# Patient Record
Sex: Male | Born: 1955
Health system: Southern US, Community
[De-identification: ages and names within clinical notes are randomized; demographics above are authoritative.]

## PROBLEM LIST (undated history)

## (undated) DIAGNOSIS — G4730 Sleep apnea, unspecified: Secondary | ICD-10-CM

## (undated) DIAGNOSIS — I2510 Atherosclerotic heart disease of native coronary artery without angina pectoris: Secondary | ICD-10-CM

## (undated) DIAGNOSIS — I10 Essential (primary) hypertension: Secondary | ICD-10-CM

## (undated) DIAGNOSIS — E119 Type 2 diabetes mellitus without complications: Secondary | ICD-10-CM

## (undated) DIAGNOSIS — Z860100 Personal history of colon polyps, unspecified: Secondary | ICD-10-CM

## (undated) DIAGNOSIS — I5020 Unspecified systolic (congestive) heart failure: Secondary | ICD-10-CM

## (undated) DIAGNOSIS — N529 Male erectile dysfunction, unspecified: Secondary | ICD-10-CM

## (undated) DIAGNOSIS — C801 Malignant (primary) neoplasm, unspecified: Secondary | ICD-10-CM

## (undated) DIAGNOSIS — I739 Peripheral vascular disease, unspecified: Secondary | ICD-10-CM

## (undated) HISTORY — PX: SHOULDER SURGERY: SHX246

## (undated) HISTORY — DX: Essential (primary) hypertension: I10

## (undated) HISTORY — DX: Type 2 diabetes mellitus without complications: E11.9

## (undated) HISTORY — DX: Peripheral vascular disease, unspecified: I73.9

## (undated) HISTORY — DX: Unspecified systolic (congestive) heart failure: I50.20

## (undated) HISTORY — DX: Personal history of colonic polyps: Z86.010

## (undated) HISTORY — DX: Male erectile dysfunction, unspecified: N52.9

## (undated) HISTORY — DX: Personal history of colon polyps, unspecified: Z86.0100

## (undated) HISTORY — DX: Atherosclerotic heart disease of native coronary artery without angina pectoris: I25.10

---

## 1987-04-01 HISTORY — PX: BACK SURGERY: SHX140

## 2006-03-31 DIAGNOSIS — I10 Essential (primary) hypertension: Secondary | ICD-10-CM

## 2006-03-31 HISTORY — DX: Essential (primary) hypertension: I10

## 2008-03-31 DIAGNOSIS — E118 Type 2 diabetes mellitus with unspecified complications: Secondary | ICD-10-CM

## 2008-03-31 DIAGNOSIS — E119 Type 2 diabetes mellitus without complications: Secondary | ICD-10-CM

## 2008-03-31 HISTORY — DX: Type 2 diabetes mellitus without complications: E11.9

## 2008-03-31 HISTORY — DX: Type 2 diabetes mellitus with unspecified complications: E11.8

## 2009-01-10 ENCOUNTER — Ambulatory Visit: Payer: Self-pay | Admitting: Internal Medicine

## 2011-04-01 HISTORY — PX: COLONOSCOPY: SHX174

## 2011-08-20 ENCOUNTER — Ambulatory Visit: Payer: Self-pay | Admitting: General Surgery

## 2011-08-21 LAB — PATHOLOGY REPORT

## 2012-01-13 ENCOUNTER — Ambulatory Visit: Payer: Self-pay | Admitting: Internal Medicine

## 2012-01-30 ENCOUNTER — Ambulatory Visit: Payer: Self-pay | Admitting: Internal Medicine

## 2012-02-29 ENCOUNTER — Ambulatory Visit: Payer: Self-pay | Admitting: Internal Medicine

## 2012-03-31 ENCOUNTER — Ambulatory Visit: Payer: Self-pay | Admitting: Internal Medicine

## 2012-10-14 ENCOUNTER — Encounter: Payer: Self-pay | Admitting: *Deleted

## 2014-07-19 ENCOUNTER — Ambulatory Visit
Admit: 2014-07-19 | Disposition: A | Discharge status: Home / Self Care | Payer: Self-pay | Attending: Family Medicine | Admitting: Family Medicine

## 2014-07-19 LAB — URINALYSIS, COMPLETE
BILIRUBIN, UR: NEGATIVE
BLOOD: NEGATIVE
Bacteria: NEGATIVE
Glucose,UR: 500
Ketone: NEGATIVE
Leukocyte Esterase: NEGATIVE
Nitrite: NEGATIVE
Ph: 5.5 (ref 5.0–8.0)
Protein: NEGATIVE
RBC,UR: NONE SEEN /HPF (ref 0–5)
SQUAMOUS EPITHELIAL: NONE SEEN
Specific Gravity: 1.015 (ref 1.000–1.030)

## 2014-07-19 IMAGING — CR BILATERAL SACROILIAC JOINTS - 3+ VIEW
3 series · 3 of 3 positions shown · non-contrast
Comparison: None.

CLINICAL DATA: Right posterior pelvic pain for 6 weeks, no injury

EXAM:
BILATERAL SACROILIAC JOINTS - 3+ VIEW

[si joints ap]
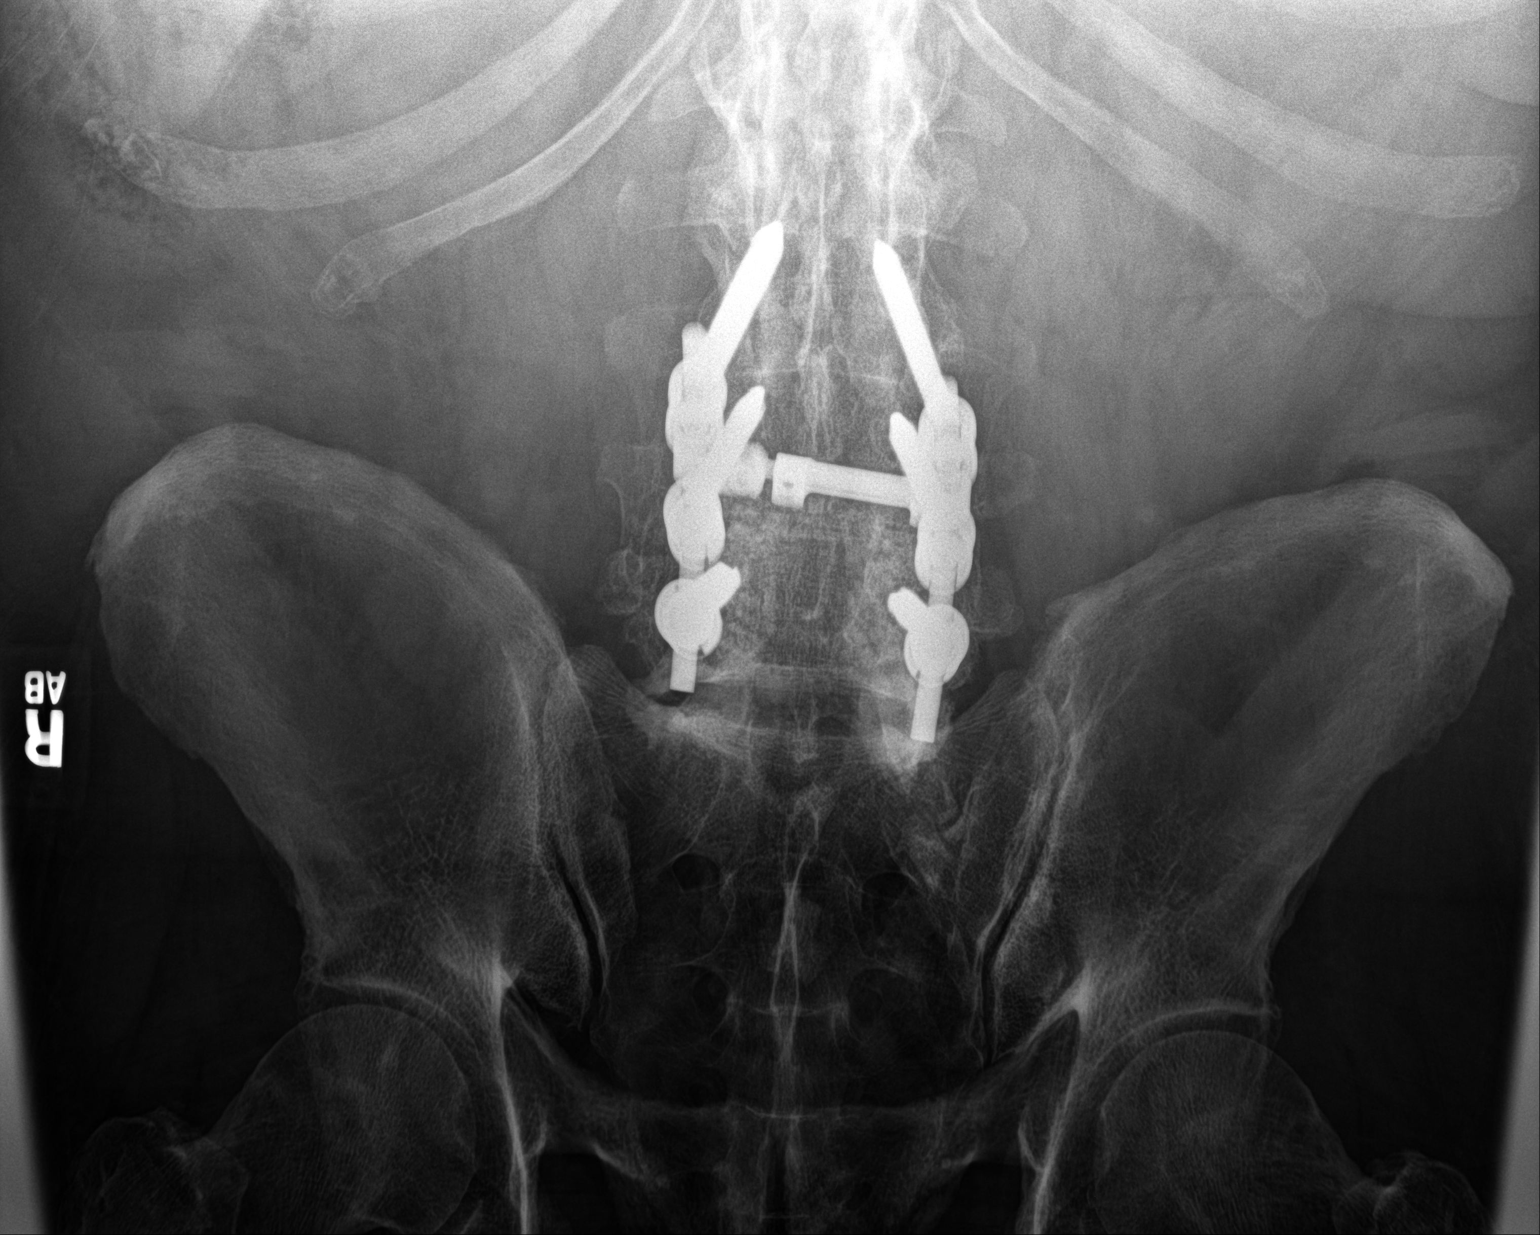

[si joints obli (1 of 2)]
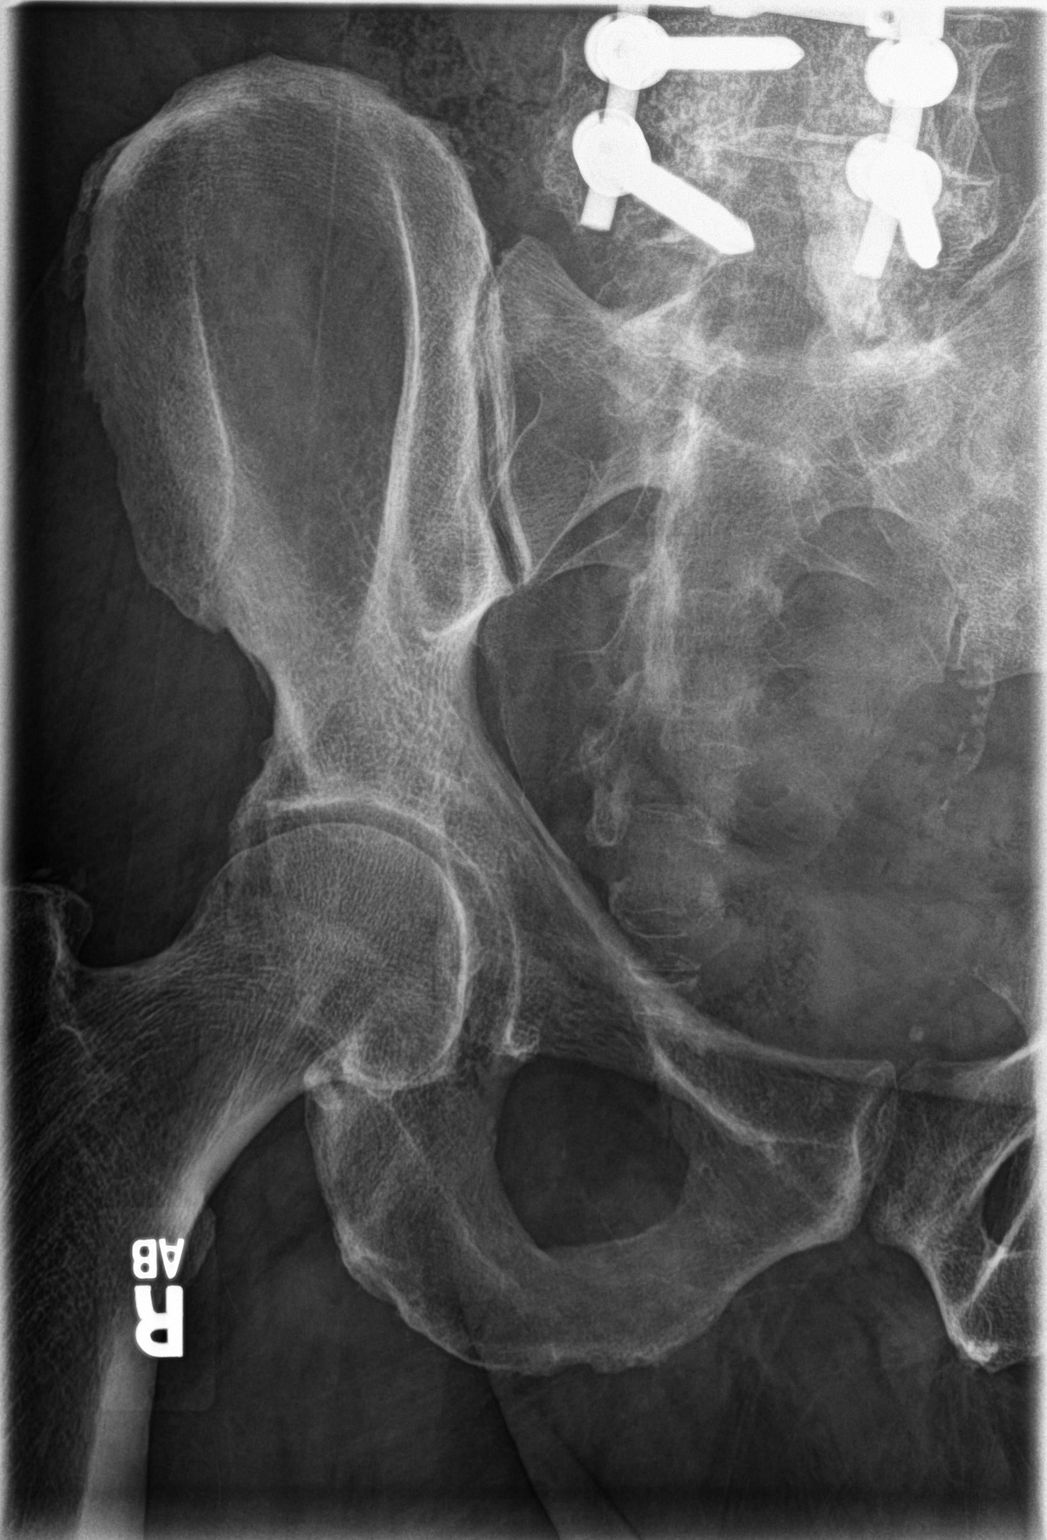

[si joints obli (2 of 2)]
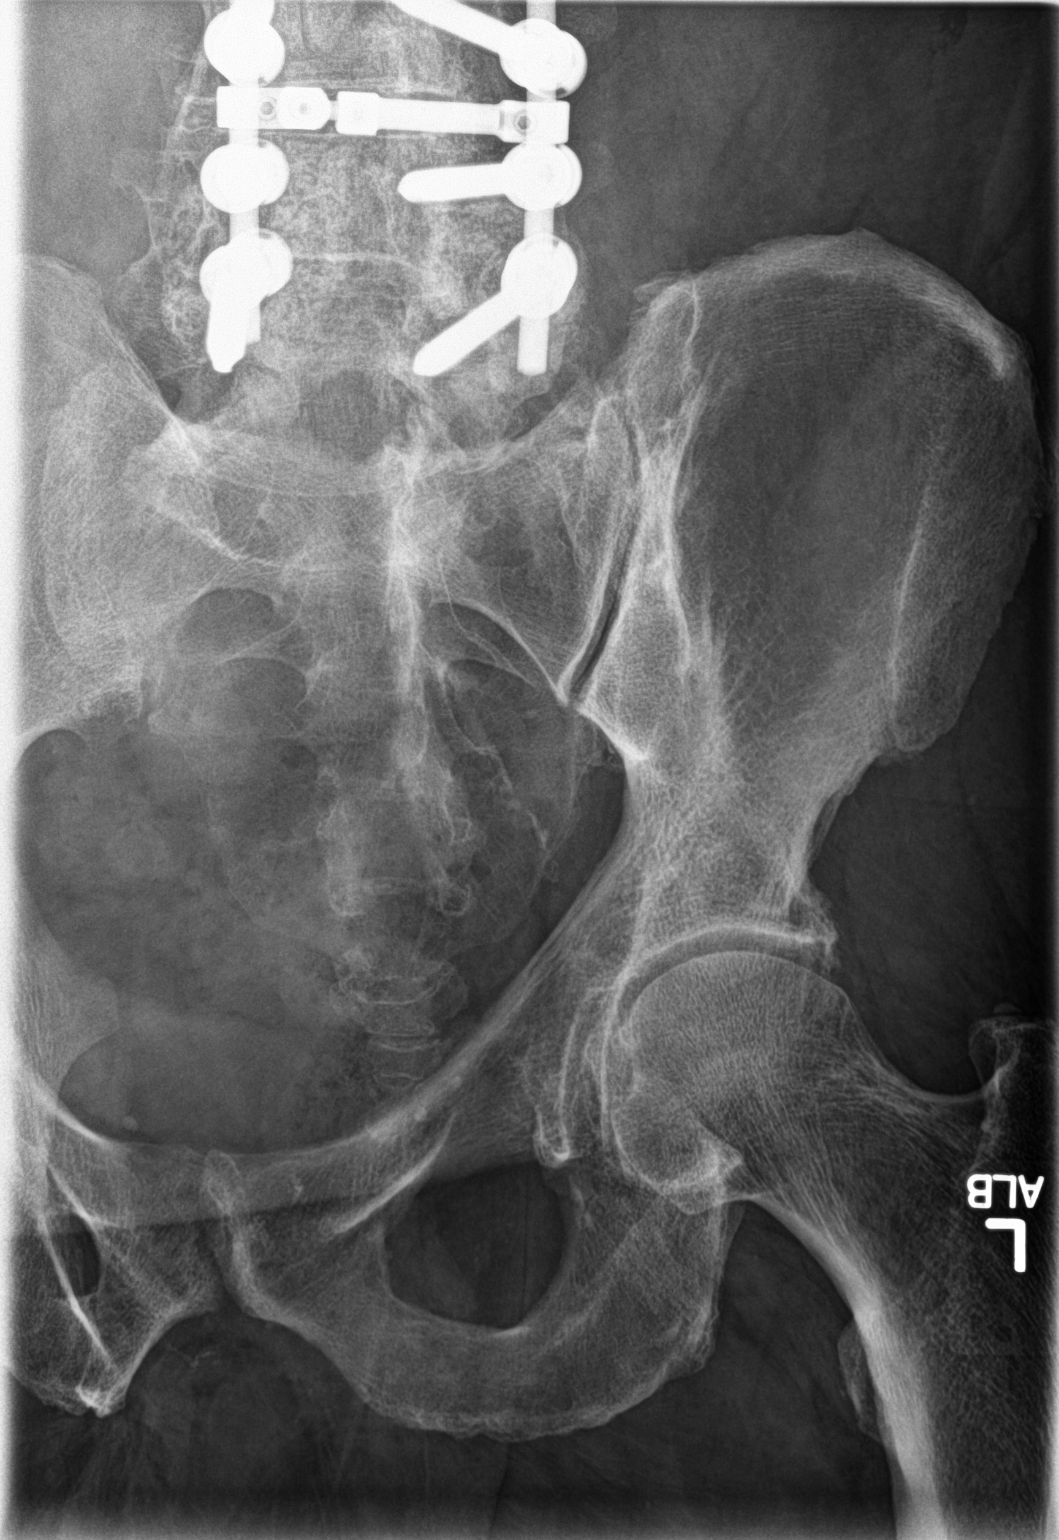

[3 of 3 positions shown; findings below may reference images not displayed]

FINDINGS: Views of the SI joints show no evidence of sacroiliitis. Minimal
degenerative changes present with some vacuum disc phenomenon. The
sacral foramina appear corticated. No acute abnormality is seen.
Hardware for posterior fusion of L3-L5 is noted.
IMPRESSION: No evidence of sacroiliitis. Mild degenerative change of the SI
joints.

## 2014-07-19 IMAGING — CR PELVIS - 1-2 VIEW
1 series · 1 of 1 positions shown · non-contrast
Comparison: None.

CLINICAL DATA: Right posterior pelvis pain for 6 weeks common no
known injury, initial encounter

EXAM:
PELVIS - 1-2 VIEW

[pelvis ap]
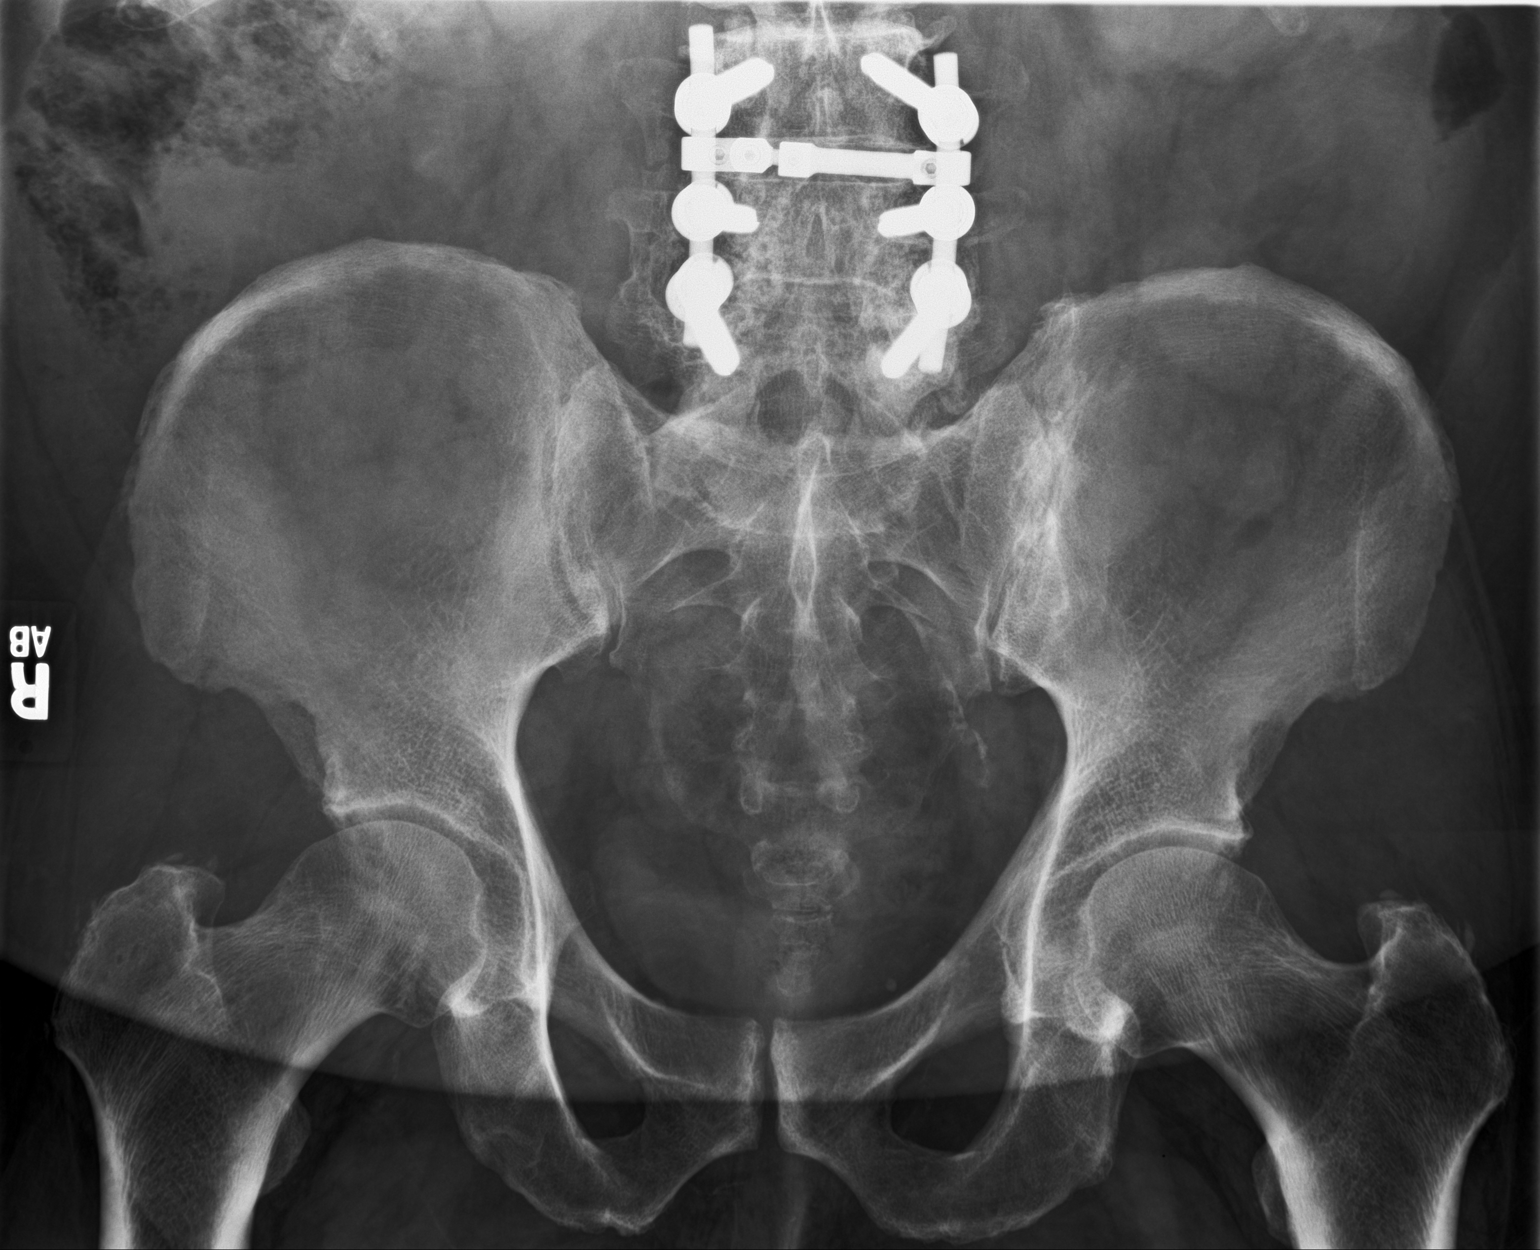

[1 of 1 positions shown; findings below may reference images not displayed]

FINDINGS: Pelvic ring is intact. Postsurgical changes are noted in the lower
lumbar spine. No acute fracture or dislocation is noted.
IMPRESSION: No acute abnormality noted.

## 2014-07-26 ENCOUNTER — Ambulatory Visit
Admit: 2014-07-26 | Disposition: A | Discharge status: Home / Self Care | Payer: Self-pay | Attending: Internal Medicine | Admitting: Internal Medicine

## 2014-07-26 IMAGING — US ABDOMEN ULTRASOUND
1 series · 13 of 25 positions shown · non-contrast
Comparison: None.

CLINICAL DATA: Six week history of right-sided abdominal
discomfort; no history of abdominal surgery.

EXAM:
ULTRASOUND ABDOMEN COMPLETE

[Series 1: abdomen ultrasound · 0.31mm/px · 13 of 174 slices shown]
[im 1/174]
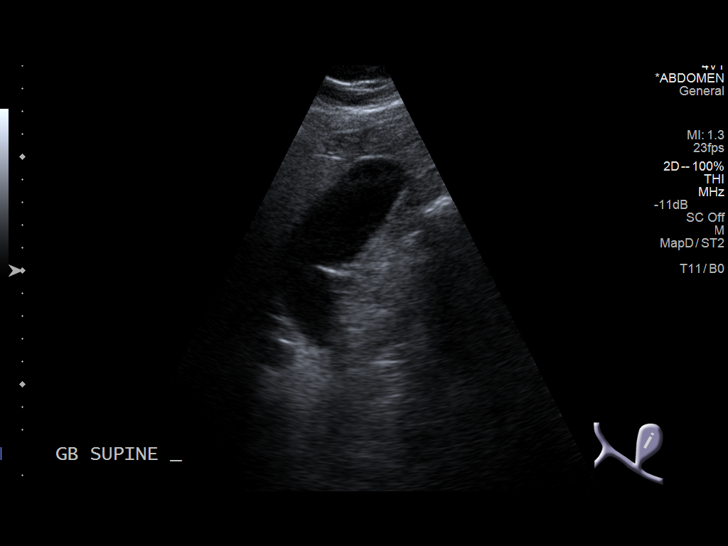
[im 15/174]
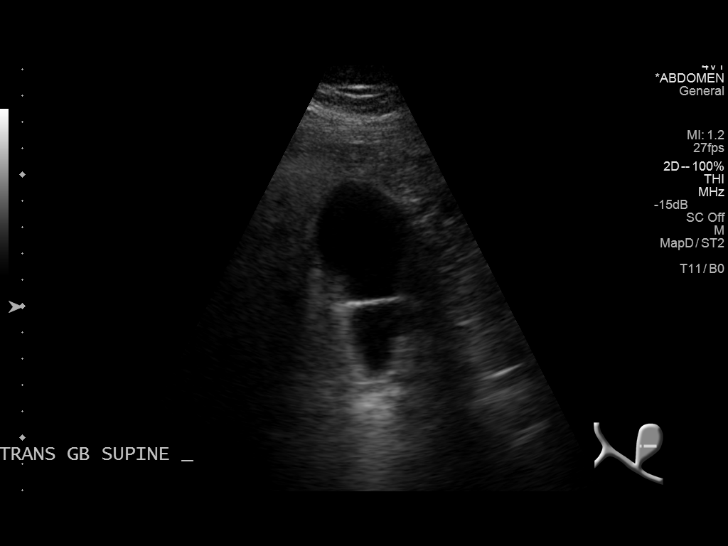
[im 29/174]
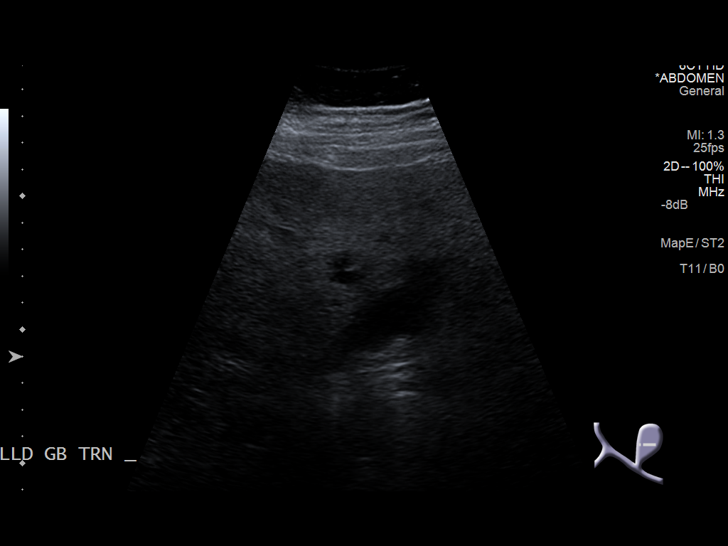
[im 44/174]
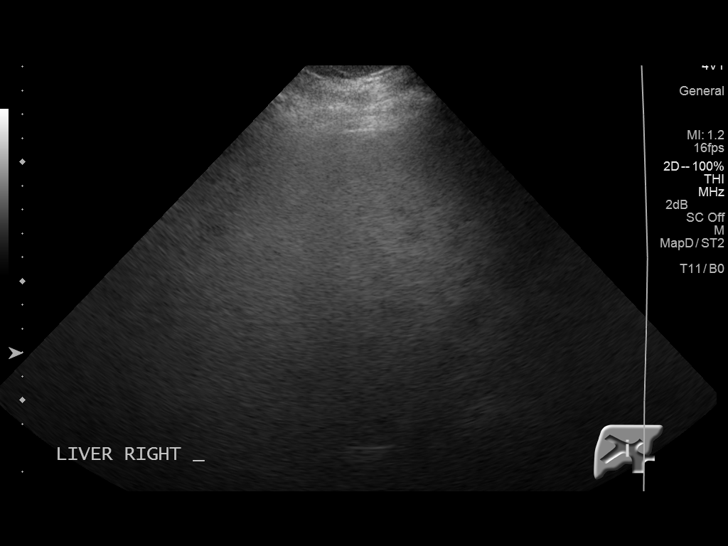
[im 58/174]
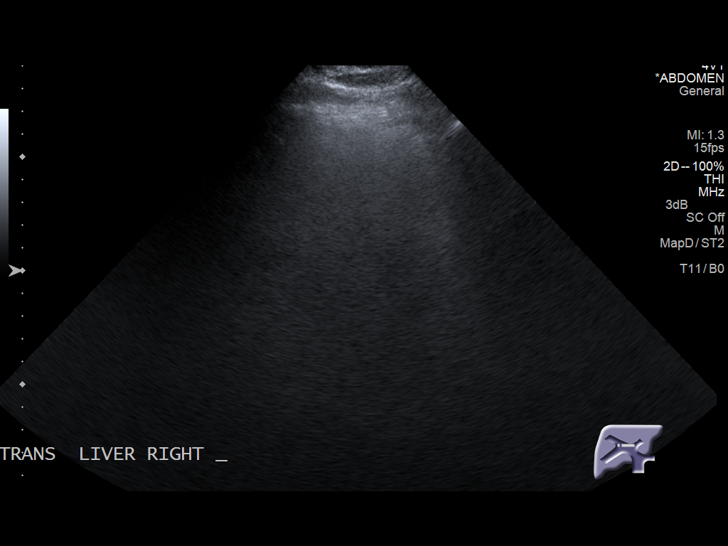
[im 73/174]
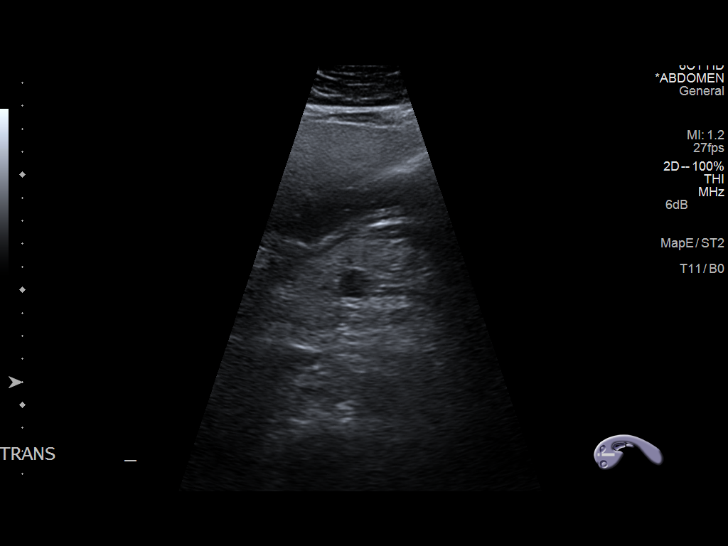
[im 87/174]
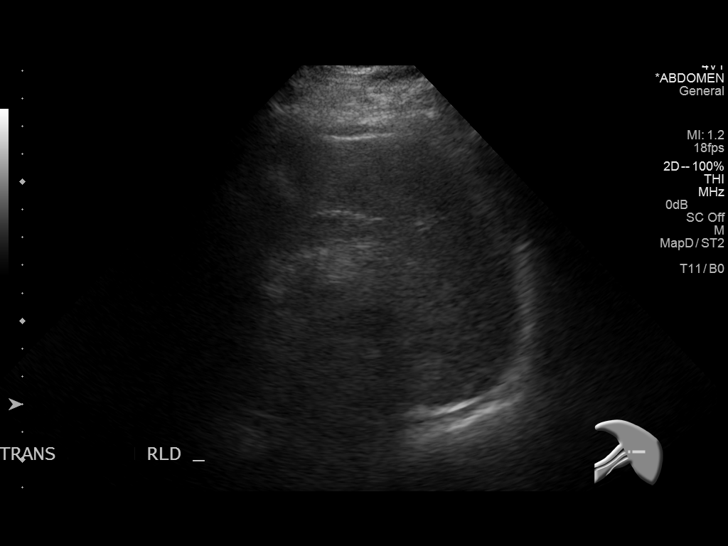
[im 101/174]
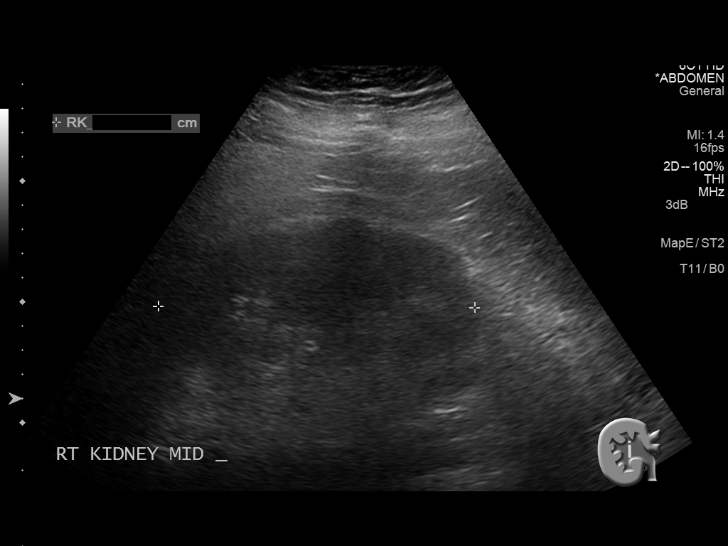
[im 116/174]
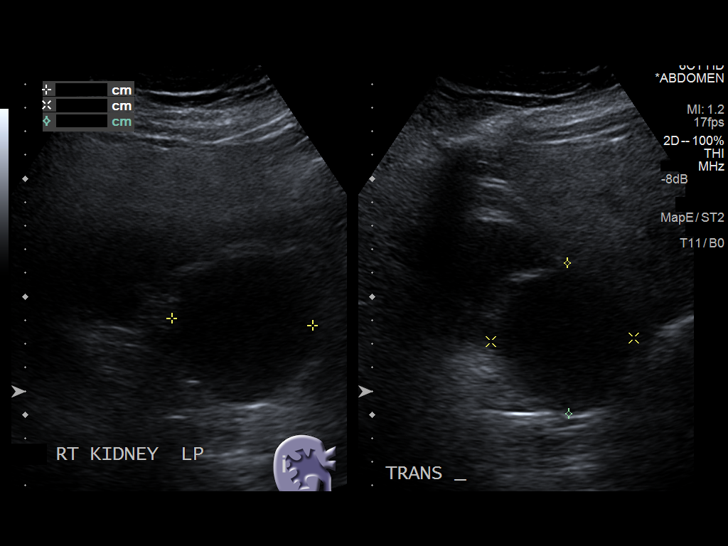
[im 130/174]
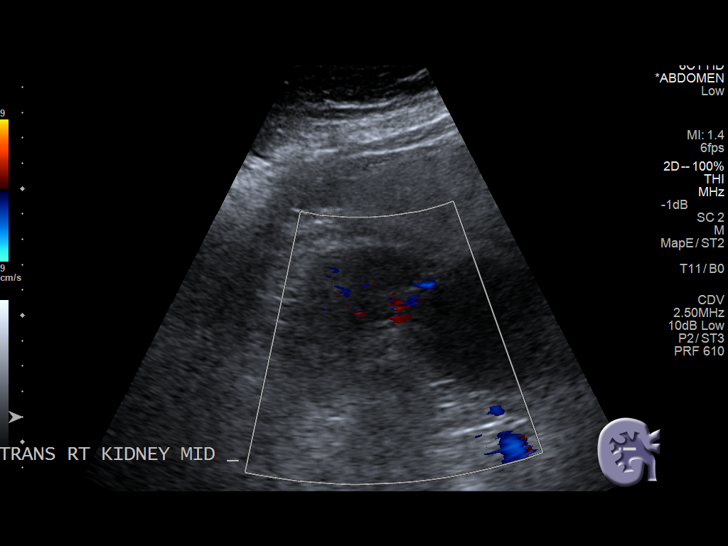
[im 145/174]
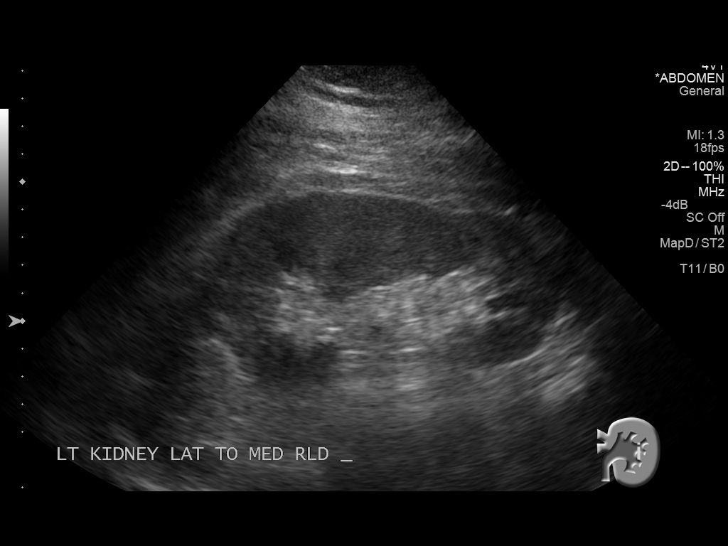
[im 159/174]
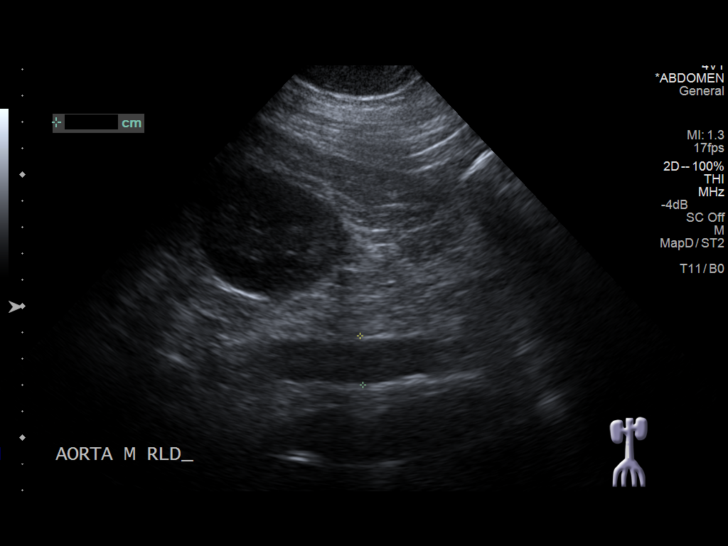
[im 174/174]
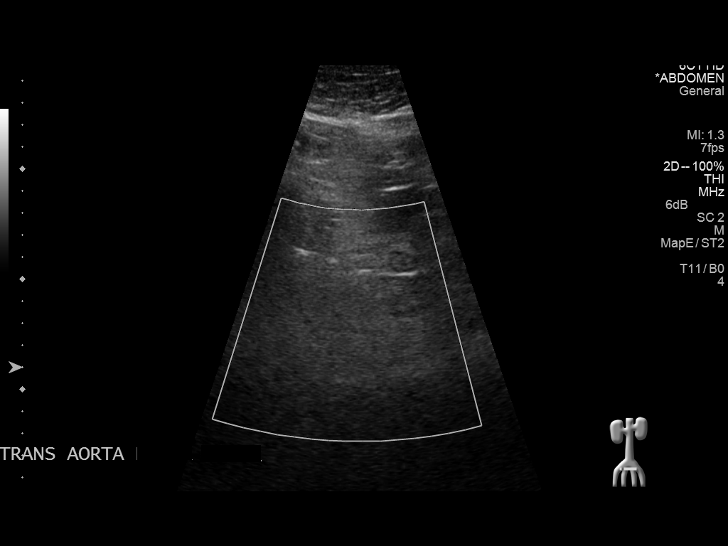

[13 of 25 positions shown; findings below may reference images not displayed]

FINDINGS: Gallbladder: No gallstones or wall thickening visualized. No
sonographic Murphy sign noted.

Common bile duct: Diameter: 5.4 mm

Liver: The hepatic echotexture is mildly increased. Is the organ is
difficult to penetrate with the ultrasound beam. No definite mass or
ductal dilation is demonstrated.

IVC: The inferior vena cava was obscured by bowel gas.

Pancreas: The pancreas exhibits normal echotexture with no focal
mass or ductal dilation.

Spleen: Size and appearance within normal limits.

Right Kidney: Length: 13.1 cm. The renal cortical echotexture is
normal. There is a 6.4 cm simple appearing cyst exophytic from the
lower pole.

Left Kidney: Length: 12.2 cm. Echogenicity within normal limits. No
mass or hydronephrosis visualized.

Abdominal aorta: No aneurysm visualized.

Other findings: There is no ascites.
IMPRESSION: 1. No acute hepatobiliary abnormality is demonstrated. Evaluation of
the liver is limited however due to its dense echotexture and the
patient's body habitus.
2. Simple appearing 6.4 cm exophytic lower pole cyst on the right.
No acute renal abnormality is demonstrated.

## 2014-08-04 ENCOUNTER — Other Ambulatory Visit: Payer: Self-pay | Admitting: Internal Medicine

## 2014-08-04 DIAGNOSIS — R109 Unspecified abdominal pain: Secondary | ICD-10-CM

## 2014-08-11 ENCOUNTER — Ambulatory Visit
Admission: RE | Admit: 2014-08-11 | Discharge: 2014-08-11 | Disposition: A | Discharge status: Home / Self Care | Payer: BLUE CROSS/BLUE SHIELD | Source: Ambulatory Visit | Attending: Internal Medicine | Admitting: Internal Medicine

## 2014-08-11 DIAGNOSIS — R109 Unspecified abdominal pain: Secondary | ICD-10-CM | POA: Diagnosis present

## 2014-08-11 DIAGNOSIS — K5730 Diverticulosis of large intestine without perforation or abscess without bleeding: Secondary | ICD-10-CM | POA: Insufficient documentation

## 2014-08-11 DIAGNOSIS — D1779 Benign lipomatous neoplasm of other sites: Secondary | ICD-10-CM | POA: Insufficient documentation

## 2014-08-11 IMAGING — CT CT ABD-PEL WO/W CM
1 of 4 series · 11 of 32 positions shown, 17 images · IV contrast (omnipaque)
Comparison: None

CLINICAL DATA: RIGHT-side abdominal pain, history diabetes
mellitus, hypertension, former smoker

EXAM:
CT ABDOMEN AND PELVIS WITHOUT AND WITH CONTRAST
TECHNIQUE: Multidetector CT imaging of the abdomen and pelvis was performed
following the standard protocol before and following the bolus
administration of intravenous contrast. Sagittal and coronal MPR
images reconstructed from axial data set.
CONTRAST:  100mL OMNIPAQUE IOHEXOL 300 MG/ML SOLN IV. Dilute oral
contrast.

[Series 2: routine abd pel without · axial · non-contrast · 0.85mm/px · z∈[+331,+731]mm · 11 of 98 slices shown, 17 images]
[im 9/98  soft-tissue]
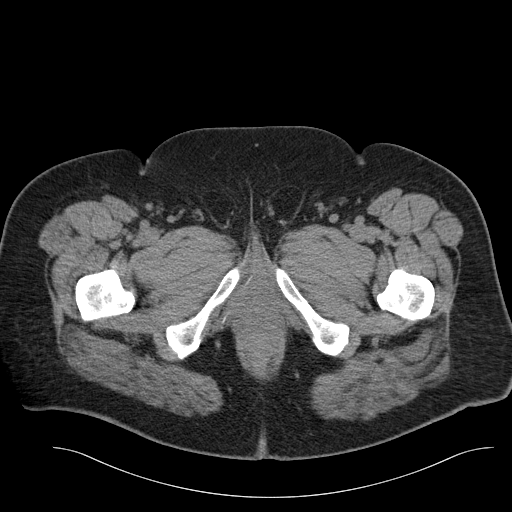
[im 9/98  bone]
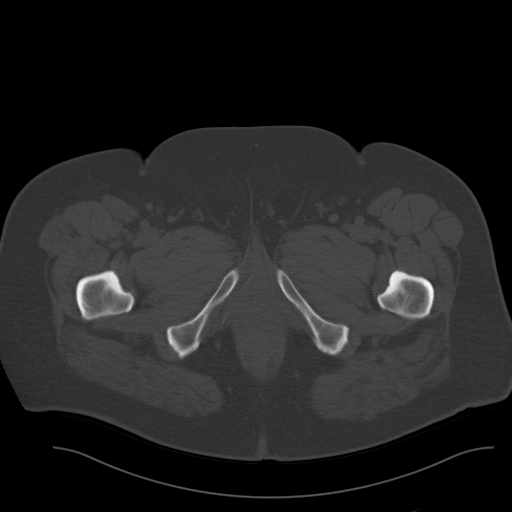
[im 17/98  soft-tissue]
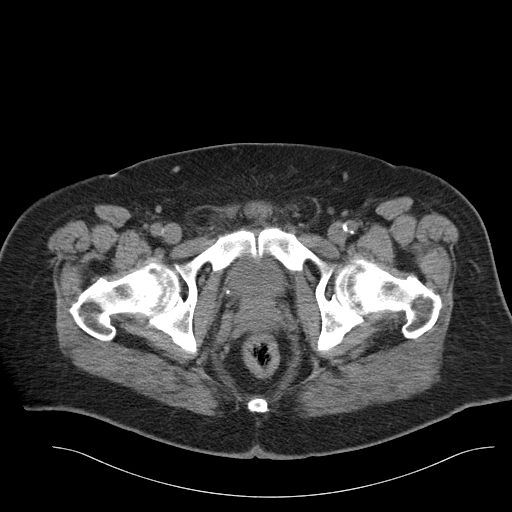
[im 25/98  soft-tissue]
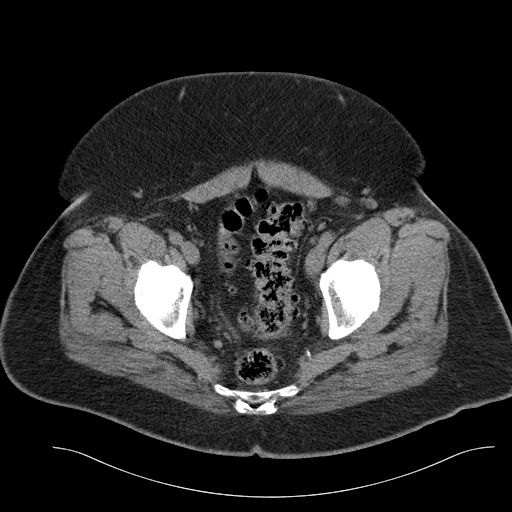
[im 33/98  soft-tissue]
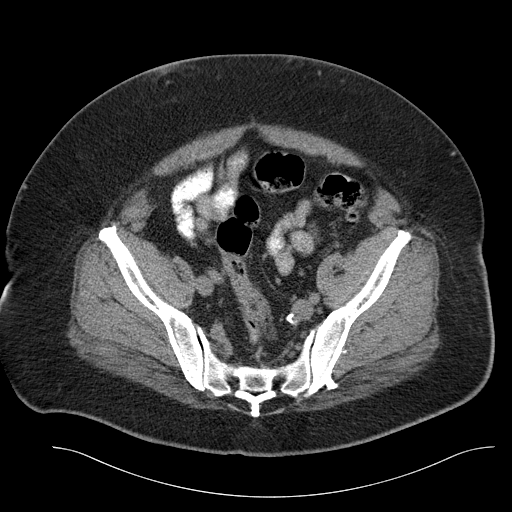
[im 41/98  soft-tissue]
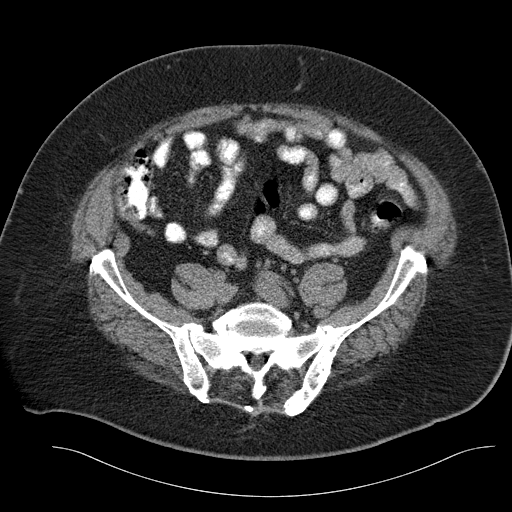
[im 49/98  soft-tissue]
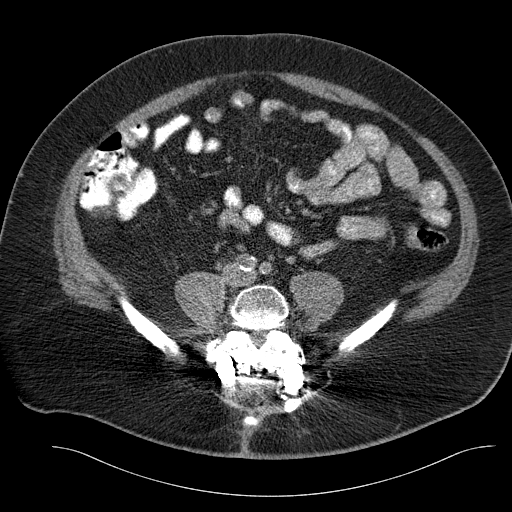
[im 57/98  soft-tissue]
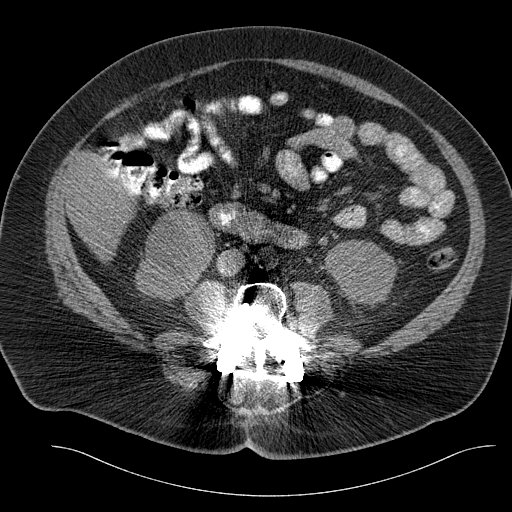
[im 65/98  soft-tissue]
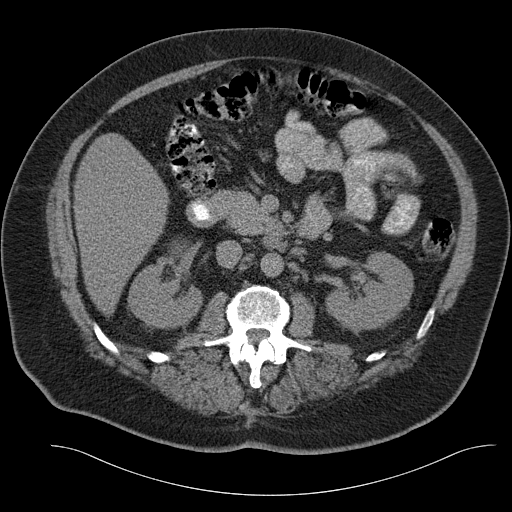
[im 65/98  lung]
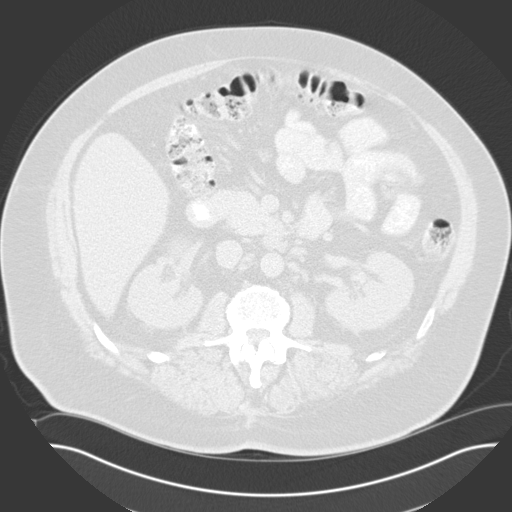
[im 73/98  soft-tissue]
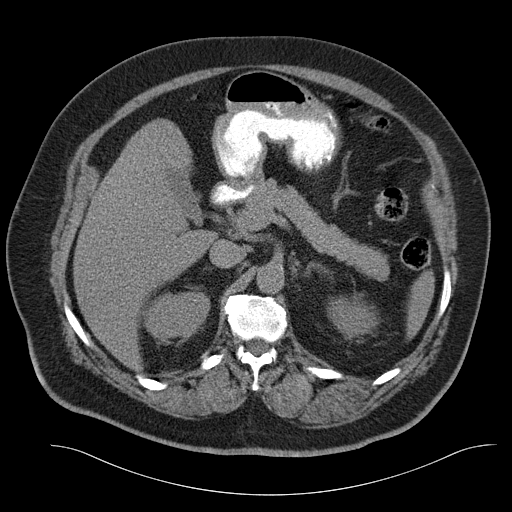
[im 73/98  lung]
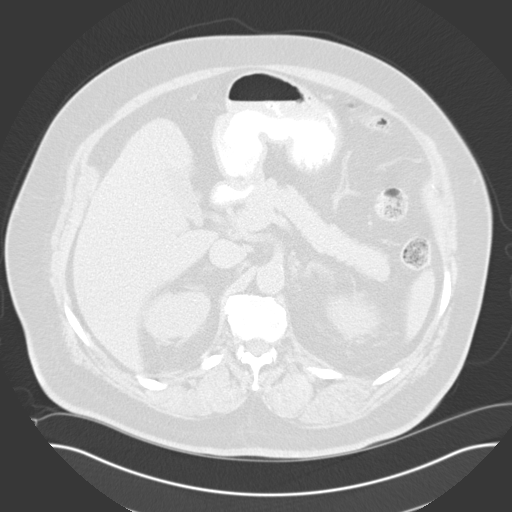
[im 73/98  bone]
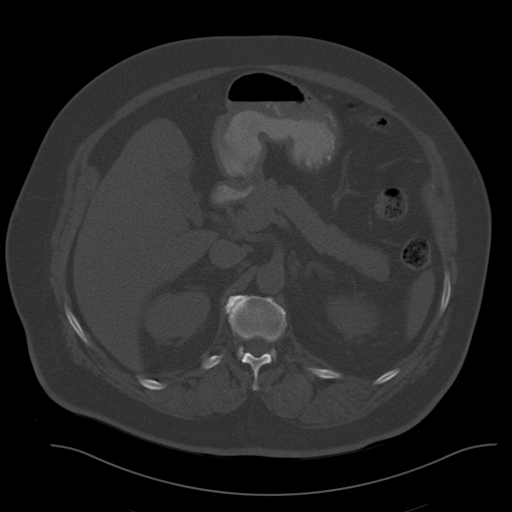
[im 81/98  soft-tissue]
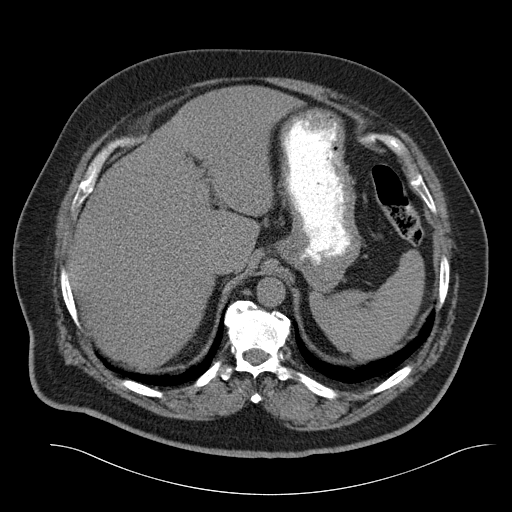
[im 81/98  lung]
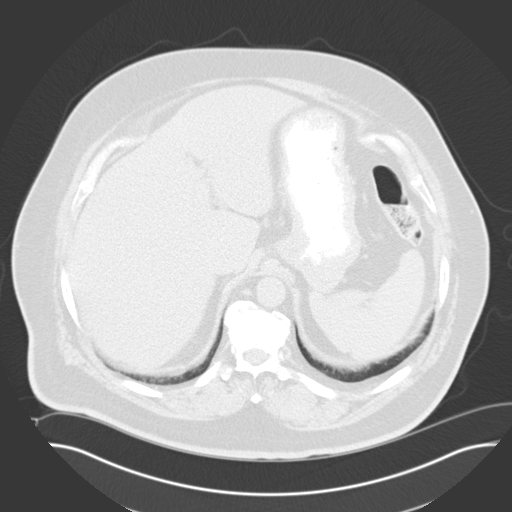
[im 89/98  soft-tissue]
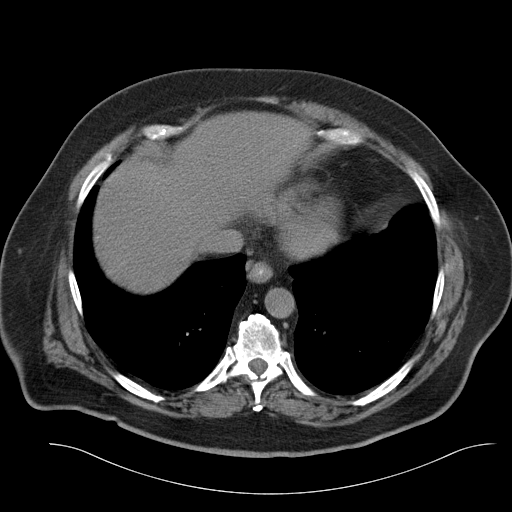
[im 89/98  lung]
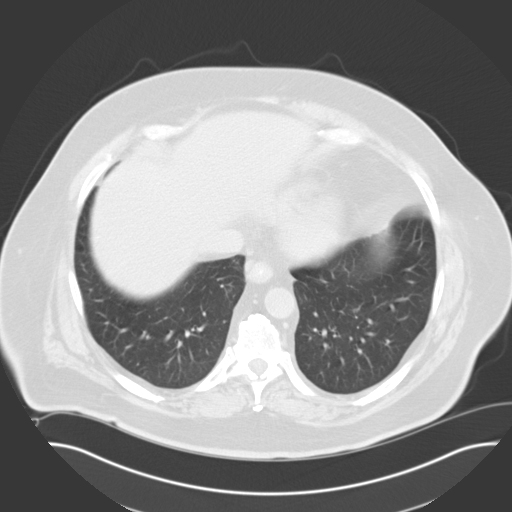

[11 of 32 positions shown; findings below may reference images not displayed]

FINDINGS: Lung bases clear.

Beam hardening artifacts from pedicle screws and bars at L3-L5.

Simple appearing cyst lower pole RIGHT kidney 6.2 x 6.2 x 6.0 cm.

Questionable tiny cyst inferior pole LEFT kidney series 7, image 27.

No urinary tract calcification or dilatation.

Small fat attenuation nodule LEFT adrenal gland consistent with
myelolipoma 12 x 11 x 8 mm.

Liver, gallbladder, spleen, pancreas, kidneys, and adrenal glands
otherwise normal.

Normal appendix.

Sigmoid diverticulosis without evidence of diverticulitis.

Stomach and bowel loops otherwise normal appearance.

Scattered atherosclerotic calcification.

No addition mass, adenopathy, free fluid, or free air.

Bladder and ureters unremarkable.

Probable small BILATERAL inguinal hernias containing fat.

Deformity of the posterior LEFT ilium question bone graft donor
site.

No acute osseous findings.
IMPRESSION: Small LEFT adrenal myelolipoma.

Probable BILATERAL renal cysts.

Sigmoid diverticulosis.

Probable BILATERAL small inguinal hernias containing fat.

No acute intra-abdominal or intrapelvic abnormalities.

## 2014-08-11 MED ORDER — IOHEXOL 300 MG/ML  SOLN
100.0000 mL | Freq: Once | INTRAMUSCULAR | Status: AC | PRN
  Administered 2014-08-11: 100 mL via INTRAVENOUS

## 2015-03-28 ENCOUNTER — Ambulatory Visit
Admission: RE | Admit: 2015-03-28 | Discharge: 2015-03-28 | Disposition: A | Discharge status: Home / Self Care | Payer: BLUE CROSS/BLUE SHIELD | Source: Ambulatory Visit | Attending: Internal Medicine | Admitting: Internal Medicine

## 2015-03-28 ENCOUNTER — Other Ambulatory Visit: Payer: Self-pay | Admitting: Internal Medicine

## 2015-03-28 DIAGNOSIS — J181 Lobar pneumonia, unspecified organism: Principal | ICD-10-CM

## 2015-03-28 DIAGNOSIS — J189 Pneumonia, unspecified organism: Secondary | ICD-10-CM | POA: Insufficient documentation

## 2015-03-28 IMAGING — CR DG CHEST 2V
1 series · 2 of 2 positions shown · non-contrast
Comparison: None.

CLINICAL DATA: Chest pressure.

EXAM:
CHEST  2 VIEW

[Series 1: pa · 0.17mm/px · 2 of 2 slices shown]
[im 1/2]
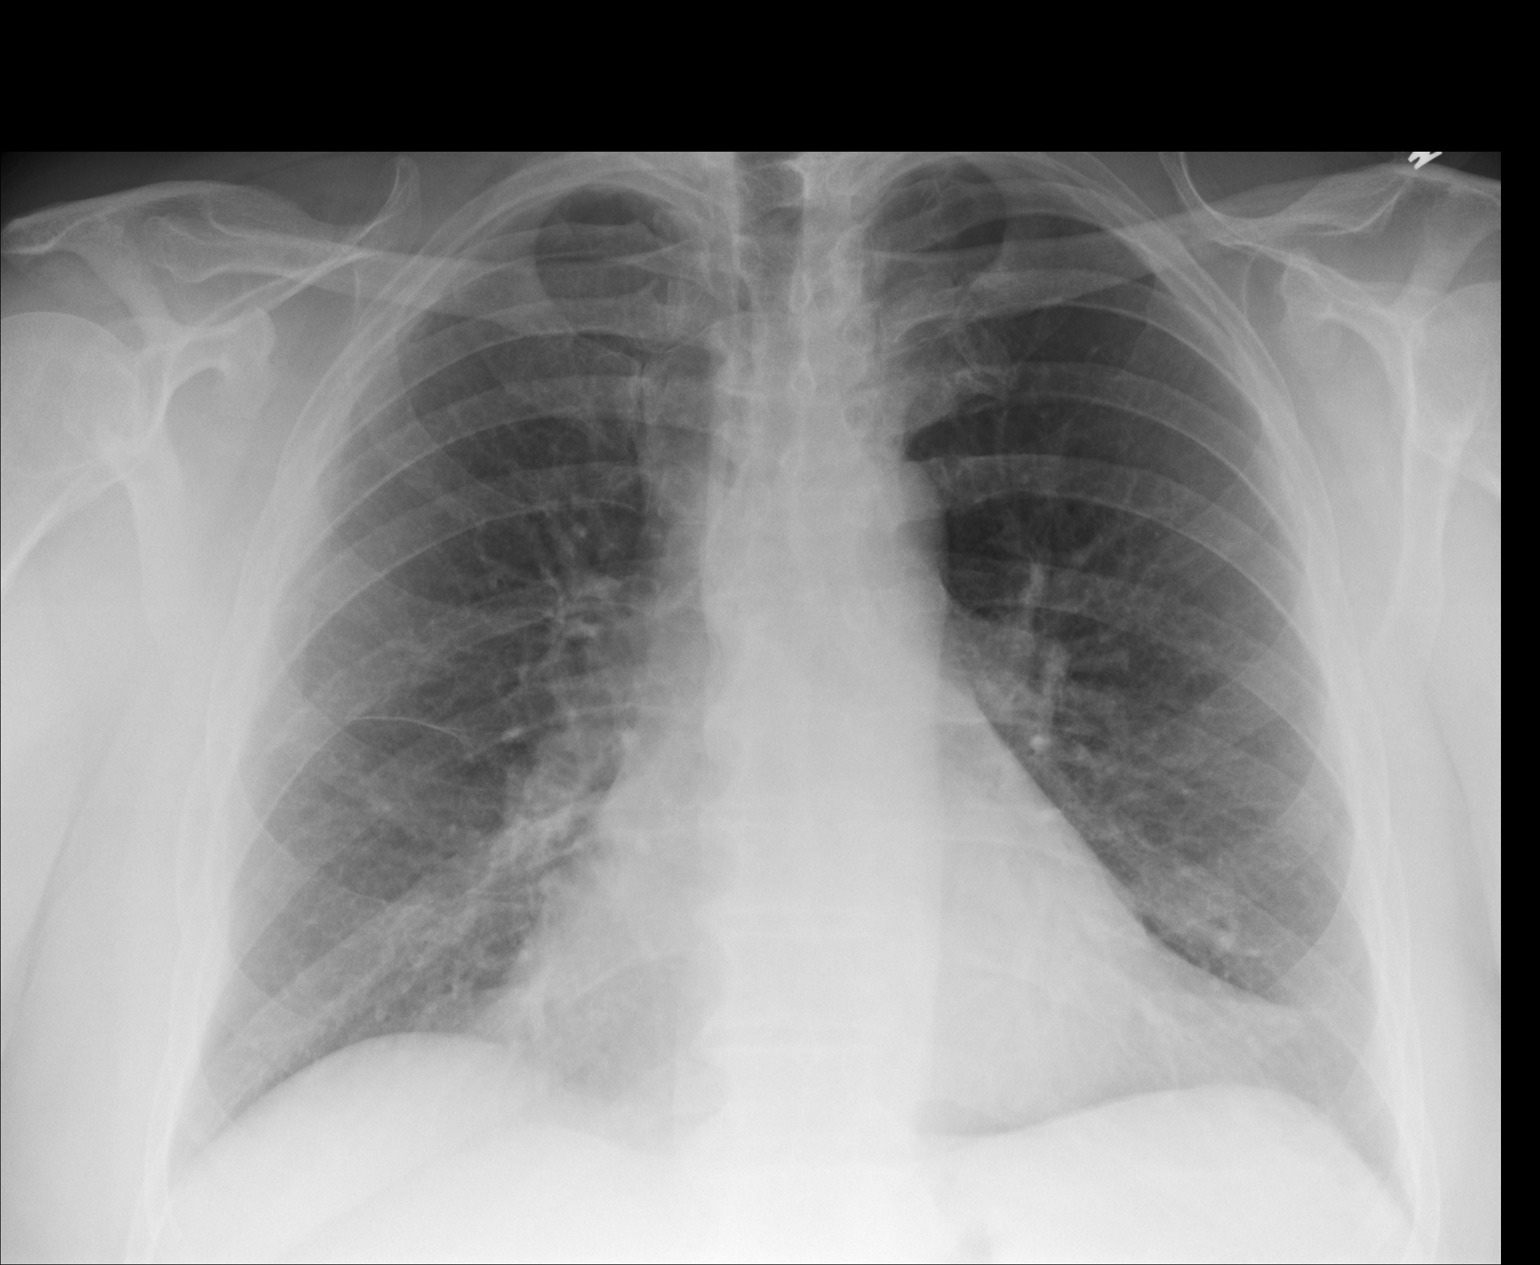
[im 2/2]
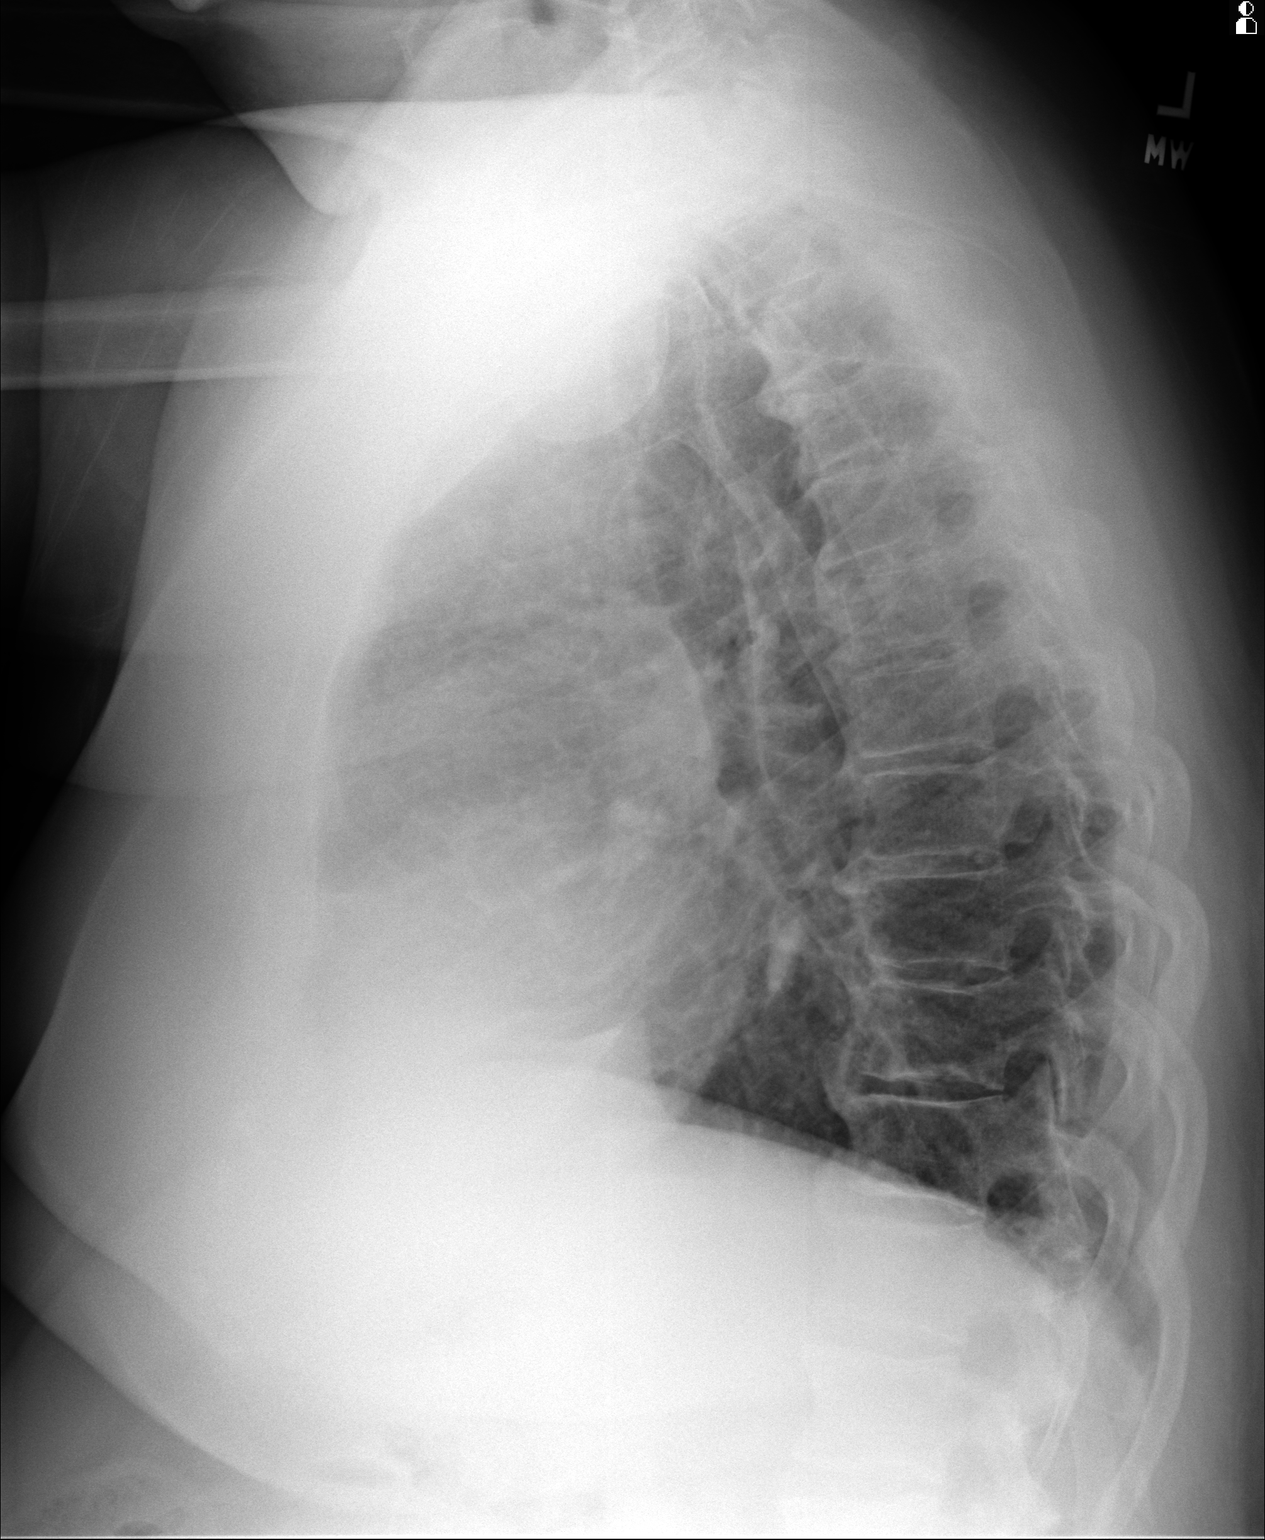

[2 of 2 positions shown; findings below may reference images not displayed]

FINDINGS: Mediastinum and hilar structures normal. Low lung volumes with
bibasilar atelectasis. No pleural effusion or pneumothorax.
Cardiomegaly with normal pulmonary vascularity. No acute bony
abnormality .
IMPRESSION: 1.  Low lung volumes with bibasilar atelectasis .

2.  Cardiomegaly with normal pulmonary vascularity.

## 2016-02-20 ENCOUNTER — Ambulatory Visit
Admission: RE | Admit: 2016-02-20 | Discharge: 2016-02-20 | Disposition: A | Discharge status: Home / Self Care | Payer: 59 | Source: Ambulatory Visit | Attending: Internal Medicine | Admitting: Internal Medicine

## 2016-02-20 ENCOUNTER — Other Ambulatory Visit: Payer: Self-pay | Admitting: Internal Medicine

## 2016-02-20 DIAGNOSIS — M5136 Other intervertebral disc degeneration, lumbar region: Secondary | ICD-10-CM | POA: Insufficient documentation

## 2016-02-20 IMAGING — DX DG LUMBAR SPINE COMPLETE 4+V
6 series · 6 of 6 positions shown · non-contrast
Comparison: [DATE]

CLINICAL DATA: Degenerative disc disease, low back pain for years

EXAM:
LUMBAR SPINE - COMPLETE 4+ VIEW

[l-spine ap]
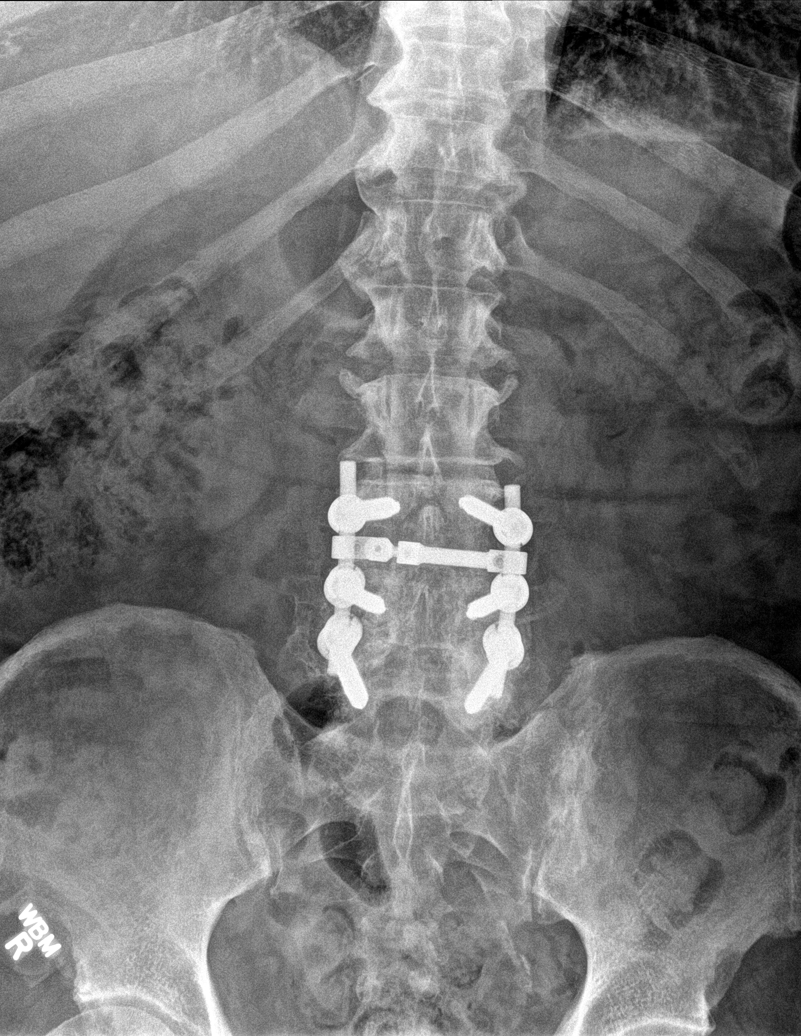

[l-spine obl (1 of 3)]
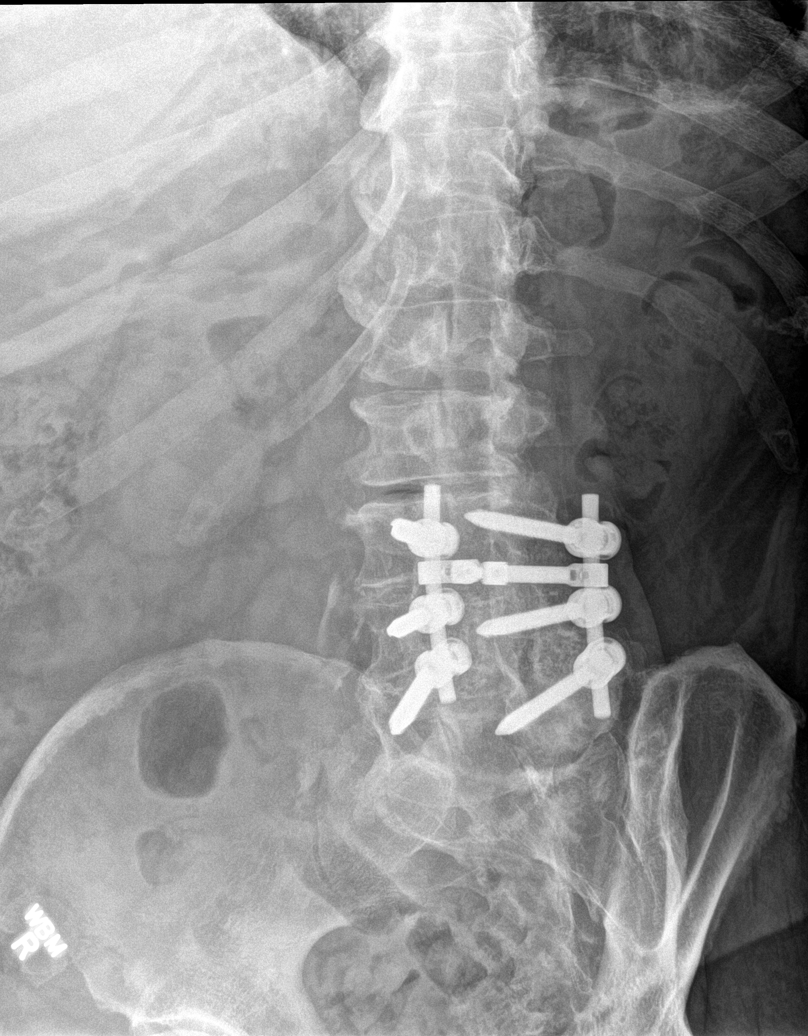

[l-spine lat]
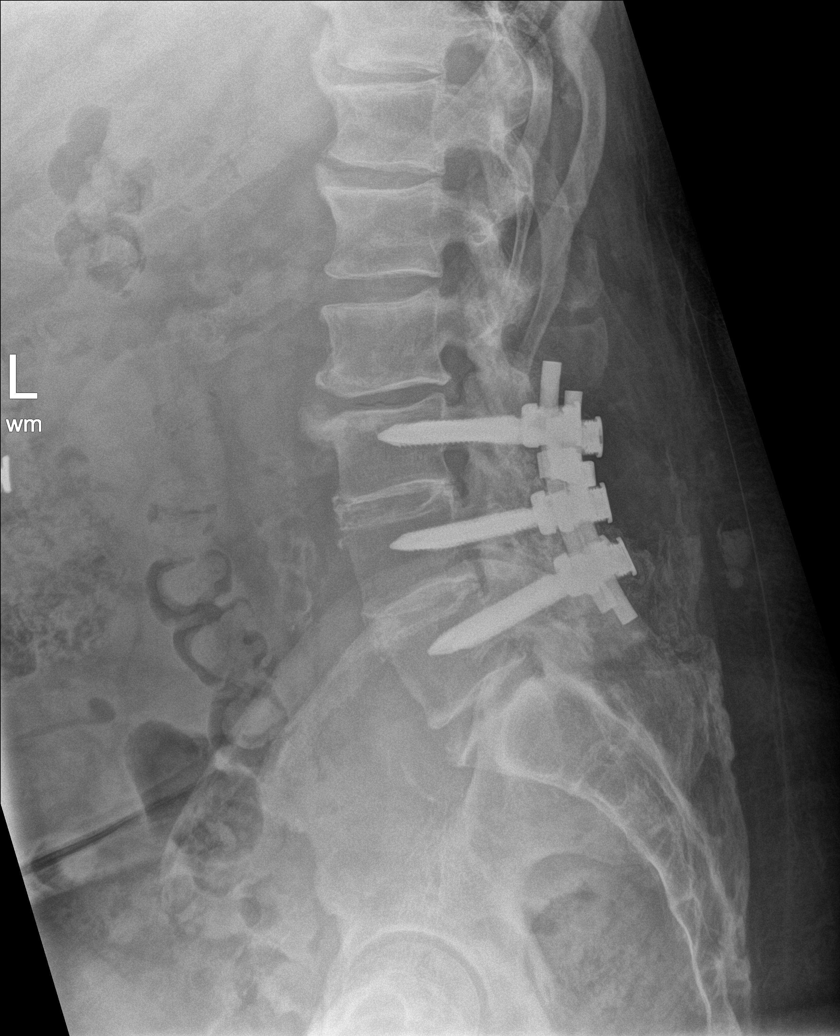

[l-spine spot]
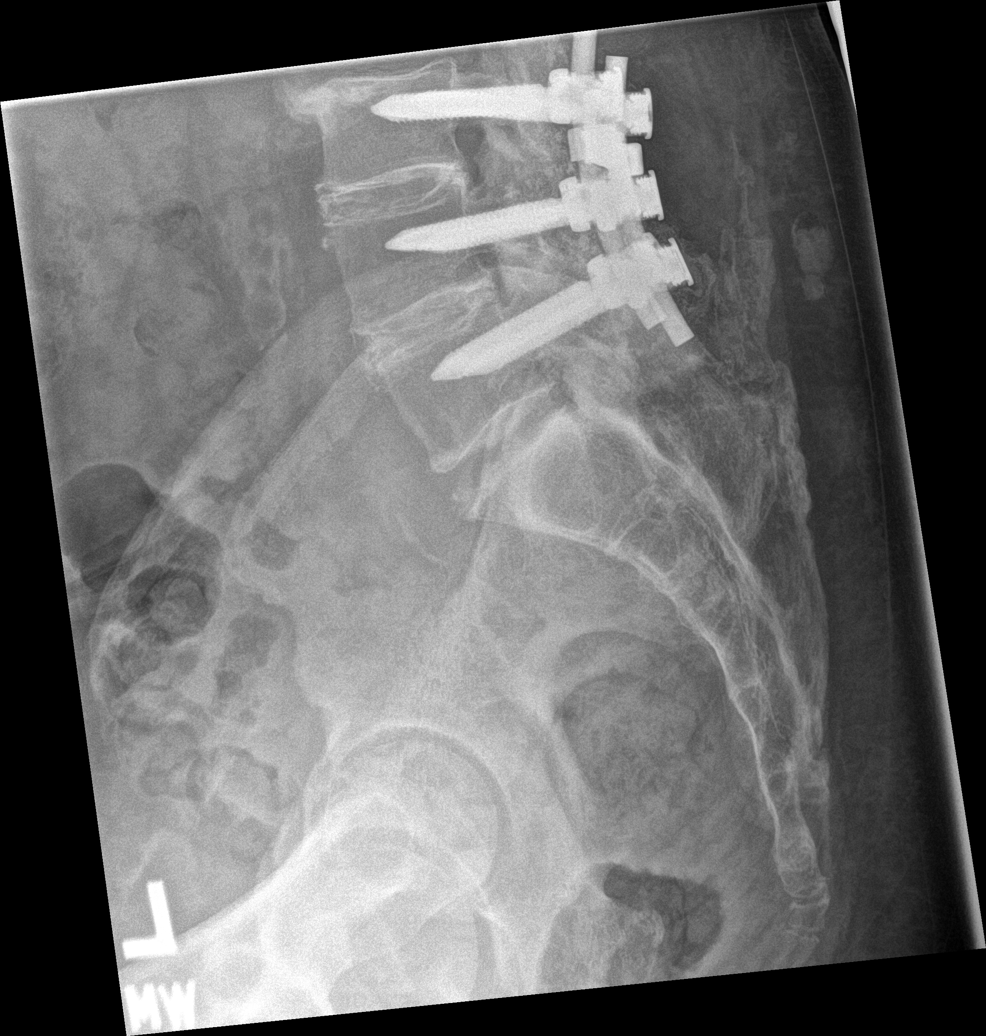

[l-spine obl (2 of 3)]
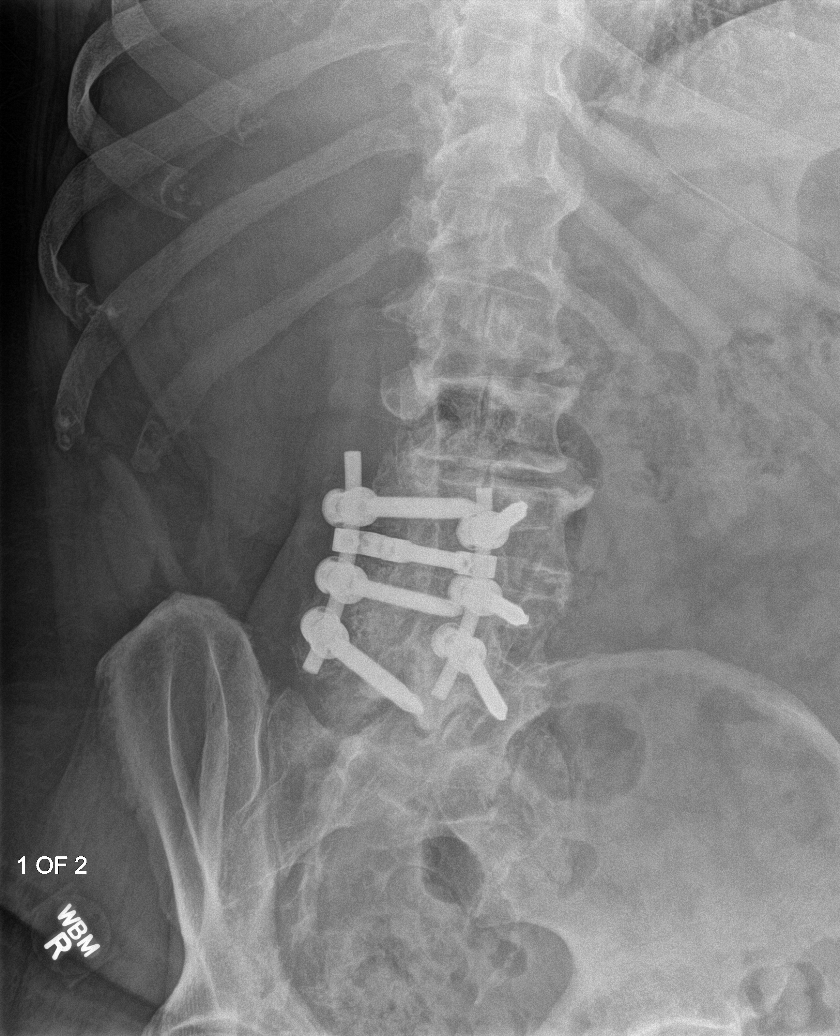

[l-spine obl (3 of 3)]
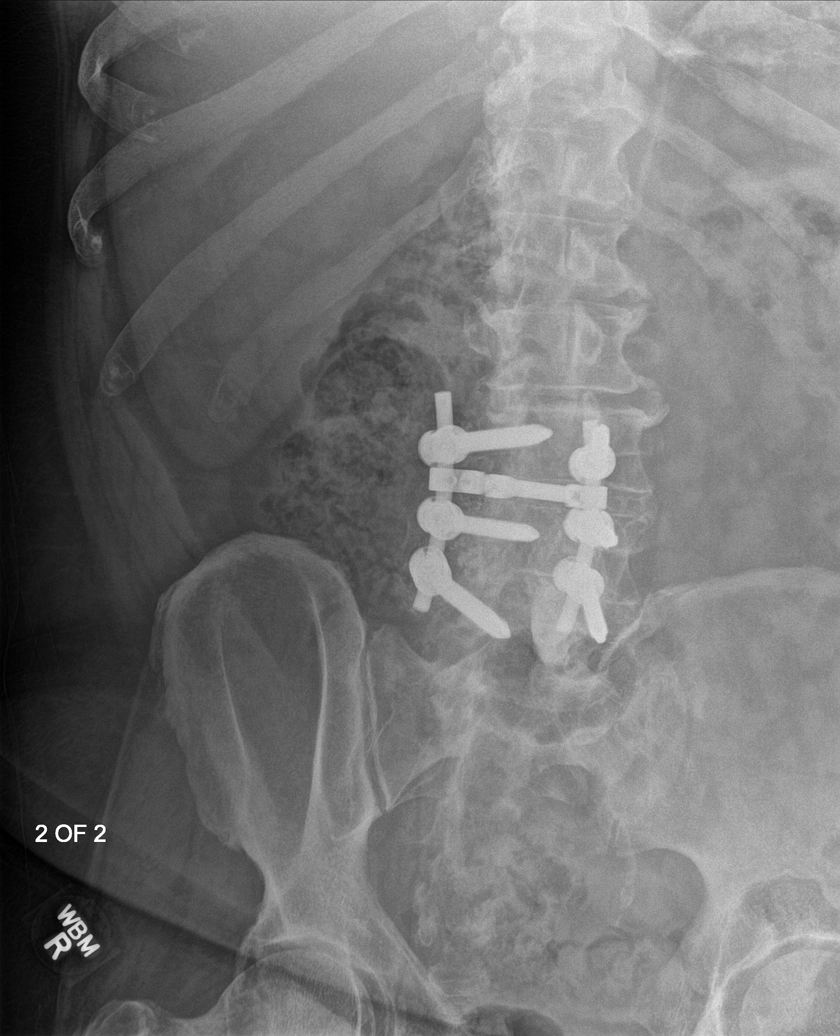

[6 of 6 positions shown; findings below may reference images not displayed]

FINDINGS: Five views of the lumbar spine submitted. No acute fracture or
subluxation. There is posterior fusion with transpedicular screws
and metallic rods at L3, L4 and L5 level. There is anatomic
alignment. Moderate disc space flattening with anterior spurring at
L2-L3 level. Mild disc space flattening at L5-S1 level. Anterior
spurring noted upper endplate of T12 and L1 vertebral bodies.
IMPRESSION: No acute fracture or subluxation. Posterior metallic fusion L3, L4
and L5 level. Degenerative changes as described above.

## 2016-10-03 ENCOUNTER — Encounter: Payer: Self-pay | Admitting: *Deleted

## 2016-10-08 ENCOUNTER — Encounter: Payer: Self-pay | Admitting: General Surgery

## 2016-10-08 ENCOUNTER — Ambulatory Visit (INDEPENDENT_AMBULATORY_CARE_PROVIDER_SITE_OTHER): Payer: BLUE CROSS/BLUE SHIELD | Admitting: General Surgery

## 2016-10-08 VITALS — BP 122/80 | HR 84 | Resp 16 | Ht 71.0 in | Wt 258.0 lb

## 2016-10-08 DIAGNOSIS — Z86010 Personal history of colonic polyps: Secondary | ICD-10-CM

## 2016-10-08 MED ORDER — POLYETHYLENE GLYCOL 3350 17 GM/SCOOP PO POWD: ORAL | 0 refills | Status: DC

## 2016-10-08 NOTE — Progress Notes (Signed)
Patient ID: Christopher Escobar, male   DOB: Jan 18, 1956, 61 y.o.   MRN: [id number]  Chief Complaint  Patient presents with  . Colonoscopy    HPI Christopher Escobar is a 61 y.o. male here today for a evaluation of a colonoscopy. Last colonoscopy was 08/20/2011. Moves his bowels daily. He does notice some constipation along with loose BM's on occasion, but no bleeding.  The patient reports that he is been decreasing his various diabetic medications. Blood sugars tend to run about 140.  HPI  Past Medical History:  Diagnosis Date  . Diabetes mellitus without complication ([place]) 9833  . Hypertension 2008  . Personal history of colonic polyps     Past Surgical History:  Procedure Laterality Date  . BACK SURGERY  1989  . COLONOSCOPY  2013  . SHOULDER SURGERY Bilateral 2011,2012    History reviewed. No pertinent family history.  Social History Social History  Substance Use Topics  . Smoking status: Former Smoker    Packs/day: 1.00    Years: 30.00  . Smokeless tobacco: Never Used  . Alcohol use Yes    No Known Allergies  Current Outpatient Prescriptions  Medication Sig Dispense Refill  . atorvastatin (LIPITOR) 40 MG tablet Take 40 mg by mouth daily at 6 PM.     . glimepiride (AMARYL) 4 MG tablet Take 4 mg by mouth daily with breakfast.     . insulin detemir (LEVEMIR) 100 UNIT/ML injection Inject 30 Units into the skin at bedtime.    Marland Kitchen lisinopril-hydrochlorothiazide (PRINZIDE,ZESTORETIC) 20-12.5 MG tablet Take 1 tablet by mouth daily.     . metFORMIN (GLUCOPHAGE) 500 MG tablet Take 500 mg by mouth daily with breakfast.     . metoprolol succinate (TOPROL-XL) 25 MG 24 hr tablet Take 25 mg by mouth daily.     Marland Kitchen oxyCODONE-acetaminophen (PERCOCET) 7.5-325 MG tablet Take 1 tablet by mouth every 4 (four) hours as needed for severe pain.    . polyethylene glycol powder (GLYCOLAX/MIRALAX) powder 255 grams one bottle for colonoscopy prep 255 g 0   No current facility-administered medications  for this visit.     Review of Systems Review of Systems  Constitutional: Negative.   Respiratory: Negative.   Cardiovascular: Negative.     Blood pressure 122/80, pulse 84, resp. rate 16, height 5\' 11"  (1.803 m), weight 258 lb (117 kg).  Physical Exam Physical Exam  Constitutional: He is oriented to person, place, and time. He appears well-developed and well-nourished.  HENT:  Mouth/Throat: Oropharynx is clear and moist.  Eyes: Conjunctivae are normal. No scleral icterus.  Neck: Neck supple.  Cardiovascular: Normal rate, regular rhythm and normal heart sounds.   Pulmonary/Chest: Effort normal and breath sounds normal.  Lymphadenopathy:    He has no cervical adenopathy.  Neurological: He is alert and oriented to person, place, and time.  Skin: Skin is warm and dry.  Psychiatric: His behavior is normal.    Data Reviewed 08/20/2011 colonoscopy reviewed.  Assessment    Scheduled follow up for previously removed tubular adenoma of the rectosigmoid.    Plan    Previous resection of colonic polyp from the rectosigmoid.    Colonoscopy with possible biopsy/polypectomy prn: Information regarding the procedure, including its potential risks and complications (including but not limited to perforation of the bowel, which may require emergency surgery to repair, and bleeding) was verbally given to the patient. Educational information regarding lower intestinal endoscopy was given to the patient. Written instructions for how to complete the bowel  prep using Miralax were provided. The importance of drinking ample fluids to avoid dehydration as a result of the prep emphasized.  HPI, Physical Exam, Assessment and Plan have been scribed under the direction and in the presence of Hervey Ard, MD.  Gaspar Cola, CMA  I have completed the exam and reviewed the above documentation for accuracy and completeness.  I agree with the above.  Haematologist has been used and any errors in  dictation or transcription are unintentional.  Hervey Ard, M.D., F.A.C.S.  Christopher Escobar 10/08/2016, 10:11 PM  Patient has been scheduled for a colonoscopy on 10-28-16 at [facility]. Miralax prescription has been sent in to the patient's pharmacy today. Colonoscopy instructions have been reviewed with the patient. Patient has been asked to adjust his diabetic medication as follows: Hold metformin day of prep and procedure, decrease Levemir to 20 units evening prior to prep and procedure, and decrease Basaglar by half present dose. This patient is aware to call the office if they have further questions.   Dominga Ferry, CMA

## 2016-10-08 NOTE — Patient Instructions (Signed)
Colonoscopy, Adult A colonoscopy is an exam to look at the entire large intestine. During the exam, a lubricated, bendable tube is inserted into the anus and then passed into the rectum, colon, and other parts of the large intestine. A colonoscopy is often done as a part of normal colorectal screening or in response to certain symptoms, such as anemia, persistent diarrhea, abdominal pain, and blood in the stool. The exam can help screen for and diagnose medical problems, including:  Tumors.  Polyps.  Inflammation.  Areas of bleeding.  Tell a health care provider about:  Any allergies you have.  All medicines you are taking, including vitamins, herbs, eye drops, creams, and over-the-counter medicines.  Any problems you or family members have had with anesthetic medicines.  Any blood disorders you have.  Any surgeries you have had.  Any medical conditions you have.  Any problems you have had passing stool. What are the risks? Generally, this is a safe procedure. However, problems may occur, including:  Bleeding.  A tear in the intestine.  A reaction to medicines given during the exam.  Infection (rare).  What happens before the procedure? Eating and drinking restrictions Follow instructions from your health care provider about eating and drinking, which may include:  A few days before the procedure - follow a low-fiber diet. Avoid nuts, seeds, dried fruit, raw fruits, and vegetables.  1-3 days before the procedure - follow a clear liquid diet. Drink only clear liquids, such as clear broth or bouillon, black coffee or tea, clear juice, clear soft drinks or sports drinks, gelatin dessert, and popsicles. Avoid any liquids that contain red or purple dye.  On the day of the procedure - do not eat or drink anything during the 2 hours before the procedure, or within the time period that your health care provider recommends.  Bowel prep If you were prescribed an oral bowel prep  to clean out your colon:  Take it as told by your health care provider. Starting the day before your procedure, you will need to drink a large amount of medicated liquid. The liquid will cause you to have multiple loose stools until your stool is almost clear or light green.  If your skin or anus gets irritated from diarrhea, you may use these to relieve the irritation: ? Medicated wipes, such as adult wet wipes with aloe and vitamin E. ? A skin soothing-product like petroleum jelly.  If you vomit while drinking the bowel prep, take a break for up to 60 minutes and then begin the bowel prep again. If vomiting continues and you cannot take the bowel prep without vomiting, call your health care provider.  General instructions  Ask your health care provider about changing or stopping your regular medicines. This is especially important if you are taking diabetes medicines or blood thinners.  Plan to have someone take you home from the hospital or clinic. What happens during the procedure?  An IV tube may be inserted into one of your veins.  You will be given medicine to help you relax (sedative).  To reduce your risk of infection: ? Your health care team will wash or sanitize their hands. ? Your anal area will be washed with soap.  You will be asked to lie on your side with your knees bent.  Your health care provider will lubricate a long, thin, flexible tube. The tube will have a camera and a light on the end.  The tube will be inserted into your   anus.  The tube will be gently eased through your rectum and colon.  Air will be delivered into your colon to keep it open. You may feel some pressure or cramping.  The camera will be used to take images during the procedure.  A small tissue sample may be removed from your body to be examined under a microscope (biopsy). If any potential problems are found, the tissue will be sent to a lab for testing.  If small polyps are found, your  health care provider may remove them and have them checked for cancer cells.  The tube that was inserted into your anus will be slowly removed. The procedure may vary among health care providers and hospitals. What happens after the procedure?  Your blood pressure, heart rate, breathing rate, and blood oxygen level will be monitored until the medicines you were given have worn off.  Do not drive for 24 hours after the exam.  You may have a small amount of blood in your stool.  You may pass gas and have mild abdominal cramping or bloating due to the air that was used to inflate your colon during the exam.  It is up to you to get the results of your procedure. Ask your health care provider, or the department performing the procedure, when your results will be ready. This information is not intended to replace advice given to you by your health care provider. Make sure you discuss any questions you have with your health care provider. Document Released: 03/14/2000 Document Revised: 01/16/2016 Document Reviewed: 05/29/2015 Elsevier Interactive Patient Education  2018 [employer]  

## 2016-10-28 ENCOUNTER — Ambulatory Visit
Admission: RE | Admit: 2016-10-28 | Discharge: 2016-10-28 | Disposition: A | Discharge status: Home / Self Care | Payer: BLUE CROSS/BLUE SHIELD | Source: Ambulatory Visit | Attending: General Surgery | Admitting: General Surgery

## 2016-10-28 ENCOUNTER — Ambulatory Visit: Payer: BLUE CROSS/BLUE SHIELD | Admitting: Anesthesiology

## 2016-10-28 ENCOUNTER — Encounter
Admission: RE | Disposition: A | Discharge status: Home / Self Care | Payer: Self-pay | Source: Ambulatory Visit | Attending: General Surgery

## 2016-10-28 ENCOUNTER — Encounter: Payer: Self-pay | Admitting: *Deleted

## 2016-10-28 DIAGNOSIS — D124 Benign neoplasm of descending colon: Secondary | ICD-10-CM | POA: Diagnosis not present

## 2016-10-28 DIAGNOSIS — Z87891 Personal history of nicotine dependence: Secondary | ICD-10-CM | POA: Insufficient documentation

## 2016-10-28 DIAGNOSIS — Z79899 Other long term (current) drug therapy: Secondary | ICD-10-CM | POA: Diagnosis not present

## 2016-10-28 DIAGNOSIS — Z86010 Personal history of colonic polyps: Secondary | ICD-10-CM | POA: Insufficient documentation

## 2016-10-28 DIAGNOSIS — Z794 Long term (current) use of insulin: Secondary | ICD-10-CM | POA: Insufficient documentation

## 2016-10-28 DIAGNOSIS — Z1211 Encounter for screening for malignant neoplasm of colon: Secondary | ICD-10-CM | POA: Insufficient documentation

## 2016-10-28 DIAGNOSIS — J4520 Mild intermittent asthma, uncomplicated: Secondary | ICD-10-CM | POA: Diagnosis not present

## 2016-10-28 DIAGNOSIS — E669 Obesity, unspecified: Secondary | ICD-10-CM | POA: Insufficient documentation

## 2016-10-28 DIAGNOSIS — D122 Benign neoplasm of ascending colon: Secondary | ICD-10-CM | POA: Diagnosis not present

## 2016-10-28 DIAGNOSIS — Z6836 Body mass index (BMI) 36.0-36.9, adult: Secondary | ICD-10-CM | POA: Diagnosis not present

## 2016-10-28 DIAGNOSIS — K6289 Other specified diseases of anus and rectum: Secondary | ICD-10-CM | POA: Diagnosis not present

## 2016-10-28 DIAGNOSIS — I10 Essential (primary) hypertension: Secondary | ICD-10-CM | POA: Insufficient documentation

## 2016-10-28 DIAGNOSIS — Q438 Other specified congenital malformations of intestine: Secondary | ICD-10-CM | POA: Diagnosis not present

## 2016-10-28 DIAGNOSIS — K5730 Diverticulosis of large intestine without perforation or abscess without bleeding: Secondary | ICD-10-CM | POA: Diagnosis not present

## 2016-10-28 DIAGNOSIS — K635 Polyp of colon: Secondary | ICD-10-CM | POA: Insufficient documentation

## 2016-10-28 DIAGNOSIS — E119 Type 2 diabetes mellitus without complications: Secondary | ICD-10-CM | POA: Diagnosis not present

## 2016-10-28 DIAGNOSIS — G4730 Sleep apnea, unspecified: Secondary | ICD-10-CM | POA: Insufficient documentation

## 2016-10-28 HISTORY — PX: COLONOSCOPY WITH PROPOFOL: SHX5780

## 2016-10-28 LAB — GLUCOSE, CAPILLARY: Glucose-Capillary: 142 mg/dL — ABNORMAL HIGH (ref 65–99)

## 2016-10-28 SURGERY — COLONOSCOPY WITH PROPOFOL
Anesthesia: General

## 2016-10-28 MED ORDER — MIDAZOLAM HCL 2 MG/2ML IJ SOLN
INTRAMUSCULAR | Status: DC | PRN
  Administered 2016-10-28: 2 mg via INTRAVENOUS

## 2016-10-28 MED ORDER — GLIMEPIRIDE 4 MG PO TABS: 4.0000 mg | ORAL_TABLET | Freq: Every day | ORAL | 1 refills | Status: DC

## 2016-10-28 MED ORDER — PROPOFOL 10 MG/ML IV BOLUS
INTRAVENOUS | Status: DC | PRN
  Administered 2016-10-28: 60 mg via INTRAVENOUS

## 2016-10-28 MED ORDER — FENTANYL CITRATE (PF) 100 MCG/2ML IJ SOLN: INTRAMUSCULAR | Status: DC | PRN

## 2016-10-28 MED ORDER — PROPOFOL 500 MG/50ML IV EMUL
INTRAVENOUS | Status: AC
Start: 2016-10-28 — End: 2016-10-28
  Filled 2016-10-28: qty 50

## 2016-10-28 MED ORDER — PROPOFOL 500 MG/50ML IV EMUL
INTRAVENOUS | Status: DC | PRN
  Administered 2016-10-28: 150 ug/kg/min via INTRAVENOUS

## 2016-10-28 MED ORDER — SODIUM CHLORIDE 0.9 % IV SOLN
INTRAVENOUS | Status: DC
  Administered 2016-10-28: 1000 mL via INTRAVENOUS

## 2016-10-28 MED ORDER — MIDAZOLAM HCL 2 MG/2ML IJ SOLN
INTRAMUSCULAR | Status: AC
  Filled 2016-10-28: qty 2

## 2016-10-28 MED ORDER — LIDOCAINE HCL (PF) 2 % IJ SOLN
INTRAMUSCULAR | Status: AC
  Filled 2016-10-28: qty 2

## 2016-10-28 MED ORDER — FENTANYL CITRATE (PF) 100 MCG/2ML IJ SOLN
INTRAMUSCULAR | Status: AC
  Filled 2016-10-28: qty 2

## 2016-10-28 MED ORDER — PHENYLEPHRINE HCL 10 MG/ML IJ SOLN
INTRAMUSCULAR | Status: DC | PRN
  Administered 2016-10-28 (×4): 100 ug via INTRAVENOUS

## 2016-10-28 MED ORDER — FENTANYL CITRATE (PF) 100 MCG/2ML IJ SOLN
INTRAMUSCULAR | Status: DC | PRN
  Administered 2016-10-28: 25 ug via INTRAVENOUS

## 2016-10-28 MED ORDER — LIDOCAINE HCL (CARDIAC) 20 MG/ML IV SOLN
INTRAVENOUS | Status: DC | PRN
  Administered 2016-10-28: 40 mg via INTRAVENOUS

## 2016-10-28 MED ORDER — METFORMIN HCL 500 MG PO TABS: 500.0000 mg | ORAL_TABLET | Freq: Every day | ORAL | 1 refills | Status: DC

## 2016-10-28 NOTE — Anesthesia Preprocedure Evaluation (Signed)
Anesthesia Evaluation  Patient identified by MRN, date of birth, ID band Patient awake    Reviewed: Allergy & Precautions, NPO status , Patient's Chart, lab work & pertinent test results  History of Anesthesia Complications Negative for: history of anesthetic complications  Airway Mallampati: III  TM Distance: >3 FB Neck ROM: Full    Dental  (+) Lower Dentures, Upper Dentures   Pulmonary asthma (mild intermittent) , sleep apnea (cannot tolerate CPAP) , neg COPD, former smoker,    breath sounds clear to auscultation- rhonchi (-) wheezing      Cardiovascular hypertension, Pt. on medications (-) CAD, (-) Past MI and (-) Cardiac Stents  Rhythm:Regular Rate:Normal - Systolic murmurs and - Diastolic murmurs    Neuro/Psych negative neurological ROS  negative psych ROS   GI/Hepatic negative GI ROS, Neg liver ROS,   Endo/Other  diabetes, Insulin Dependent  Renal/GU negative Renal ROS     Musculoskeletal negative musculoskeletal ROS (+)   Abdominal (+) + obese,   Peds  Hematology negative hematology ROS (+)   Anesthesia Other Findings Past Medical History: 2010: Diabetes mellitus without complication ([place]) 9509: Hypertension No date: Personal history of colonic polyps   Reproductive/Obstetrics                             Anesthesia Physical Anesthesia Plan  ASA: III  Anesthesia Plan: General   Post-op Pain Management:    Induction: Intravenous  PONV Risk Score and Plan: 1 and Propofol infusion  Airway Management Planned: Natural Airway  Additional Equipment:   Intra-op Plan:   Post-operative Plan:   Informed Consent: I have reviewed the patients History and Physical, chart, labs and discussed the procedure including the risks, benefits and alternatives for the proposed anesthesia with the patient or authorized representative who has indicated his/her understanding and acceptance.    Dental advisory given  Plan Discussed with: CRNA and Anesthesiologist  Anesthesia Plan Comments:         Anesthesia Quick Evaluation

## 2016-10-28 NOTE — Transfer of Care (Signed)
Immediate Anesthesia Transfer of Care Note  Patient: Christopher Escobar  Procedure(s) Performed: Procedure(s): COLONOSCOPY WITH PROPOFOL (N/A)  Patient Location: PACU  Anesthesia Type:General  Level of Consciousness: awake  Airway & Oxygen Therapy: Patient Spontanous Breathing and Patient connected to nasal cannula oxygen  Post-op Assessment: Report given to RN and Post -op Vital signs reviewed and stable  Post vital signs: Reviewed and stable  Last Vitals:  Vitals:   10/28/16 1349 10/28/16 1500  BP: 128/89 108/62  Pulse: 91 80  Resp: 20   Temp: (!) 35.9 C (!) 36.2 C    Last Pain:  Vitals:   10/28/16 1500  TempSrc: Tympanic         Complications: No apparent anesthesia complications

## 2016-10-28 NOTE — Anesthesia Post-op Follow-up Note (Cosign Needed)
Anesthesia QCDR form completed.        

## 2016-10-28 NOTE — Op Note (Signed)
[facility] Gastroenterology Patient Name: Christopher Escobar Procedure Date: 10/28/2016 2:17 PM MRN: [id number] Account #: [id number] Date of Birth: Apr 05, 1955 Admit Type: Outpatient Age: 61 Room: [facility] ENDO ROOM 1 Gender: Male Note Status: Finalized Procedure:            Colonoscopy Indications:          Screening for colorectal malignant neoplasm Providers:            Robert Bellow, MD Referring MD:         Leona Carry. Hall Busing, MD (Referring MD) Medicines:            Monitored Anesthesia Care Complications:        No immediate complications. Procedure:            Pre-Anesthesia Assessment:                       - Prior to the procedure, a History and Physical was                        performed, and patient medications, allergies and                        sensitivities were reviewed. The patient's tolerance of                        previous anesthesia was reviewed.                       - The risks and benefits of the procedure and the                        sedation options and risks were discussed with the                        patient. All questions were answered and informed                        consent was obtained.                       After obtaining informed consent, the colonoscope was                        passed under direct vision. Throughout the procedure,                        the patient's blood pressure, pulse, and oxygen                        saturations were monitored continuously. The                        Colonoscope was introduced through the anus and                        advanced to the the cecum, identified by appendiceal                        orifice and ileocecal valve. The colonoscopy was  somewhat difficult due to a tortuous colon. Successful                        completion of the procedure was aided by using manual                        pressure. The patient tolerated the procedure well. The                         quality of the bowel preparation was excellent. Findings:      Two sessile polyps were found in the descending colon, proximal       descending colon and mid ascending colon. The polyps were 6 mm in size.       These were biopsied with a cold forceps for histology.      A few medium-mouthed diverticula were found in the sigmoid colon.      The perianal exam findings include a 5 mm subcutaneous nodule on the       anterior surface of the anus. Possible cyst. . Impression:           - Two 6 mm polyps in the descending colon, in the                        proximal descending colon and in the mid ascending                        colon. Biopsied.                       - Diverticulosis in the sigmoid colon.                       - Abnormal perianal exam. Recommendation:       - Telephone endoscopist for pathology results in 1 week. Procedure Code(s):    --- Professional ---                       [phone], Colonoscopy, flexible; with biopsy, single or                        multiple Diagnosis Code(s):    --- Professional ---                       Z12.11, Encounter for screening for malignant neoplasm                        of colon                       D12.4, Benign neoplasm of descending colon                       D12.2, Benign neoplasm of ascending colon                       K57.30, Diverticulosis of large intestine without                        perforation or abscess without bleeding CPT copyright 2016 [employer]. All rights reserved. The codes documented in this report are preliminary and upon  coder review may  be revised to meet current compliance requirements. Robert Bellow, MD 10/28/2016 2:58:28 PM This report has been signed electronically. Number of Addenda: 0 Note Initiated On: 10/28/2016 2:17 PM Scope Withdrawal Time: 0 hours 18 minutes 33 seconds  Total Procedure Duration: 0 hours 28 minutes 18 seconds       [facility]

## 2016-10-28 NOTE — Anesthesia Procedure Notes (Signed)
Procedure Name: MAC Date/Time: 10/28/2016 2:25 PM Performed by: Allean Found Pre-anesthesia Checklist: Patient identified, Emergency Drugs available, Suction available, Patient being monitored and Timeout performed Patient Re-evaluated:Patient Re-evaluated prior to induction Oxygen Delivery Method: Nasal cannula Induction Type: IV induction Placement Confirmation: positive ETCO2 Dental Injury: Teeth and Oropharynx as per pre-operative assessment

## 2016-10-28 NOTE — H&P (Signed)
Christopher Escobar [id number] 20-Dec-1955     HPI: 61 y/o male s/p resection of a tubular adenoma 5 years ago during a colonoscopy. For follow up exam. Tolerated prep well.    Prescriptions Prior to Admission  Medication Sig Dispense Refill Last Dose  . atorvastatin (LIPITOR) 40 MG tablet Take 40 mg by mouth daily at 6 PM.    10/27/2016 at Unknown time  . glimepiride (AMARYL) 4 MG tablet Take 4 mg by mouth daily with breakfast.    10/27/2016 at Unknown time  . insulin detemir (LEVEMIR) 100 UNIT/ML injection Inject 30 Units into the skin at bedtime.   10/27/2016 at Unknown time  . lisinopril-hydrochlorothiazide (PRINZIDE,ZESTORETIC) 20-12.5 MG tablet Take 1 tablet by mouth daily.    10/27/2016 at Unknown time  . metFORMIN (GLUCOPHAGE) 500 MG tablet Take 500 mg by mouth daily with breakfast.    10/27/2016 at Unknown time  . metoprolol succinate (TOPROL-XL) 25 MG 24 hr tablet Take 25 mg by mouth daily.    10/27/2016 at Unknown time  . oxyCODONE-acetaminophen (PERCOCET) 7.5-325 MG tablet Take 1 tablet by mouth every 4 (four) hours as needed for severe pain.   Past Week at Unknown time  . polyethylene glycol powder (GLYCOLAX/MIRALAX) powder 255 grams one bottle for colonoscopy prep 255 g 0 10/27/2016 at Unknown time   No Known Allergies Past Medical History:  Diagnosis Date  . Diabetes mellitus without complication ([place]) 2992  . Hypertension 2008  . Personal history of colonic polyps    Past Surgical History:  Procedure Laterality Date  . BACK SURGERY  1989  . COLONOSCOPY  2013  . SHOULDER SURGERY Bilateral 2011,2012   Social History   Social History  . Marital status: Single    Spouse name: N/A  . Number of children: N/A  . Years of education: N/A   Occupational History  . Not on file.   Social History Main Topics  . Smoking status: Former Smoker    Packs/day: 1.00    Years: 30.00  . Smokeless tobacco: Never Used  . Alcohol use Yes     Comment: seldom  . Drug use: No  . Sexual  activity: Not on file   Other Topics Concern  . Not on file   Social History Narrative  . No narrative on file   Social History   Social History Narrative  . No narrative on file     ROS: Negative.     PE: HEENT: Negative. Lungs: Clear. Cardio: RR.  Assessment/Plan:  Proceed with planned endoscopy.  Robert Bellow 10/28/2016

## 2016-10-29 ENCOUNTER — Encounter: Payer: Self-pay | Admitting: General Surgery

## 2016-10-29 NOTE — Anesthesia Postprocedure Evaluation (Signed)
Anesthesia Post Note  Patient: Christopher Escobar  Procedure(s) Performed: Procedure(s) (LRB): COLONOSCOPY WITH PROPOFOL (N/A)  Patient location during evaluation: Endoscopy Anesthesia Type: General Level of consciousness: awake and alert Pain management: pain level controlled Vital Signs Assessment: post-procedure vital signs reviewed and stable Respiratory status: spontaneous breathing, nonlabored ventilation, respiratory function stable and patient connected to nasal cannula oxygen Cardiovascular status: blood pressure returned to baseline and stable Postop Assessment: no signs of nausea or vomiting Anesthetic complications: no     Last Vitals:  Vitals:   10/28/16 1520 10/28/16 1530  BP: 133/86 123/88  Pulse: 79 76  Resp: 17 18  Temp:      Last Pain:  Vitals:   10/28/16 1500  TempSrc: Tympanic                 Precious Haws Piscitello

## 2016-10-30 LAB — SURGICAL PATHOLOGY

## 2016-10-31 ENCOUNTER — Telehealth: Payer: Self-pay

## 2016-10-31 NOTE — Telephone Encounter (Signed)
-----   Message from Robert Bellow, MD sent at 10/30/2016  7:41 PM EDT ----- Please notify the patient that the polyp was benign. I would recommend a repeat exam in 5 years. Thank you ----- Message ----- From: Interface, Lab In [facility] One Sent: 10/30/2016   6:14 PM To: Robert Bellow, MD

## 2016-10-31 NOTE — Telephone Encounter (Signed)
Notified patient as instructed, patient pleased. Discussed follow-up appointments, patient agrees. Patient placed in recalls.   

## 2016-11-03 DIAGNOSIS — Z9889 Other specified postprocedural states: Secondary | ICD-10-CM | POA: Insufficient documentation

## 2016-11-03 DIAGNOSIS — E1169 Type 2 diabetes mellitus with other specified complication: Secondary | ICD-10-CM | POA: Insufficient documentation

## 2016-12-03 DIAGNOSIS — M75112 Incomplete rotator cuff tear or rupture of left shoulder, not specified as traumatic: Secondary | ICD-10-CM | POA: Insufficient documentation

## 2016-12-03 DIAGNOSIS — M7582 Other shoulder lesions, left shoulder: Secondary | ICD-10-CM | POA: Insufficient documentation

## 2016-12-03 DIAGNOSIS — M19012 Primary osteoarthritis, left shoulder: Secondary | ICD-10-CM | POA: Insufficient documentation

## 2017-01-29 ENCOUNTER — Encounter: Payer: Self-pay | Admitting: General Surgery

## 2017-02-02 ENCOUNTER — Encounter
Admission: RE | Admit: 2017-02-02 | Discharge: 2017-02-02 | Disposition: A | Discharge status: Home / Self Care | Payer: 59 | Source: Ambulatory Visit | Attending: Surgery | Admitting: Surgery

## 2017-02-02 HISTORY — DX: Sleep apnea, unspecified: G47.30

## 2017-02-02 NOTE — Patient Instructions (Addendum)
  Your procedure is scheduled on: 02-10-17 TUESDAY Report to Same Day Surgery 2nd floor medical mall [facility] Entrance-take elevator on left to 2nd floor.  Check in with surgery information desk.) To find out your arrival time please call [phone] between 1PM - 3PM on 02-09-17 MONDAY  Remember: Instructions that are not followed completely may result in serious medical risk, up to and including death, or upon the discretion of your surgeon and anesthesiologist your surgery may need to be rescheduled.    _x___ 1. Do not eat food after midnight the night before your procedure. NO GUM CHEWING OR CANDY AFTER MIDNIGHT.  You may drink WATER up to 2 hours before you are scheduled to arrive at the hospital for your procedure.  Do not drink clear liquids within 2 hours of your scheduled arrival to the hospital.  Type 1 and type 2 diabetics should only drink water.       __x__ 2. No Alcohol for 24 hours before or after surgery.   __x__3. No Smoking for 24 prior to surgery.   ____  4. Bring all medications with you on the day of surgery if instructed.    __x__ 5. Notify your doctor if there is any change in your medical condition     (cold, fever, infections).     Do not wear jewelry, make-up, hairpins, clips or nail polish.  Do not wear lotions, powders, or perfumes. You may wear deodorant.  Do not shave 48 hours prior to surgery. Men may shave face and neck.  Do not bring valuables to the hospital.    [facility] is not responsible for any belongings or valuables.               Contacts, dentures or bridgework may not be worn into surgery.  Leave your suitcase in the car. After surgery it may be brought to your room.  For patients admitted to the hospital, discharge time is determined by your treatment team.   Patients discharged the day of surgery will not be allowed to drive home.  You will need someone to drive you home and stay with you the night of your procedure.    Please  read over the following fact sheets that you were given:   [facility] Preparing for Surgery and or MRSA Information   _x___ TAKE THE FOLLOWING MEDICATIONS THE MORNING OF SURGERY WITH A SMALL SIP OF WATER.  These include:  1. GABAPENTIN   2. METOPROLOL  3.  4.  5.  6.  ____Fleets enema or Magnesium Citrate as directed.   _x___ Use CHG Soap or sage wipes as directed on instruction sheet   ____ Use inhalers on the day of surgery and bring to hospital day of surgery  _X___ Stop Metformin  2 days prior to surgery-LAST DOSE OF METFORMIN ON Saturday, November 10TH.    _X___ Take 1/2 of usual insulin dose the night before surgery and none on the morning surgery-TAKE HALF OF YOUR LEVEMIR (20 UNITS) THE NIGHT BEFORE SURGERY AND NO INSULIN THE MORNING OF SURGERY  ____ Follow recommendations from Cardiologist, Pulmonologist or PCP regarding stopping Aspirin, Coumadin, Plavix ,Eliquis, Effient, or Pradaxa, and Pletal.  X____Stop Anti-inflammatories such as Advil, Aleve, Ibuprofen, Motrin, Naproxen, Naprosyn, Goodies powders or aspirin products NOW-OK to take Tylenol OR PERCOCET IF NEEDED   ____ Stop supplements until after surgery.     ____ Bring C-Pap to the hospital.

## 2017-02-03 ENCOUNTER — Encounter
Admission: RE | Admit: 2017-02-03 | Discharge: 2017-02-03 | Disposition: A | Discharge status: Home / Self Care | Payer: BLUE CROSS/BLUE SHIELD | Source: Ambulatory Visit | Attending: Surgery | Admitting: Surgery

## 2017-02-03 DIAGNOSIS — Z01812 Encounter for preprocedural laboratory examination: Secondary | ICD-10-CM | POA: Diagnosis present

## 2017-02-03 DIAGNOSIS — I444 Left anterior fascicular block: Secondary | ICD-10-CM | POA: Insufficient documentation

## 2017-02-03 DIAGNOSIS — I10 Essential (primary) hypertension: Secondary | ICD-10-CM | POA: Diagnosis not present

## 2017-02-03 DIAGNOSIS — Z01810 Encounter for preprocedural cardiovascular examination: Secondary | ICD-10-CM | POA: Insufficient documentation

## 2017-02-03 LAB — BASIC METABOLIC PANEL
Anion gap: 11 (ref 5–15)
BUN: 20 mg/dL (ref 6–20)
CALCIUM: 9.7 mg/dL (ref 8.9–10.3)
CHLORIDE: 97 mmol/L — AB (ref 101–111)
CO2: 25 mmol/L (ref 22–32)
CREATININE: 1.01 mg/dL (ref 0.61–1.24)
Glucose, Bld: 372 mg/dL — ABNORMAL HIGH (ref 65–99)
Potassium: 3.6 mmol/L (ref 3.5–5.1)
SODIUM: 133 mmol/L — AB (ref 135–145)

## 2017-02-03 NOTE — Pre-Procedure Instructions (Signed)
Pt with glucose of 372.  Pt needing medical clearance.  Called Tiffany at Dr Poggi's office and left her a message regarding clearance.  Faxed clearance to Tiffany and to Dr Hall Busing with fax confirmation confirmed from both offices

## 2017-02-03 NOTE — Pre-Procedure Instructions (Signed)
ECG 12 Lead12/01/2015 [place] Component Name Value Ref Range  EKG Ventricular Rate 95 BPM   EKG Atrial Rate 95 BPM   EKG P-R Interval 190 ms  EKG QRS Duration 106 ms  EKG Q-T Interval 376 ms  EKG QTC Calculation 472 ms  EKG Calculated P Axis 58 degrees   EKG Calculated R Axis -58 degrees   EKG Calculated T Axis 82 degrees   Result Narrative  SINUS RHYTHM WITH OCCASIONAL PREMATURE VENTRICULAR BEATS AND POSSIBLE PREMATURE ATRIAL BEATS WITH ABERRANT CONDUCTION LOW VOLTAGE QRS LEFT ANTERIOR FASCICULAR BLOCK POSSIBLE INFERIOR INFARCT (CITED ON OR BEFORE 11-May-2004) POSSIBLE ANTEROLATERAL INFARCT, AGE UNDETERMINED ABNORMAL ECG WHEN COMPARED WITH ECG OF 12-Nov-2007 12:07, ABERRANT CONDUCTION IS NOW PRESENT LEFT ANTERIOR FASCICULAR BLOCK IS NOW PRESENT Confirmed by SMITHMD, SIDNEY ([id number]) on 03/12/2015 7:58:05 AM  Status Results Details   Encounter

## 2017-02-04 NOTE — Pre-Procedure Instructions (Signed)
Spoke with Ivin Booty at Dr Juanell Fairly office to make sure they received clearance that I faxed over late yesterday afternoon.  She states she did receive it and that she will call pt to get him in to be seen since he has surgery on 02-10-17

## 2017-02-09 NOTE — Pre-Procedure Instructions (Signed)
CALLED DR TATES OFFICE AND SPOKE WITH KAREN HIS NURSE AND SHE STATES THAT PT CAME IN TO THEIR OFFICE THIS AM TO RECHECK HIS GLUCOSE AND IT WAS > 400.  SHE STATES THAT DR TATE DID NOT SIGN THE CLEARANCE NOTE SO IT LOOKS LIKE HE IS NOT CLEARED.  I TOLD HER THAT I NEED TO KNOW FOR SURE IF HE IS CANCELLED SO I CAN LET HIS SURGEONS OFFICE KNOW IF HE IS CANCELLED OR NOT.  KAREN STATES SHE WILL PUT NOTE ON DR TATES DESK REGARDING CLEARANCE

## 2017-02-09 NOTE — Pre-Procedure Instructions (Signed)
SURGERY CANCELLED PER DR TATES OFFICE-KAREN STATED THAT DR TATE HAD SPOKEN WITH DR POGGI THIS AM REGARDING PT BEING CANCELLED UNTIL GLUCOSE IS CONTROLLED

## 2017-02-10 ENCOUNTER — Encounter: Admission: RE | Payer: Self-pay | Source: Ambulatory Visit

## 2017-02-10 ENCOUNTER — Ambulatory Visit: Admission: RE | Admit: 2017-02-10 | Payer: BLUE CROSS/BLUE SHIELD | Source: Ambulatory Visit | Admitting: Surgery

## 2017-02-10 SURGERY — ARTHROSCOPY, SHOULDER WITH REPAIR, ROTATOR CUFF, OPEN
Anesthesia: Choice | Laterality: Left

## 2017-03-25 ENCOUNTER — Other Ambulatory Visit: Payer: Self-pay

## 2017-03-25 ENCOUNTER — Ambulatory Visit
Admission: RE | Admit: 2017-03-25 | Discharge: 2017-03-25 | Disposition: A | Discharge status: Home / Self Care | Payer: BLUE CROSS/BLUE SHIELD | Source: Ambulatory Visit | Attending: Surgery | Admitting: Surgery

## 2017-03-25 DIAGNOSIS — Z01812 Encounter for preprocedural laboratory examination: Secondary | ICD-10-CM | POA: Diagnosis present

## 2017-03-25 LAB — BASIC METABOLIC PANEL
ANION GAP: 7 (ref 5–15)
BUN: 18 mg/dL (ref 6–20)
CHLORIDE: 103 mmol/L (ref 101–111)
CO2: 26 mmol/L (ref 22–32)
Calcium: 9.2 mg/dL (ref 8.9–10.3)
Creatinine, Ser: 0.83 mg/dL (ref 0.61–1.24)
GFR calc non Af Amer: >60 mL/min (ref >60)
Glucose, Bld: 92 mg/dL (ref 65–99)
POTASSIUM: 3.7 mmol/L (ref 3.5–5.1)
Sodium: 136 mmol/L (ref 135–145)

## 2017-03-25 NOTE — Patient Instructions (Signed)
Your procedure is scheduled WI:OXBDZHG 3, 2019 THURSDAY Report to Same Day Surgery on the 2nd floor in the [facility]. To find out your arrival time, please call [phone] between 1PM - 3PM on: April 01, 2017 [facility]  REMEMBER: Instructions that are not followed completely may result in serious medical risk, up to and including death; or upon the discretion of your surgeon and anesthesiologist your surgery may need to be rescheduled.  Do not eat food after midnight the night before your procedure.  No gum chewing or hard candies.  You may however, drink CLEAR liquids up to 2 hours before you are scheduled to arrive at the hospital for your procedure.  Do not drink clear liquids within 2 hours of the start of your surgery.  Clear liquids include: - water   Do NOT drink anything that is not on this list.  Type 1 and Type 2 diabetics should only drink water.  No Alcohol for 24 hours before or after surgery.  No Smoking including e-cigarettes for 24 hours prior to surgery. No chewable tobacco products for at least 6 hours prior to surgery. No nicotine patches on the day of surgery.  Notify your doctor if there is any change in your medical condition (cold, fever, infection).  Do not wear jewelry, make-up, hairpins, clips or nail polish.  Do not wear lotions, powders, or perfumes. You may NOT wear deodorant.  Do not shave 48 hours prior to surgery. Men may shave face and neck.  Contacts and dentures may not be worn into surgery.  Do not bring valuables to the hospital. [facility] is not responsible for any belongings or valuables.   TAKE THESE MEDICATIONS THE MORNING OF SURGERY WITH A SIP OF WATER: GABAPENTIN METOPROLOL MAY TAKE PAIN PILL    Use CHG Soap or wipes as directed on instruction sheet.   Stop Metformin  2 days PRIOR TO SURGERY LAST DOSE ON Mar 30, 2017.  Take 1/2 of usual insulin dose the night before surgery and none on the morning of  surgery.   Stop Anti-inflammatories such as Advil, Aleve, Ibuprofen, Motrin, Naproxen, Naprosyn, Goodie powder, or aspirin products. (May take Tylenol or Acetaminophen if needed.)  Stop ANY OVER THE COUNTER supplements until after surgery. (May continue Vitamin D, Vitamin B, and multivitamin.)  If you are being discharged the day of surgery, you will not be allowed to drive home. You will need someone to drive you home and stay with you that night.   If you are taking public transportation, you will need to have a responsible adult to with you.  Please call the number above if you have any questions about these instructions.

## 2017-04-02 ENCOUNTER — Encounter
Admission: RE | Disposition: A | Discharge status: Home / Self Care | Payer: Self-pay | Source: Ambulatory Visit | Attending: Surgery

## 2017-04-02 ENCOUNTER — Encounter: Payer: Self-pay | Admitting: *Deleted

## 2017-04-02 ENCOUNTER — Ambulatory Visit
Admission: RE | Admit: 2017-04-02 | Discharge: 2017-04-02 | Disposition: A | Discharge status: Home / Self Care | Payer: Worker's Compensation | Source: Ambulatory Visit | Attending: Surgery | Admitting: Surgery

## 2017-04-02 ENCOUNTER — Ambulatory Visit: Payer: Worker's Compensation | Admitting: Anesthesiology

## 2017-04-02 DIAGNOSIS — I10 Essential (primary) hypertension: Secondary | ICD-10-CM | POA: Insufficient documentation

## 2017-04-02 DIAGNOSIS — Z87891 Personal history of nicotine dependence: Secondary | ICD-10-CM | POA: Diagnosis not present

## 2017-04-02 DIAGNOSIS — S43432A Superior glenoid labrum lesion of left shoulder, initial encounter: Secondary | ICD-10-CM | POA: Diagnosis not present

## 2017-04-02 DIAGNOSIS — M67912 Unspecified disorder of synovium and tendon, left shoulder: Secondary | ICD-10-CM | POA: Insufficient documentation

## 2017-04-02 DIAGNOSIS — Y9389 Activity, other specified: Secondary | ICD-10-CM | POA: Diagnosis not present

## 2017-04-02 DIAGNOSIS — Y990 Civilian activity done for income or pay: Secondary | ICD-10-CM | POA: Diagnosis not present

## 2017-04-02 DIAGNOSIS — M75112 Incomplete rotator cuff tear or rupture of left shoulder, not specified as traumatic: Secondary | ICD-10-CM | POA: Insufficient documentation

## 2017-04-02 DIAGNOSIS — G4730 Sleep apnea, unspecified: Secondary | ICD-10-CM | POA: Insufficient documentation

## 2017-04-02 DIAGNOSIS — M7542 Impingement syndrome of left shoulder: Secondary | ICD-10-CM | POA: Diagnosis present

## 2017-04-02 DIAGNOSIS — Z794 Long term (current) use of insulin: Secondary | ICD-10-CM | POA: Diagnosis not present

## 2017-04-02 DIAGNOSIS — Z79899 Other long term (current) drug therapy: Secondary | ICD-10-CM | POA: Diagnosis not present

## 2017-04-02 DIAGNOSIS — M19012 Primary osteoarthritis, left shoulder: Secondary | ICD-10-CM | POA: Diagnosis not present

## 2017-04-02 DIAGNOSIS — E119 Type 2 diabetes mellitus without complications: Secondary | ICD-10-CM | POA: Diagnosis not present

## 2017-04-02 DIAGNOSIS — Z86010 Personal history of colonic polyps: Secondary | ICD-10-CM | POA: Insufficient documentation

## 2017-04-02 DIAGNOSIS — X500XXA Overexertion from strenuous movement or load, initial encounter: Secondary | ICD-10-CM | POA: Diagnosis not present

## 2017-04-02 DIAGNOSIS — Y929 Unspecified place or not applicable: Secondary | ICD-10-CM | POA: Diagnosis not present

## 2017-04-02 HISTORY — PX: SHOULDER ARTHROSCOPY WITH SUBACROMIAL DECOMPRESSION: SHX5684

## 2017-04-02 LAB — GLUCOSE, CAPILLARY
Glucose-Capillary: 126 mg/dL — ABNORMAL HIGH (ref 65–99)
Glucose-Capillary: 121 mg/dL — ABNORMAL HIGH (ref 65–99)

## 2017-04-02 SURGERY — SHOULDER ARTHROSCOPY WITH SUBACROMIAL DECOMPRESSION
Anesthesia: General | Laterality: Left | Wound class: Clean

## 2017-04-02 MED ORDER — MIDAZOLAM HCL 2 MG/2ML IJ SOLN
INTRAMUSCULAR | Status: AC
  Filled 2017-04-02: qty 2

## 2017-04-02 MED ORDER — BUPIVACAINE-EPINEPHRINE 0.5% -1:200000 IJ SOLN
INTRAMUSCULAR | Status: DC | PRN
  Administered 2017-04-02: 30 mL

## 2017-04-02 MED ORDER — LIDOCAINE HCL (PF) 2 % IJ SOLN
INTRAMUSCULAR | Status: AC
  Filled 2017-04-02: qty 10

## 2017-04-02 MED ORDER — OXYCODONE HCL 5 MG PO TABS: 5.0000 mg | ORAL_TABLET | ORAL | 0 refills | Status: DC | PRN

## 2017-04-02 MED ORDER — ROCURONIUM BROMIDE 50 MG/5ML IV SOLN
INTRAVENOUS | Status: AC
  Filled 2017-04-02: qty 1

## 2017-04-02 MED ORDER — METOCLOPRAMIDE HCL 10 MG PO TABS: 5.0000 mg | ORAL_TABLET | Freq: Three times a day (TID) | ORAL | Status: DC | PRN

## 2017-04-02 MED ORDER — ONDANSETRON HCL 4 MG/2ML IJ SOLN: 4.0000 mg | Freq: Once | INTRAMUSCULAR | Status: DC | PRN

## 2017-04-02 MED ORDER — DEXTROSE 5 % IV SOLN
2.0000 g | Freq: Once | INTRAVENOUS | Status: AC
  Administered 2017-04-02: 2 g via INTRAVENOUS
  Filled 2017-04-02: qty 2000

## 2017-04-02 MED ORDER — OXYCODONE HCL 5 MG PO TABS: 5.0000 mg | ORAL_TABLET | ORAL | Status: DC | PRN

## 2017-04-02 MED ORDER — EPINEPHRINE PF 1 MG/ML IJ SOLN
INTRAMUSCULAR | Status: DC | PRN
  Administered 2017-04-02: 2 mL

## 2017-04-02 MED ORDER — MIDAZOLAM HCL 2 MG/2ML IJ SOLN
INTRAMUSCULAR | Status: AC
  Administered 2017-04-02: 2 mg via INTRAVENOUS
  Filled 2017-04-02: qty 2

## 2017-04-02 MED ORDER — FENTANYL CITRATE (PF) 100 MCG/2ML IJ SOLN
50.0000 ug | Freq: Once | INTRAMUSCULAR | Status: AC
  Administered 2017-04-02: 50 ug via INTRAVENOUS

## 2017-04-02 MED ORDER — MIDAZOLAM HCL 2 MG/2ML IJ SOLN
2.0000 mg | Freq: Once | INTRAMUSCULAR | Status: AC
  Administered 2017-04-02: 2 mg via INTRAVENOUS

## 2017-04-02 MED ORDER — FENTANYL CITRATE (PF) 100 MCG/2ML IJ SOLN
INTRAMUSCULAR | Status: AC
  Administered 2017-04-02: 50 ug via INTRAVENOUS
  Filled 2017-04-02: qty 2

## 2017-04-02 MED ORDER — SODIUM CHLORIDE 0.9 % IV SOLN
INTRAVENOUS | Status: DC
  Administered 2017-04-02 (×2): via INTRAVENOUS

## 2017-04-02 MED ORDER — DEXAMETHASONE SODIUM PHOSPHATE 4 MG/ML IJ SOLN
INTRAMUSCULAR | Status: DC | PRN
  Administered 2017-04-02: 5 mg via INTRAVENOUS

## 2017-04-02 MED ORDER — BUPIVACAINE HCL (PF) 0.5 % IJ SOLN
INTRAMUSCULAR | Status: DC | PRN
  Administered 2017-04-02: 10 mL via PERINEURAL

## 2017-04-02 MED ORDER — BUPIVACAINE-EPINEPHRINE (PF) 0.5% -1:200000 IJ SOLN
INTRAMUSCULAR | Status: AC
  Filled 2017-04-02: qty 30

## 2017-04-02 MED ORDER — CEFAZOLIN SODIUM-DEXTROSE 2-4 GM/100ML-% IV SOLN
INTRAVENOUS | Status: AC
  Filled 2017-04-02: qty 100

## 2017-04-02 MED ORDER — GLIMEPIRIDE 4 MG PO TABS: 8.0000 mg | ORAL_TABLET | Freq: Two times a day (BID) | ORAL | 3 refills | Status: DC

## 2017-04-02 MED ORDER — FAMOTIDINE 20 MG PO TABS
20.0000 mg | ORAL_TABLET | Freq: Once | ORAL | Status: AC
  Administered 2017-04-02: 20 mg via ORAL

## 2017-04-02 MED ORDER — METOPROLOL SUCCINATE ER 25 MG PO TB24
25.0000 mg | ORAL_TABLET | Freq: Once | ORAL | Status: AC
  Administered 2017-04-02: 25 mg via ORAL
  Filled 2017-04-02: qty 1

## 2017-04-02 MED ORDER — METFORMIN HCL 500 MG PO TABS: 1000.0000 mg | ORAL_TABLET | Freq: Two times a day (BID) | ORAL | 3 refills | Status: DC

## 2017-04-02 MED ORDER — METOCLOPRAMIDE HCL 5 MG/ML IJ SOLN: 5.0000 mg | Freq: Three times a day (TID) | INTRAMUSCULAR | Status: DC | PRN

## 2017-04-02 MED ORDER — CEFAZOLIN SODIUM-DEXTROSE 2-4 GM/100ML-% IV SOLN: 2.0000 g | Freq: Once | INTRAVENOUS | Status: DC

## 2017-04-02 MED ORDER — ROCURONIUM BROMIDE 100 MG/10ML IV SOLN
INTRAVENOUS | Status: DC | PRN
  Administered 2017-04-02: 30 mg via INTRAVENOUS
  Administered 2017-04-02: 50 mg via INTRAVENOUS
  Administered 2017-04-02: 10 mg via INTRAVENOUS

## 2017-04-02 MED ORDER — DEXMEDETOMIDINE HCL IN NACL 80 MCG/20ML IV SOLN
INTRAVENOUS | Status: AC
  Filled 2017-04-02: qty 20

## 2017-04-02 MED ORDER — BUPIVACAINE HCL (PF) 0.5 % IJ SOLN
INTRAMUSCULAR | Status: AC
  Filled 2017-04-02: qty 10

## 2017-04-02 MED ORDER — BUPIVACAINE LIPOSOME 1.3 % IJ SUSP
INTRAMUSCULAR | Status: DC | PRN
  Administered 2017-04-02: 20 mL via PERINEURAL

## 2017-04-02 MED ORDER — ONDANSETRON HCL 4 MG/2ML IJ SOLN: 4.0000 mg | Freq: Four times a day (QID) | INTRAMUSCULAR | Status: DC | PRN

## 2017-04-02 MED ORDER — DEXAMETHASONE SODIUM PHOSPHATE 10 MG/ML IJ SOLN
INTRAMUSCULAR | Status: AC
  Filled 2017-04-02: qty 1

## 2017-04-02 MED ORDER — PROPOFOL 10 MG/ML IV BOLUS
INTRAVENOUS | Status: AC
  Filled 2017-04-02: qty 20

## 2017-04-02 MED ORDER — EPINEPHRINE PF 1 MG/ML IJ SOLN
INTRAMUSCULAR | Status: AC
  Filled 2017-04-02: qty 2

## 2017-04-02 MED ORDER — FENTANYL CITRATE (PF) 250 MCG/5ML IJ SOLN
INTRAMUSCULAR | Status: AC
Start: 2017-04-02 — End: ?
  Filled 2017-04-02: qty 5

## 2017-04-02 MED ORDER — FAMOTIDINE 20 MG PO TABS
ORAL_TABLET | ORAL | Status: AC
  Administered 2017-04-02: 20 mg via ORAL
  Filled 2017-04-02: qty 1

## 2017-04-02 MED ORDER — ONDANSETRON HCL 4 MG/2ML IJ SOLN
INTRAMUSCULAR | Status: AC
  Filled 2017-04-02: qty 2

## 2017-04-02 MED ORDER — LIDOCAINE HCL (PF) 1 % IJ SOLN
INTRAMUSCULAR | Status: AC
  Filled 2017-04-02: qty 5

## 2017-04-02 MED ORDER — LIDOCAINE HCL (CARDIAC) 20 MG/ML IV SOLN
INTRAVENOUS | Status: DC | PRN
  Administered 2017-04-02: 100 mg via INTRAVENOUS

## 2017-04-02 MED ORDER — SUGAMMADEX SODIUM 500 MG/5ML IV SOLN
INTRAVENOUS | Status: AC
  Filled 2017-04-02: qty 5

## 2017-04-02 MED ORDER — POTASSIUM CHLORIDE IN NACL 20-0.9 MEQ/L-% IV SOLN: INTRAVENOUS | Status: DC

## 2017-04-02 MED ORDER — PROPOFOL 10 MG/ML IV BOLUS
INTRAVENOUS | Status: DC | PRN
  Administered 2017-04-02: 170 mg via INTRAVENOUS

## 2017-04-02 MED ORDER — PHENYLEPHRINE HCL 10 MG/ML IJ SOLN
INTRAMUSCULAR | Status: DC | PRN
  Administered 2017-04-02: 50 ug via INTRAVENOUS
  Administered 2017-04-02: 100 ug via INTRAVENOUS

## 2017-04-02 MED ORDER — GLYCOPYRROLATE 0.2 MG/ML IJ SOLN
INTRAMUSCULAR | Status: AC
  Filled 2017-04-02: qty 1

## 2017-04-02 MED ORDER — ONDANSETRON HCL 4 MG/2ML IJ SOLN
INTRAMUSCULAR | Status: DC | PRN
  Administered 2017-04-02: 4 mg via INTRAVENOUS

## 2017-04-02 MED ORDER — DEXMEDETOMIDINE HCL 200 MCG/2ML IV SOLN
INTRAVENOUS | Status: DC | PRN
  Administered 2017-04-02: 12 ug via INTRAVENOUS

## 2017-04-02 MED ORDER — FENTANYL CITRATE (PF) 100 MCG/2ML IJ SOLN
INTRAMUSCULAR | Status: DC | PRN
  Administered 2017-04-02: 50 ug via INTRAVENOUS
  Administered 2017-04-02: 75 ug via INTRAVENOUS
  Administered 2017-04-02: 50 ug via INTRAVENOUS
  Administered 2017-04-02: 75 ug via INTRAVENOUS

## 2017-04-02 MED ORDER — FENTANYL CITRATE (PF) 100 MCG/2ML IJ SOLN: 25.0000 ug | INTRAMUSCULAR | Status: DC | PRN

## 2017-04-02 MED ORDER — SUGAMMADEX SODIUM 200 MG/2ML IV SOLN
INTRAVENOUS | Status: DC | PRN
  Administered 2017-04-02: 300 mg via INTRAVENOUS

## 2017-04-02 MED ORDER — ONDANSETRON HCL 4 MG PO TABS: 4.0000 mg | ORAL_TABLET | Freq: Four times a day (QID) | ORAL | Status: DC | PRN

## 2017-04-02 MED ORDER — LIDOCAINE HCL (PF) 1 % IJ SOLN
INTRAMUSCULAR | Status: DC | PRN
  Administered 2017-04-02: 5 mL via SUBCUTANEOUS

## 2017-04-02 MED ORDER — BUPIVACAINE LIPOSOME 1.3 % IJ SUSP
INTRAMUSCULAR | Status: AC
  Filled 2017-04-02: qty 20

## 2017-04-02 MED ORDER — PHENYLEPHRINE HCL 10 MG/ML IJ SOLN
INTRAVENOUS | Status: DC | PRN
  Administered 2017-04-02: 30 ug/min via INTRAVENOUS

## 2017-04-02 SURGICAL SUPPLY — 45 items
ANCHOR SUT W/ ORTHOCORD (Anchor) ×12 IMPLANT
BIT DRILL JUGRKNT W/NDL BIT2.9 (DRILL) IMPLANT
BLADE FULL RADIUS 3.5 (BLADE) ×3 IMPLANT
BUR ACROMIONIZER 4.0 (BURR) ×3 IMPLANT
CANNULA SHAVER 8MMX76MM (CANNULA) ×6 IMPLANT
CHLORAPREP W/TINT 26ML (MISCELLANEOUS) ×3 IMPLANT
COVER MAYO STAND STRL (DRAPES) ×3 IMPLANT
DRAPE IMP U-DRAPE 54X76 (DRAPES) ×6 IMPLANT
DRILL JUGGERKNOT W/NDL BIT 2.9 (DRILL)
DRSG OPSITE POSTOP 4X8 (GAUZE/BANDAGES/DRESSINGS) ×3 IMPLANT
ELECT REM PT RETURN 9FT ADLT (ELECTROSURGICAL) ×3
ELECTRODE REM PT RTRN 9FT ADLT (ELECTROSURGICAL) ×1 IMPLANT
GAUZE PETRO XEROFOAM 1X8 (MISCELLANEOUS) ×3 IMPLANT
GAUZE SPONGE 4X4 12PLY STRL (GAUZE/BANDAGES/DRESSINGS) ×3 IMPLANT
GLOVE BIO SURGEON STRL SZ7.5 (GLOVE) ×6 IMPLANT
GLOVE BIO SURGEON STRL SZ8 (GLOVE) ×6 IMPLANT
GLOVE BIOGEL PI IND STRL 8 (GLOVE) ×1 IMPLANT
GLOVE BIOGEL PI INDICATOR 8 (GLOVE) ×2
GLOVE INDICATOR 8.0 STRL GRN (GLOVE) ×3 IMPLANT
GOWN STRL REUS W/ TWL LRG LVL3 (GOWN DISPOSABLE) ×1 IMPLANT
GOWN STRL REUS W/ TWL XL LVL3 (GOWN DISPOSABLE) ×1 IMPLANT
GOWN STRL REUS W/TWL LRG LVL3 (GOWN DISPOSABLE) ×2
GOWN STRL REUS W/TWL XL LVL3 (GOWN DISPOSABLE) ×2
GRASPER SUT 15 45D LOW PRO (SUTURE) ×3 IMPLANT
IV LACTATED RINGER IRRG 3000ML (IV SOLUTION) ×6
IV LR IRRIG 3000ML ARTHROMATIC (IV SOLUTION) ×3 IMPLANT
MANIFOLD NEPTUNE II (INSTRUMENTS) ×3 IMPLANT
MASK FACE SPIDER DISP (MASK) ×3 IMPLANT
MAT BLUE FLOOR 46X72 FLO (MISCELLANEOUS) ×3 IMPLANT
NDL MAYO CATGUT SZ5 (NEEDLE)
NDL SUT 5 .5 CRC TPR PNT MAYO (NEEDLE) IMPLANT
NEEDLE REVERSE CUT 1/2 CRC (NEEDLE) IMPLANT
PACK ARTHROSCOPY SHOULDER (MISCELLANEOUS) ×3 IMPLANT
SLING ARM LRG DEEP (SOFTGOODS) IMPLANT
SLING ULTRA II LG (MISCELLANEOUS) ×3 IMPLANT
STAPLER SKIN PROX 35W (STAPLE) ×3 IMPLANT
STRAP SAFETY BODY (MISCELLANEOUS) ×3 IMPLANT
SUT ETHIBOND 0 MO6 C/R (SUTURE) ×3 IMPLANT
SUT VIC AB 2-0 CT1 27 (SUTURE) ×4
SUT VIC AB 2-0 CT1 TAPERPNT 27 (SUTURE) ×2 IMPLANT
TAPE MICROFOAM 4IN (TAPE) ×3 IMPLANT
TUBING ARTHRO INFLOW-ONLY STRL (TUBING) ×3 IMPLANT
TUBING CONNECTING 10 (TUBING) ×2 IMPLANT
TUBING CONNECTING 10' (TUBING) ×1
WAND HAND CNTRL MULTIVAC 90 (MISCELLANEOUS) ×3 IMPLANT

## 2017-04-02 NOTE — Addendum Note (Signed)
Addendum  created 04/02/17 1056 by Bernardo Heater, CRNA   Child order released for a procedure order, Intraprocedure Blocks edited, [place] created via procedure documentation, Sign clinical note

## 2017-04-02 NOTE — Discharge Instructions (Addendum)
AMBULATORY SURGERY  DISCHARGE INSTRUCTIONS   1) The drugs that you were given will stay in your system until tomorrow so for the next 24 hours you should not:  A) Drive an automobile B) Make any legal decisions C) Drink any alcoholic beverage   2) You may resume regular meals tomorrow.  Today it is better to start with liquids and gradually work up to solid foods.  You may eat anything you prefer, but it is better to start with liquids, then soup and crackers, and gradually work up to solid foods.   3) Please notify your doctor immediately if you have any unusual bleeding, trouble breathing, redness and pain at the surgery site, drainage, fever, or pain not relieved by medication   Keep dressing dry and intact.  May shower after dressing changed on post-op day #4 (Monday).  Cover staples with Band-Aids after drying off. Apply ice frequently to shoulder. Take ibuprofen 800 mg TID with meals for 7-10 days, then as necessary. Take oxycodone as prescribed when needed.  May supplement with ES Tylenol if necessary. Keep shoulder immobilizer on at all times except may remove for bathing purposes. Follow-up in 10-14 days or as scheduled.

## 2017-04-02 NOTE — Anesthesia Post-op Follow-up Note (Signed)
Anesthesia QCDR form completed.        

## 2017-04-02 NOTE — Anesthesia Postprocedure Evaluation (Signed)
Anesthesia Post Note  Patient: Christopher Escobar  Procedure(s) Performed: SHOULDER ARTHROSCOPY WITH DECOMPRESSION, DEBRIDEMENT, SLAP REPAIR, (Left )  Patient location during evaluation: PACU Anesthesia Type: General Level of consciousness: awake and alert Pain management: pain level controlled Vital Signs Assessment: post-procedure vital signs reviewed and stable Respiratory status: spontaneous breathing and respiratory function stable Cardiovascular status: stable Anesthetic complications: no     Last Vitals:  Vitals:   04/02/17 0802 04/02/17 1003  BP:  122/71  Pulse: 85 84  Resp: 19 18  Temp:  (!) 36 C  SpO2: 100% 98%    Last Pain:  Vitals:   04/02/17 1003  TempSrc:   PainSc: 0-No pain                 KEPHART,WILLIAM K

## 2017-04-02 NOTE — Anesthesia Preprocedure Evaluation (Signed)
Anesthesia Evaluation  Patient identified by MRN, date of birth, ID band Patient awake    Reviewed: Allergy & Precautions, NPO status , Patient's Chart, lab work & pertinent test results  History of Anesthesia Complications Negative for: history of anesthetic complications  Airway Mallampati: III       Dental  (+) Upper Dentures, Lower Dentures   Pulmonary sleep apnea (not using CPAP) , neg COPD, former smoker,           Cardiovascular hypertension, Pt. on medications and Pt. on home beta blockers (-) Past MI and (-) CHF (-) dysrhythmias (-) Valvular Problems/Murmurs     Neuro/Psych neg Seizures    GI/Hepatic Neg liver ROS, neg GERD  ,  Endo/Other  diabetes, Type 2, Oral Hypoglycemic Agents, Insulin Dependent  Renal/GU negative Renal ROS     Musculoskeletal   Abdominal   Peds  Hematology   Anesthesia Other Findings   Reproductive/Obstetrics                             Anesthesia Physical Anesthesia Plan  ASA: III  Anesthesia Plan: General   Post-op Pain Management: GA combined w/ Regional for post-op pain   Induction:   PONV Risk Score and Plan: 2 and Dexamethasone and Ondansetron  Airway Management Planned: Oral ETT  Additional Equipment:   Intra-op Plan:   Post-operative Plan:   Informed Consent: I have reviewed the patients History and Physical, chart, labs and discussed the procedure including the risks, benefits and alternatives for the proposed anesthesia with the patient or authorized representative who has indicated his/her understanding and acceptance.     Plan Discussed with:   Anesthesia Plan Comments:         Anesthesia Quick Evaluation

## 2017-04-02 NOTE — Anesthesia Procedure Notes (Signed)
Procedure Name: Intubation Date/Time: 04/02/2017 8:12 AM Performed by: Bernardo Heater, CRNA Pre-anesthesia Checklist: Patient identified, Emergency Drugs available, Suction available and Patient being monitored Patient Re-evaluated:Patient Re-evaluated prior to induction Oxygen Delivery Method: Circle system utilized Preoxygenation: Pre-oxygenation with 100% oxygen Induction Type: IV induction Laryngoscope Size: Mac and 4 Grade View: Grade I Tube size: 7.0 mm Number of attempts: 1 Placement Confirmation: ETT inserted through vocal cords under direct vision,  positive ETCO2 and breath sounds checked- equal and bilateral Secured at: 23 cm Tube secured with: Tape Dental Injury: Teeth and Oropharynx as per pre-operative assessment

## 2017-04-02 NOTE — Addendum Note (Signed)
Addendum  created 04/02/17 1353 by Bernardo Heater, CRNA   Intraprocedure Flowsheets edited

## 2017-04-02 NOTE — Op Note (Signed)
04/02/2017  9:57 AM  Patient:   Christopher Escobar  Pre-Op Diagnosis:   Impingement/tendinopathy with partial-thickness rotator cuff tear and SLAP tear, left shoulder.  Post-Op Diagnosis: Impingement/tendinopathy with nonsurgical partial-thickness rotator cuff tear, large SLAP tear, and degenerative joint disease, left shoulder.  Procedure: Limited arthroscopic debridement, arthroscopic SLAP repair, and subacromial decompression, left shoulder.  Anesthesia: General endotracheal with interscalene block placed preoperatively by the anesthesiologist.  Surgeon:   Pascal Lux, MD  Assistant:   Cameron Proud, PA-C  Findings: As above.  The SLAP tear extended from the 9:00 to the 1:30 position.  It was adhered to the underlying rotator cuff superiorly.  The biceps tendon had chronically ruptured from its labral anchor and retracted distally.  There was mild tearing of the superficial-most fibers of the articular surface of the supraspinatus tendon involving less than 10% of the footprint.  There also was an incomplete longitudinal split tear of the subscapularis tendon which did not compromise the tendon's attachment to the lesser tuberosity.  Grade 2 chondromalacial changes were noted involving both the humerus and glenoid.  Complications: None  Fluids:   1000 cc  Estimated blood loss: 5 cc  Tourniquet time: None  Drains: None  Closure: Staples   Brief clinical note: The patient is a 62 year old male with a history of left shoulder pain. The patient's symptoms have progressed despite medications, activity modification, etc. The patient's history and examination are consistent with impingement/tendinopathy with a possible rotator cuff tear. A preoperative MRI scan demonstrated the presence of some partial thickness tearing of the supraspinatus and subscapularis tendons, as well as a SLAP tear. The patient presents at this time for definitive management of these  shoulder symptoms.  Procedure: The patient underwent placement of an interscalene block by the anesthesiologist in the preoperative holding area before being brought into the operating room and lain in the supine position. The patient then underwent general endotracheal intubation and anesthesia before being repositioned in the beach chair position using the beach chair positioner. The left shoulder and upper extremity were prepped with ChloraPrep solution before being draped sterilely. Preoperative antibiotics were administered. A timeout was performed to confirm the proper surgical site before the expected portal sites and incision site were injected with 0.5% Sensorcaine with epinephrine. A posterior portal was created and the glenohumeral joint thoroughly inspected with the findings as described above. An anterior portal was created using an outside-in technique. The labrum and rotator cuff were further probed, again confirming the above-noted findings.  Areas of labral fraying were debrided back to stable margins using the full-radius resector, as were areas of synovitis. The superior portion of the labrum was adherent to the underlying rotator cuff, so these adhesions were released using the ArthroCare wand. The labral tear was repaired using three Mitek BioKnotless anchors placed anteriorly and superiorly.  Subsequent probing of the repair demonstrated excellent stability. The repair also was stable to external rotation of the humerus.  The small longitudinal partial-thickness subscapularis tendon tear was felt to be stable, so it was not addressed surgically. The instruments were removed from the joint after suctioning the excess fluid.  The camera was repositioned through the posterior portal into the subacromial space. A separate lateral portal was created using an outside-in technique. The 3.5 mm full-radius resector was introduced and used to perform a subtotal bursectomy. The ArthroCare wand was  then inserted and used to remove the periosteal tissue off the undersurface of the anterior third of the acromion as well as  to recess the coracoacromial ligament from its attachment along the anterior and lateral margins of the acromion. The 4.0 mm acromionizing bur was introduced and used to complete the decompression by removing the undersurface of the anterior third of the acromion. The full radius resector was reintroduced to remove any residual bony debris before the ArthroCare wand was reintroduced to obtain hemostasis. The rotator cuff was carefully inspected from both the posterior and lateral portals.  There was a small area of superficial scuffing of the anterior fibers of the supraspinatus but no significant partial thickness or bursal surface tear or full-thickness tear was identified. The instruments were then removed from the subacromial space after suctioning the excess fluid.  The wound was copiously irrigated with sterile saline solution before the deltoid raphae was reapproximated using 2-0 Vicryl interrupted sutures. The subcutaneous tissues were closed in two layers using 2-0 Vicryl interrupted sutures before the skin was closed using staples. The portal sites also were closed using staples. A sterile bulky dressing was applied to the shoulder before the arm was placed into a shoulder immobilizer. The patient was then awakened, extubated, and returned to the recovery room in satisfactory condition after tolerating the procedure well.

## 2017-04-02 NOTE — Transfer of Care (Signed)
Immediate Anesthesia Transfer of Care Note  Patient: Christopher Escobar  Procedure(s) Performed: SHOULDER ARTHROSCOPY WITH DECOMPRESSION, DEBRIDEMENT, SLAP REPAIR, (Left )  Patient Location: PACU  Anesthesia Type:General  Level of Consciousness: awake, alert , oriented and patient cooperative  Airway & Oxygen Therapy: Patient Spontanous Breathing and Patient connected to nasal cannula oxygen  Post-op Assessment: Report given to RN and Post -op Vital signs reviewed and stable  Post vital signs: Reviewed and stable  Last Vitals:  Vitals:   04/02/17 0757 04/02/17 0802  BP: (!) 135/91   Pulse: 85 85  Resp: 14 19  Temp:    SpO2: 100% 100%    Last Pain:  Vitals:   04/02/17 0802  TempSrc:   PainSc: 0-No pain         Complications: No apparent anesthesia complications

## 2017-04-02 NOTE — H&P (Addendum)
Subjective:  Chief complaint:  Left shoulder pain.  The patient is a 62 y.o. male who presents today for a left shoulder arthroscopy with Dr. Roland Rack.  Pt has been ongoing for more than 4 months at this time and developed with an injury at work.  He pulled a heavy object and felt a sharp pain in his shoulder.  He has tried OTC anti-inflammatories and narcotic with limited benefit and presents today for definitive treatment of his left shoulder pain.  He presents today for a left shoulder scope with debridement, decompression, possible SLAP repair, possible rotator cuff repair and possible biceps tenodesis.  Patient Active Problem List   Diagnosis Date Noted  . History of colonic polyps 10/08/2016   Past Medical History:  Diagnosis Date  . Diabetes mellitus without complication ([place]) 3810  . Hypertension 2008  . Personal history of colonic polyps   . Sleep apnea    not using cpap    Past Surgical History:  Procedure Laterality Date  . BACK SURGERY  1989  . COLONOSCOPY  2013  . COLONOSCOPY WITH PROPOFOL N/A 10/28/2016   Procedure: COLONOSCOPY WITH PROPOFOL;  Surgeon: Robert Bellow, MD;  Location: [facility] ENDOSCOPY;  Service: Endoscopy;  Laterality: N/A;  . SHOULDER SURGERY Bilateral 2011,2012    Medications Prior to Admission  Medication Sig Dispense Refill Last Dose  . atorvastatin (LIPITOR) 40 MG tablet Take 40 mg by mouth daily at 6 PM.    04/01/2017  . gabapentin (NEURONTIN) 600 MG tablet Take 600-1,200 mg by mouth See admin instructions. Take 600 mg by mouth in the morning and take 1200 mg by mouth at bedtime   04/01/2017 at Unknown time  . glimepiride (AMARYL) 4 MG tablet Take 1 tablet (4 mg total) by mouth daily with breakfast. (Patient taking differently: Take 8 mg by mouth 2 (two) times daily. ) 1 tablet 1 04/01/2017 at Unknown time  . insulin detemir (LEVEMIR) 100 UNIT/ML injection Inject 40 Units into the skin at bedtime.    04/01/2017 at Unknown time  . Insulin Glargine (BASAGLAR  KWIKPEN) 100 UNIT/ML SOPN Inject 18-20 Units into the skin See admin instructions. 18 units in the morning and 20 units in the evening   04/01/2017 at Unknown time  . lisinopril-hydrochlorothiazide (PRINZIDE,ZESTORETIC) 20-12.5 MG tablet Take 1 tablet every morning by mouth.    04/01/2017 at 1800  . metFORMIN (GLUCOPHAGE) 500 MG tablet Take 1 tablet (500 mg total) by mouth daily with breakfast. (Patient taking differently: Take 1,000 mg by mouth 2 (two) times daily with a meal. ) 1 tablet 1 03/30/2017  . metoprolol succinate (TOPROL-XL) 25 MG 24 hr tablet Take 25 mg every morning by mouth.    04/01/2017 at 0830  . Multiple Vitamin (ONE-A-DAY MENS PO) Take 1 tablet by mouth daily.   04/01/2017 at Unknown time  . oxyCODONE-acetaminophen (PERCOCET) 7.5-325 MG tablet Take 2 tablets by mouth 3 (three) times daily as needed for severe pain.    04/01/2017 at Unknown time   No Known Allergies  Social History   Tobacco Use  . Smoking status: Former Smoker    Packs/day: 1.00    Years: 30.00    Pack years: 30.00    Types: Cigarettes    Last attempt to quit: 02/03/2007    Years since quitting: 10.1  . Smokeless tobacco: Never Used  Substance Use Topics  . Alcohol use: Yes    Comment: seldom    History reviewed. No pertinent family history.   Review of  Systems: As noted above. The patient denies any chest pain, shortness of breath, nausea, vomiting, diarrhea, constipation, belly pain, blood in his/her stool, or burning with urination.  Objective: Temp:  [97.3 F (36.3 C)] 97.3 F (36.3 C) (01/03 0615) Pulse Rate:  [81] 81 (01/03 0615) Resp:  [18] 18 (01/03 0615) BP: (144)/(90) 144/90 (01/03 0615) SpO2:  [97 %] 97 % (01/03 0615)  Physical Exam: General:  Alert, no acute distress Psychiatric:  Patient is competent for consent with normal mood and affect Cardiovascular:  RRR  Respiratory:  Clear to auscultation. No wheezing. Non-labored breathing GI:  Abdomen is soft and non-tender Skin:  No lesions  in the area of chief complaint Neurologic:  Sensation intact distally Lymphatic:  No axillary or cervical lymphadenopathy  Left shoulder exam: SKIN: normal other than well-healed arthroscopic portal sites SWELLING: none WARMTH: none LYMPH NODES: no adenopathy palpable CREPITUS: none TENDERNESS: Mild-moderate tenderness over the anterolateral shoulder ROM (active):      Forward flexion: 90    Abduction: 80    Internal rotation: Left iliac crest ROM (passive):      Forward flexion: 135    Abduction: 130    ER/IR at 90 abd: 85/55  He has mild-moderate pain at the extremes of all motions.  STRENGTH:   Forward flexion: 4/5                         Abduction: 4/5                         External rotation: 4-4+/5                         Internal rotation: 4-4+/5                         Pain with RC testing: Mild pain with resisted forward flexion and abduction  STABILITY: Normal  SPECIAL TESTS:       Luan Pulling' test: positive, mild                                     Speed's test: positive                                     Capsulitis - pain w/ passive ER: no                                     Crossed arm test: Mildly positive                                     Crank: Not evaluated                                     Anterior apprehension: Negative                                     Posterior apprehension: Not evaluated  Cervical  Spine:  Supple, non-tender.  ROM:    Flexion: 40    Extension: 30    Left/Right turn: 65/65    Left/Right tilt: 25/25  He has mild pain with left tilt.  Neck motion does not reproduce the patient's shoulder symptoms.  He has a negative Spurling's test bilaterally.  He is neurovascularly intact to the left upper extremity.  Imaging:  Shoulder Imaging, MRI: Left Shoulder: MRI Shoulder Cartilage: Partial thickness humeral head cartilage loss. Partial thickness glenoid cartilage loss. Subchondral cysts. MRI Shoulder Rotator  Cuff: Partial thickness tear of the subscapularis tendon more so than the supraspinatus and infraspinatus tendons. No retraction. MRI Shoulder Labrum / Biceps: Biceps tendinopathy. MRI Shoulder Bone: Subchondral cysts of the humerus and glenoid.  Assessment: Incomplete tear of left rotator cuff Rotator cuff tendonitis Superior glenoid labrum lesion of left shoulder.  Plan: The treatment options were discussed at length with the patient.  In addition, patient educational materials were provided regarding the diagnosis and treatment options.  The patient is quite frustrated by his persistent symptoms and functional limitations and would like to consider more aggressive treatment options.  Therefore, I have recommended a surgical procedure, specifically a left shoulder arthroscopy with debridement, decompression, possible labral and/or rotator cuff repairs, and possible biceps tenodesis.  The procedure was discussed with the patient, as were the potential risks (including bleeding, infection, nerve and/or blood vessel injury, persistent or recurrent pain, failure of the repair, progression of arthritis, need for further surgery, blood clots, strokes, heart attacks and/or arhythmias, pneumonia, etc.) and benefits.  The patient states his understanding and wishes to proceed.  All of the patient's questions and concerns were answered.  He can call any time with further concerns.  He will follow up post-surgery, routine.  He may continue to perform light duties at work, so long as he does not lift, push, pull, or carry more than 5 pounds with the left arm, and performs no activities at or above shoulder level with the left arm.   This note will serve as the history and physical for surgery scheduled on April 02, 2017 for a left shoulder arthroscopy with debridement, decompression, possible labral and/or rotator cuff repair, and possible biceps tenodesis by Dr. Roland Rack.    Raquel James, PA-C [place]

## 2017-04-02 NOTE — Anesthesia Procedure Notes (Signed)
Anesthesia Regional Block: Interscalene brachial plexus block   Pre-Anesthetic Checklist: ,, timeout performed, Correct Patient, Correct Site, Correct Laterality, Correct Procedure, Correct Position, site marked, Risks and benefits discussed,  Surgical consent,  Pre-op evaluation,  At surgeon's request and post-op pain management  Laterality: Left and Upper  Prep: chloraprep       Needles:  Injection technique: Single-shot  Needle Type: Stimiplex     Needle Length: 5cm  Needle Gauge: 22     Additional Needles:   Procedures:,,,, ultrasound used (permanent image in chart),,,,  Narrative:  Start time: 04/02/2017 7:53 AM End time: 04/02/2017 8:00 AM Injection made incrementally with aspirations every 5 mL.  Performed by: Personally  Anesthesiologist: [name], MD  Additional Notes: Functioning IV was confirmed and monitors were applied.  A 21mm 22ga Stimuplex needle was used. Sterile prep and drape,hand hygiene and sterile gloves were used.  Negative aspiration and negative test dose prior to incremental administration of local anesthetic. The patient tolerated the procedure well.

## 2017-06-03 DIAGNOSIS — I10 Essential (primary) hypertension: Secondary | ICD-10-CM | POA: Insufficient documentation

## 2017-10-21 ENCOUNTER — Other Ambulatory Visit: Payer: Self-pay | Admitting: Urology

## 2017-10-21 DIAGNOSIS — C61 Malignant neoplasm of prostate: Secondary | ICD-10-CM

## 2017-10-30 ENCOUNTER — Encounter
Admission: RE | Admit: 2017-10-30 | Discharge: 2017-10-30 | Disposition: A | Discharge status: Home / Self Care | Payer: BLUE CROSS/BLUE SHIELD | Source: Ambulatory Visit | Attending: Urology | Admitting: Urology

## 2017-10-30 ENCOUNTER — Ambulatory Visit
Admission: RE | Admit: 2017-10-30 | Discharge: 2017-10-30 | Disposition: A | Discharge status: Home / Self Care | Payer: BLUE CROSS/BLUE SHIELD | Source: Ambulatory Visit | Attending: Urology | Admitting: Urology

## 2017-10-30 DIAGNOSIS — K5730 Diverticulosis of large intestine without perforation or abscess without bleeding: Secondary | ICD-10-CM | POA: Insufficient documentation

## 2017-10-30 DIAGNOSIS — C61 Malignant neoplasm of prostate: Secondary | ICD-10-CM | POA: Insufficient documentation

## 2017-10-30 DIAGNOSIS — D3502 Benign neoplasm of left adrenal gland: Secondary | ICD-10-CM | POA: Diagnosis not present

## 2017-10-30 HISTORY — DX: Malignant (primary) neoplasm, unspecified: C80.1

## 2017-10-30 LAB — POCT I-STAT CREATININE: CREATININE: 0.8 mg/dL (ref 0.61–1.24)

## 2017-10-30 IMAGING — NM NM BONE WHOLE BODY
2 series · 4 of 4 positions shown · non-contrast
Comparison: None.

CLINICAL DATA: Current history of prostate cancer.

EXAM:
NUCLEAR MEDICINE WHOLE BODY BONE SCAN
TECHNIQUE: Whole body anterior and posterior images were obtained approximately
3 hours after intravenous injection of radiopharmaceutical.
RADIOPHARMACEUTICALS:  23.81 mCi [9S] MDP IV

[Series 1000: statics · 2.40mm/px · 2 of 2 frames shown]
[frame 1/2]
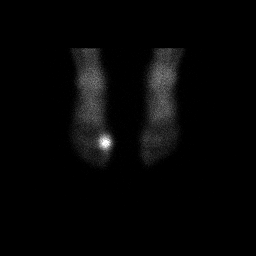
[frame 2/2]
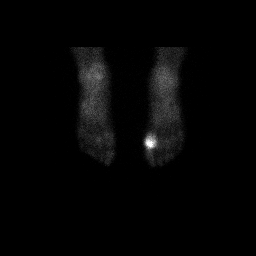

[Series 1000: 3 hr wholebody · 2.40mm/px · 2 of 2 frames shown]
[frame 1/2]
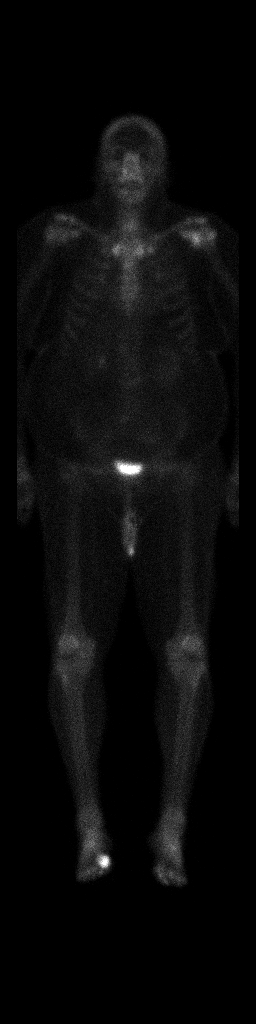
[frame 2/2]
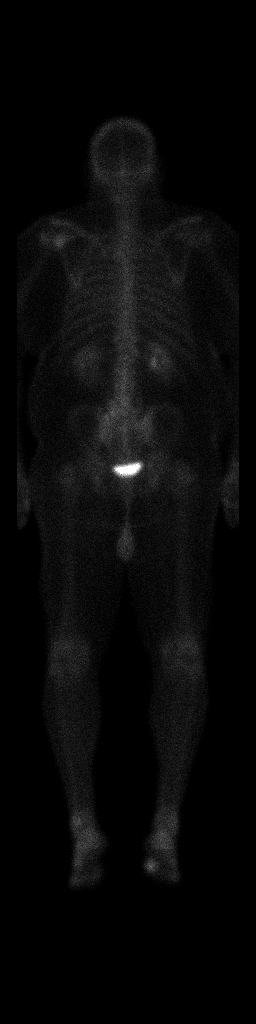

[4 of 4 positions shown; findings below may reference images not displayed]

FINDINGS: Abnormal uptake is seen involving the right first
metatarsophalangeal joint which may represent degenerative change or
recent trauma. Abnormal uptake is seen involving both
sternoclavicular joints consistent with degenerative change.
Asymmetric uptake is seen involving the left shoulder region which
may represent degenerative change, but metastatic disease cannot be
excluded. No other areas of abnormal uptake are noted.
IMPRESSION: Focus of abnormal uptake is seen involving the right first
metatarsophalangeal joint which may represent degenerative change or
traumatic injury. Plain film radiographs of this area is
recommended.

Asymmetric uptake is seen involving the left shoulder region which
may represent degenerative change, and plain film radiographs are
recommended for confirmation and to rule out metastatic disease.

No definite evidence of osseous metastases is noted.

## 2017-10-30 IMAGING — CT CT ABD-PEL WO/W CM
2 of 9 series · 12 of 46 positions shown, 18 images · IV contrast (omnipaque)
Comparison: [DATE]

CLINICAL DATA: Newly diagnosed prostate carcinoma.  Staging.

EXAM:
CT ABDOMEN AND PELVIS WITHOUT AND WITH CONTRAST
TECHNIQUE: Multidetector CT imaging of the abdomen and pelvis was performed
following the standard protocol before and following the bolus
administration of intravenous contrast.
CONTRAST:  100mL OMNIPAQUE IOHEXOL 350 MG/ML SOLN

[Series 4: abd/pel post · axial · 0.83mm/px · z∈[-1104,-654]mm · 9 of 111 slices shown, 15 images]
[im 11/111  soft-tissue]
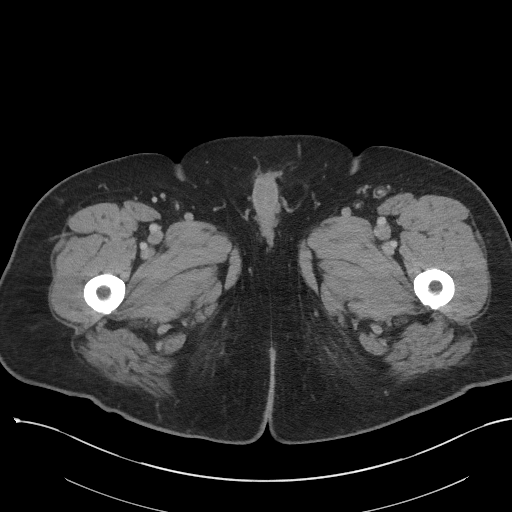
[im 11/111  bone]
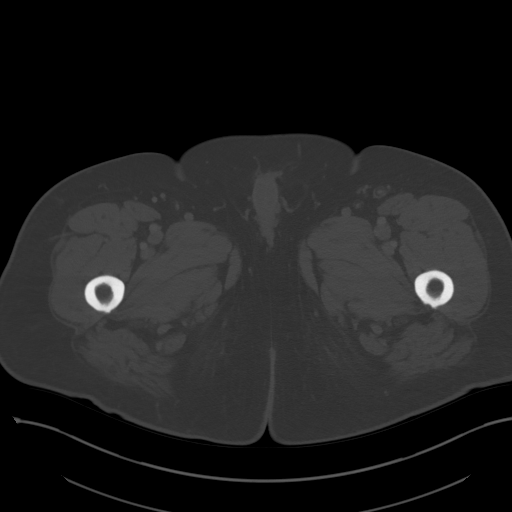
[im 21/111  soft-tissue]
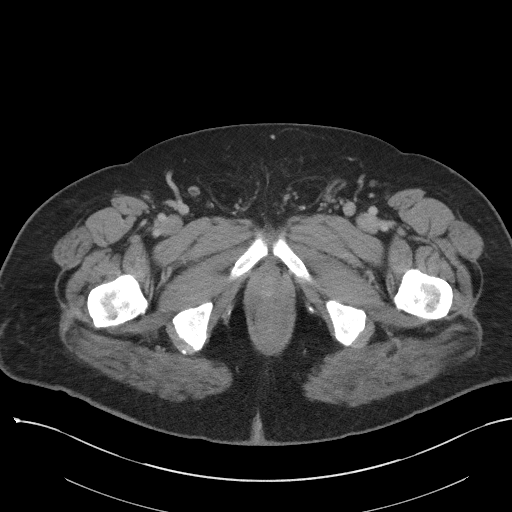
[im 31/111  soft-tissue]
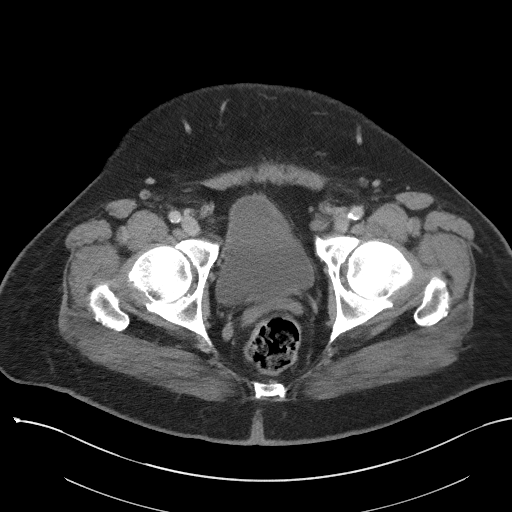
[im 41/111  soft-tissue]
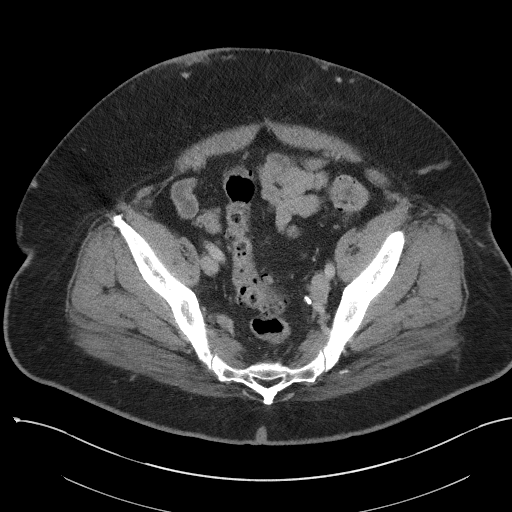
[im 61/111  soft-tissue]
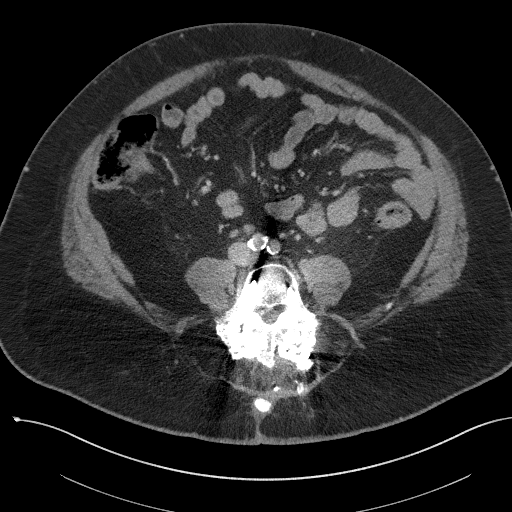
[im 71/111  soft-tissue]
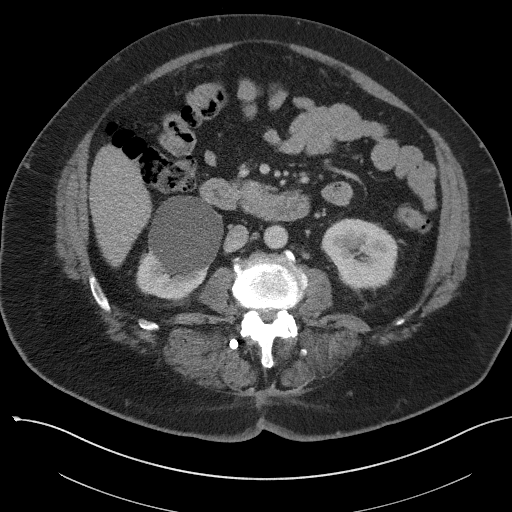
[im 71/111  lung]
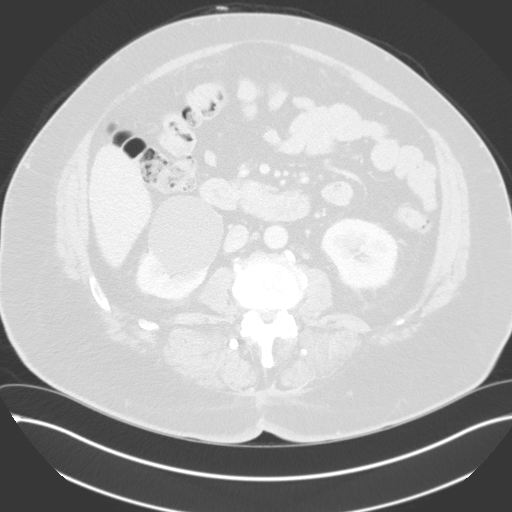
[im 81/111  soft-tissue]
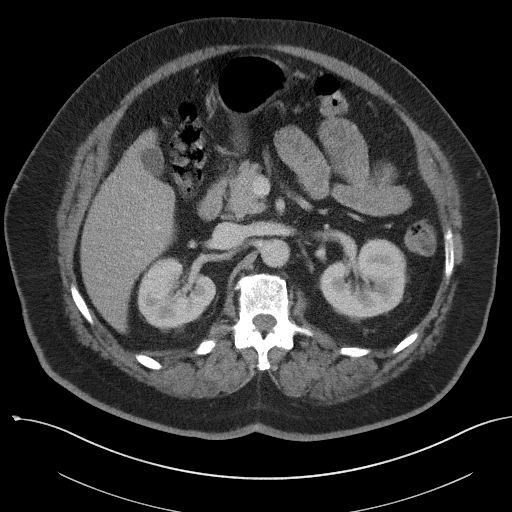
[im 81/111  lung]
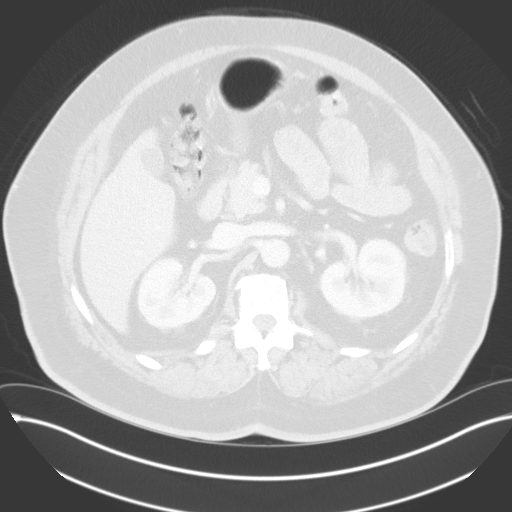
[im 91/111  soft-tissue]
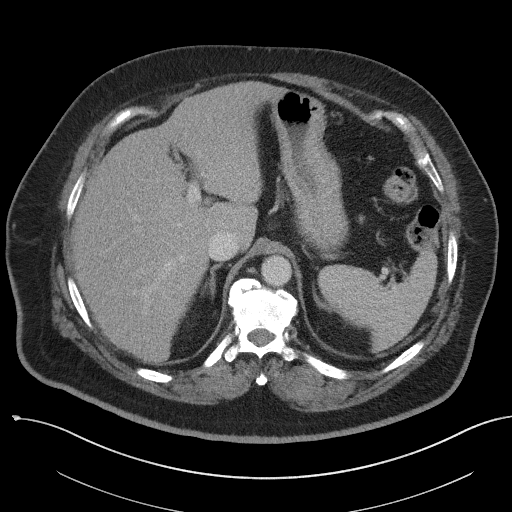
[im 91/111  lung]
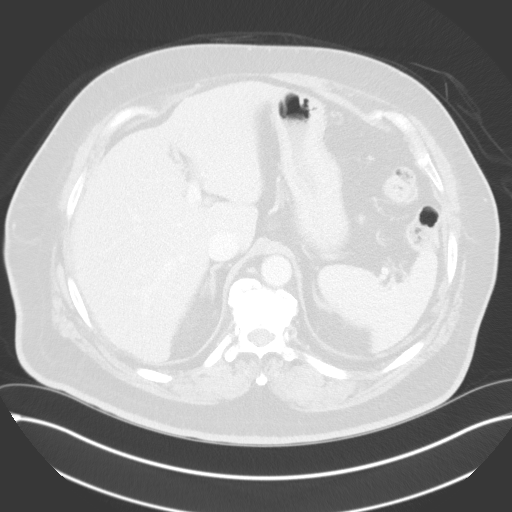
[im 101/111  soft-tissue]
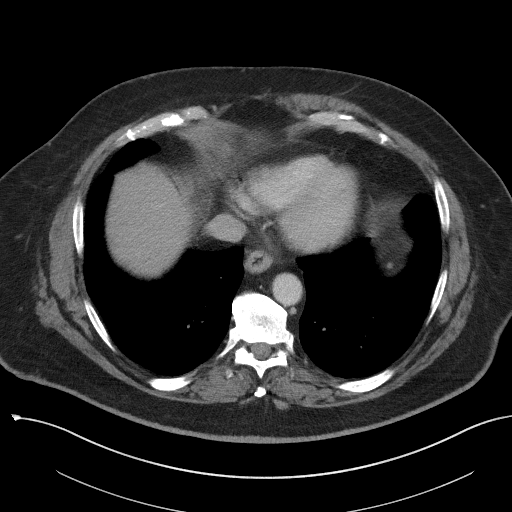
[im 101/111  lung]
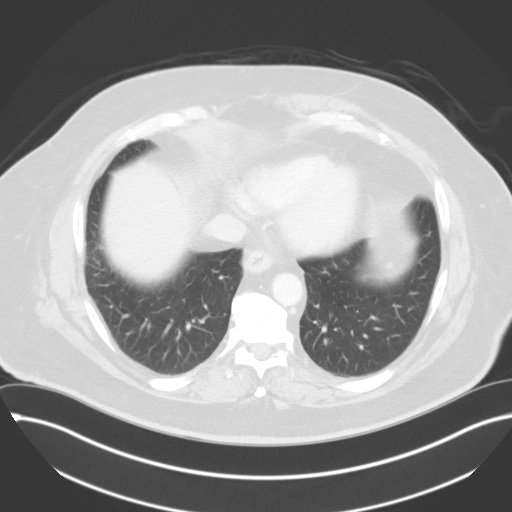
[im 101/111  bone]
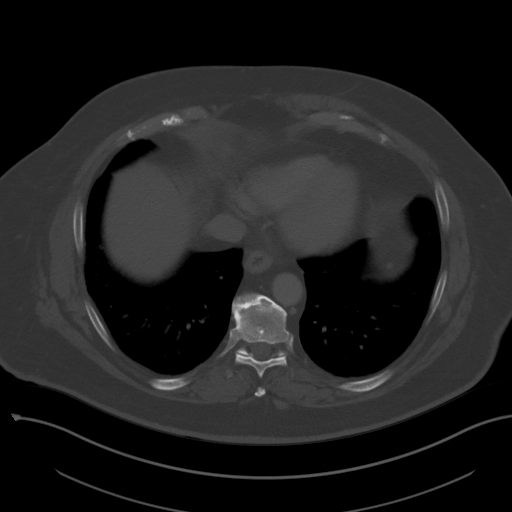

[Series 9: coronal st · coronal · 0.84mm/px · 3 of 111 slices shown]
[im 28/111  soft-tissue]
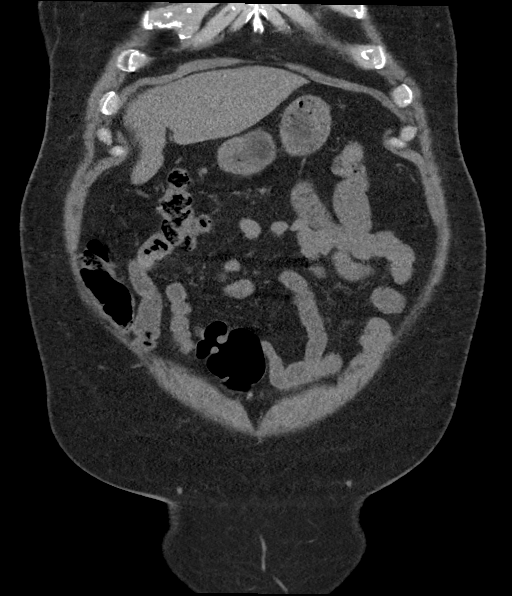
[im 56/111  soft-tissue]
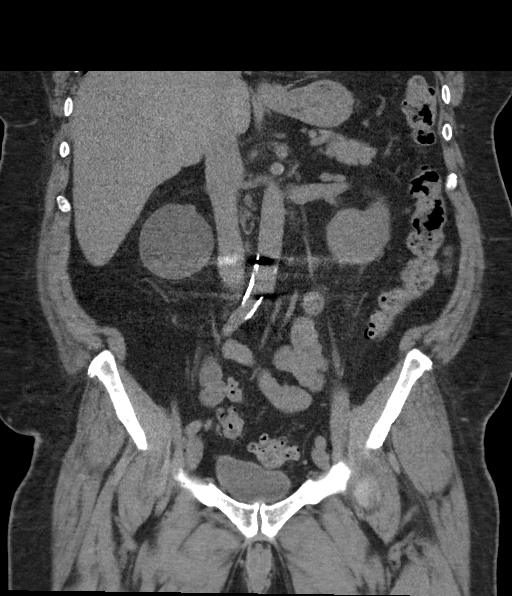
[im 83/111  soft-tissue]
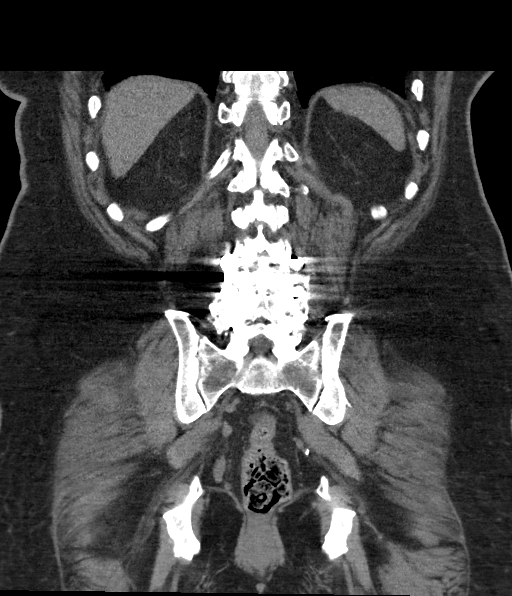

[12 of 46 positions shown; findings below may reference images not displayed]

FINDINGS: Lower Chest: No acute findings.

Hepatobiliary: No hepatic masses identified. Gallbladder is
unremarkable.

Pancreas:  No mass or inflammatory changes.

Spleen: Within normal limits in size and appearance.

Adrenals/Urinary Tract: 1 cm fat attenuation left adrenal mass is
stable, consistent with benign myelolipoma. No evidence of
urolithiasis or hydronephrosis. 6.5 cm simple cyst in the lower pole
of the right kidney. No complex cystic or solid renal masses
identified. Unremarkable unopacified urinary bladder.

Stomach/Bowel: No evidence of obstruction, inflammatory process or
abnormal fluid collections. Diverticulosis is seen mainly involving
the sigmoid colon, however there is no evidence of diverticulitis.
Normal appendix visualized.

Vascular/Lymphatic: No pathologically enlarged lymph nodes. No
abdominal aortic aneurysm. Aortic atherosclerosis.

Reproductive:  Normal size prostate.  Symmetric seminal vesicles.

Other:  None.

Musculoskeletal: No suspicious bone lesions identified. Lumbar spine
fusion hardware noted.
IMPRESSION: No evidence of metastatic disease or other significant abnormality.

Colonic diverticulosis, without radiographic evidence of
diverticulitis.

Stable tiny benign left adrenal myelolipoma.

## 2017-10-30 MED ORDER — IOHEXOL 350 MG/ML SOLN
100.0000 mL | Freq: Once | INTRAVENOUS | Status: AC | PRN
  Administered 2017-10-30: 100 mL via INTRAVENOUS

## 2017-10-30 MED ORDER — TECHNETIUM TC 99M MEDRONATE IV KIT
20.0000 | PACK | Freq: Once | INTRAVENOUS | Status: AC | PRN
  Administered 2017-10-30: 23.81 via INTRAVENOUS

## 2018-10-29 ENCOUNTER — Other Ambulatory Visit: Payer: Self-pay | Admitting: Internal Medicine

## 2018-10-29 DIAGNOSIS — R7989 Other specified abnormal findings of blood chemistry: Secondary | ICD-10-CM

## 2018-11-01 ENCOUNTER — Ambulatory Visit
Admission: RE | Admit: 2018-11-01 | Discharge: 2018-11-01 | Disposition: A | Discharge status: Home / Self Care | Payer: BC Managed Care – PPO | Source: Ambulatory Visit | Attending: Internal Medicine | Admitting: Internal Medicine

## 2018-11-01 ENCOUNTER — Other Ambulatory Visit: Payer: Self-pay

## 2018-11-01 DIAGNOSIS — R7989 Other specified abnormal findings of blood chemistry: Secondary | ICD-10-CM | POA: Insufficient documentation

## 2018-11-01 IMAGING — US ULTRASOUND ABDOMEN COMPLETE
1 series · 13 of 25 positions shown · non-contrast
Comparison: CT abdomen/pelvis [DATE], abdominal ultrasound
[DATE]

CLINICAL DATA: Elevated serum creatinine

EXAM:
ABDOMEN ULTRASOUND COMPLETE

[Series 1: ultrasound abdomen complete · 0.24mm/px · 13 of 103 slices shown]
[im 1/103]
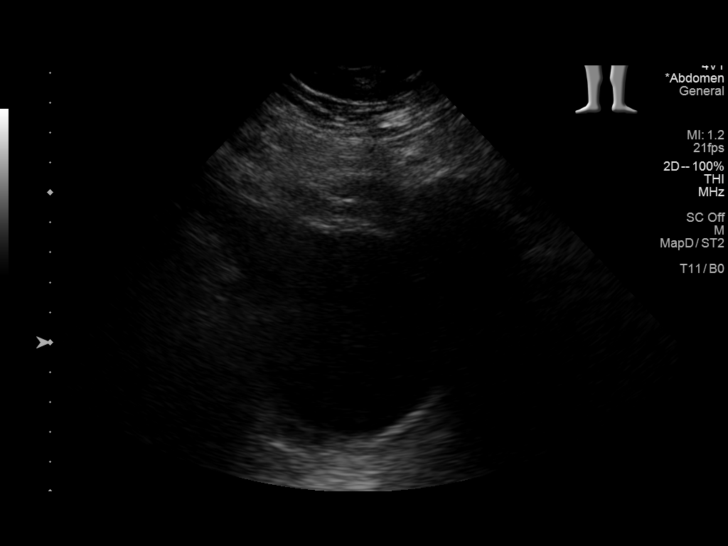
[im 9/103]
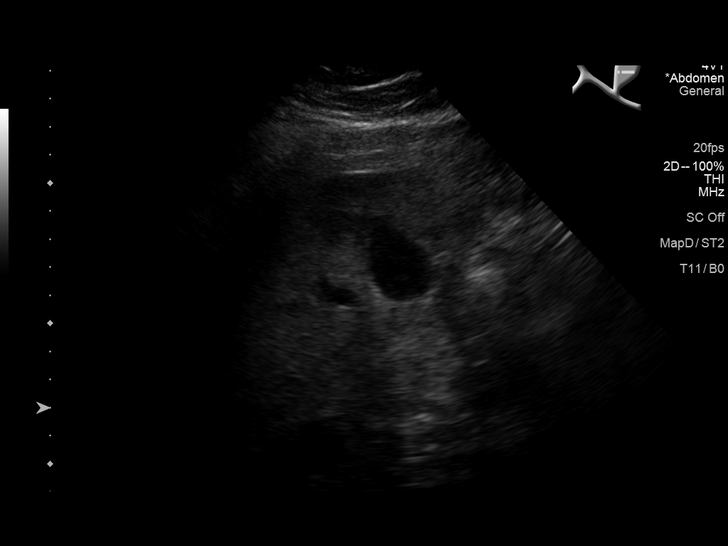
[im 18/103]
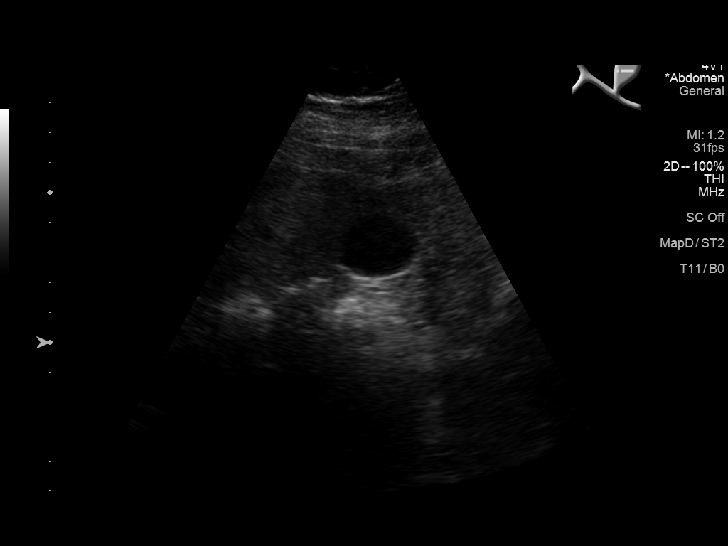
[im 26/103]
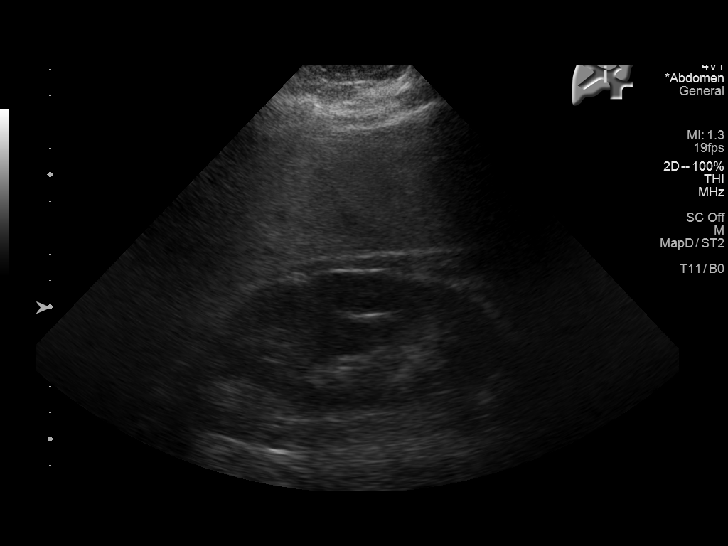
[im 35/103]
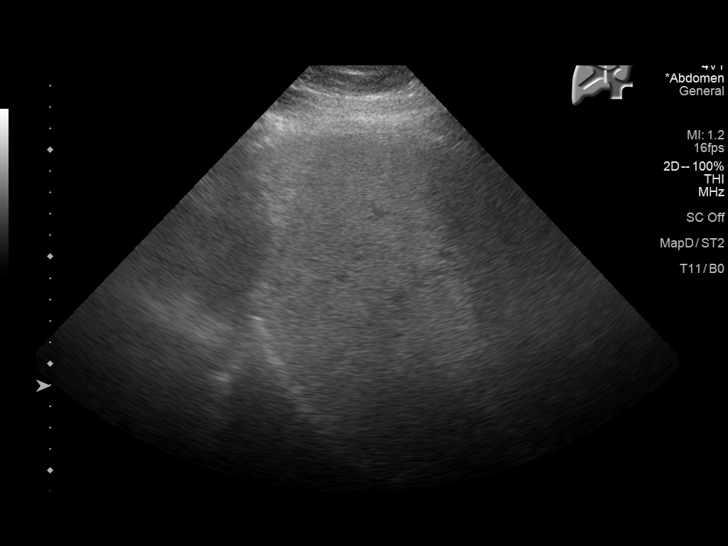
[im 43/103]
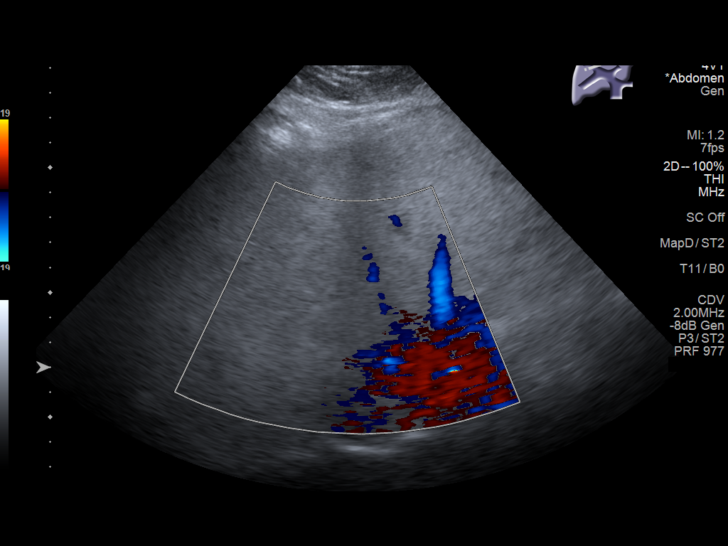
[im 52/103]
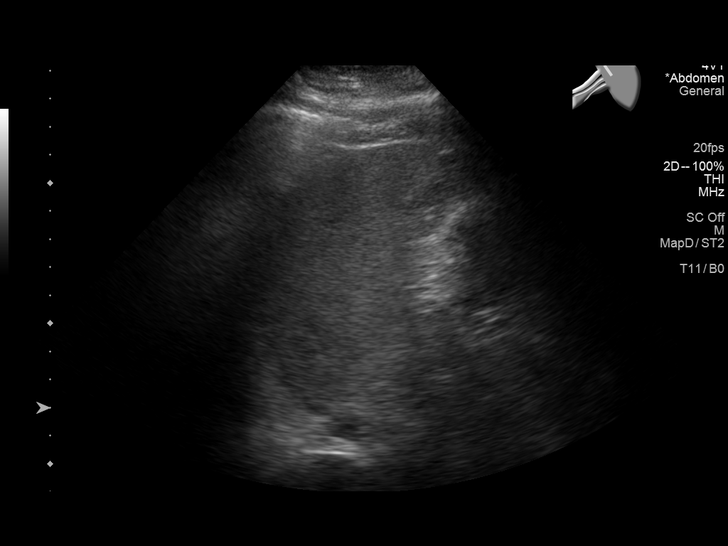
[im 60/103]
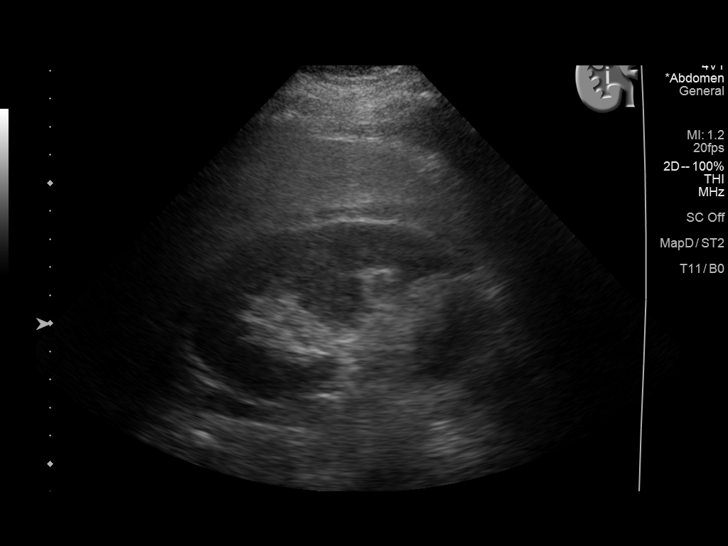
[im 69/103]
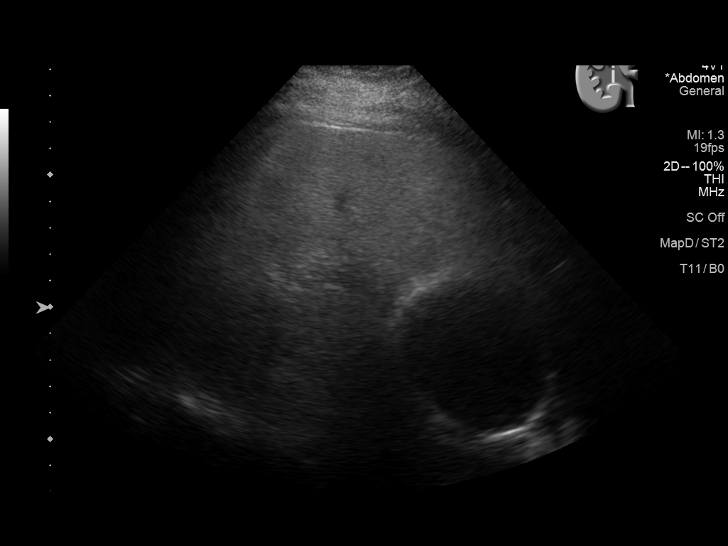
[im 77/103]
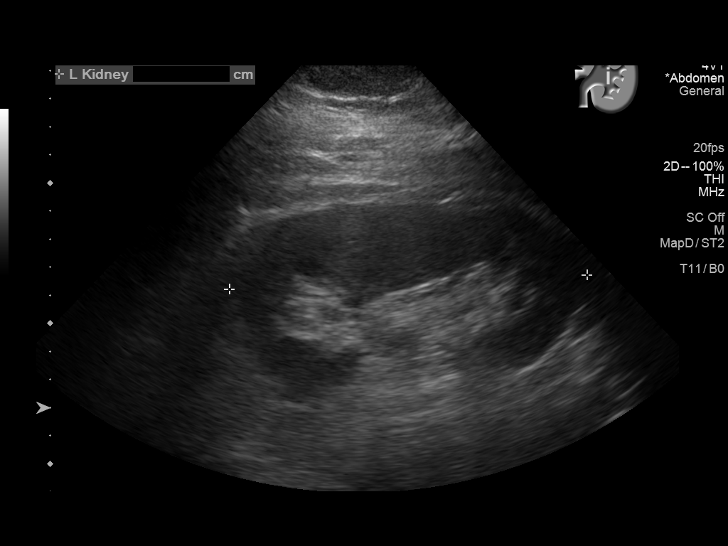
[im 86/103]
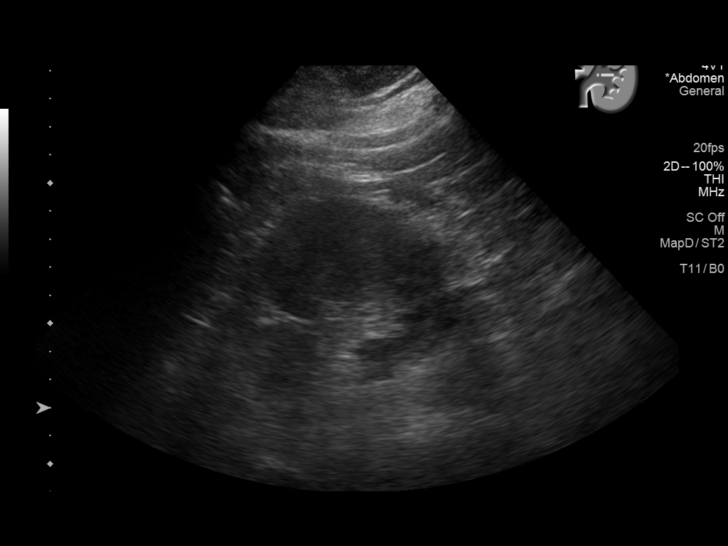
[im 94/103]
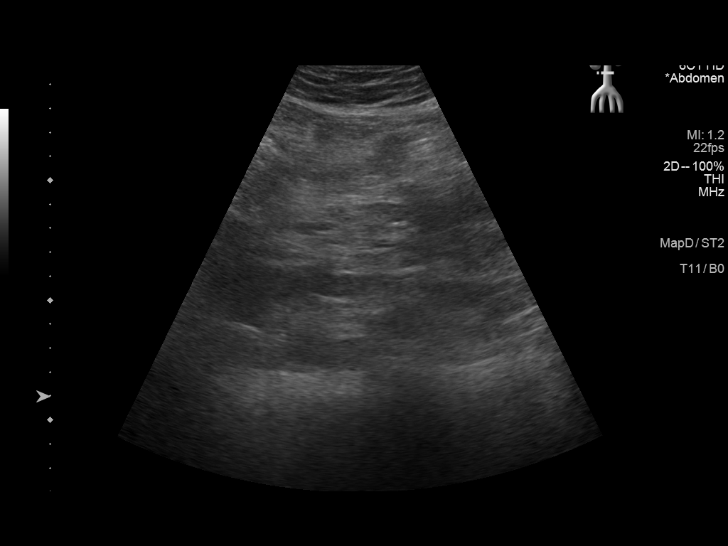
[im 103/103]
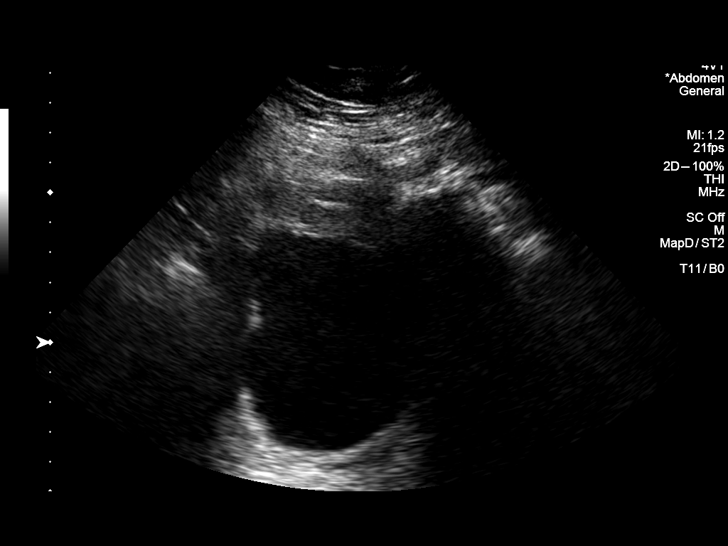

[13 of 25 positions shown; findings below may reference images not displayed]

FINDINGS: Gallbladder: No gallstones or wall thickening visualized. No
sonographic Murphy sign noted by sonographer.

Common bile duct: Diameter: 4-5 mm

Liver: Diffuse hyperechogenicity of the liver parenchyma. No focal
lesion identified. Interrogated main portal vein is patent on color
Doppler imaging with normal direction of blood flow towards the
liver.

IVC: No abnormality visualized.

Pancreas: Visualized portion unremarkable.

Spleen: Size and appearance within normal limits.

Right Kidney: Length: 11.5 cm. Echogenicity within normal limits. No
mass or hydronephrosis visualized. 5.9 x 6.4 x 5.5 cm exophytic
lower pole renal cyst.

Left Kidney: Length: 12.7 cm. Echogenicity within normal limits. No
mass or hydronephrosis visualized.

Abdominal aorta: The suprarenal abdominal aorta measures up to
cm in diameter.

Other findings: The imaged bladder is unremarkable.
IMPRESSION: Renal parenchymal echogenicity within normal limits. No
hydronephrosis or visualized stone.

Diffuse hyperechogenicity of the liver parenchyma. Findings may be
seen in the setting of hepatic steatosis or fibrosis. Consider
ultrasound elastography for further evaluation.

The suprarenal abdominal aorta measures up to 2.9 cm in diameter.
Ectatic abdominal aorta at risk for aneurysm development. Recommend
followup by ultrasound in 5 years. This recommendation follows ACR
consensus guidelines: White Paper of the ACR Incidental Findings
Committee II on Vascular Findings. [HOSPITAL] [ZU];

Aortic aneurysm NOS ([ZU]-[ZU])

## 2019-06-16 ENCOUNTER — Ambulatory Visit
Admission: RE | Admit: 2019-06-16 | Discharge: 2019-06-16 | Disposition: A | Discharge status: Home / Self Care | Payer: BC Managed Care – PPO | Attending: Internal Medicine | Admitting: Internal Medicine

## 2019-06-16 ENCOUNTER — Ambulatory Visit
Admission: RE | Admit: 2019-06-16 | Discharge: 2019-06-16 | Disposition: A | Discharge status: Home / Self Care | Payer: BC Managed Care – PPO | Source: Ambulatory Visit | Attending: Internal Medicine | Admitting: Internal Medicine

## 2019-06-16 ENCOUNTER — Other Ambulatory Visit: Payer: Self-pay | Admitting: Internal Medicine

## 2019-06-16 ENCOUNTER — Other Ambulatory Visit: Payer: Self-pay

## 2019-06-16 DIAGNOSIS — R0600 Dyspnea, unspecified: Secondary | ICD-10-CM | POA: Insufficient documentation

## 2019-06-16 IMAGING — DX DG CHEST 2V
2 series · 2 of 2 positions shown · non-contrast
Comparison: [DATE].

CLINICAL DATA: Dyspnea.

EXAM:
CHEST - 2 VIEW

[chest pa]
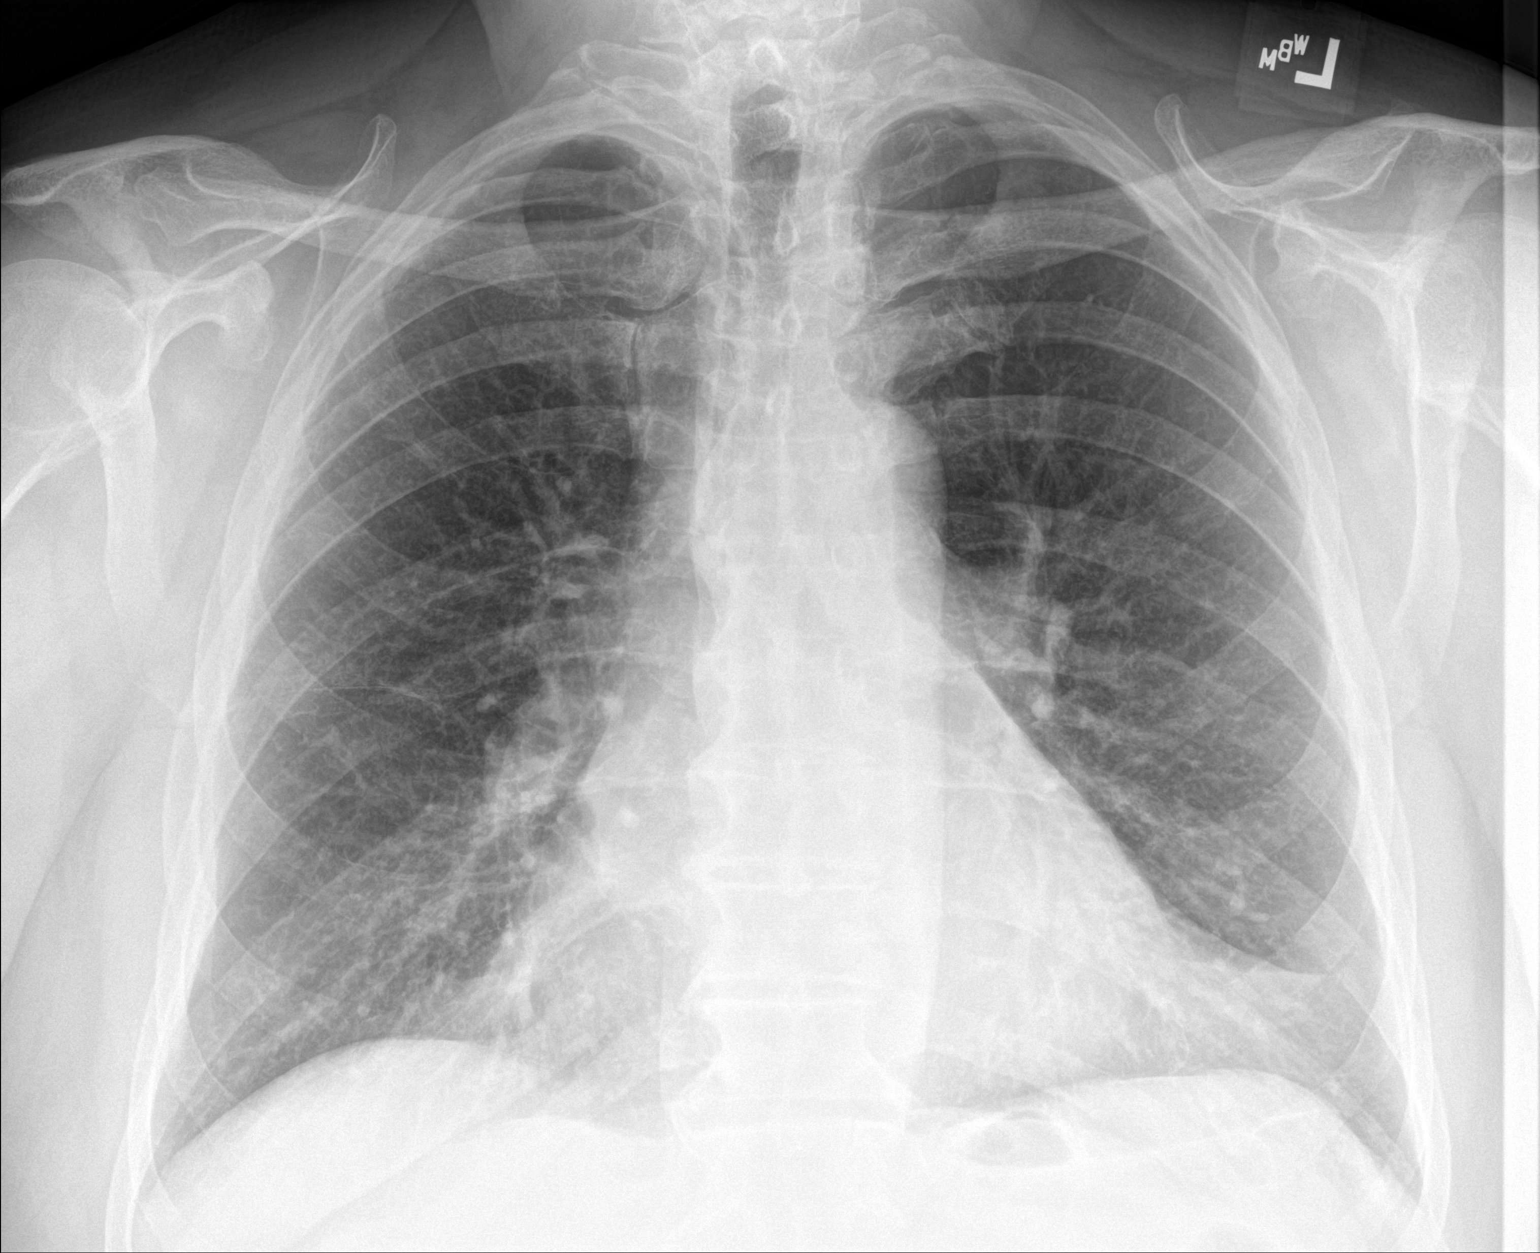

[chest lat]
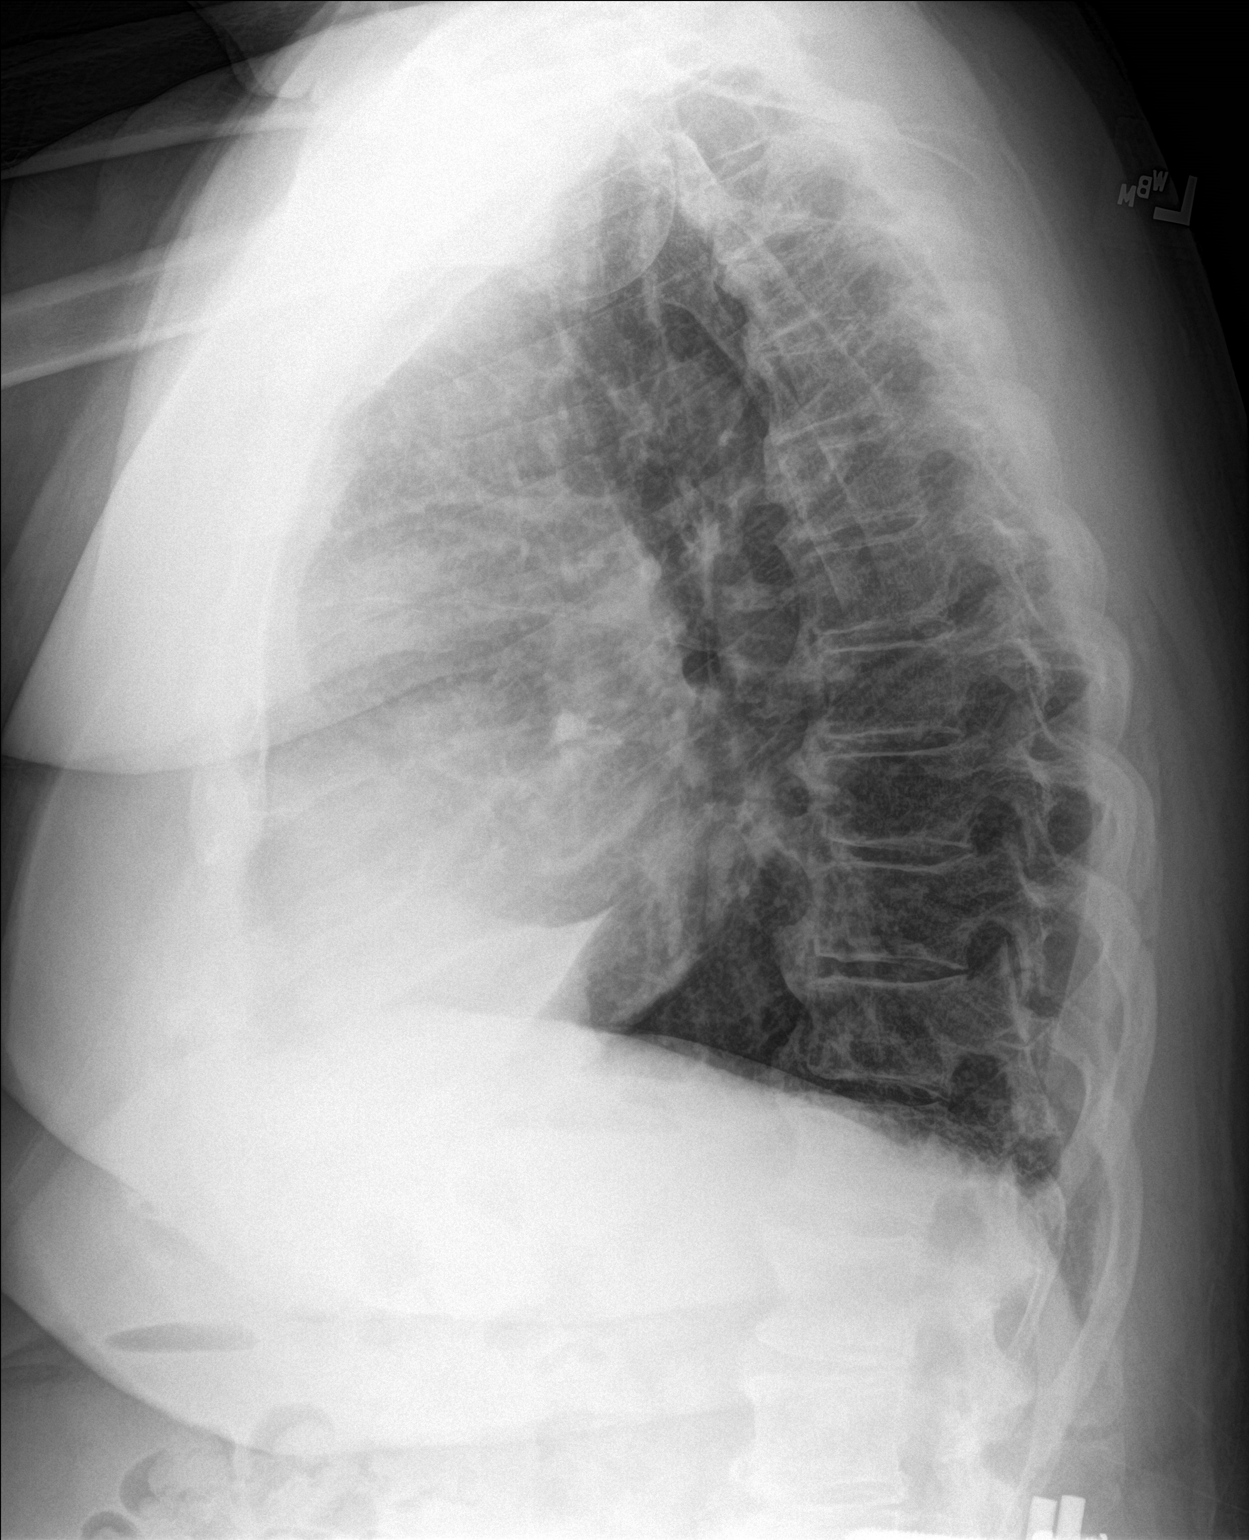

[2 of 2 positions shown; findings below may reference images not displayed]

FINDINGS: Stable cardiomediastinal silhouette. Both lungs are clear. No
pneumothorax pleural effusion is noted. The visualized skeletal
structures are unremarkable.
IMPRESSION: No active cardiopulmonary disease.

## 2019-09-06 ENCOUNTER — Ambulatory Visit: Payer: BC Managed Care – PPO | Admitting: Podiatry

## 2019-09-06 ENCOUNTER — Ambulatory Visit (INDEPENDENT_AMBULATORY_CARE_PROVIDER_SITE_OTHER): Payer: BC Managed Care – PPO

## 2019-09-06 ENCOUNTER — Encounter: Payer: Self-pay | Admitting: Podiatry

## 2019-09-06 ENCOUNTER — Other Ambulatory Visit: Payer: Self-pay

## 2019-09-06 DIAGNOSIS — L97512 Non-pressure chronic ulcer of other part of right foot with fat layer exposed: Secondary | ICD-10-CM | POA: Diagnosis not present

## 2019-09-06 DIAGNOSIS — E119 Type 2 diabetes mellitus without complications: Secondary | ICD-10-CM

## 2019-09-06 DIAGNOSIS — E08621 Diabetes mellitus due to underlying condition with foot ulcer: Secondary | ICD-10-CM | POA: Diagnosis not present

## 2019-09-06 DIAGNOSIS — Z6837 Body mass index (BMI) 37.0-37.9, adult: Secondary | ICD-10-CM | POA: Insufficient documentation

## 2019-09-06 DIAGNOSIS — L97402 Non-pressure chronic ulcer of unspecified heel and midfoot with fat layer exposed: Secondary | ICD-10-CM | POA: Diagnosis not present

## 2019-09-06 DIAGNOSIS — Z794 Long term (current) use of insulin: Secondary | ICD-10-CM

## 2019-09-06 DIAGNOSIS — L97422 Non-pressure chronic ulcer of left heel and midfoot with fat layer exposed: Secondary | ICD-10-CM

## 2019-09-06 MED ORDER — DOXYCYCLINE HYCLATE 100 MG PO TABS: 100.0000 mg | ORAL_TABLET | Freq: Two times a day (BID) | ORAL | 0 refills | Status: DC

## 2019-09-07 ENCOUNTER — Encounter: Payer: Self-pay | Admitting: Podiatry

## 2019-09-07 NOTE — Progress Notes (Addendum)
Subjective:  Patient ID: Christopher Escobar, male    DOB: 06/30/1955,  MRN: [id number]  Chief Complaint  Patient presents with  . Foot Ulcer    Patient presents today for callouses bilat feet and infected 2nd toe right  x 1 week    64 y.o. male presents for wound care.  Patient presents with complaints of right second digit distal tip ulceration that has progressively gotten worse.  Patient states that there is some redness associated with it.  Patient also has a left lateral plantar midfoot wound that he has been treating it by himself.  He has been doing local wound care with saline wet-to-dry dressing changes.  He states he is diabetic with sugars that are uncontrolled.  He does not recall the last A1c.  He states that he has not seen anyone else prior to see me.  He does not follow-up with the wound care center.  He denies any other acute complaints.   Review of Systems: Negative except as noted in the HPI. Denies N/V/F/Ch.  Past Medical History:  Diagnosis Date  . Cancer ([place])   . Diabetes mellitus without complication ([place]) 8338  . Hypertension 2008  . Personal history of colonic polyps   . Sleep apnea    not using cpap    Current Outpatient Medications:  .  pregabalin (LYRICA) 75 MG capsule, Take by mouth., Disp: , Rfl:  .  atorvastatin (LIPITOR) 40 MG tablet, Take 40 mg by mouth daily at 6 PM. , Disp: , Rfl:  .  doxycycline (VIBRA-TABS) 100 MG tablet, Take 1 tablet (100 mg total) by mouth 2 (two) times daily., Disp: 20 tablet, Rfl: 0 .  glimepiride (AMARYL) 4 MG tablet, Take 2 tablets (8 mg total) by mouth 2 (two) times daily., Disp: 60 tablet, Rfl: 3 .  insulin aspart (NOVOLOG) cartridge, Inject into the skin., Disp: , Rfl:  .  insulin detemir (LEVEMIR) 100 UNIT/ML injection, Inject 40 Units into the skin at bedtime. , Disp: , Rfl:  .  Insulin Glargine (BASAGLAR KWIKPEN) 100 UNIT/ML SOPN, Inject 18-20 Units into the skin See admin instructions. 18 units in the morning and 20  units in the evening, Disp: , Rfl:  .  lisinopril-hydrochlorothiazide (PRINZIDE,ZESTORETIC) 20-12.5 MG tablet, Take 1 tablet every morning by mouth. , Disp: , Rfl:  .  metoprolol succinate (TOPROL-XL) 25 MG 24 hr tablet, Take 25 mg every morning by mouth. , Disp: , Rfl:  .  Multiple Vitamin (ONE-A-DAY MENS PO), Take 1 tablet by mouth daily., Disp: , Rfl:  .  NOVOLOG MIX 70/30 FLEXPEN (70-30) 100 UNIT/ML FlexPen, , Disp: , Rfl:  .  oxyCODONE-acetaminophen (PERCOCET) 7.5-325 MG tablet, Take 1 tablet by mouth 5 (five) times daily., Disp: , Rfl:   Social History   Tobacco Use  Smoking Status Former Smoker  . Packs/day: 1.00  . Years: 30.00  . Pack years: 30.00  . Types: Cigarettes  . Quit date: 02/03/2007  . Years since quitting: 12.6  Smokeless Tobacco Never Used    No Known Allergies Objective:  There were no vitals filed for this visit. There is no height or weight on file to calculate BMI. Constitutional Well developed. Well nourished.  Vascular Dorsalis pedis pulses faintly palpable bilaterally. Posterior tibial pulses faintly palpable bilaterally. Capillary refill normal to all digits.  No cyanosis or clubbing noted. Pedal hair growth normal.  Neurologic Normal speech. Oriented to person, place, and time. Protective sensation absent  Dermatologic Wound Location: Right second distal  tip ulceration with fat layer exposed does not probe down to bone no purulent drainage was expressed.  Erythema up to the MPJ. Left plantar midfoot wound probes down to muscle layer but does not probe down to bone.  No signs of infection.  No purulent drainage noted.  No malodor present. Wound Base: Mixed Granular/Fibrotic Peri-wound: Calloused Exudate: Scant/small amount Serosanguinous exudate Wound Measurements: -See below  Orthopedic: No pain to palpation either foot.   Radiographs: 3 views of skeletally mature adult foot bilateral: No signs of osteomyelitis noted.  No cortical irregularity  or destruction of the second toe on the right or the plantar midfoot wound.  No soft tissue emphysema noted.  No foreign body noted. Assessment:   1. Right second toe ulcer, with fat layer exposed ([place])   2. Diabetic ulcer of left midfoot associated with diabetes mellitus due to underlying condition, with fat layer exposed ([place])   3. Type 2 diabetes mellitus without complication, with long-term current use of insulin ([place])    Plan:  Patient was evaluated and treated and all questions answered.  Ulcer right second distal tip with fat layer exposed and left lateral plantar midfoot with fat layer exposed -Debridement as below. -Dressed with  Betadine wet-to-dry dressing DSD. -Continue off-loading with bilateral surgical shoe. -Doxycycline was dispensed for skin and soft tissue prophylaxis -He will need to take time off from work to take the pressure away from the ulceration.  If he is unable to do so there is a chance that he may get worsening infection of the foot and may end up losing part of his foot.   Procedure: Excisional Debridement of Wound right second distal tip ulcer fat layer exposed Tool: Sharp chisel blade/tissue nipper Rationale: Removal of non-viable soft tissue from the wound to promote healing.  Anesthesia: none Pre-Debridement Wound Measurements: 0.5 cm x 0.5 cm x 0.3 cm  Post-Debridement Wound Measurements: 0.6 cm x 0.6 cm x 0.3 cm  Type of Debridement: Sharp Excisional Tissue Removed: Non-viable soft tissue Blood loss: Minimal (<50cc) Depth of Debridement: subcutaneous tissue. Technique: Sharp excisional debridement to bleeding, viable wound base.  Wound Progress: This is initial encounter.  I will continue to monitor the progression of the wound. Site healing conversation 7 Dressing: Dry, sterile, compression dressing. Disposition: Patient tolerated procedure well. Patient to return in 1 week for follow-up.  Procedure: Excisional Debridement of Wound left lateral  plantar midfoot wound Tool: Sharp chisel blade/tissue nipper Rationale: Removal of non-viable soft tissue from the wound to promote healing.  Anesthesia: none Pre-Debridement Wound Measurements: 2 cm x 2 cm x 0.5 cm Post-Debridement Wound Measurements: 2.1 cm x 2.1 cm 0.5 cm Type of Debridement: Sharp Excisional Tissue Removed: Non-viable soft tissue Blood loss: Minimal (<50cc) Depth of Debridement: subcutaneous tissue. Technique: Sharp excisional debridement to bleeding, viable wound base.  Wound Progress: This is initial encounter.  I will continue to monitor the progression of the wound. Site healing conversation 7 Dressing: Dry, sterile, compression dressing. Disposition: Patient tolerated procedure well. Patient to return in 1 week for follow-up.  No follow-ups on file.

## 2019-09-09 ENCOUNTER — Telehealth: Payer: Self-pay | Admitting: Podiatry

## 2019-09-09 NOTE — Telephone Encounter (Signed)
He will need to stop working.  Anything the to take the pressure away from the ulceration.  I have addended my note to say that he will need to stop working.  Thank you

## 2019-09-09 NOTE — Telephone Encounter (Signed)
I was wondering for pts disability if he was advised to stop working and when he will be able to resume work. If he was advised to stop working, would you please amend his notes.

## 2019-09-12 ENCOUNTER — Ambulatory Visit: Payer: BLUE CROSS/BLUE SHIELD | Admitting: Podiatry

## 2019-09-13 ENCOUNTER — Inpatient Hospital Stay (HOSPITAL_COMMUNITY)
Admission: EM | Admit: 2019-09-13 | Discharge: 2019-09-16 | DRG: 617 | Disposition: A | Discharge status: Home / Self Care | Payer: BC Managed Care – PPO | Source: Ambulatory Visit | Attending: Internal Medicine | Admitting: Internal Medicine

## 2019-09-13 ENCOUNTER — Ambulatory Visit: Payer: BC Managed Care – PPO | Admitting: Podiatry

## 2019-09-13 ENCOUNTER — Inpatient Hospital Stay (HOSPITAL_COMMUNITY): Payer: BC Managed Care – PPO

## 2019-09-13 ENCOUNTER — Emergency Department (HOSPITAL_COMMUNITY): Payer: BC Managed Care – PPO

## 2019-09-13 ENCOUNTER — Encounter: Payer: Self-pay | Admitting: Podiatry

## 2019-09-13 ENCOUNTER — Other Ambulatory Visit: Payer: Self-pay

## 2019-09-13 ENCOUNTER — Encounter (HOSPITAL_COMMUNITY): Payer: Self-pay | Admitting: Emergency Medicine

## 2019-09-13 DIAGNOSIS — L97512 Non-pressure chronic ulcer of other part of right foot with fat layer exposed: Secondary | ICD-10-CM | POA: Diagnosis not present

## 2019-09-13 DIAGNOSIS — Z8719 Personal history of other diseases of the digestive system: Secondary | ICD-10-CM

## 2019-09-13 DIAGNOSIS — M86671 Other chronic osteomyelitis, right ankle and foot: Secondary | ICD-10-CM | POA: Diagnosis not present

## 2019-09-13 DIAGNOSIS — Z794 Long term (current) use of insulin: Secondary | ICD-10-CM

## 2019-09-13 DIAGNOSIS — G4733 Obstructive sleep apnea (adult) (pediatric): Secondary | ICD-10-CM | POA: Diagnosis present

## 2019-09-13 DIAGNOSIS — L089 Local infection of the skin and subcutaneous tissue, unspecified: Secondary | ICD-10-CM | POA: Diagnosis not present

## 2019-09-13 DIAGNOSIS — Z6837 Body mass index (BMI) 37.0-37.9, adult: Secondary | ICD-10-CM

## 2019-09-13 DIAGNOSIS — E119 Type 2 diabetes mellitus without complications: Secondary | ICD-10-CM

## 2019-09-13 DIAGNOSIS — E1165 Type 2 diabetes mellitus with hyperglycemia: Secondary | ICD-10-CM | POA: Diagnosis present

## 2019-09-13 DIAGNOSIS — E871 Hypo-osmolality and hyponatremia: Secondary | ICD-10-CM | POA: Diagnosis present

## 2019-09-13 DIAGNOSIS — N179 Acute kidney failure, unspecified: Secondary | ICD-10-CM | POA: Diagnosis present

## 2019-09-13 DIAGNOSIS — M19071 Primary osteoarthritis, right ankle and foot: Secondary | ICD-10-CM | POA: Diagnosis present

## 2019-09-13 DIAGNOSIS — Z87891 Personal history of nicotine dependence: Secondary | ICD-10-CM

## 2019-09-13 DIAGNOSIS — M868X7 Other osteomyelitis, ankle and foot: Secondary | ICD-10-CM | POA: Diagnosis present

## 2019-09-13 DIAGNOSIS — Z20822 Contact with and (suspected) exposure to covid-19: Secondary | ICD-10-CM | POA: Diagnosis present

## 2019-09-13 DIAGNOSIS — E11628 Type 2 diabetes mellitus with other skin complications: Secondary | ICD-10-CM | POA: Diagnosis not present

## 2019-09-13 DIAGNOSIS — Z9119 Patient's noncompliance with other medical treatment and regimen: Secondary | ICD-10-CM

## 2019-09-13 DIAGNOSIS — L97519 Non-pressure chronic ulcer of other part of right foot with unspecified severity: Secondary | ICD-10-CM | POA: Diagnosis present

## 2019-09-13 DIAGNOSIS — I10 Essential (primary) hypertension: Secondary | ICD-10-CM | POA: Diagnosis present

## 2019-09-13 DIAGNOSIS — L97422 Non-pressure chronic ulcer of left heel and midfoot with fat layer exposed: Secondary | ICD-10-CM

## 2019-09-13 DIAGNOSIS — L03031 Cellulitis of right toe: Secondary | ICD-10-CM | POA: Diagnosis present

## 2019-09-13 DIAGNOSIS — E11621 Type 2 diabetes mellitus with foot ulcer: Secondary | ICD-10-CM | POA: Diagnosis present

## 2019-09-13 DIAGNOSIS — M7730 Calcaneal spur, unspecified foot: Secondary | ICD-10-CM | POA: Diagnosis present

## 2019-09-13 DIAGNOSIS — R739 Hyperglycemia, unspecified: Secondary | ICD-10-CM

## 2019-09-13 DIAGNOSIS — D126 Benign neoplasm of colon, unspecified: Secondary | ICD-10-CM | POA: Diagnosis present

## 2019-09-13 DIAGNOSIS — J9811 Atelectasis: Secondary | ICD-10-CM | POA: Diagnosis present

## 2019-09-13 DIAGNOSIS — E1169 Type 2 diabetes mellitus with other specified complication: Secondary | ICD-10-CM | POA: Diagnosis present

## 2019-09-13 DIAGNOSIS — E785 Hyperlipidemia, unspecified: Secondary | ICD-10-CM | POA: Diagnosis present

## 2019-09-13 DIAGNOSIS — E08621 Diabetes mellitus due to underlying condition with foot ulcer: Secondary | ICD-10-CM

## 2019-09-13 DIAGNOSIS — E1140 Type 2 diabetes mellitus with diabetic neuropathy, unspecified: Secondary | ICD-10-CM | POA: Diagnosis present

## 2019-09-13 DIAGNOSIS — M86071 Acute hematogenous osteomyelitis, right ankle and foot: Secondary | ICD-10-CM | POA: Diagnosis not present

## 2019-09-13 DIAGNOSIS — Z89431 Acquired absence of right foot: Secondary | ICD-10-CM

## 2019-09-13 DIAGNOSIS — Z79899 Other long term (current) drug therapy: Secondary | ICD-10-CM

## 2019-09-13 LAB — CBC WITH DIFFERENTIAL/PLATELET
Abs Immature Granulocytes: 0.09 10*3/uL — ABNORMAL HIGH (ref 0.00–0.07)
Basophils Absolute: 0.1 10*3/uL (ref 0.0–0.1)
Basophils Relative: 1 %
Eosinophils Absolute: 0.3 10*3/uL (ref 0.0–0.5)
Eosinophils Relative: 2 %
HCT: 42.2 % (ref 39.0–52.0)
Hemoglobin: 13.6 g/dL (ref 13.0–17.0)
Immature Granulocytes: 1 %
Lymphocytes Relative: 22 %
Lymphs Abs: 3.2 10*3/uL (ref 0.7–4.0)
MCH: 29.2 pg (ref 26.0–34.0)
MCHC: 32.2 g/dL (ref 30.0–36.0)
MCV: 90.8 fL (ref 80.0–100.0)
Monocytes Absolute: 1.4 10*3/uL — ABNORMAL HIGH (ref 0.1–1.0)
Monocytes Relative: 10 %
Neutro Abs: 9.5 10*3/uL — ABNORMAL HIGH (ref 1.7–7.7)
Neutrophils Relative %: 64 %
Platelets: 279 10*3/uL (ref 150–400)
RBC: 4.65 MIL/uL (ref 4.22–5.81)
RDW: 13.2 % (ref 11.5–15.5)
WBC: 14.6 10*3/uL — ABNORMAL HIGH (ref 4.0–10.5)
nRBC: 0 % (ref 0.0–0.2)

## 2019-09-13 LAB — CBC
HCT: 40 % (ref 39.0–52.0)
Hemoglobin: 13 g/dL (ref 13.0–17.0)
MCH: 29.6 pg (ref 26.0–34.0)
MCHC: 32.5 g/dL (ref 30.0–36.0)
MCV: 91.1 fL (ref 80.0–100.0)
Platelets: 269 10*3/uL (ref 150–400)
RBC: 4.39 MIL/uL (ref 4.22–5.81)
RDW: 13.2 % (ref 11.5–15.5)
WBC: 14.7 10*3/uL — ABNORMAL HIGH (ref 4.0–10.5)
nRBC: 0 % (ref 0.0–0.2)

## 2019-09-13 LAB — COMPREHENSIVE METABOLIC PANEL
ALT: 11 U/L (ref 0–44)
AST: 11 U/L — ABNORMAL LOW (ref 15–41)
Albumin: 2.9 g/dL — ABNORMAL LOW (ref 3.5–5.0)
Alkaline Phosphatase: 69 U/L (ref 38–126)
Anion gap: 14 (ref 5–15)
BUN: 24 mg/dL — ABNORMAL HIGH (ref 8–23)
CO2: 20 mmol/L — ABNORMAL LOW (ref 22–32)
Calcium: 9.3 mg/dL (ref 8.9–10.3)
Chloride: 99 mmol/L (ref 98–111)
Creatinine, Ser: 1.27 mg/dL — ABNORMAL HIGH (ref 0.61–1.24)
GFR calc Af Amer: >60 mL/min (ref >60)
GFR calc non Af Amer: 58 mL/min — ABNORMAL LOW (ref >60)
Glucose, Bld: 431 mg/dL — ABNORMAL HIGH (ref 70–99)
Potassium: 4.1 mmol/L (ref 3.5–5.1)
Sodium: 133 mmol/L — ABNORMAL LOW (ref 135–145)
Total Bilirubin: 0.8 mg/dL (ref 0.3–1.2)
Total Protein: 7.3 g/dL (ref 6.5–8.1)

## 2019-09-13 LAB — LACTIC ACID, PLASMA
Lactic Acid, Venous: 2.3 mmol/L (ref 0.5–1.9)
Lactic Acid, Venous: 2.2 mmol/L (ref 0.5–1.9)

## 2019-09-13 LAB — HEMOGLOBIN A1C
Hgb A1c MFr Bld: 10.7 % — ABNORMAL HIGH (ref 4.8–5.6)
Mean Plasma Glucose: 260.39 mg/dL

## 2019-09-13 LAB — MAGNESIUM: Magnesium: 1.4 mg/dL — ABNORMAL LOW (ref 1.7–2.4)

## 2019-09-13 LAB — GLUCOSE, CAPILLARY
Glucose-Capillary: 313 mg/dL — ABNORMAL HIGH (ref 70–99)
Glucose-Capillary: 280 mg/dL — ABNORMAL HIGH (ref 70–99)

## 2019-09-13 LAB — CREATININE, SERUM
Creatinine, Ser: 1.21 mg/dL (ref 0.61–1.24)
GFR calc Af Amer: >60 mL/min (ref >60)
GFR calc non Af Amer: >60 mL/min (ref >60)

## 2019-09-13 LAB — PHOSPHORUS: Phosphorus: 3.7 mg/dL (ref 2.5–4.6)

## 2019-09-13 LAB — SARS CORONAVIRUS 2 BY RT PCR (HOSPITAL ORDER, PERFORMED IN ~~LOC~~ HOSPITAL LAB): SARS Coronavirus 2: NEGATIVE

## 2019-09-13 LAB — HIV ANTIBODY (ROUTINE TESTING W REFLEX): HIV Screen 4th Generation wRfx: NONREACTIVE

## 2019-09-13 IMAGING — CR DG FOOT COMPLETE 3+V*R*
3 series · 3 of 3 positions shown · non-contrast
Comparison: None.

CLINICAL DATA: Infection second digit

EXAM:
RIGHT FOOT COMPLETE - 3+ VIEW

[foot ap]
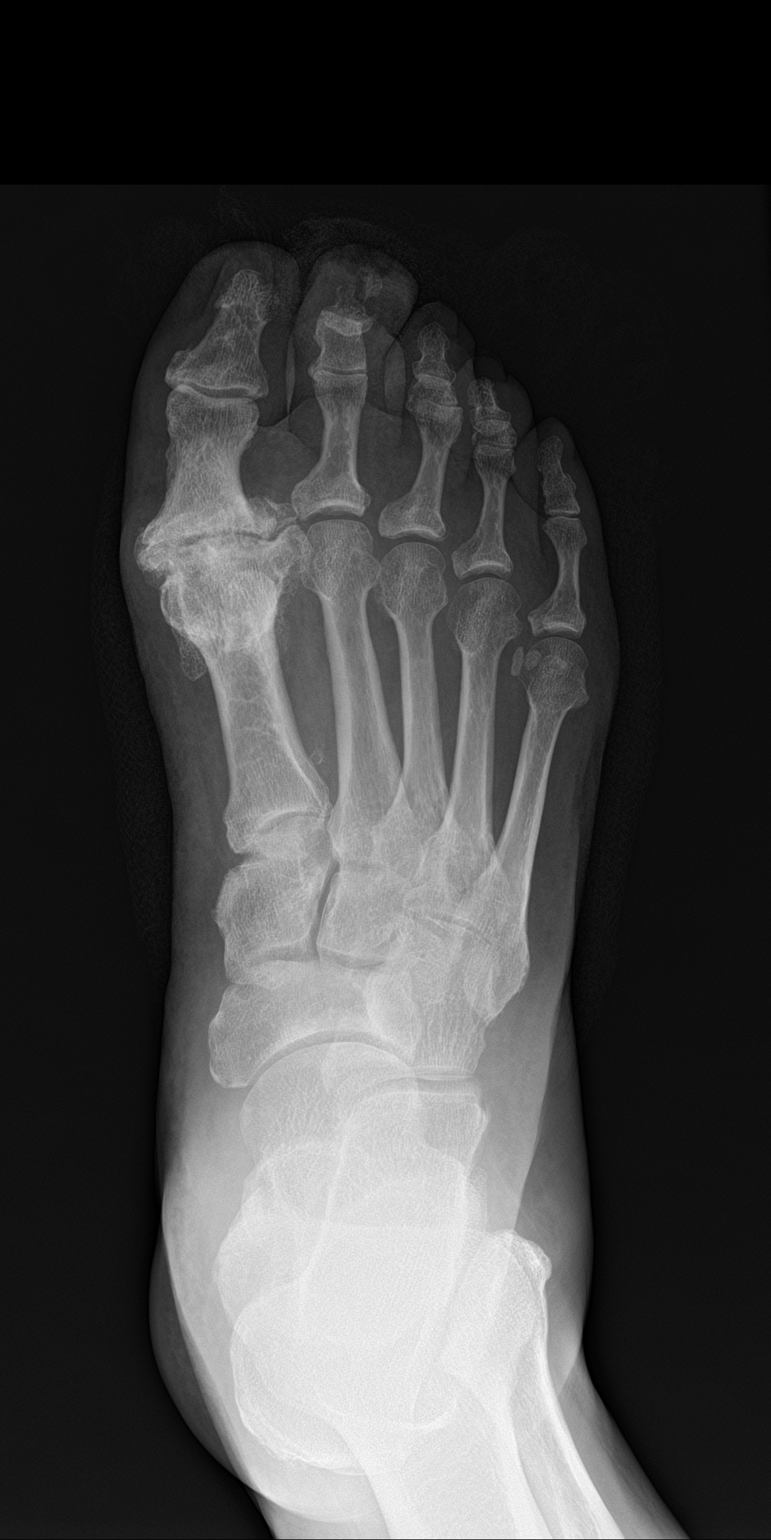

[foot obl]
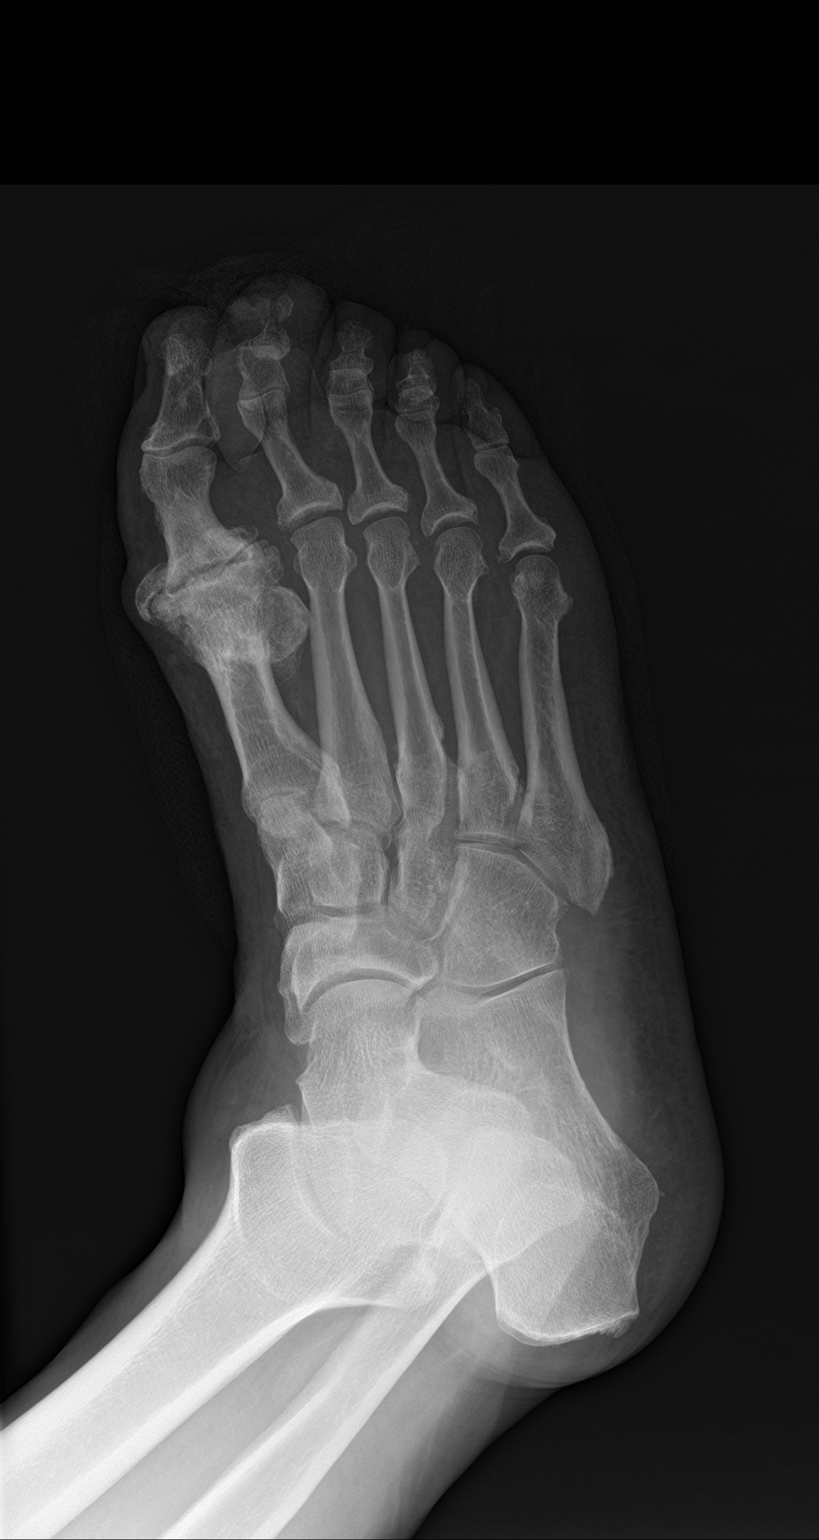

[foot lat]
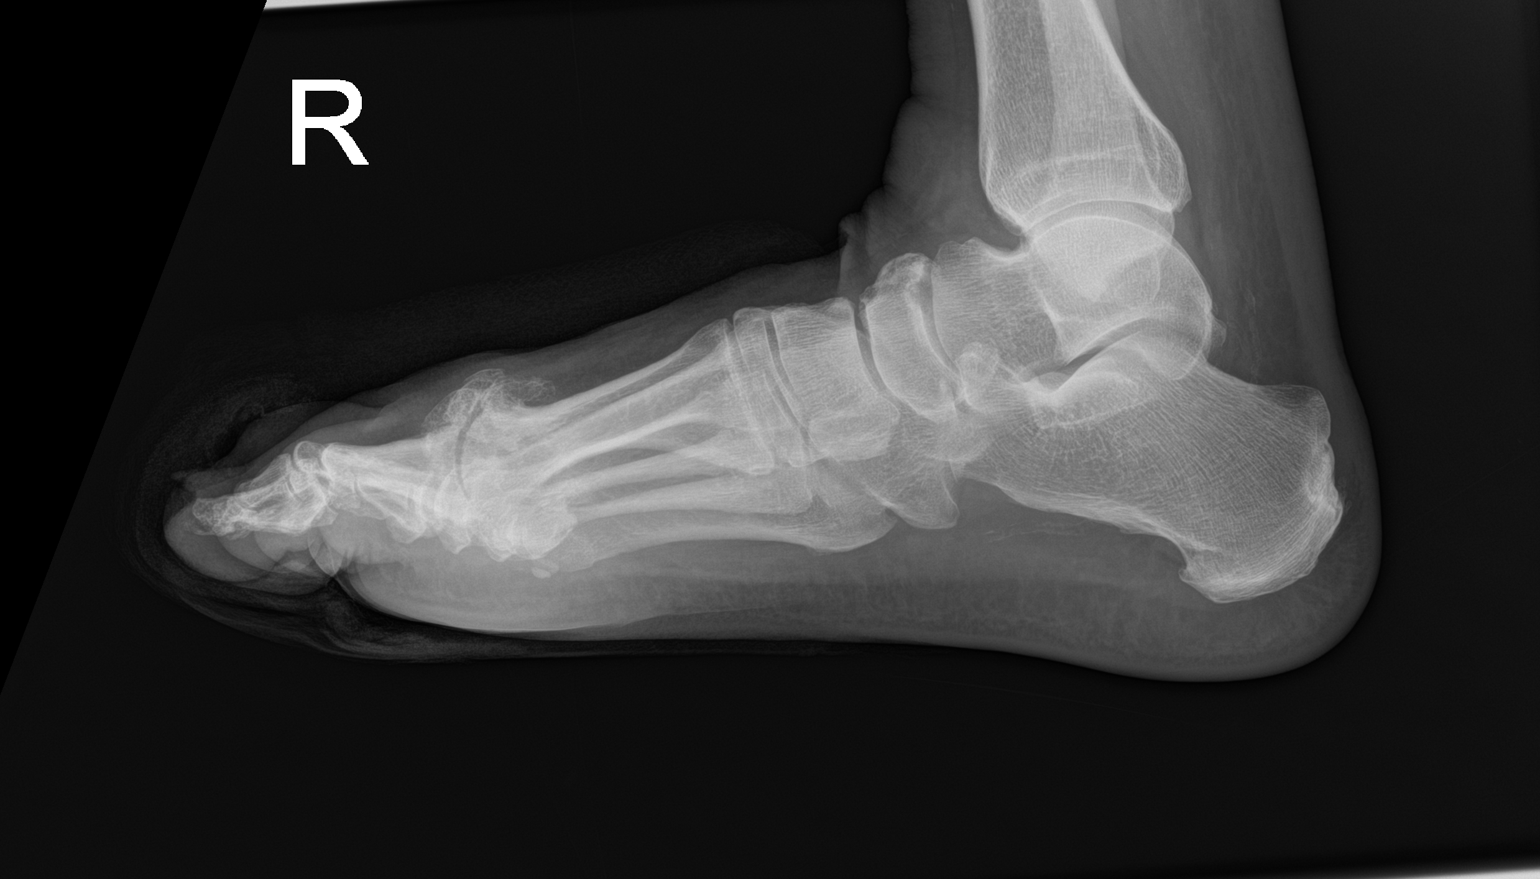

[3 of 3 positions shown; findings below may reference images not displayed]

FINDINGS: Frontal, oblique, and lateral views were obtained. There is
overlying bandage at the level of the second distal phalanx. There
is apparent cortical irregularity along the lateral aspect of the
distal aspect of the second distal phalanx, best seen on frontal
view. A small focus of bony destruction in this area is questioned.
This finding is not confirmed on the lateral view. No other areas
suggesting potential bony destruction seen. No fracture or
dislocation. There is marked narrowing and bony overgrowth at the
first MTP joint with subchondral cystic change. No erosion in this
area. Other joint spaces appear unremarkable. Note that there is
spurring in the dorsal midfoot. There are small posterior and
inferior calcaneal spurs.
IMPRESSION: Suggestion of a focus of potential bony destruction along the
lateral most aspect of the distal portion of the second distal
phalanx seen only on frontal view. Overlying bandage makes
evaluation of this area difficult. It may be reasonable to consider
reimaging after removal of the bandage given artifact from the
bandage. No other findings suggesting potential bony destruction.

No fracture or dislocation. Severe osteoarthritis in the first MTP
joint. There is spurring in the dorsal midfoot. There are small
calcaneal spurs.

## 2019-09-13 IMAGING — DX DG CHEST 2V
2 series · 2 of 2 positions shown · non-contrast
Comparison: [DATE].

CLINICAL DATA: Toe infection.

EXAM:
CHEST - 2 VIEW

[w chest pa]
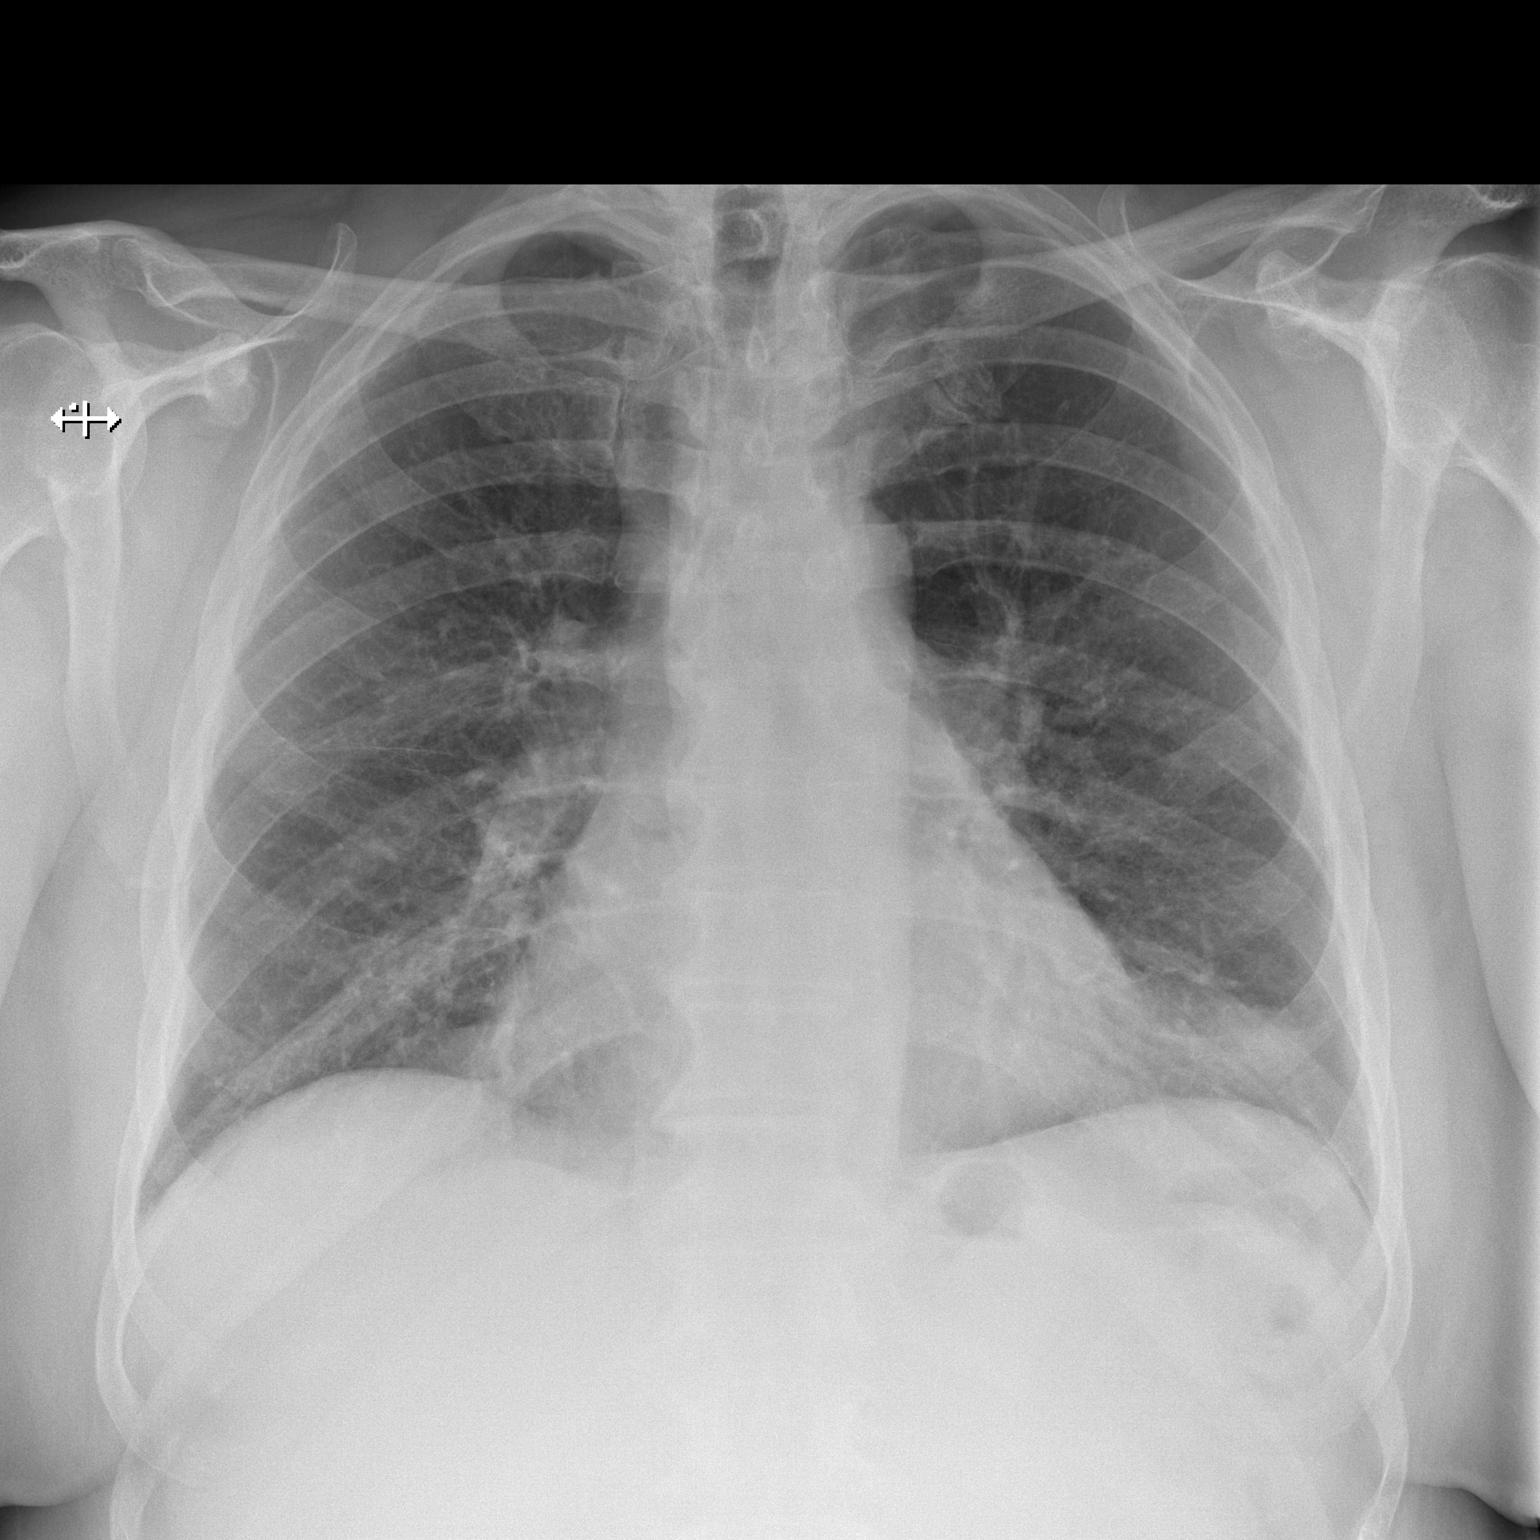

[w chest lat]
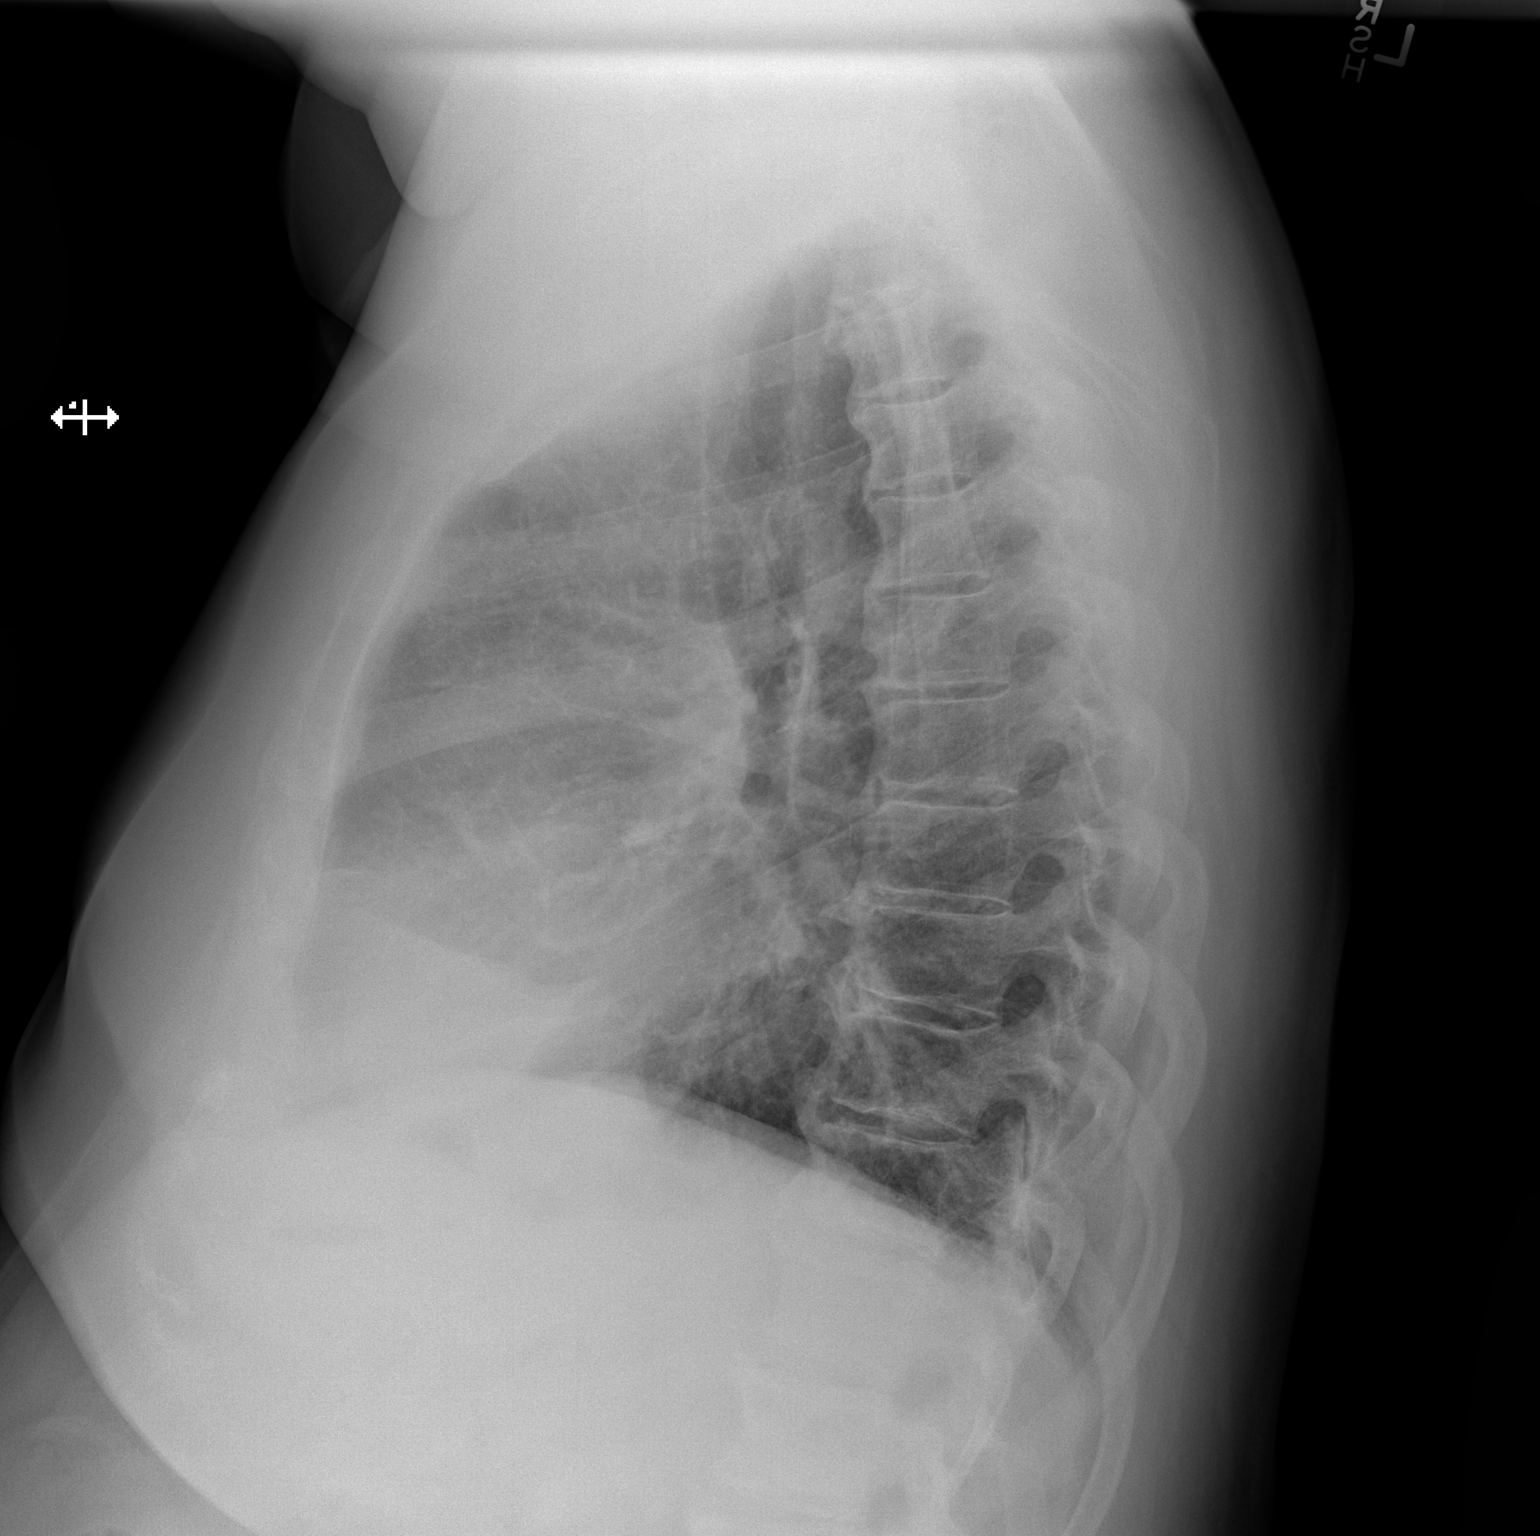

[2 of 2 positions shown; findings below may reference images not displayed]

FINDINGS: Mediastinum and hilar structures normal. Prominent epicardial fat
pads again. Heart size normal. Low lung volumes mild basilar
atelectasis. Mild bibasilar atelectasis. No pleural effusion or
pneumothorax. Degenerative change thoracic spine.
IMPRESSION: Low lung volumes.  With mild basilar atelectasis.

## 2019-09-13 MED ORDER — PIPERACILLIN-TAZOBACTAM 3.375 G IVPB: 3.3750 g | Freq: Three times a day (TID) | INTRAVENOUS | Status: DC

## 2019-09-13 MED ORDER — METOPROLOL SUCCINATE ER 25 MG PO TB24
25.0000 mg | ORAL_TABLET | ORAL | Status: DC
  Administered 2019-09-14 – 2019-09-16 (×2): 25 mg via ORAL
  Filled 2019-09-13 (×3): qty 1

## 2019-09-13 MED ORDER — INSULIN ASPART 100 UNIT/ML ~~LOC~~ SOLN
0.0000 [IU] | Freq: Three times a day (TID) | SUBCUTANEOUS | Status: DC
  Administered 2019-09-13: 11 [IU] via SUBCUTANEOUS
  Administered 2019-09-14: 5 [IU] via SUBCUTANEOUS
  Administered 2019-09-14: 8 [IU] via SUBCUTANEOUS
  Administered 2019-09-14: 3 [IU] via SUBCUTANEOUS
  Administered 2019-09-15 – 2019-09-16 (×3): 5 [IU] via SUBCUTANEOUS
  Administered 2019-09-16: 3 [IU] via SUBCUTANEOUS

## 2019-09-13 MED ORDER — METOPROLOL SUCCINATE ER 25 MG PO TB24: 25.0000 mg | ORAL_TABLET | Freq: Every day | ORAL | Status: DC

## 2019-09-13 MED ORDER — PREGABALIN 75 MG PO CAPS
75.0000 mg | ORAL_CAPSULE | Freq: Two times a day (BID) | ORAL | Status: DC
  Administered 2019-09-13 – 2019-09-16 (×6): 75 mg via ORAL
  Filled 2019-09-13 (×6): qty 1

## 2019-09-13 MED ORDER — PIPERACILLIN-TAZOBACTAM 3.375 G IVPB
3.3750 g | Freq: Three times a day (TID) | INTRAVENOUS | Status: DC
  Administered 2019-09-13 – 2019-09-14 (×3): 3.375 g via INTRAVENOUS
  Filled 2019-09-13 (×3): qty 50

## 2019-09-13 MED ORDER — INSULIN ASPART 100 UNIT/ML ~~LOC~~ SOLN
0.0000 [IU] | Freq: Every day | SUBCUTANEOUS | Status: DC
  Administered 2019-09-13: 3 [IU] via SUBCUTANEOUS

## 2019-09-13 MED ORDER — ATORVASTATIN CALCIUM 40 MG PO TABS: 40.0000 mg | ORAL_TABLET | Freq: Every evening | ORAL | Status: DC

## 2019-09-13 MED ORDER — SODIUM CHLORIDE 0.9 % IV SOLN: INTRAVENOUS | Status: DC

## 2019-09-13 MED ORDER — ATORVASTATIN CALCIUM 40 MG PO TABS
40.0000 mg | ORAL_TABLET | Freq: Every day | ORAL | Status: DC
  Administered 2019-09-13 – 2019-09-15 (×3): 40 mg via ORAL
  Filled 2019-09-13 (×3): qty 1

## 2019-09-13 MED ORDER — ACETAMINOPHEN 650 MG RE SUPP: 650.0000 mg | Freq: Four times a day (QID) | RECTAL | Status: DC | PRN

## 2019-09-13 MED ORDER — HYDROMORPHONE HCL 1 MG/ML IJ SOLN
0.5000 mg | INTRAMUSCULAR | Status: DC | PRN
  Administered 2019-09-13 – 2019-09-14 (×2): 1 mg via INTRAVENOUS
  Filled 2019-09-13 (×2): qty 1

## 2019-09-13 MED ORDER — PIPERACILLIN-TAZOBACTAM 3.375 G IVPB 30 MIN
3.3750 g | Freq: Once | INTRAVENOUS | Status: AC
  Administered 2019-09-13: 3.375 g via INTRAVENOUS
  Filled 2019-09-13: qty 50

## 2019-09-13 MED ORDER — ONDANSETRON HCL 4 MG/2ML IJ SOLN: 4.0000 mg | Freq: Four times a day (QID) | INTRAMUSCULAR | Status: DC | PRN

## 2019-09-13 MED ORDER — MAGNESIUM SULFATE 2 GM/50ML IV SOLN
2.0000 g | Freq: Once | INTRAVENOUS | Status: AC
  Administered 2019-09-13: 2 g via INTRAVENOUS
  Filled 2019-09-13: qty 50

## 2019-09-13 MED ORDER — ACETAMINOPHEN 325 MG PO TABS: 650.0000 mg | ORAL_TABLET | Freq: Four times a day (QID) | ORAL | Status: DC | PRN

## 2019-09-13 MED ORDER — FENTANYL CITRATE (PF) 100 MCG/2ML IJ SOLN
50.0000 ug | INTRAMUSCULAR | Status: DC | PRN
  Filled 2019-09-13: qty 2

## 2019-09-13 MED ORDER — VANCOMYCIN HCL 2000 MG/400ML IV SOLN
2000.0000 mg | Freq: Once | INTRAVENOUS | Status: AC
  Administered 2019-09-13: 2000 mg via INTRAVENOUS
  Filled 2019-09-13: qty 400

## 2019-09-13 MED ORDER — INSULIN DETEMIR 100 UNIT/ML ~~LOC~~ SOLN
50.0000 [IU] | Freq: Every day | SUBCUTANEOUS | Status: DC
  Administered 2019-09-13 – 2019-09-15 (×3): 50 [IU] via SUBCUTANEOUS
  Filled 2019-09-13 (×4): qty 0.5

## 2019-09-13 MED ORDER — SODIUM CHLORIDE 0.9% FLUSH: 3.0000 mL | Freq: Once | INTRAVENOUS | Status: DC

## 2019-09-13 MED ORDER — ONDANSETRON HCL 4 MG PO TABS: 4.0000 mg | ORAL_TABLET | Freq: Four times a day (QID) | ORAL | Status: DC | PRN

## 2019-09-13 MED ORDER — ENOXAPARIN SODIUM 40 MG/0.4ML ~~LOC~~ SOLN
40.0000 mg | SUBCUTANEOUS | Status: DC
  Administered 2019-09-13 – 2019-09-16 (×4): 40 mg via SUBCUTANEOUS
  Filled 2019-09-13 (×4): qty 0.4

## 2019-09-13 MED ORDER — VANCOMYCIN HCL IN DEXTROSE 1-5 GM/200ML-% IV SOLN: 1000.0000 mg | Freq: Once | INTRAVENOUS | Status: DC

## 2019-09-13 MED ORDER — VANCOMYCIN VARIABLE DOSE PER UNSTABLE RENAL FUNCTION (PHARMACIST DOSING): Status: DC

## 2019-09-13 MED ORDER — SODIUM CHLORIDE 0.9 % IV BOLUS
500.0000 mL | Freq: Once | INTRAVENOUS | Status: AC
  Administered 2019-09-13: 500 mL via INTRAVENOUS

## 2019-09-13 MED ORDER — OXYCODONE HCL 5 MG PO TABS
5.0000 mg | ORAL_TABLET | ORAL | Status: DC | PRN
  Administered 2019-09-13 – 2019-09-15 (×5): 5 mg via ORAL
  Filled 2019-09-13 (×6): qty 1

## 2019-09-13 NOTE — Progress Notes (Signed)
Subjective:  Patient ID: Christopher Escobar, male    DOB: 02-06-1956,  MRN: [id number]  Chief Complaint  Patient presents with  . Diabetes    pt is here for a diabetic foot ulcer f/u of the right second toe, and the left foot midfoot, pt states that he is concerned that the right second toe is not getting any better since the last time he was here, pt also states that the left foot midfoot is doing a lot better since the last time he was here, pt shows no signs of infection since the last time he was here. Pt also states that he has been keeping both of the ulcers dry as well.    64 y.o. male presents for wound care.  Patient presents with c follow-up of right second toe as well as left midfoot ulceration.  Patient right second toe has progressively gotten worse.  Infection appears to be deeper with epidermal lysis.  The infection is within exposed bone.  Patient said there is some purulent drainage that was expressed.  He has been taking his doxycycline as prescribed.   Review of Systems: Negative except as noted in the HPI. Denies N/V/F/Ch.  Past Medical History:  Diagnosis Date  . Cancer ([place])   . Diabetes mellitus without complication ([place]) 1540  . Hypertension 2008  . Personal history of colonic polyps   . Sleep apnea    not using cpap    Current Outpatient Medications:  .  atorvastatin (LIPITOR) 40 MG tablet, Take 40 mg by mouth daily at 6 PM. , Disp: , Rfl:  .  doxycycline (VIBRA-TABS) 100 MG tablet, Take 1 tablet (100 mg total) by mouth 2 (two) times daily., Disp: 20 tablet, Rfl: 0 .  glimepiride (AMARYL) 4 MG tablet, Take 2 tablets (8 mg total) by mouth 2 (two) times daily., Disp: 60 tablet, Rfl: 3 .  insulin aspart (NOVOLOG) cartridge, Inject into the skin., Disp: , Rfl:  .  insulin detemir (LEVEMIR) 100 UNIT/ML injection, Inject 40 Units into the skin at bedtime. , Disp: , Rfl:  .  Insulin Glargine (BASAGLAR KWIKPEN) 100 UNIT/ML SOPN, Inject 18-20 Units into the skin See admin  instructions. 18 units in the morning and 20 units in the evening, Disp: , Rfl:  .  lisinopril-hydrochlorothiazide (PRINZIDE,ZESTORETIC) 20-12.5 MG tablet, Take 1 tablet every morning by mouth. , Disp: , Rfl:  .  metoprolol succinate (TOPROL-XL) 25 MG 24 hr tablet, Take 25 mg every morning by mouth. , Disp: , Rfl:  .  Multiple Vitamin (ONE-A-DAY MENS PO), Take 1 tablet by mouth daily., Disp: , Rfl:  .  NOVOLOG MIX 70/30 FLEXPEN (70-30) 100 UNIT/ML FlexPen, , Disp: , Rfl:  .  oxyCODONE-acetaminophen (PERCOCET) 7.5-325 MG tablet, Take 1 tablet by mouth 5 (five) times daily., Disp: , Rfl:  .  pregabalin (LYRICA) 75 MG capsule, Take by mouth., Disp: , Rfl:   Social History   Tobacco Use  Smoking Status Former Smoker  . Packs/day: 1.00  . Years: 30.00  . Pack years: 30.00  . Types: Cigarettes  . Quit date: 02/03/2007  . Years since quitting: 12.6  Smokeless Tobacco Never Used    No Known Allergies Objective:  There were no vitals filed for this visit. There is no height or weight on file to calculate BMI. Constitutional Well developed. Well nourished.  Vascular Dorsalis pedis pulses faintly palpable bilaterally. Posterior tibial pulses faintly palpable bilaterally. Capillary refill normal to all digits.  No cyanosis or clubbing  noted. Pedal hair growth normal.  Neurologic Normal speech. Oriented to person, place, and time. Protective sensation absent  Dermatologic Wound Location: Right second distal tip ulceration with worsening with probing down to bone with exposed bone.  Purulent drainage was expressed.  Erythema up to the MPJ.  . Left plantar midfoot wound probes down to muscle layer but does not probe down to bone.  No signs of infection.  No purulent drainage noted.  No malodor present. Wound Base: Mixed Granular/Fibrotic Peri-wound: Calloused Exudate: Scant/small amount Serosanguinous exudate Wound Measurements: -See below  Orthopedic: No pain to palpation either foot.    Radiographs: 3 views of skeletally mature adult foot bilateral: No signs of osteomyelitis noted.  No cortical irregularity or destruction of the second toe on the right or the plantar midfoot wound.  No soft tissue emphysema noted.  No foreign body noted. Assessment:   1. Diabetic ulcer of left midfoot associated with diabetes mellitus due to underlying condition, with fat layer exposed ([place])   2. Right second toe ulcer, with fat layer exposed ([place])   3. Type 2 diabetes mellitus without complication, with long-term current use of insulin ([place])   4. Acute hematogenous osteomyelitis of right foot ([place])    Plan:  Patient was evaluated and treated and all questions answered.  Ulcer right second distal tip with fat layer exposed and left lateral plantar midfoot with fat layer exposed -Debridement as below. -Dressed with  Betadine wet-to-dry dressing DSD. -Continue off-loading with bilateral surgical shoe. -Doxycycline was dispensed for skin and soft tissue prophylaxis -He will need to take time off from work to take the pressure away from the ulceration.  If he is unable to do so there is a chance that he may get worsening infection of the foot and may end up losing part of his foot. I discussed with the patient that the second toe infection on the right side has worsened progressively.  There is some redness associated with it as well.  However the wound itself has not exposed bone as well as the wound is probing down to bone with purulent drainage that was expressed in clinic.  Patient will need an amputation of the right second digit at the level of the metatarsophalangeal joint. -He was sent to the emergency room to be admitted for IV antibiotics and surgery/amputation by me on Thursday. -Continue local wound care with Betadine wet-to-dry dressing changes for now.   Procedure: Excisional Debridement of Wound right second exposed bone Tool: Sharp chisel blade/tissue nipper Rationale:  Removal of non-viable soft tissue from the wound to promote healing.  Anesthesia: none Pre-Debridement Wound Measurements: 0.5 cm x 0.5 cm x 0.3 cm  Post-Debridement Wound Measurements: 0.6 cm x 0.6 cm x 0.3 cm  Type of Debridement: Sharp Excisional Tissue Removed: Non-viable soft tissue Blood loss: Minimal (<50cc) Depth of Debridement: subcutaneous tissue. Technique: Sharp excisional debridement to bleeding, viable wound base.  Wound Progress: This is initial encounter.  I will continue to monitor the progression of the wound. Site healing conversation 7 Dressing: Dry, sterile, compression dressing. Disposition: Patient tolerated procedure well. Patient to return in 1 week for follow-up.  Procedure: Excisional Debridement of Wound left lateral plantar midfoot wound~improving Tool: Sharp chisel blade/tissue nipper Rationale: Removal of non-viable soft tissue from the wound to promote healing.  Anesthesia: none Pre-Debridement Wound Measurements: 1.5 cm x 1.5 cm x 0.5 cm Post-Debridement Wound Measurements: 1.6 cm x 1.6 cm x 0.5 cm Type of Debridement: Sharp Excisional Tissue Removed:  Non-viable soft tissue Blood loss: Minimal (<50cc) Depth of Debridement: subcutaneous tissue. Technique: Sharp excisional debridement to bleeding, viable wound base.  Wound Progress: This is initial encounter.  The wound sizes decreasing in the depth slowly improving. Site healing conversation 7 Dressing: Dry, sterile, compression dressing. Disposition: Patient tolerated procedure well. Patient to return in 1 week for follow-up.  No follow-ups on file.

## 2019-09-13 NOTE — ED Provider Notes (Signed)
[facility] EMERGENCY DEPARTMENT Provider Note   CSN: [id number] Arrival date & time: 09/13/19  1025     History Chief Complaint  Patient presents with  . Wound Infection    2nd to big toe    Christopher Escobar is a 64 y.o. male with a history of diabetes mellitus, hypertension, & sleep apnea who presents to the ED from his podiatrist's office for admission for IV abx & surgical intervention for R 2nd toe infection. Patient states he has had problems with a wound to his right second toe over the past 3 to 4 weeks.  He has been on trials of outpatient antibiotics without much improvement.  He was seen by his podiatrist Dr. Posey Pronto in clinic today, had some debridement which he states caused some discomfort, and was sent to the emergency department for admission and IV antibiotics, plan for surgery 09/15/2019.  No alleviating or rating factors to his symptoms.  He denies fever, chills, nausea, vomiting, chest pain, dyspnea, abdominal pain, numbness, or weakness.  HPI     Past Medical History:  Diagnosis Date  . Cancer ([place])   . Diabetes mellitus without complication ([place]) 7494  . Hypertension 2008  . Personal history of colonic polyps   . Sleep apnea    not using cpap    Patient Active Problem List   Diagnosis Date Noted  . Class 2 obesity with body mass index (BMI) of 37.0 to 37.9 in adult 09/06/2019  . Essential hypertension 06/03/2017  . Degenerative joint disease of shoulder, left 12/03/2016  . Incomplete tear of left rotator cuff 12/03/2016  . Rotator cuff tendinitis, left 12/03/2016  . Diabetes mellitus type 2, uncomplicated ([place]) 49/67/5916  . History of arthroscopy of left shoulder 11/03/2016  . History of colonic polyps 10/08/2016    Past Surgical History:  Procedure Laterality Date  . BACK SURGERY  1989  . COLONOSCOPY  2013  . COLONOSCOPY WITH PROPOFOL N/A 10/28/2016   Procedure: COLONOSCOPY WITH PROPOFOL;  Surgeon: Robert Bellow, MD;   Location: [facility] ENDOSCOPY;  Service: Endoscopy;  Laterality: N/A;  . SHOULDER ARTHROSCOPY WITH SUBACROMIAL DECOMPRESSION Left 04/02/2017   Procedure: SHOULDER ARTHROSCOPY WITH DECOMPRESSION, DEBRIDEMENT, SLAP REPAIR,;  Surgeon: Corky Mull, MD;  Location: [facility] ORS;  Service: Orthopedics;  Laterality: Left;  . SHOULDER SURGERY Bilateral 2011,2012       No family history on file.  Social History   Tobacco Use  . Smoking status: Former Smoker    Packs/day: 1.00    Years: 30.00    Pack years: 30.00    Types: Cigarettes    Quit date: 02/03/2007    Years since quitting: 12.6  . Smokeless tobacco: Never Used  Vaping Use  . Vaping Use: Never used  Substance Use Topics  . Alcohol use: Yes    Comment: seldom  . Drug use: No    Home Medications Prior to Admission medications   Medication Sig Start Date End Date Taking? Authorizing Provider  atorvastatin (LIPITOR) 40 MG tablet Take 40 mg by mouth daily at 6 PM.     [provider]  doxycycline (VIBRA-TABS) 100 MG tablet Take 1 tablet (100 mg total) by mouth 2 (two) times daily. 09/06/19   Felipa Furnace, DPM  glimepiride (AMARYL) 4 MG tablet Take 2 tablets (8 mg total) by mouth 2 (two) times daily. 04/02/17   Poggi, Marshall Cork, MD  insulin aspart (NOVOLOG) cartridge Inject into the skin.    [provider]  insulin detemir (LEVEMIR) 100 UNIT/ML injection Inject 40 Units into the skin at bedtime.     [provider]  Insulin Glargine (BASAGLAR KWIKPEN) 100 UNIT/ML SOPN Inject 18-20 Units into the skin See admin instructions. 18 units in the morning and 20 units in the evening    [provider]  lisinopril-hydrochlorothiazide (PRINZIDE,ZESTORETIC) 20-12.5 MG tablet Take 1 tablet every morning by mouth.     [provider]  metoprolol succinate (TOPROL-XL) 25 MG 24 hr tablet Take 25 mg every morning by mouth.     [provider]  Multiple Vitamin (ONE-A-DAY MENS PO) Take 1 tablet by mouth daily.     [provider]  NOVOLOG MIX 70/30 FLEXPEN (70-30) 100 UNIT/ML FlexPen  08/23/19   [provider]  oxyCODONE-acetaminophen (PERCOCET) 7.5-325 MG tablet Take 1 tablet by mouth 5 (five) times daily. 08/28/19   [provider]  pregabalin (LYRICA) 75 MG capsule Take by mouth. 05/18/17   [provider]    Allergies    Patient has no known allergies.  Review of Systems   Review of Systems  Constitutional: Negative for chills and fever.  Respiratory: Negative for shortness of breath.   Cardiovascular: Negative for chest pain.  Gastrointestinal: Negative for abdominal pain, nausea and vomiting.  Musculoskeletal: Positive for arthralgias.  Skin: Positive for wound.  Neurological: Negative for syncope.  All other systems reviewed and are negative.   Physical Exam Updated Vital Signs BP 101/65 (BP Location: Right Arm)   Pulse (!) 106   Temp 97.9 F (36.6 C) (Oral)   Resp 18   SpO2 99%   Physical Exam Vitals and nursing note reviewed.  Constitutional:      General: He is not in acute distress.    Appearance: He is well-developed. He is not ill-appearing or toxic-appearing.  HENT:     Head: Normocephalic and atraumatic.  Eyes:     General:        Right eye: No discharge.        Left eye: No discharge.     Conjunctiva/sclera: Conjunctivae normal.  Cardiovascular:     Rate and Rhythm: Normal rate and regular rhythm.     Pulses:          Dorsalis pedis pulses are 2+ on the right side and 2+ on the left side.       Posterior tibial pulses are 2+ on the right side and 2+ on the left side.  Pulmonary:     Effort: Pulmonary effort is normal. No respiratory distress.     Breath sounds: Normal breath sounds. No wheezing, rhonchi or rales.  Abdominal:     General: There is no distension.     Palpations: Abdomen is soft.     Tenderness: There is no abdominal tenderness.  Musculoskeletal:     Cervical back: Neck supple.     Comments: Lower  extremities: Right lower extremity with dressing in place.  Moved to side mildly to be able to visualize distal aspect of right second toe which is ulcerated with erythema.  Mild tenderness to palpation.  Skin:    General: Skin is warm and dry.     Capillary Refill: Capillary refill takes less than 2 seconds.     Findings: No rash.  Neurological:     Mental Status: He is alert.     Comments: Alert. Clear speech. Sensation grossly intact to bilateral lower extremities. 5/5 strength with plantar/dorsiflexion bilaterally.   Psychiatric:  Mood and Affect: Mood normal.        Behavior: Behavior normal.     ED Results / Procedures / Treatments   Labs (all labs ordered are listed, but only abnormal results are displayed) Labs Reviewed  LACTIC ACID, PLASMA - Abnormal; Notable for the following components:      Result Value   Lactic Acid, Venous 2.3 (*)    All other components within normal limits  COMPREHENSIVE METABOLIC PANEL - Abnormal; Notable for the following components:   Sodium 133 (*)    CO2 20 (*)    Glucose, Bld 431 (*)    BUN 24 (*)    Creatinine, Ser 1.27 (*)    Albumin 2.9 (*)    AST 11 (*)    GFR calc non Af Amer 58 (*)    All other components within normal limits  CBC WITH DIFFERENTIAL/PLATELET - Abnormal; Notable for the following components:   WBC 14.6 (*)    Neutro Abs 9.5 (*)    Monocytes Absolute 1.4 (*)    Abs Immature Granulocytes 0.09 (*)    All other components within normal limits  LACTIC ACID, PLASMA  URINALYSIS, ROUTINE W REFLEX MICROSCOPIC    EKG None  Radiology DG Chest 2 View  Result Date: 09/13/2019 CLINICAL DATA:  Toe infection. EXAM: CHEST - 2 VIEW COMPARISON:  06/16/2019. FINDINGS: Mediastinum and hilar structures normal. Prominent epicardial fat pads again. Heart size normal. Low lung volumes mild basilar atelectasis. Mild bibasilar atelectasis. No pleural effusion or pneumothorax. Degenerative change thoracic spine. IMPRESSION: Low  lung volumes.  With mild basilar atelectasis. Electronically Signed   By: Marcello Moores  Register   On: 09/13/2019 11:19    Procedures Procedures (including critical care time)  Medications Ordered in ED Medications  sodium chloride flush (NS) 0.9 % injection 3 mL (has no administration in time range)    ED Course  I have reviewed the triage vital signs and the nursing notes.  Pertinent labs & imaging results that were available during my care of the patient were reviewed by me and considered in my medical decision making (see chart for details).    MERLON ALCORTA was evaluated in Emergency Department on 09/13/2019 for the symptoms described in the history of present illness. He/she was evaluated in the context of the global COVID-19 pandemic, which necessitated consideration that the patient might be at risk for infection with the SARS-CoV-2 virus that causes COVID-19. Institutional protocols and algorithms that pertain to the evaluation of patients at risk for COVID-19 are in a state of rapid change based on information released by regulatory bodies including the CDC and federal and state organizations. These policies and algorithms were followed during the patient's care in the ED.  MDM Rules/Calculators/A&P                         Patient presents to the ED from his podiatrist office for IV abx and admission.   Additional history obtained:  Additional history obtained from chart & nursing note review.  Lab Tests:  I Ordered, reviewed, and interpreted labs, which included:  CBC: Leukocytosis @ 14.6 with left shift. No anemia.  CMP: Hyperglycemia with mildly low bicarb, no anion gap elevation.  Creatinine increased from prior. Lactic acid: mildly elevated.  Imaging Studies ordered:  I ordered imaging studies which included right foot x-ray- this is pending currently, chest x-ray was obtained per triage with low lung volumes and mild atelectasis noted- I  have personally reviewed &  interpreted imaging.   ED Course:  We will start IV vancomycin and Zosyn.  Consult placed to hospitalist service. Dr. Roslynn Amble has discussed with hospitalist Dr. Doristine Bosworth- accepts admission.   Portions of this note were generated with [employer]. Dictation errors may occur despite best attempts at proofreading.  Final Clinical Impression(s) / ED Diagnoses Final diagnoses:  Diabetic foot infection ([place])  Hyperglycemia    Rx / DC Orders ED Discharge Orders    None       Amaryllis Dyke, PA-C 09/13/19 1348    Lucrezia Starch, MD 09/13/19 1440

## 2019-09-13 NOTE — ED Notes (Signed)
  Lactic Acid 2.3

## 2019-09-13 NOTE — Progress Notes (Signed)
Pharmacy Antibiotic Note  Christopher Escobar is a 64 y.o. male admitted on 09/13/2019 with cellulitis.  Pharmacy has been consulted for vancomycin and Zosyn dosing.  Patient presenting from podiatrist with R toe infection with purulent drainage and exposed bone, no response to 7 days of outpatient doxycycline. Amputation planned for 6/17. Patient is afebrile. WBC count elevated at 14.6. Creatinine appears elevated at 1.27, no clear baseline. Imaging from 6/8 showed no OM.  Plan: Vancomycin 2000 mg IV every x1 for loading dose given exposed bone. Goal trough 15-20 mcg/mL. Zosyn 3.375g IV q8h (4 hour infusion). Will monitor renal function for subsequent doses Monitor clinical status, LOT, de-escalation  Temp (24hrs), Avg:97.8 F (36.6 C), Min:97.7 F (36.5 C), Max:97.9 F (36.6 C)  Recent Labs  Lab 09/13/19 1057  WBC 14.6*  CREATININE 1.27*  LATICACIDVEN 2.3*    Estimated Creatinine Clearance: 76 mL/min (A) (by C-G formula based on SCr of 1.27 mg/dL (H)).    No Known Allergies  Antimicrobials this admission: zosyn 6/15 >>  vancomycin 6/15 >>   Dose adjustments this admission: N/A  Microbiology results: 6/15 BCx: sent   Thank you for allowing pharmacy to be a part of this patient's care.  Eddie Candle, PharmD PGY-1 Pharmacy Resident   Please check amion for clinical pharmacist contact number 09/13/2019 1:54 PM

## 2019-09-13 NOTE — ED Triage Notes (Signed)
Pt. Stated, Im suppose to be admitted to remove a toe. Right, beside of big toe.

## 2019-09-13 NOTE — H&P (Addendum)
History and Physical    ROLLY MAGRI JJO:[id number] DOB: 1955/06/08 DOA: 09/13/2019  PCP: Albina Billet, MD  Patient coming from: Podiatry office  I have personally briefly reviewed patient's old medical records in [place]  Chief Complaint: Right foot infection  HPI: Christopher Escobar is a 64 y.o. male with medical history significant of hypertension, hyperlipidemia, diabetes mellitus, morbid obesity, sleep apnea (noncompliant with CPAP), tubular adenoma presents to emergency department due to right foot infection.  Patient tells me that he was seen by his podiatrist this morning and due to worsening right second toe infection he was advised to go to ER for IV antibiotics.  Reports that he has wound in right second toe since 3 to 4 weeks and has been getting worse associated with purulent drainage.  He also has left midfoot infection which is getting better.  He was prescribed doxycycline with no improvement.  He reports mild pain of 4 out of 10 however denies association with fever, chills, nausea, vomiting, decreased appetite, weakness, lethargy.  No history of headache, blurry vision, chest pain, shortness of breath, palpitation, leg swelling, urinary or bowel changes.  He lives with his wife at home.  No history of smoking, alcohol, illicit drug use.  ED Course: Upon arrival to ED patient's vital signs stable, afebrile with leukocytosis of 14.6, lactic acid: 2.3, CMP shows sodium of 133, BUN: 24, creatinine: 1.27, GFR: 58, COVID-19 pending, chest x-ray shows low lung volume and mild bibasilar atelectasis.  X-ray of right foot is pending.  Patient was given IV Zosyn and vancomycin in ED.  [facility] hospitalist consulted for admission for right foot infection.  Review of Systems: As per HPI otherwise negative.    Past Medical History:  Diagnosis Date  . Cancer ([place])   . Diabetes mellitus without complication ([place]) 1601  . Hypertension 2008  . Personal history of colonic polyps    . Sleep apnea    not using cpap    Past Surgical History:  Procedure Laterality Date  . BACK SURGERY  1989  . COLONOSCOPY  2013  . COLONOSCOPY WITH PROPOFOL N/A 10/28/2016   Procedure: COLONOSCOPY WITH PROPOFOL;  Surgeon: Robert Bellow, MD;  Location: [facility] ENDOSCOPY;  Service: Endoscopy;  Laterality: N/A;  . SHOULDER ARTHROSCOPY WITH SUBACROMIAL DECOMPRESSION Left 04/02/2017   Procedure: SHOULDER ARTHROSCOPY WITH DECOMPRESSION, DEBRIDEMENT, SLAP REPAIR,;  Surgeon: Corky Mull, MD;  Location: [facility] ORS;  Service: Orthopedics;  Laterality: Left;  . SHOULDER SURGERY Bilateral 2011,2012     reports that he quit smoking about 12 years ago. His smoking use included cigarettes. He has a 30.00 pack-year smoking history. He has never used smokeless tobacco. He reports current alcohol use. He reports that he does not use drugs.  No Known Allergies  No family history on file.  Prior to Admission medications   Medication Sig Start Date End Date Taking? Authorizing Provider  atorvastatin (LIPITOR) 40 MG tablet Take 40 mg by mouth daily at 6 PM.     [provider]  doxycycline (VIBRA-TABS) 100 MG tablet Take 1 tablet (100 mg total) by mouth 2 (two) times daily. 09/06/19   Felipa Furnace, DPM  glimepiride (AMARYL) 4 MG tablet Take 2 tablets (8 mg total) by mouth 2 (two) times daily. 04/02/17   Poggi, Marshall Cork, MD  insulin aspart (NOVOLOG) cartridge Inject into the skin.    [provider]  insulin detemir (LEVEMIR) 100 UNIT/ML injection Inject 40 Units into the skin at  bedtime.     [provider]  Insulin Glargine (BASAGLAR KWIKPEN) 100 UNIT/ML SOPN Inject 18-20 Units into the skin See admin instructions. 18 units in the morning and 20 units in the evening    [provider]  lisinopril-hydrochlorothiazide (PRINZIDE,ZESTORETIC) 20-12.5 MG tablet Take 1 tablet every morning by mouth.     [provider]  metoprolol succinate (TOPROL-XL) 25 MG 24 hr tablet  Take 25 mg every morning by mouth.     [provider]  Multiple Vitamin (ONE-A-DAY MENS PO) Take 1 tablet by mouth daily.    [provider]  NOVOLOG MIX 70/30 FLEXPEN (70-30) 100 UNIT/ML FlexPen  08/23/19   [provider]  oxyCODONE-acetaminophen (PERCOCET) 7.5-325 MG tablet Take 1 tablet by mouth 5 (five) times daily. 08/28/19   [provider]  pregabalin (LYRICA) 75 MG capsule Take by mouth. 05/18/17   [provider]    Physical Exam: Vitals:   09/13/19 1039 09/13/19 1233  BP: 101/65 (!) 106/59  Pulse: (!) 106 75  Resp: 18 16  Temp: 97.9 F (36.6 C) 97.7 F (36.5 C)  TempSrc: Oral Oral  SpO2: 99% 97%    Constitutional: NAD, calm, comfortable, communicating well, obese Eyes: PERRL, lids and conjunctivae normal ENMT: Mucous membranes are moist. Posterior pharynx clear of any exudate or lesions.Normal dentition.  Neck: normal, supple, no masses, no thyromegaly Respiratory: clear to auscultation bilaterally, no wheezing, no crackles. Normal respiratory effort. No accessory muscle use.  Cardiovascular: Regular rate and rhythm, no murmurs / rubs / gallops. No extremity edema. 2+ pedal pulses. No carotid bruits.  Abdomen: no tenderness, no masses palpated. No hepatosplenomegaly. Bowel sounds positive.  Musculoskeletal: Right and left foot: Dressing in place.  No active bleeding or discharge seen.  No restrictive range of motion noted in flexion or extension of ankle. Neurologic: CN 2-12 grossly intact. Sensation intact, DTR normal. Strength 5/5 in all 4.  Psychiatric: Normal judgment and insight. Alert and oriented x 3. Normal mood.    Labs on Admission: I have personally reviewed following labs and imaging studies  CBC: Recent Labs  Lab 09/13/19 1057  WBC 14.6*  NEUTROABS 9.5*  HGB 13.6  HCT 42.2  MCV 90.8  PLT 268   Basic Metabolic Panel: Recent Labs  Lab 09/13/19 1057  NA 133*  K 4.1  CL 99  CO2 20*  GLUCOSE 431*  BUN  24*  CREATININE 1.27*  CALCIUM 9.3   GFR: CrCl cannot be calculated (Unknown ideal weight.). Liver Function Tests: Recent Labs  Lab 09/13/19 1057  AST 11*  ALT 11  ALKPHOS 69  BILITOT 0.8  PROT 7.3  ALBUMIN 2.9*   No results for input(s): LIPASE, AMYLASE in the last 168 hours. No results for input(s): AMMONIA in the last 168 hours. Coagulation Profile: No results for input(s): INR, PROTIME in the last 168 hours. Cardiac Enzymes: No results for input(s): CKTOTAL, CKMB, CKMBINDEX, TROPONINI in the last 168 hours. BNP (last 3 results) No results for input(s): PROBNP in the last 8760 hours. HbA1C: No results for input(s): HGBA1C in the last 72 hours. CBG: No results for input(s): GLUCAP in the last 168 hours. Lipid Profile: No results for input(s): CHOL, HDL, LDLCALC, TRIG, CHOLHDL, LDLDIRECT in the last 72 hours. Thyroid Function Tests: No results for input(s): TSH, T4TOTAL, FREET4, T3FREE, THYROIDAB in the last 72 hours. Anemia Panel: No results for input(s): VITAMINB12, FOLATE, FERRITIN, TIBC, IRON, RETICCTPCT in the last 72 hours. Urine analysis:  Component Value Date/Time   COLORURINE YELLOW 07/19/2014 1659   APPEARANCEUR CLEAR 07/19/2014 1659   LABSPEC 1.015 07/19/2014 1659   PHURINE 5.5 07/19/2014 1659   GLUCOSEU 500 mg/dL 07/19/2014 1659   HGBUR NEGATIVE 07/19/2014 1659   BILIRUBINUR NEGATIVE 07/19/2014 1659   KETONESUR NEGATIVE 07/19/2014 1659   PROTEINUR NEGATIVE 07/19/2014 1659   NITRITE NEGATIVE 07/19/2014 1659   LEUKOCYTESUR NEGATIVE 07/19/2014 1659    Radiological Exams on Admission: DG Chest 2 View  Result Date: 09/13/2019 CLINICAL DATA:  Toe infection. EXAM: CHEST - 2 VIEW COMPARISON:  06/16/2019. FINDINGS: Mediastinum and hilar structures normal. Prominent epicardial fat pads again. Heart size normal. Low lung volumes mild basilar atelectasis. Mild bibasilar atelectasis. No pleural effusion or pneumothorax. Degenerative change thoracic spine.  IMPRESSION: Low lung volumes.  With mild basilar atelectasis. Electronically Signed   By: McClure   On: 09/13/2019 11:19    Assessment/Plan Principal Problem:   Right foot infection Active Problems:   Diabetes mellitus type 2, uncomplicated ([place])   Essential hypertension   HLD (hyperlipidemia)   AKI (acute kidney injury) ([place])   Hyponatremia    Diabetic foot ulcer: In right second toe -Patient presented with pain, worsening of ulcer and right second toe associated with purulent discharge.  He is afebrile with leukocytosis of 14.6, lactic acid elevated at 1.3.  COVID-19 pending.  Reviewed chest x-ray. -Patient was given IV Zosyn and vancomycin in ED.  X-ray of right foot is pending -Admit patient on the floor. -We will get blood culture, trend lactic acid. -Start on IV fluids.  Continue IV Vanco and Zosyn. -Plan is for amputation of right second toe at the level of metatarsophalangeal joint on Thursday by Dr. Posey Pronto -Tylenol/oxycodone/Dilaudid as needed for mild/moderate/severe pain respectively. -Zofran as needed for pain control.  Monitor vitals closely.  AKI: -BUN: 24, creatinine: 1.27, GFR: 58 (baseline creatinine: 0.83, GFR: More than 60) -Start on maintenance IV fluid.  Avoid nephrotoxic medication.  Repeat BMP tomorrow a.m.  Hyponatremia: Sodium of 133 -Continue IV fluids.  Repeat BMP tomorrow a.m.  Hypertension: Stable -Continue metoprolol, hold lisinopril-HCTZ due to AKI. -Hydralazine as needed -Monitor blood pressure closely  Diabetes mellitus: Uncontrolled. A1c today is 10.9% -Continue Levemir 50 units at bedtime. Started on sliding scale insulin and monitor blood sugar closely.    Hypomagnesemia: Replenished -Check magnesium level tomorrow AM.  Diabetic neuropathy: Continue Lyrica  Hyperlipidemia: Continue statin  Tubular adenoma: Had colonoscopy in 2018 -Reviewed biopsy results from 2018 which shows-tubular adenoma in ascending colon-negative for  high-grade dysplasia and malignancy. -No history of melena. -Follow-up with GI outpatient.  History of obstructive sleep apnea-noncompliant with CPAP  DVT prophylaxis: Lovenox/SCD Code Status: Full code Family Communication: None present at bedside.  Plan of care discussed with patient in length and he verbalized understanding and agreed with it. Disposition Plan: Likely home in 4 days Consults called: None Admission status: Inpatient   Mckinley Jewel MD [facility]  If 7PM-7AM, please contact night-coverage [email or web address] Password [facility]  09/13/2019, 1:13 PM

## 2019-09-13 NOTE — ED Notes (Signed)
As noted earlier pt refused fentanyl. Unable to return to pyxis under pt name. Returned to pyxis with pharmd, Seth Bake.

## 2019-09-13 NOTE — ED Notes (Signed)
Pt has been asked to change into gown, but has not do so.

## 2019-09-14 ENCOUNTER — Encounter (HOSPITAL_COMMUNITY): Payer: Self-pay | Admitting: Podiatry

## 2019-09-14 DIAGNOSIS — Z794 Long term (current) use of insulin: Secondary | ICD-10-CM

## 2019-09-14 DIAGNOSIS — I10 Essential (primary) hypertension: Secondary | ICD-10-CM

## 2019-09-14 DIAGNOSIS — E11628 Type 2 diabetes mellitus with other skin complications: Secondary | ICD-10-CM

## 2019-09-14 DIAGNOSIS — M86671 Other chronic osteomyelitis, right ankle and foot: Secondary | ICD-10-CM

## 2019-09-14 DIAGNOSIS — E119 Type 2 diabetes mellitus without complications: Secondary | ICD-10-CM

## 2019-09-14 LAB — COMPREHENSIVE METABOLIC PANEL
ALT: 10 U/L (ref 0–44)
AST: 12 U/L — ABNORMAL LOW (ref 15–41)
Albumin: 2.6 g/dL — ABNORMAL LOW (ref 3.5–5.0)
Alkaline Phosphatase: 60 U/L (ref 38–126)
Anion gap: 10 (ref 5–15)
BUN: 22 mg/dL (ref 8–23)
CO2: 22 mmol/L (ref 22–32)
Calcium: 8.5 mg/dL — ABNORMAL LOW (ref 8.9–10.3)
Chloride: 100 mmol/L (ref 98–111)
Creatinine, Ser: 1.13 mg/dL (ref 0.61–1.24)
GFR calc Af Amer: >60 mL/min (ref >60)
GFR calc non Af Amer: >60 mL/min (ref >60)
Glucose, Bld: 216 mg/dL — ABNORMAL HIGH (ref 70–99)
Potassium: 3.8 mmol/L (ref 3.5–5.1)
Sodium: 132 mmol/L — ABNORMAL LOW (ref 135–145)
Total Bilirubin: 1.2 mg/dL (ref 0.3–1.2)
Total Protein: 6.8 g/dL (ref 6.5–8.1)

## 2019-09-14 LAB — MAGNESIUM: Magnesium: 1.5 mg/dL — ABNORMAL LOW (ref 1.7–2.4)

## 2019-09-14 LAB — CBC
HCT: 38.3 % — ABNORMAL LOW (ref 39.0–52.0)
Hemoglobin: 12.7 g/dL — ABNORMAL LOW (ref 13.0–17.0)
MCH: 29.6 pg (ref 26.0–34.0)
MCHC: 33.2 g/dL (ref 30.0–36.0)
MCV: 89.3 fL (ref 80.0–100.0)
Platelets: 251 10*3/uL (ref 150–400)
RBC: 4.29 MIL/uL (ref 4.22–5.81)
RDW: 13.2 % (ref 11.5–15.5)
WBC: 13.8 10*3/uL — ABNORMAL HIGH (ref 4.0–10.5)
nRBC: 0 % (ref 0.0–0.2)

## 2019-09-14 LAB — GLUCOSE, CAPILLARY
Glucose-Capillary: 180 mg/dL — ABNORMAL HIGH (ref 70–99)
Glucose-Capillary: 224 mg/dL — ABNORMAL HIGH (ref 70–99)
Glucose-Capillary: 273 mg/dL — ABNORMAL HIGH (ref 70–99)
Glucose-Capillary: 182 mg/dL — ABNORMAL HIGH (ref 70–99)

## 2019-09-14 LAB — SURGICAL PCR SCREEN
MRSA, PCR: NEGATIVE
Staphylococcus aureus: NEGATIVE

## 2019-09-14 MED ORDER — MAGNESIUM SULFATE 4 GM/100ML IV SOLN
4.0000 g | Freq: Once | INTRAVENOUS | Status: AC
  Administered 2019-09-14: 4 g via INTRAVENOUS
  Filled 2019-09-14: qty 100

## 2019-09-14 MED ORDER — SODIUM CHLORIDE 0.9 % IV SOLN
2.0000 g | Freq: Three times a day (TID) | INTRAVENOUS | Status: DC
  Administered 2019-09-14 – 2019-09-15 (×2): 2 g via INTRAVENOUS
  Filled 2019-09-14 (×2): qty 2

## 2019-09-14 MED ORDER — INSULIN ASPART 100 UNIT/ML ~~LOC~~ SOLN
4.0000 [IU] | Freq: Three times a day (TID) | SUBCUTANEOUS | Status: DC
  Administered 2019-09-14 – 2019-09-16 (×5): 4 [IU] via SUBCUTANEOUS

## 2019-09-14 MED ORDER — VANCOMYCIN HCL IN DEXTROSE 1-5 GM/200ML-% IV SOLN
1000.0000 mg | Freq: Two times a day (BID) | INTRAVENOUS | Status: DC
  Administered 2019-09-14 (×2): 1000 mg via INTRAVENOUS
  Filled 2019-09-14 (×3): qty 200

## 2019-09-14 NOTE — Plan of Care (Signed)
  Problem: Pain Managment: Goal: General experience of comfort will improve Outcome: Progressing   Problem: Safety: Goal: Ability to remain free from injury will improve Outcome: Progressing   Problem: Skin Integrity: Goal: Risk for impaired skin integrity will decrease Outcome: Progressing   

## 2019-09-14 NOTE — Progress Notes (Signed)
PROGRESS NOTE    Christopher Escobar  NLZ:[id number] DOB: 20-Feb-1956 DOA: 09/13/2019 PCP: Albina Billet, MD    Brief Narrative:  64 y.o. male with medical history significant of hypertension, hyperlipidemia, diabetes mellitus, morbid obesity, sleep apnea (noncompliant with CPAP), tubular adenoma presents to emergency department due to right foot infection.  Patient tells me that he was seen by his podiatrist this morning and due to worsening right second toe infection he was advised to go to ER for IV antibiotics.  Reports that he has wound in right second toe since 3 to 4 weeks and has been getting worse associated with purulent drainage.  He also has left midfoot infection which is getting better.  He was prescribed doxycycline with no improvement.  He reports mild pain of 4 out of 10 however denies association with fever, chills, nausea, vomiting, decreased appetite, weakness, lethargy.  No history of headache, blurry vision, chest pain, shortness of breath, palpitation, leg swelling, urinary or bowel changes.  He lives with his wife at home.  No history of smoking, alcohol, illicit drug use.  ED Course: Upon arrival to ED patient's vital signs stable, afebrile with leukocytosis of 14.6, lactic acid: 2.3, CMP shows sodium of 133, BUN: 24, creatinine: 1.27, GFR: 58, COVID-19 pending, chest x-ray shows low lung volume and mild bibasilar atelectasis.  X-ray of right foot is pending.  Patient was given IV Zosyn and vancomycin in ED.  [facility] hospital[facility] consulted for admission for right foot infection.  Assessment & Plan:   Principal Problem:   Right foot infection Active Problems:   Diabetes mellitus type 2, uncomplicated (HCC)   Essential hypertension   HLD (hyperlipidemia)   AKI (acute kidney injury) ([place])   Hyponatremia  Diabetic foot ulcer: In right second toe -Patient presented with pain, worsening of ulcer and right second toe associated with purulent discharge.  He is afebrile  with leukocytosis of 14.6, lactic acid elevated at 1.3.  COVID-19 pending.  Reviewed chest x-ray. -Patient was given IV Zosyn and vancomycin in ED. Continue broad spec abx per Podiatry recs -Blood cx neg thus far -Will d/c zosyn and change to cefepime to decrease risk of ATN -Plan is for amputation of right second toe at the level of metatarsophalangeal joint on Thursday by Dr. Posey Pronto -Tylenol/oxycodone/Dilaudid as needed for mild/moderate/severe pain respectively. -Zofran as needed for pain control.  Monitor vitals closely.  AKI: -BUN: 24, creatinine: 1.27, GFR: 58 (baseline creatinine: 0.83, GFR: More than 60) -Start on maintenance IV fluid.  Avoid nephrotoxic medication.  -Repeat bmet in AM  Hyponatremia: Sodium of 133 -Continue IV fluids.   -recheck bmet in AM  Hypertension: Stable -Continue metoprolol, hold lisinopril-HCTZ due to AKI. -Hydralazine as needed -cont to titrate bp meds accordingly  Diabetes mellitus: Uncontrolled. A1c today is 10.9% -Continue Levemir 50 units at bedtime. Started on sliding scale insulin and monitor blood sugar closely.   -Glycemic trends remain in the 200's. Have added 4 units meal coverage per Diabetic Coordinator recs  Hypomagnesemia: Replenished -remains low this AM -Given 4gm Mg -Repeat levels in AM  Diabetic neuropathy: Continue Lyrica as tolerated  Hyperlipidemia: Continue statin  Tubular adenoma: Had colonoscopy in 2018 -Reviewed biopsy results from 2018 which shows-tubular adenoma in ascending colon-negative for high-grade dysplasia and malignancy. -No history of melena. -Recommend follow-up with GI outpatient.  History of obstructive sleep apnea- noted to be noncompliant with CPAP   DVT prophylaxis: Lovenox subq Code Status: Full Family Communication: Pt in room, family not at  bedside  Status is: Inpatient  Remains inpatient appropriate because:Ongoing diagnostic testing needed not appropriate for outpatient work up  and IV treatments appropriate due to intensity of illness or inability to take PO   Dispo: The patient is from: Home              Anticipated d/c is to: Home              Anticipated d/c date is: 2 days              Patient currently is not medically stable to d/c.   Consultants:   Podiatry  Procedures:     Antimicrobials: Anti-infectives (From admission, onward)   Start     Dose/Rate Route Frequency Ordered Stop   09/14/19 1030  vancomycin (VANCOCIN) IVPB 1000 mg/200 mL premix     Discontinue     1,000 mg 200 mL/hr over 60 Minutes Intravenous Every 12 hours 09/14/19 1008     09/13/19 2200  piperacillin-tazobactam (ZOSYN) IVPB 3.375 g  Status:  Discontinued        3.375 g 12.5 mL/hr over 240 Minutes Intravenous Every 8 hours 09/13/19 1356 09/13/19 1404   09/13/19 1930  piperacillin-tazobactam (ZOSYN) IVPB 3.375 g     Discontinue     3.375 g 12.5 mL/hr over 240 Minutes Intravenous Every 8 hours 09/13/19 1404     09/13/19 1500  vancomycin (VANCOREADY) IVPB 2000 mg/400 mL        2,000 mg 200 mL/hr over 120 Minutes Intravenous  Once 09/13/19 1352 09/13/19 1816   09/13/19 1356  vancomycin variable dose per unstable renal function (pharmacist dosing)  Status:  Discontinued         Does not apply See admin instructions 09/13/19 1356 09/14/19 1008   09/13/19 1245  piperacillin-tazobactam (ZOSYN) IVPB 3.375 g        3.375 g 100 mL/hr over 30 Minutes Intravenous  Once 09/13/19 1255 09/13/19 1403   09/13/19 1245  vancomycin (VANCOCIN) IVPB 1000 mg/200 mL premix  Status:  Discontinued        1,000 mg 200 mL/hr over 60 Minutes Intravenous  Once 09/13/19 1255 09/13/19 1351       Subjective: Without complaints this AM  Objective: Vitals:   09/13/19 1932 09/14/19 0323 09/14/19 0758 09/14/19 1407  BP: 107/61 108/65 107/66 111/72  Pulse: 90 85 92 91  Resp: 17 18 16 17   Temp: 98.7 F (37.1 C) 99 F (37.2 C) 97.7 F (36.5 C) 98.1 F (36.7 C)  TempSrc: Oral Oral Oral Oral  SpO2:  95% 97% 97% 97%  Weight:      Height:        Intake/Output Summary (Last 24 hours) at 09/14/2019 1736 Last data filed at 09/14/2019 0800 Gross per 24 hour  Intake 240 ml  Output --  Net 240 ml   Filed Weights   09/13/19 1305  Weight: 121.7 kg    Examination:  General exam: Appears calm and comfortable  Respiratory system: Clear to auscultation. Respiratory effort normal. Cardiovascular system: S1 & S2 heard, Regular Gastrointestinal system: Abdomen is nondistended, soft and nontender. No organomegaly or masses felt. Normal bowel sounds heard. Central nervous system: Alert and oriented. No focal neurological deficits. Extremities: Symmetric 5 x 5 power. Skin: No rashes, lesions  Psychiatry: Judgement and insight appear normal. Mood & affect appropriate.   Data Reviewed: I have personally reviewed following labs and imaging studies  CBC: Recent Labs  Lab 09/13/19 1057 09/13/19 1330 09/14/19 0840  WBC 14.6* 14.7* 13.8*  NEUTROABS 9.5*  --   --   HGB 13.6 13.0 12.7*  HCT 42.2 40.0 38.3*  MCV 90.8 91.1 89.3  PLT 279 269 509   Basic Metabolic Panel: Recent Labs  Lab 09/13/19 1057 09/13/19 1330 09/14/19 0840  NA 133*  --  132*  K 4.1  --  3.8  CL 99  --  100  CO2 20*  --  22  GLUCOSE 431*  --  216*  BUN 24*  --  22  CREATININE 1.27* 1.21 1.13  CALCIUM 9.3  --  8.5*  MG  --  1.4* 1.5*  PHOS  --  3.7  --    GFR: Estimated Creatinine Clearance: 85.4 mL/min (by C-G formula based on SCr of 1.13 mg/dL). Liver Function Tests: Recent Labs  Lab 09/13/19 1057 09/14/19 0840  AST 11* 12*  ALT 11 10  ALKPHOS 69 60  BILITOT 0.8 1.2  PROT 7.3 6.8  ALBUMIN 2.9* 2.6*   No results for input(s): LIPASE, AMYLASE in the last 168 hours. No results for input(s): AMMONIA in the last 168 hours. Coagulation Profile: No results for input(s): INR, PROTIME in the last 168 hours. Cardiac Enzymes: No results for input(s): CKTOTAL, CKMB, CKMBINDEX, TROPONINI in the last 168  hours. BNP (last 3 results) No results for input(s): PROBNP in the last 8760 hours. HbA1C: Recent Labs    09/13/19 1330  HGBA1C 10.7*   CBG: Recent Labs  Lab 09/13/19 1653 09/13/19 2103 09/14/19 0756 09/14/19 1118 09/14/19 1626  GLUCAP 313* 280* 180* 224* 273*   Lipid Profile: No results for input(s): CHOL, HDL, LDLCALC, TRIG, CHOLHDL, LDLDIRECT in the last 72 hours. Thyroid Function Tests: No results for input(s): TSH, T4TOTAL, FREET4, T3FREE, THYROIDAB in the last 72 hours. Anemia Panel: No results for input(s): VITAMINB12, FOLATE, FERRITIN, TIBC, IRON, RETICCTPCT in the last 72 hours. Sepsis Labs: Recent Labs  Lab 09/13/19 1057 09/13/19 1330  LATICACIDVEN 2.3* 2.2*    Recent Results (from the past 240 hour(s))  SARS Coronavirus 2 by RT PCR (hospital order, performed in [facility], [facility], [facility] hospital lab) Nasopharyngeal Nasopharyngeal Swab     Status: None   Collection Time: 09/13/19  1:07 PM   Specimen: Nasopharyngeal Swab  Result Value Ref Range Status   SARS Coronavirus 2 NEGATIVE NEGATIVE Final    Comment: (NOTE) SARS-CoV-2 target nucleic acids are NOT DETECTED.  The SARS-CoV-2 RNA is generally detectable in upper and lower respiratory specimens during the acute phase of infection. The lowest concentration of SARS-CoV-2 viral copies this assay can detect is 250 copies / mL. A negative result does not preclude SARS-CoV-2 infection and should not be used as the sole basis for treatment or other patient management decisions.  A negative result may occur with improper specimen collection / handling, submission of specimen other than nasopharyngeal swab, presence of viral mutation(s) within the areas targeted by this assay, and inadequate number of viral copies (<250 copies / mL). A negative result must be combined with clinical observations, patient history, and epidemiological information.  Fact Sheet for Patients:   StrictlyIdeas.no  Fact  Sheet for Healthcare Providers: BankingDealers.co.za  This test is not yet approved or  cleared by the [place] FDA and has been authorized for detection and/or diagnosis of SARS-CoV-2 by FDA under an Emergency Use Authorization (EUA).  This EUA will remain in effect (meaning this test can be used) for the duration of the COVID-19 declaration under Section 564(b)(1) of the Act, 21  U.S.C. section 360bbb-3(b)(1), unless the authorization is terminated or revoked sooner.  Performed at [facility], [place] [address], [place], [place] [address]   Culture, blood (routine x 2)     Status: None (Preliminary result)   Collection Time: 09/13/19  1:15 PM   Specimen: BLOOD LEFT HAND  Result Value Ref Range Status   Specimen Description BLOOD LEFT HAND  Final   Special Requests   Final    BOTTLES DRAWN AEROBIC AND ANAEROBIC Blood Culture adequate volume   Culture   Final    NO GROWTH < 24 HOURS Performed at [facility], [place] [address] [place], [place] [address]    Report Status PENDING  Incomplete  Culture, blood (routine x 2)     Status: None (Preliminary result)   Collection Time: 09/13/19  1:20 PM   Specimen: BLOOD  Result Value Ref Range Status   Specimen Description BLOOD RIGHT ANTECUBITAL  Final   Special Requests   Final    BOTTLES DRAWN AEROBIC AND ANAEROBIC Blood Culture adequate volume   Culture   Final    NO GROWTH < 24 HOURS Performed at [facility], [place] [address] [place], [place] [address]    Report Status PENDING  Incomplete  Surgical pcr screen     Status: None   Collection Time: 09/14/19  8:06 AM   Specimen: Nasal Mucosa; Nasal Swab  Result Value Ref Range Status   MRSA, PCR NEGATIVE NEGATIVE Final   Staphylococcus aureus NEGATIVE NEGATIVE Final    Comment: (NOTE) The Xpert SA Assay (FDA approved for NASAL specimens in patients 24 years of age and older), is one component of a comprehensive surveillance program. It is  not intended to diagnose infection nor to guide or monitor treatment. Performed at [facility], [place] [address], [place], [place] [address]      Radiology Studies: DG Chest 2 View  Result Date: 09/13/2019 CLINICAL DATA:  Toe infection. EXAM: CHEST - 2 VIEW COMPARISON:  06/16/2019. FINDINGS: Mediastinum and hilar structures normal. Prominent epicardial fat pads again. Heart size normal. Low lung volumes mild basilar atelectasis. Mild bibasilar atelectasis. No pleural effusion or pneumothorax. Degenerative change thoracic spine. IMPRESSION: Low lung volumes.  With mild basilar atelectasis. Electronically Signed   By: Marcello Moores  Register   On: 09/13/2019 11:19   DG Foot Complete Right  Result Date: 09/13/2019 CLINICAL DATA:  Infection second digit EXAM: RIGHT FOOT COMPLETE - 3+ VIEW COMPARISON:  None. FINDINGS: Frontal, oblique, and lateral views were obtained. There is overlying bandage at the level of the second distal phalanx. There is apparent cortical irregularity along the lateral aspect of the distal aspect of the second distal phalanx, best seen on frontal view. A small focus of bony destruction in this area is questioned. This finding is not confirmed on the lateral view. No other areas suggesting potential bony destruction seen. No fracture or dislocation. There is marked narrowing and bony overgrowth at the first MTP joint with subchondral cystic change. No erosion in this area. Other joint spaces appear unremarkable. Note that there is spurring in the dorsal midfoot. There are small posterior and inferior calcaneal spurs. IMPRESSION: Suggestion of a focus of potential bony destruction along the lateral most aspect of the distal portion of the second distal phalanx seen only on frontal view. Overlying bandage makes evaluation of this area difficult. It may be reasonable to consider reimaging after removal of the bandage given artifact from the bandage. No  other findings suggesting potential  bony destruction. No fracture or dislocation. 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 There is spurring in the dorsal midfoot. There are small calcaneal spurs. Electronically Signed   By: Lowella Grip III M.D.   On: 09/13/2019 14:13    Scheduled Meds: . atorvastatin  40 mg Oral q1800  . enoxaparin (LOVENOX) injection  40 mg Subcutaneous Q24H  . insulin aspart  0-15 Units Subcutaneous TID WC  . insulin aspart  0-5 Units Subcutaneous QHS  . insulin detemir  50 Units Subcutaneous QHS  . metoprolol succinate  25 mg Oral BH-q7a  . pregabalin  75 mg Oral BID  . sodium chloride flush  3 mL Intravenous Once   Continuous Infusions: . sodium chloride 75 mL/hr at 09/14/19 1651  . piperacillin-tazobactam (ZOSYN)  IV 3.375 g (09/14/19 1132)  . vancomycin 1,000 mg (09/14/19 1118)     LOS: 1 day   Marylu Lund, MD [facility] Pager On Amion  If 7PM-7AM, please contact night-coverage 09/14/2019, 5:36 PM

## 2019-09-14 NOTE — Progress Notes (Signed)
Pharmacy Antibiotic Note  Christopher Escobar is a 64 y.o. male admitted on 09/13/2019 with diabetic foot infection concerning for osteomyelitis. Amputation planned for 6/1. Pharmacy has been consulted for vancomycin and Zosyn dosing.  Patient is afebrile. WBC 13.8, Scr 1.13 down trending. UOP not documented. Cultures pending. Imaging did not confirm osteomyelitis.   Plan: Vancomycin 1000mg  Q12 hr. Goal trough 15-20 mcg/mL. Zosyn 3.375g IV q8h (4 hour infusion). Will monitor renal function for subsequent doses Monitor clinical status, LOT, de-escalation F/u abx plan post-amputation   Temp (24hrs), Avg:98.2 F (36.8 C), Min:97.7 F (36.5 C), Max:99 F (37.2 C)  Recent Labs  Lab 09/13/19 1057 09/13/19 1330 09/14/19 0840  WBC 14.6* 14.7* 13.8*  CREATININE 1.27* 1.21 1.13  LATICACIDVEN 2.3* 2.2*  --     Estimated Creatinine Clearance: 85.4 mL/min (by C-G formula based on SCr of 1.13 mg/dL).    No Known Allergies  Antimicrobials this admission: zosyn 6/15 >>  vancomycin 6/15 >>    Microbiology results: 6/15 BCx: sent   Thank you for allowing pharmacy to be a part of this patient's care.  Benetta Spar, PharmD, BCPS, BCCP Clinical Pharmacist  Please check AMION for all [place] phone numbers After 10:00 PM, call [place] [phone]

## 2019-09-14 NOTE — Progress Notes (Signed)
Pharmacy Antibiotic Note  Christopher Escobar is a 64 y.o. male admitted on 09/13/2019 with wound infection.  Pharmacy has been consulted for Cefepime dosing.  ID: abx for toe infxn. Concern for osteo-not confirmed on imaging. Afeb. WBC 13.8  Zosyn 6/15>>6/16 Cefepime 6/16 Vanco 6/15 >> Doxy 6/8>> 6/15  6/15 Bcx: IP  Plan: - vanco 1g q12hr - Cefepime 2g IV q8hr -6/17 toe amputation > f/u LOT, de-escalation     Height: 5\' 11"  (180.3 cm) Weight: 121.7 kg (268 lb 4.8 oz) IBW/kg (Calculated) : 75.3  Temp (24hrs), Avg:98.4 F (36.9 C), Min:97.7 F (36.5 C), Max:99 F (37.2 C)  Recent Labs  Lab 09/13/19 1057 09/13/19 1330 09/14/19 0840  WBC 14.6* 14.7* 13.8*  CREATININE 1.27* 1.21 1.13  LATICACIDVEN 2.3* 2.2*  --     Estimated Creatinine Clearance: 85.4 mL/min (by C-G formula based on SCr of 1.13 mg/dL).    No Known Allergies   Karrie Fluellen S. Alford Highland, PharmD, BCPS Clinical Staff Pharmacist [email or web address] [place] 09/14/2019 5:47 PM

## 2019-09-14 NOTE — Progress Notes (Signed)
Anesthesia Chart Review:  Case: [id number] Date/Time: 09/15/19 0715   Procedure: AMPUTATION RIGHT 2ND DIGIT (Right )   Anesthesia type: Choice   Pre-op diagnosis: RIGHT 2ND DIGIT OSTEOMYELITIS   Location: [facility] OR ROOM 12 / [facility]   Surgeons: Felipa Furnace, DPM      DISCUSSION: Patient is a 64 year old male scheduled for the above procedure.  He is currently admitted to [facility] (admitted 09/13/19) for right foot infection with need for IV antibiotics and plans for right second toe amputation on 09/15/2019. WBC 14.6K.  A1c elevated at 10.7%, and SSI added during hospitalization. Hydrated for AKI (Cr 1.27).   Other history includes former smoker (quit 02/03/07), HTN, DM2, OSA (non-compliant with CPAP), tubular adenoma.   Negative COVID-19 test on 09/13/2019.  Blood cultures in process.  Anesthesia team to evaluate prior to surgery.   VS: On 09/14/19, BP 107/66, HR 92 bpm.    PROVIDERS: Albina Billet, MD is listed as PCP - Last cardiology visit seen was on 06/03/17 by Kandis Cocking, MD ([place]) for preoperative evaluation for consideration of weight reduction surgery. Work up in 2011 showed no significant CAD with EF 45% (by echo). EKG showed SR, PVC, LAFB, inferior infarct. No further cardiac testing recommended at that time   LABS: Currently last lab results include: Lab Results  Component Value Date   WBC 13.8 (H) 09/14/2019   HGB 12.7 (L) 09/14/2019   HCT 38.3 (L) 09/14/2019   PLT 251 09/14/2019   GLUCOSE 216 (H) 09/14/2019   ALT 10 09/14/2019   AST 12 (L) 09/14/2019   NA 132 (L) 09/14/2019   K 3.8 09/14/2019   CL 100 09/14/2019   CREATININE 1.13 09/14/2019   BUN 22 09/14/2019   CO2 22 09/14/2019   HGBA1C 10.7 (H) 09/13/2019     IMAGES: CXR 09/13/19: FINDINGS: Mediastinum and hilar structures normal. Prominent epicardial fat pads again. Heart size normal. Low lung volumes mild basilar atelectasis. Mild bibasilar atelectasis. No pleural effusion or pneumothorax. Degenerative  change thoracic spine. IMPRESSION: Low lung volumes.  With mild basilar atelectasis.   EKG: Currently last EKG tracing noted is from 02/03/17: Normal sinus rhythm Left anterior fascicular block Inferior infarct , age undetermined Cannot rule out Anterior infarct , age undetermined Abnormal ECG No previous ECGs available Confirmed by Neoma Laming [phone]) on 02/03/2017 2:48:34 PM  By Result Narrative 06/15/17 EKG ([place]) showed: SINUS RHYTHM WITH OCCASIONAL PREMATURE VENTRICULAR BEATS  LOW VOLTAGE QRS  LEFT ANTERIOR FASCICULAR BLOCK  INFERIOR INFARCT (CITED ON OR BEFORE 11-May-2004)  POOR R WAVE PROGRESSION IN ANTERIOR PRECORDIAL LEADS  ABNORMAL ECG  WHEN COMPARED WITH ECG OF 11-Mar-2015 23:13,  ABERRANT CONDUCTION IS NO LONGER PRESENT  Confirmed by Kandis Cocking ([id number]) on 06/15/2017 7:18:18 PM   CV: Cardiac cath 03/13/10 ([facility]): DIAGNOSTIC SUMMARY  Coronary Artery Disease  Left Main:normal  LAD system:insignificant (20% proximal LAD, 30% mid LAD) LCX system:normal  RCA system:normal  No significant CAD indicated   Echo 03/13/10 ([facility]): INTERPRETATION ---------------------------------------------------------------  MILD LV DYSFUNCTION (Estimated EF 45%) WITH MILD LVH  NORMAL RIGHT VENTRICULAR SYSTOLIC FUNCTION  VALVULAR REGURGITATION: TRIVIAL MR  NO VALVULAR STENOSIS  NO PRIOR FOR COMPARISON  DEFINITY CONTRAST USED  REGIONAL WALL MOTION ABNORMALITIES (lateral: hypocontractile; posterior: hypocontractile; normal anterior, apical, inferior) POOR SOUND TRANSMISSION     Past Medical History:  Diagnosis Date  . Diabetes mellitus without complication ([place]) 6546  . Hypertension 2008  . Personal history of colonic polyps  TA polyp  . Sleep apnea    not using cpap    Past Surgical History:  Procedure Laterality Date  . BACK SURGERY  1989  . COLONOSCOPY  2013  . COLONOSCOPY WITH PROPOFOL N/A 10/28/2016    Procedure: COLONOSCOPY WITH PROPOFOL;  Surgeon: Robert Bellow, MD;  Location: [facility] ENDOSCOPY;  Service: Endoscopy;  Laterality: N/A;  . SHOULDER ARTHROSCOPY WITH SUBACROMIAL DECOMPRESSION Left 04/02/2017   Procedure: SHOULDER ARTHROSCOPY WITH DECOMPRESSION, DEBRIDEMENT, SLAP REPAIR,;  Surgeon: Corky Mull, MD;  Location: [facility] ORS;  Service: Orthopedics;  Laterality: Left;  . SHOULDER SURGERY Bilateral 2011,2012    MEDICATIONS: No current facility-administered medications for this encounter.   No current outpatient medications on file.   Marland Kitchen 0.9 %  sodium chloride infusion  . acetaminophen (TYLENOL) tablet 650 mg   Or  . acetaminophen (TYLENOL) suppository 650 mg  . atorvastatin (LIPITOR) tablet 40 mg  . enoxaparin (LOVENOX) injection 40 mg  . fentaNYL (SUBLIMAZE) injection 50 mcg  . HYDROmorphone (DILAUDID) injection 0.5-1 mg  . insulin aspart (novoLOG) injection 0-15 Units  . insulin aspart (novoLOG) injection 0-5 Units  . insulin detemir (LEVEMIR) injection 50 Units  . metoprolol succinate (TOPROL-XL) 24 hr tablet 25 mg  . ondansetron (ZOFRAN) tablet 4 mg   Or  . ondansetron (ZOFRAN) injection 4 mg  . oxyCODONE (Oxy IR/ROXICODONE) immediate release tablet 5 mg  . piperacillin-tazobactam (ZOSYN) IVPB 3.375 g  . pregabalin (LYRICA) capsule 75 mg  . sodium chloride flush (NS) 0.9 % injection 3 mL  . vancomycin (VANCOCIN) IVPB 1000 mg/200 mL premix    Myra Gianotti, PA-C Surgical Short Stay/Anesthesiology [facility] Phone [phone] [facility] Phone [phone] 09/14/2019 10:53 AM

## 2019-09-14 NOTE — Anesthesia Preprocedure Evaluation (Addendum)
Anesthesia Evaluation  Patient identified by MRN, date of birth, ID band Patient awake    Reviewed: Allergy & Precautions, NPO status , Patient's Chart, lab work & pertinent test results  History of Anesthesia Complications Negative for: history of anesthetic complications  Airway Mallampati: II  TM Distance: >3 FB Neck ROM: Full    Dental  (+) Dental Advisory Given, Lower Dentures, Upper Dentures   Pulmonary sleep apnea , former smoker,    Pulmonary exam normal breath sounds clear to auscultation       Cardiovascular hypertension, Pt. on home beta blockers and Pt. on medications Normal cardiovascular exam Rhythm:Regular Rate:Normal     Neuro/Psych negative neurological ROS  negative psych ROS   GI/Hepatic negative GI ROS, Neg liver ROS,   Endo/Other  diabetes, Type 2, Oral Hypoglycemic Agents, Insulin DependentObesity   Renal/GU negative Renal ROS     Musculoskeletal negative musculoskeletal ROS (+)   Abdominal   Peds  Hematology  (+) Blood dyscrasia, anemia ,   Anesthesia Other Findings   Reproductive/Obstetrics                           Anesthesia Physical Anesthesia Plan  ASA: III  Anesthesia Plan: General   Post-op Pain Management:    Induction: Intravenous  PONV Risk Score and Plan: 2 and Dexamethasone, Ondansetron and Midazolam  Airway Management Planned: LMA  Additional Equipment:   Intra-op Plan:   Post-operative Plan: Extubation in OR  Informed Consent: I have reviewed the patients History and Physical, chart, labs and discussed the procedure including the risks, benefits and alternatives for the proposed anesthesia with the patient or authorized representative who has indicated his/her understanding and acceptance.     Dental advisory given  Plan Discussed with: CRNA  Anesthesia Plan Comments: (Note written by Myra Gianotti, PA-C. Admitted 09/13/19 for foot  infection. )      Anesthesia Quick Evaluation

## 2019-09-14 NOTE — Consult Note (Signed)
  Subjective:  Patient ID: Christopher Escobar, male    DOB: 1955-05-28,  MRN: [id number]  A 64 y.o. male presents with medical history significant of hypertension, hyperlipidemia diabetes, morbid obesity, sleep apnea, tubular adenoma presents with right second digit diabetic foot infection.  Patient was seen by me in clinic yesterday for worsening second digit infection.  There was purulent drainage that was expressed in clinic with probing down to bone concern for osteomyelitis.  Upon presenting to the ED patient white blood cell count was 14.  His A1c was 10.9.  Today this morning he is doing well off.  He states he is feeling little bit better.  He is on broad-spectrum antibiotics.  He denies any nausea fever chills vomiting.  He was on antibiotics as an outpatient.  Objective:   Vitals:   09/13/19 1932 09/14/19 0323  BP: 107/61 108/65  Pulse: 90 85  Resp: 17 18  Temp: 98.7 F (37.1 C) 99 F (37.2 C)  SpO2: 95% 97%   General AA&O x3. Normal mood and affect.  Vascular Dorsalis pedis and posterior tibial pulses 2/4 bilat. Brisk capillary refill to all digits. Pedal hair present.  Neurologic Epicritic sensation grossly intact.  Dermatologic  right second digit distal tip ulceration probes down to bone.  Cellulitis up to the MPJ.  Epidermal lysis noted.  Edema of the second digit noted. Nails well groomed and normal in appearance.  Orthopedic: MMT 5/5 in dorsiflexion, plantarflexion, inversion, and eversion. Normal joint ROM without pain or crepitus.   Suggestion of a focus of potential bony destruction along the lateral most aspect of the distal portion of the second distal phalanx seen only on frontal view. Overlying bandage makes evaluation of this area difficult. It may be reasonable to consider reimaging after removal of the bandage given artifact from the bandage. No other findings suggesting potential bony destruction.  No fracture or dislocation. Severe osteoarthritis in the  first MTP joint. There is spurring in the dorsal midfoot. There are small calcaneal spurs.  Assessment & Plan:  Patient was evaluated and treated and all questions answered.  Right second digit diabetic foot infection with underlying osteomyelitis -I discussed my findings and reviewed my x-ray findings with the patient and there is a concern for osteomyelitic changes with cortical destruction of the second digit of the right foot.  Given clinically that I am able to probe down to bone with exposed distal phalanx there is a high correlation that there is osteomyelitis present of the distal phalanx of the second digit right foot. -Continue broad-spectrum IV antibiotics -I plan on taking the patient to the operating room tomorrow for right second digit amputation at the level of the metatarsophalangeal joint. -I discussed with the patient the importance of maintaining blood glucose his last A1c was 10.9.  I discussed with him that there is a high risk of wound complication even after undergoing an amputation.  I am worried that he might not be able to heal the amputation site if his sugars are uncontrolled. -Local wound care with Betadine wet-to-dry dressing changes -After the amputation, he will be weightbearing as tolerated with surgical shoe -  Felipa Furnace, DPM  Accessible via secure chat for questions or concerns.

## 2019-09-14 NOTE — Discharge Instructions (Signed)
Local Endocrinologists [facility] ([phone]) 1. Dr. Cristina Gherghe 2. Dr. Ajay Kumar [facility] ([phone]) 1. Dr. Jeffrey Kerr [facility] ([phone]) 1. Dr. Bindubal Balan 2. Dr. Walter Kohut [facility] ([phone]) 1. Dr. Stephen [facility] ([phone]) [[place] office]  ([phone]) [[place] office] 1. Dr. Melissa Solum 2. Dr. Thomas O'Connell [facility] ([facility]) ([phone]) 1. Autumn Hudnall ([place]), PA 2. Dr. Dhaval Patel 3. Dr. William Smith Jr. [facility] ([phone]) 1. Dr. Gebre Nida [facility] [phone]) 1. Dr. Micheal Brennan 2. Dr. Jennifer Badik 3. Dr. Ashley Jessup 4. Spencer Beasley, FNP Dr. Monica E. Doerr in [place] ([phone]) 

## 2019-09-14 NOTE — Progress Notes (Signed)
Inpatient Diabetes Program Recommendations  AACE/ADA: New Consensus Statement on Inpatient Glycemic Control (2015)  Target Ranges:  Prepandial:   less than 140 mg/dL      Peak postprandial:   less than 180 mg/dL (1-2 hours)      Critically ill patients:  140 - 180 mg/dL   Lab Results  Component Value Date   GLUCAP 224 (H) 09/14/2019   HGBA1C 10.7 (H) 09/13/2019    Review of Glycemic Control Results for Christopher Escobar, Christopher "DON" (MRN [id number]) as of 09/14/2019 15:00  Ref. Range 09/13/2019 16:53 09/13/2019 21:03 09/14/2019 07:56 09/14/2019 11:18  Glucose-Capillary Latest Ref Range: 70 - 99 mg/dL 313 (H) 280 (H) 180 (H) 224 (H)   Diabetes history: Type 2 DMN Outpatient Diabetes medications: Levemir 50 units QHS, Novolog 70/30 24 units BID, Amaryl 4 mg BID Current orders for Inpatient glycemic control: Levemir 50 units QHS, Novolog 09-15 units TID, Novolog 0-5 units QHS  Inpatient Diabetes Program Recommendations:    Consider adding Novolog 4 units TID (assuming patient consuming >50% of meals).   Spoke with patient regarding outpatient diabetes management. Verified home medications, however admits to missing Levemir frequently due to working night shift and exhaustion. Reports that he is not planning to return to work, so doses will no longer be missed.  Reviewed patient's current A1c of 10.7%. Explained what a A1c is and what it measures. Also reviewed goal A1c with patient, importance of good glucose control @ home, and blood sugar goals. Reviewed patho of DM, need for insulin, role of pancreas, benefits of 70/30 vs Levemir, signs and symptoms of hypoglycemia, vascular changes, impact of infection and commorbidities.  Patient has a meter and testing supplies. Encouraged to check 2-3 times per day. Reviewed when to call MD and discouraged from taking 70/30 then going to sleep due to risk of hypoglycemia.  Will place endocrinology list to DC summary.  Denies drinking sugary beverages but  reports that he tends to "overindulge" and could do better. Reviewed plate method, how to read nutritional labels, and encouraged mindfulness.  Patient does not have any further questions at this time.   Thanks, Bronson Curb, MSN, RNC-OB Diabetes Coordinator [phone] (8a-5p)

## 2019-09-15 ENCOUNTER — Encounter (HOSPITAL_COMMUNITY): Payer: Self-pay | Admitting: Podiatry

## 2019-09-15 ENCOUNTER — Inpatient Hospital Stay (HOSPITAL_COMMUNITY): Payer: BC Managed Care – PPO

## 2019-09-15 ENCOUNTER — Inpatient Hospital Stay (HOSPITAL_COMMUNITY): Payer: BC Managed Care – PPO | Admitting: Vascular Surgery

## 2019-09-15 ENCOUNTER — Inpatient Hospital Stay (HOSPITAL_COMMUNITY): Admission: RE | Admit: 2019-09-15 | Payer: Medicare Other | Source: Home / Self Care | Admitting: Podiatry

## 2019-09-15 ENCOUNTER — Encounter (HOSPITAL_COMMUNITY)
Admission: EM | Disposition: A | Discharge status: Home / Self Care | Payer: Self-pay | Source: Ambulatory Visit | Attending: Internal Medicine

## 2019-09-15 DIAGNOSIS — M86671 Other chronic osteomyelitis, right ankle and foot: Secondary | ICD-10-CM

## 2019-09-15 HISTORY — PX: AMPUTATION: SHX166

## 2019-09-15 LAB — COMPREHENSIVE METABOLIC PANEL
ALT: 9 U/L (ref 0–44)
AST: 11 U/L — ABNORMAL LOW (ref 15–41)
Albumin: 2.3 g/dL — ABNORMAL LOW (ref 3.5–5.0)
Alkaline Phosphatase: 55 U/L (ref 38–126)
Anion gap: 8 (ref 5–15)
BUN: 18 mg/dL (ref 8–23)
CO2: 21 mmol/L — ABNORMAL LOW (ref 22–32)
Calcium: 8.2 mg/dL — ABNORMAL LOW (ref 8.9–10.3)
Chloride: 105 mmol/L (ref 98–111)
Creatinine, Ser: 0.99 mg/dL (ref 0.61–1.24)
GFR calc Af Amer: >60 mL/min (ref >60)
GFR calc non Af Amer: >60 mL/min (ref >60)
Glucose, Bld: 187 mg/dL — ABNORMAL HIGH (ref 70–99)
Potassium: 3.8 mmol/L (ref 3.5–5.1)
Sodium: 134 mmol/L — ABNORMAL LOW (ref 135–145)
Total Bilirubin: 0.9 mg/dL (ref 0.3–1.2)
Total Protein: 6.4 g/dL — ABNORMAL LOW (ref 6.5–8.1)

## 2019-09-15 LAB — CBC
HCT: 36.6 % — ABNORMAL LOW (ref 39.0–52.0)
Hemoglobin: 12.2 g/dL — ABNORMAL LOW (ref 13.0–17.0)
MCH: 29.5 pg (ref 26.0–34.0)
MCHC: 33.3 g/dL (ref 30.0–36.0)
MCV: 88.6 fL (ref 80.0–100.0)
Platelets: 256 10*3/uL (ref 150–400)
RBC: 4.13 MIL/uL — ABNORMAL LOW (ref 4.22–5.81)
RDW: 13 % (ref 11.5–15.5)
WBC: 12.7 10*3/uL — ABNORMAL HIGH (ref 4.0–10.5)
nRBC: 0 % (ref 0.0–0.2)

## 2019-09-15 LAB — GLUCOSE, CAPILLARY
Glucose-Capillary: 151 mg/dL — ABNORMAL HIGH (ref 70–99)
Glucose-Capillary: 170 mg/dL — ABNORMAL HIGH (ref 70–99)
Glucose-Capillary: 189 mg/dL — ABNORMAL HIGH (ref 70–99)
Glucose-Capillary: 193 mg/dL — ABNORMAL HIGH (ref 70–99)
Glucose-Capillary: 210 mg/dL — ABNORMAL HIGH (ref 70–99)
Glucose-Capillary: 215 mg/dL — ABNORMAL HIGH (ref 70–99)

## 2019-09-15 LAB — MAGNESIUM: Magnesium: 1.8 mg/dL (ref 1.7–2.4)

## 2019-09-15 IMAGING — DX DG FOOT COMPLETE 3+V*R*
3 series · 3 of 3 positions shown · non-contrast
Comparison: [DATE]

CLINICAL DATA: Status post second digit amputation

EXAM:
RIGHT FOOT COMPLETE - 3+ VIEW

[foot ap]
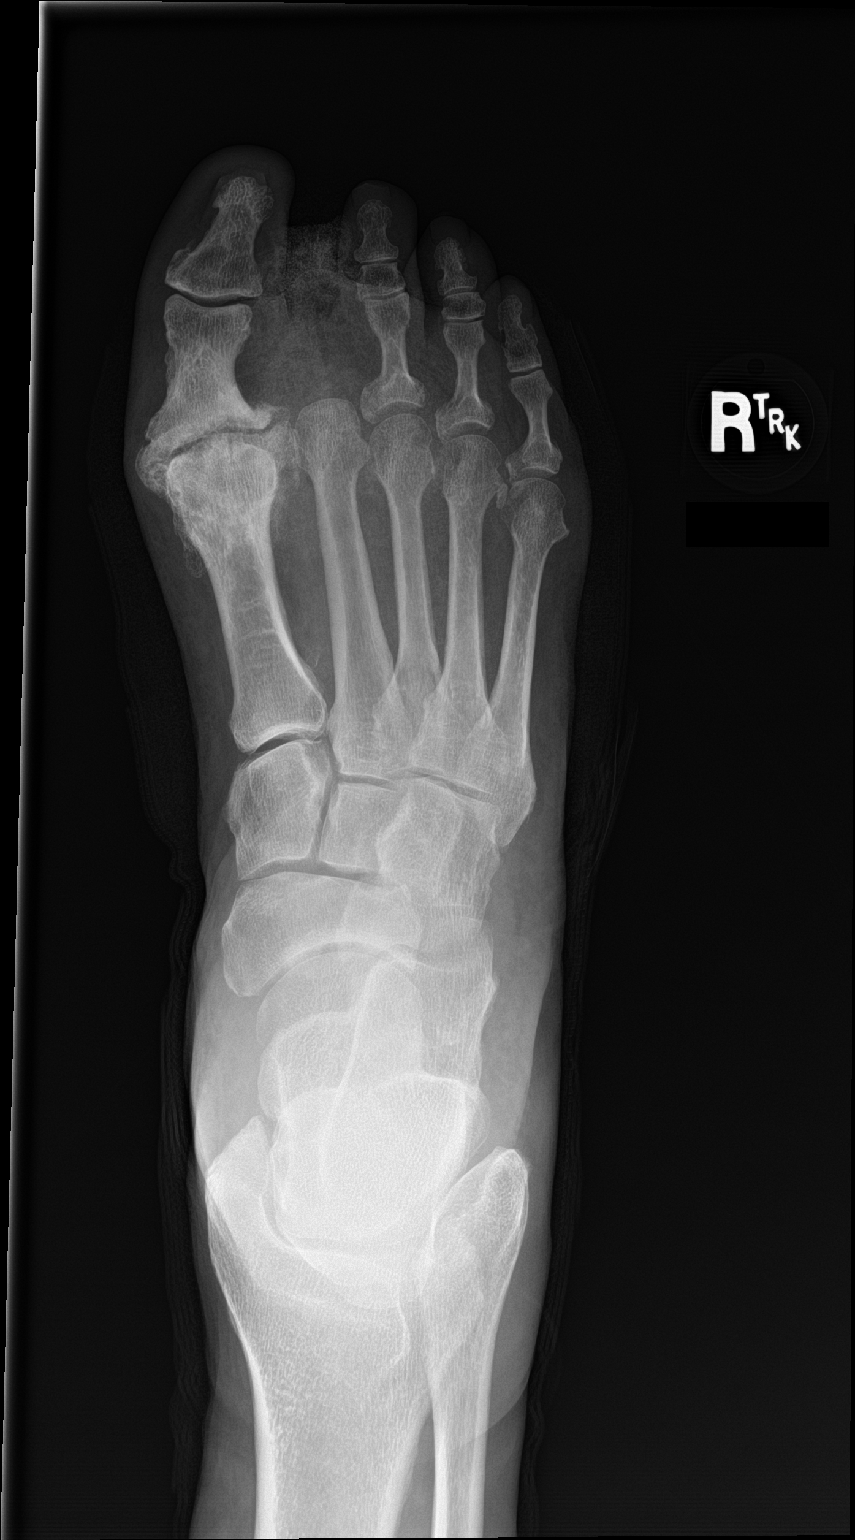

[foot obl]
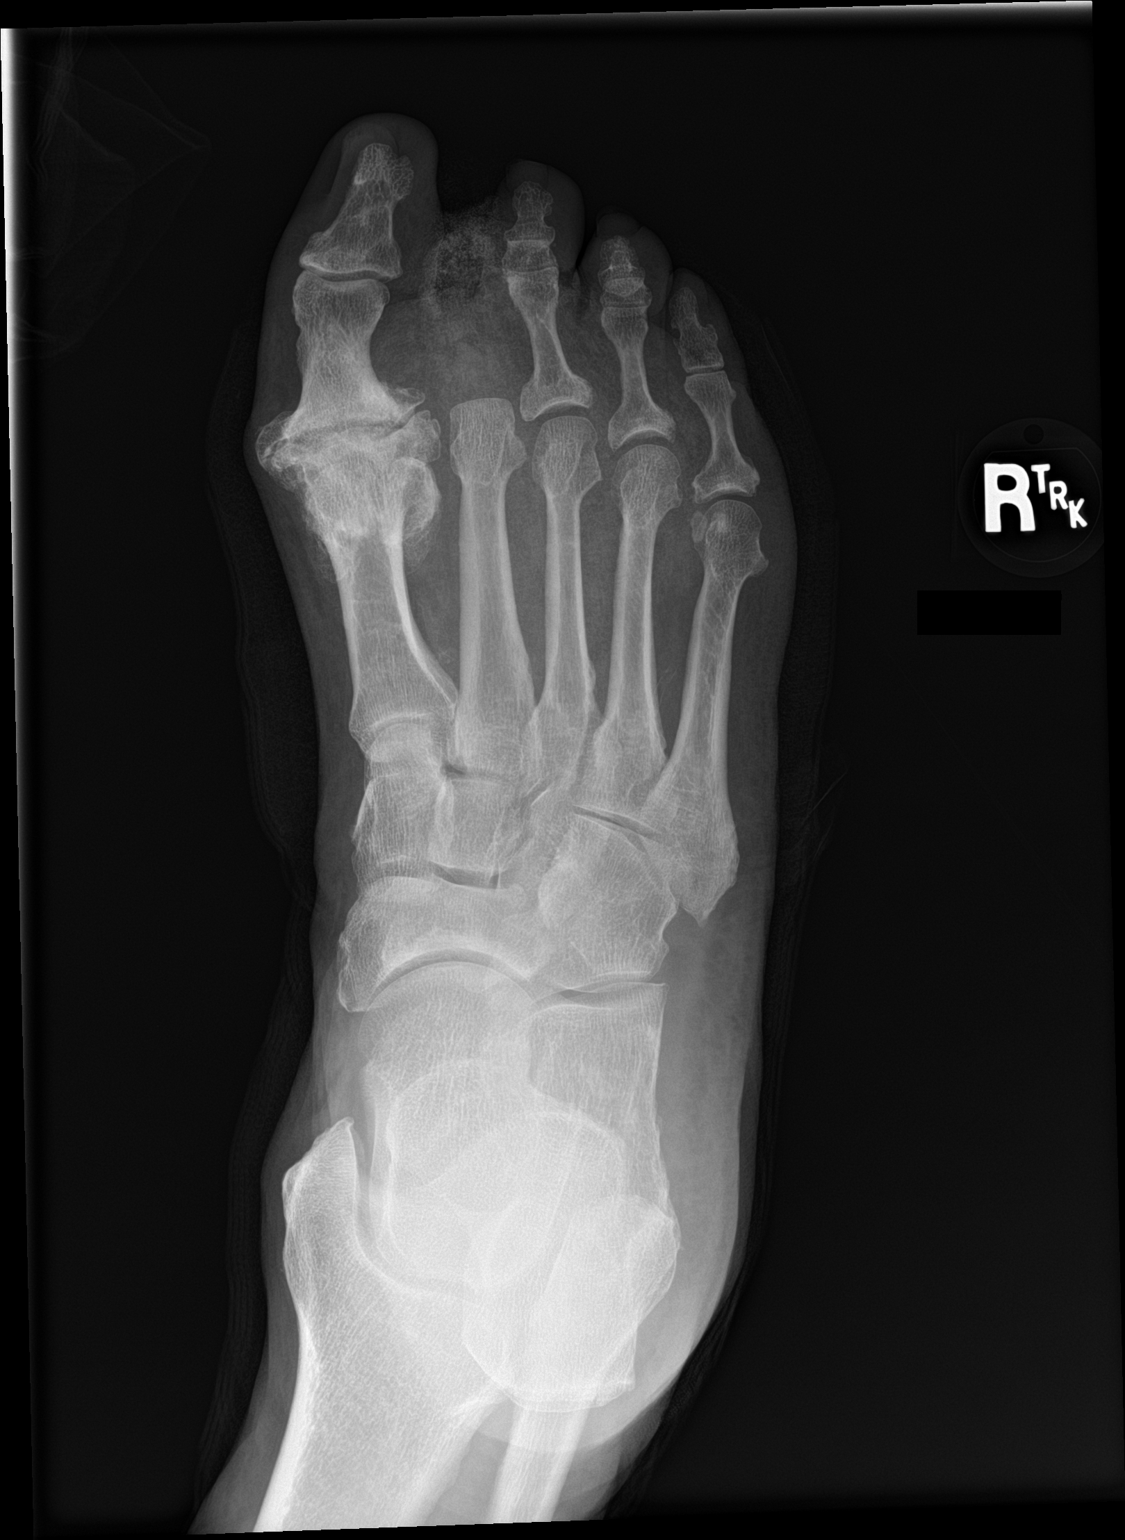

[foot lat]
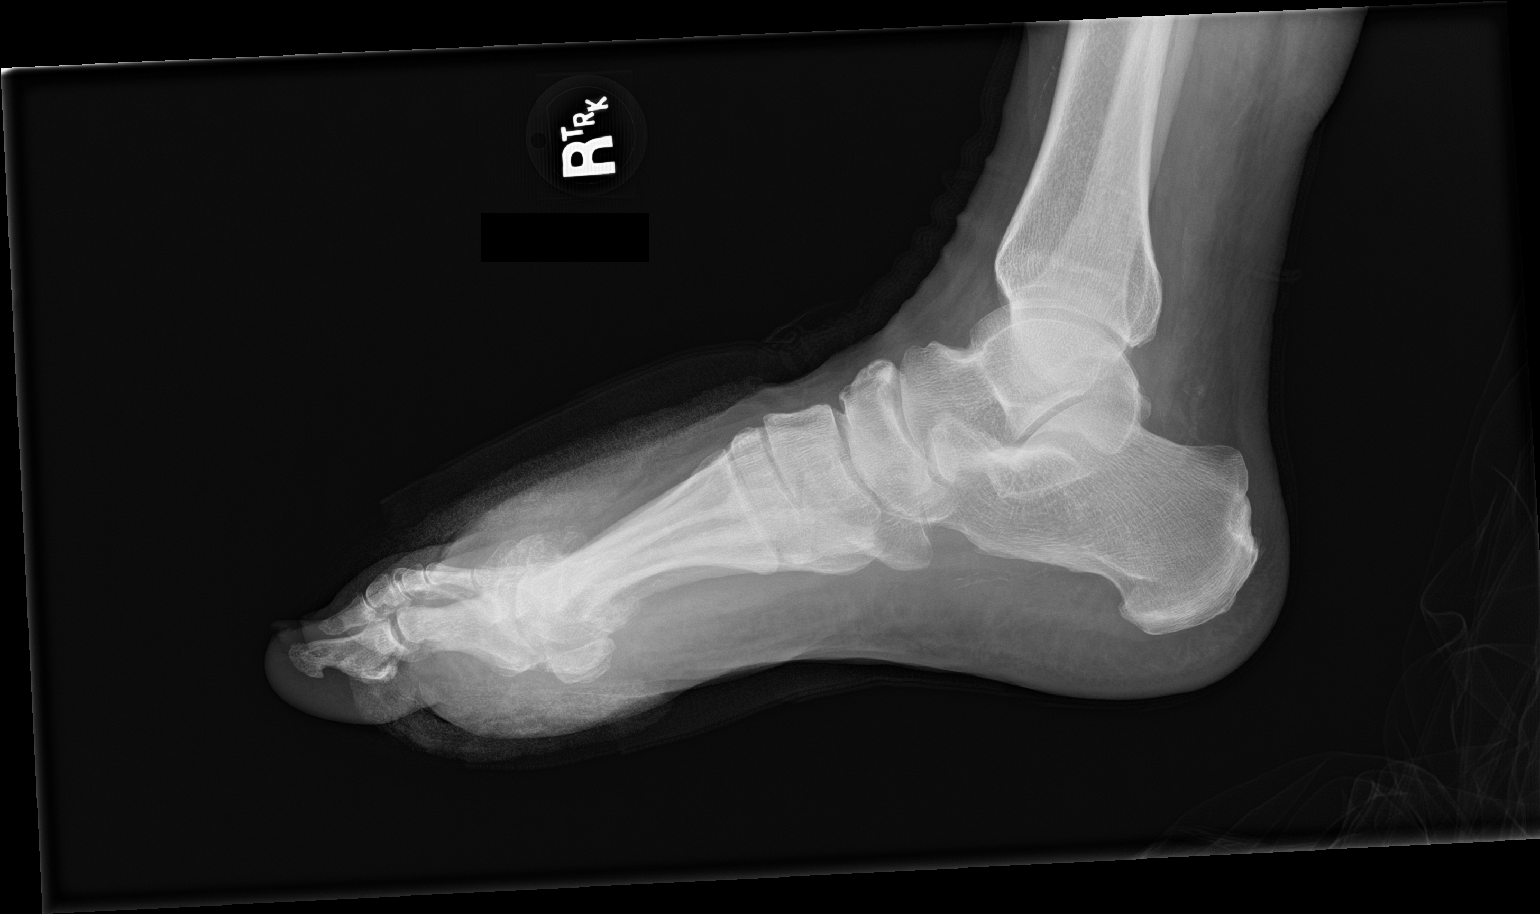

[3 of 3 positions shown; findings below may reference images not displayed]

FINDINGS: Postsurgical changes are noted consistent with the given clinical
history of second digit amputation. Degenerative changes are again
seen at the first MTP joint stable from the prior exam. No new focal
abnormality is noted.
IMPRESSION: Postsurgical changes and degenerative changes as described.

## 2019-09-15 SURGERY — AMPUTATION DIGIT
Anesthesia: General | Site: Foot | Laterality: Right

## 2019-09-15 MED ORDER — FENTANYL CITRATE (PF) 100 MCG/2ML IJ SOLN
INTRAMUSCULAR | Status: DC | PRN
  Administered 2019-09-15: 25 ug via INTRAVENOUS
  Administered 2019-09-15: 50 ug via INTRAVENOUS
  Administered 2019-09-15: 25 ug via INTRAVENOUS

## 2019-09-15 MED ORDER — LIDOCAINE 2% (20 MG/ML) 5 ML SYRINGE
INTRAMUSCULAR | Status: DC | PRN
  Administered 2019-09-15: 80 mg via INTRAVENOUS

## 2019-09-15 MED ORDER — DOXYCYCLINE HYCLATE 100 MG PO TABS
100.0000 mg | ORAL_TABLET | Freq: Two times a day (BID) | ORAL | Status: DC
  Administered 2019-09-15 – 2019-09-16 (×3): 100 mg via ORAL
  Filled 2019-09-15 (×3): qty 1

## 2019-09-15 MED ORDER — ONDANSETRON HCL 4 MG/2ML IJ SOLN
INTRAMUSCULAR | Status: DC | PRN
  Administered 2019-09-15: 4 mg via INTRAVENOUS

## 2019-09-15 MED ORDER — 0.9 % SODIUM CHLORIDE (POUR BTL) OPTIME
TOPICAL | Status: DC | PRN
  Administered 2019-09-15: 1000 mL

## 2019-09-15 MED ORDER — PHENYLEPHRINE HCL-NACL 10-0.9 MG/250ML-% IV SOLN
INTRAVENOUS | Status: DC | PRN
  Administered 2019-09-15: 40 ug/min via INTRAVENOUS

## 2019-09-15 MED ORDER — PROPOFOL 10 MG/ML IV BOLUS
INTRAVENOUS | Status: DC | PRN
  Administered 2019-09-15: 120 mg via INTRAVENOUS

## 2019-09-15 MED ORDER — MIDAZOLAM HCL 5 MG/5ML IJ SOLN
INTRAMUSCULAR | Status: DC | PRN
  Administered 2019-09-15: 1 mg via INTRAVENOUS

## 2019-09-15 MED ORDER — ONDANSETRON HCL 4 MG/2ML IJ SOLN: 4.0000 mg | Freq: Once | INTRAMUSCULAR | Status: DC | PRN

## 2019-09-15 MED ORDER — FENTANYL CITRATE (PF) 100 MCG/2ML IJ SOLN: 25.0000 ug | INTRAMUSCULAR | Status: DC | PRN

## 2019-09-15 MED ORDER — PHENYLEPHRINE HCL (PRESSORS) 10 MG/ML IV SOLN
INTRAVENOUS | Status: DC | PRN
Start: 2019-09-15 — End: 2019-09-15
  Administered 2019-09-15: 80 ug via INTRAVENOUS
  Administered 2019-09-15 (×2): 120 ug via INTRAVENOUS
  Administered 2019-09-15: 160 ug via INTRAVENOUS

## 2019-09-15 SURGICAL SUPPLY — 36 items
BLADE LONG MED 31MMX9MM (MISCELLANEOUS)
BLADE LONG MED 31X9 (MISCELLANEOUS) IMPLANT
BNDG CONFORM 2 STRL LF (GAUZE/BANDAGES/DRESSINGS) ×3 IMPLANT
BNDG ELASTIC 3X5.8 VLCR STR LF (GAUZE/BANDAGES/DRESSINGS) ×3 IMPLANT
BNDG ELASTIC 4X5.8 VLCR STR LF (GAUZE/BANDAGES/DRESSINGS) ×3 IMPLANT
BNDG ESMARK 4X9 LF (GAUZE/BANDAGES/DRESSINGS) ×3 IMPLANT
BNDG GAUZE ELAST 4 BULKY (GAUZE/BANDAGES/DRESSINGS) ×3 IMPLANT
CNTNR URN SCR LID CUP LEK RST (MISCELLANEOUS) ×1 IMPLANT
CONT SPEC 4OZ STRL OR WHT (MISCELLANEOUS) ×2
COVER WAND RF STERILE (DRAPES) ×3 IMPLANT
CUFF TOURN SGL QUICK 18X4 (TOURNIQUET CUFF) IMPLANT
DRSG EMULSION OIL 3X3 NADH (GAUZE/BANDAGES/DRESSINGS) ×3 IMPLANT
DURAPREP 26ML APPLICATOR (WOUND CARE) ×3 IMPLANT
ELECT REM PT RETURN 9FT ADLT (ELECTROSURGICAL) ×3
ELECTRODE REM PT RTRN 9FT ADLT (ELECTROSURGICAL) ×1 IMPLANT
GAUZE SPONGE 4X4 12PLY STRL (GAUZE/BANDAGES/DRESSINGS) ×3 IMPLANT
GAUZE SPONGE 4X4 12PLY STRL LF (GAUZE/BANDAGES/DRESSINGS) ×3 IMPLANT
GLOVE BIO SURGEON STRL SZ8 (GLOVE) ×6 IMPLANT
GOWN STRL REUS W/ TWL LRG LVL3 (GOWN DISPOSABLE) ×1 IMPLANT
GOWN STRL REUS W/ TWL XL LVL3 (GOWN DISPOSABLE) ×1 IMPLANT
GOWN STRL REUS W/TWL LRG LVL3 (GOWN DISPOSABLE) ×2
GOWN STRL REUS W/TWL XL LVL3 (GOWN DISPOSABLE) ×2
KIT BASIN OR (CUSTOM PROCEDURE TRAY) ×3 IMPLANT
NEEDLE 18GX1X1/2 (RX/OR ONLY) (NEEDLE) IMPLANT
NEEDLE HYPO 25X1 1.5 SAFETY (NEEDLE) IMPLANT
NS IRRIG 1000ML POUR BTL (IV SOLUTION) IMPLANT
PACK ORTHO EXTREMITY (CUSTOM PROCEDURE TRAY) IMPLANT
PADDING CAST ABS 4INX4YD NS (CAST SUPPLIES) ×2
PADDING CAST ABS COTTON 4X4 ST (CAST SUPPLIES) ×1 IMPLANT
SUT MNCRL AB 3-0 PS2 18 (SUTURE) IMPLANT
SUT MNCRL AB 4-0 PS2 18 (SUTURE) IMPLANT
SUT MON AB 5-0 PS2 18 (SUTURE) ×3 IMPLANT
SUT PROLENE 3 0 PS 2 (SUTURE) ×9 IMPLANT
SUT PROLENE 4 0 PS 2 18 (SUTURE) ×3 IMPLANT
SYR 10ML LL (SYRINGE) IMPLANT
UNDERPAD 30X36 HEAVY ABSORB (UNDERPADS AND DIAPERS) ×3 IMPLANT

## 2019-09-15 NOTE — Anesthesia Postprocedure Evaluation (Signed)
Anesthesia Post Note  Patient: Christopher Escobar  Procedure(s) Performed: AMPUTATION RIGHT FOOT 2ND DIGIT (Right Foot)     Patient location during evaluation: PACU Anesthesia Type: General Level of consciousness: awake and alert, oriented and awake Pain management: pain level controlled Vital Signs Assessment: post-procedure vital signs reviewed and stable Respiratory status: spontaneous breathing, nonlabored ventilation and respiratory function stable Cardiovascular status: blood pressure returned to baseline and stable Postop Assessment: no apparent nausea or vomiting Anesthetic complications: no   No complications documented.  Last Vitals:  Vitals:   09/15/19 0834 09/15/19 0903  BP: 111/78 110/71  Pulse: 84 83  Resp: 19 17  Temp: 36.7 C 37 C  SpO2: 94% 93%    Last Pain:  Vitals:   09/15/19 0903  TempSrc: Oral  PainSc:                  Catalina Gravel

## 2019-09-15 NOTE — Interval H&P Note (Signed)
History and Physical Interval Note:  09/15/2019 7:03 AM  Christopher Escobar  has presented today for surgery, with the diagnosis of RIGHT 2ND DIGIT OSTEOMYELITIS.  The various methods of treatment have been discussed with the patient and family. After consideration of risks, benefits and other options for treatment, the patient has consented to  Procedure(s): AMPUTATION RIGHT 2ND DIGIT (Right) as a surgical intervention.  The patient's history has been reviewed, patient examined, no change in status, stable for surgery.  I have reviewed the patient's chart and labs.  Questions were answered to the patient's satisfaction.     Felipa Furnace

## 2019-09-15 NOTE — Plan of Care (Signed)

## 2019-09-15 NOTE — H&P (Signed)
Anesthesia H&P Update: History and Physical Exam reviewed; patient is OK for planned anesthetic and procedure. ? ?

## 2019-09-15 NOTE — Transfer of Care (Signed)
Immediate Anesthesia Transfer of Care Note  Patient: Christopher Escobar  Procedure(s) Performed: AMPUTATION RIGHT FOOT 2ND DIGIT (Right Foot)  Patient Location: PACU  Anesthesia Type:General  Level of Consciousness: awake, alert  and oriented  Airway & Oxygen Therapy: Patient Spontanous Breathing  Post-op Assessment: Report given to RN, Post -op Vital signs reviewed and stable and Patient moving all extremities  Post vital signs: Reviewed and stable  Last Vitals:  Vitals Value Taken Time  BP 59/34 09/15/19 0812  Temp    Pulse 86 09/15/19 0813  Resp 25 09/15/19 0813  SpO2 95 % 09/15/19 0813  Vitals shown include unvalidated device data.  Last Pain:  Vitals:   09/15/19 0530  TempSrc: Oral  PainSc:          Complications: No complications documented.

## 2019-09-15 NOTE — Evaluation (Signed)
Physical Therapy Evaluation Patient Details Name: Christopher Escobar MRN: [id number] DOB: May 03, 1955 Today's Date: 09/15/2019   History of Present Illness  Pt is a 64 y/o male admitted secondary to R 2nd toe infection and is now s/p 2nd toe amputation on the R. PMH includes DM, HTN, sleep apena, and tubular adenoma.   Clinical Impression  Pt admitted secondary to problem above with deficits below. Pt requiring min guard for ambulation using RW. Tolerated well and was able to maintain heel weightbearing on the R. Anticipate pt will progress well and not require any PT follow up. Will continue to follow acutely to maximize functional mobility independence and safety.     Follow Up Recommendations No PT follow up;Supervision for mobility/OOB    Equipment Recommendations  Rolling walker with 5" wheels    Recommendations for Other Services       Precautions / Restrictions Precautions Precautions: Fall Required Braces or Orthoses: Other Brace Other Brace: post op shoe Restrictions Weight Bearing Restrictions: Yes RLE Weight Bearing: Partial weight bearing RLE Partial Weight Bearing Percentage or Pounds: Weightbearing on heel in post op shoe.       Mobility  Bed Mobility Overal bed mobility: Needs Assistance Bed Mobility: Supine to Sit     Supine to sit: Supervision     General bed mobility comments: Supervision for safety.   Transfers Overall transfer level: Needs assistance Equipment used: Rolling walker (2 wheeled) Transfers: Sit to/from Stand Sit to Stand: Min guard;From elevated surface         General transfer comment: Min guard for safety. Cues to maintain weightbearing on heel   Ambulation/Gait Ambulation/Gait assistance: Min guard Gait Distance (Feet): 100 Feet Assistive device: Rolling walker (2 wheeled) Gait Pattern/deviations: Step-to pattern;Decreased step length - right;Decreased step length - left;Decreased weight shift to right Gait velocity:  Decreased   General Gait Details: Step to pattern in order to maintain appropriate weightbearing. Able to maintain appropriate weightbearing.   Stairs            Wheelchair Mobility    Modified Rankin (Stroke Patients Only)       Balance Overall balance assessment: Needs assistance Sitting-balance support: No upper extremity supported;Feet supported Sitting balance-Leahy Scale: Good     Standing balance support: Bilateral upper extremity supported;During functional activity Standing balance-Leahy Scale: Poor Standing balance comment: Reliant on BUE support                              Pertinent Vitals/Pain Pain Assessment: 0-10 Pain Score: 4  Pain Location: R foot  Pain Descriptors / Indicators: Operative site guarding;Aching Pain Intervention(s): Limited activity within patient's tolerance;Monitored during session;Repositioned    Home Living Family/patient expects to be discharged to:: Private residence Living Arrangements: Spouse/significant other Available Help at Discharge: Family Type of Home: House Home Access: Stairs to enter Entrance Stairs-Rails: Right;Left;Can reach both Technical brewer of Steps: 3 Home Layout: One level Home Equipment: Crutches;Cane - single point;Walker - 4 wheels      Prior Function Level of Independence: Independent               Hand Dominance        Extremity/Trunk Assessment   Upper Extremity Assessment Upper Extremity Assessment: Overall WFL for tasks assessed    Lower Extremity Assessment Lower Extremity Assessment: RLE deficits/detail RLE Deficits / Details: deficits consistent with post op pain and weakness.     Cervical / Trunk Assessment Cervical /  Trunk Assessment: Normal  Communication   Communication: No difficulties  Cognition Arousal/Alertness: Awake/alert Behavior During Therapy: WFL for tasks assessed/performed Overall Cognitive Status: Within Functional Limits for tasks  assessed                                        General Comments General comments (skin integrity, edema, etc.): Pt's wife present in room     Exercises     Assessment/Plan    PT Assessment Patient needs continued PT services  PT Problem List Decreased strength;Decreased activity tolerance;Decreased balance;Decreased mobility;Decreased knowledge of use of DME;Pain       PT Treatment Interventions Gait training;DME instruction;Stair training;Functional mobility training;Therapeutic activities;Therapeutic exercise;Balance training;Patient/family education    PT Goals (Current goals can be found in the Care Plan section)  Acute Rehab PT Goals Patient Stated Goal: to go home PT Goal Formulation: With patient Time For Goal Achievement: 09/29/19 Potential to Achieve Goals: Good    Frequency Min 3X/week   Barriers to discharge        Co-evaluation               AM-PAC PT "6 Clicks" Mobility  Outcome Measure Help needed turning from your back to your side while in a flat bed without using bedrails?: None Help needed moving from lying on your back to sitting on the side of a flat bed without using bedrails?: None Help needed moving to and from a bed to a chair (including a wheelchair)?: A Little Help needed standing up from a chair using your arms (e.g., wheelchair or bedside chair)?: A Little Help needed to walk in hospital room?: A Little Help needed climbing 3-5 steps with a railing? : A Little 6 Click Score: 20    End of Session Equipment Utilized During Treatment: Gait belt Activity Tolerance: Patient tolerated treatment well Patient left: in chair;with call bell/phone within reach;with family/visitor present Nurse Communication: Mobility status PT Visit Diagnosis: Unsteadiness on feet (R26.81);Pain Pain - Right/Left: Right Pain - part of body: Ankle and joints of foot    Time: 5681-2751 PT Time Calculation (min) (ACUTE ONLY): 17  min   Charges:   PT Evaluation $PT Eval Low Complexity: 1 Low          Lou Miner, DPT  Acute Rehabilitation Services  Pager: [phone] Office: [phone]   Rudean Hitt 09/15/2019, 11:29 AM

## 2019-09-15 NOTE — Op Note (Signed)
Surgeon: Surgeon(s): Felipa Furnace, DPM  Assistants: None Pre-operative diagnosis: RIGHT 2ND DIGIT OSTEOMYELITIS  Post-operative diagnosis: same Procedure: Procedure(s) (LRB): AMPUTATION RIGHT FOOT 2ND DIGIT (Right)  Pathology:  ID Type Source Tests Collected by Time Destination  1 : right foot second toe Amputation Toe, Right SURGICAL PATHOLOGY Felipa Furnace, DPM 09/15/2019 0744     Pertinent Intra-op findings: Osteomyelitic changes noted with soft friable bone to the distal phalanx of middle phalanx of the second digit right foot.  Hard indurated bone noted of the second metatarsal head.  No signs of infection* Anesthesia: General  Hemostasis: None EBL: Minimal Materials: 3-0 Prolene Injectables: None Complications: None  Indications for surgery: A 64 y.o. male presents with right second digit diabetic foot ulcer with underlying osteomyelitis and cellulitis. Patient has failed all conservative therapy including but not limited to local wound care and p.o. antibiotics. He wishes to have surgical correction of the foot/deformity. It was determined that patient would benefit from right foot second digit amputation at the level of the metatarsophalangeal joint. Informed surgical risk consent was reviewed and read aloud to the patient.  I reviewed the films.  I have discussed my findings with the patient in great detail.  I have discussed all risks including but not limited to infection, stiffness, scarring, limp, disability, deformity, damage to blood vessels and nerves, numbness, poor healing, need for braces, arthritis, chronic pain, amputation, death.  All benefits and realistic expectations discussed in great detail.  I have made no promises as to the outcome.  I have provided realistic expectations.  I have offered the patient a 2nd opinion, which they have declined and assured me they preferred to proceed despite the risks   Procedure in detail: The patient was both verbally and  visually identified by myself, the nursing staff, and anesthesia staff in the preoperative holding area. They were then transferred to the operating room and placed on the operative table in supine position.  Attention was directed to the right right foot second digit, skin skin marker was used to delineate and racquet shaped incision circumferentially around the digit.  A #15 blade was used to carry the incision through the skin down to the level of the bone.  Some purulent drainage was expressed at this point.  The digit was disarticulated at the level of the metatarsophalangeal joint of the second digit.  The digit was sent to pathology in standard technique.  The wound appeared clear of infection.  The wound was thoroughly irrigated with pulse lavage with 3 L saline solution.  The wound was thoroughly inspected.  No purulent drainage was expressed.  The wound appeared clear of infection.  At this point it was determined that patient will benefit from primary closure of the wound.  Using 3-0 Prolene simple interrupted suture technique was used to reapproximate the skin.  The foot was dressed with Betadine wet-to-dry dressing.  Patient will be partial weightbearing to the heel with a surgical shoe or Darco wedge shoe.  At the conclusion of the procedure the patient was awoken from anesthesia and found to have tolerated the procedure well any complications. There were transferred to PACU with vital signs stable and vascular status intact.  Boneta Lucks, DPM

## 2019-09-15 NOTE — Progress Notes (Signed)
PROGRESS NOTE    Christopher Escobar  UKG:[id number] DOB: 1955-09-09 DOA: 09/13/2019 PCP: Albina Billet, MD    Brief Narrative:  64 y.o. male with medical history significant of hypertension, hyperlipidemia, diabetes mellitus, morbid obesity, sleep apnea (noncompliant with CPAP), tubular adenoma presents to emergency department due to right foot infection.  Patient tells me that he was seen by his podiatrist this morning and due to worsening right second toe infection he was advised to go to ER for IV antibiotics.  Reports that he has wound in right second toe since 3 to 4 weeks and has been getting worse associated with purulent drainage.  He also has left midfoot infection which is getting better.  He was prescribed doxycycline with no improvement.  He reports mild pain of 4 out of 10 however denies association with fever, chills, nausea, vomiting, decreased appetite, weakness, lethargy.  No history of headache, blurry vision, chest pain, shortness of breath, palpitation, leg swelling, urinary or bowel changes.  He lives with his wife at home.  No history of smoking, alcohol, illicit drug use.  ED Course: Upon arrival to ED patient's vital signs stable, afebrile with leukocytosis of 14.6, lactic acid: 2.3, CMP shows sodium of 133, BUN: 24, creatinine: 1.27, GFR: 58, COVID-19 pending, chest x-ray shows low lung volume and mild bibasilar atelectasis.  X-ray of right foot is pending.  Patient was given IV Zosyn and vancomycin in ED.  [facility] hospitalist consulted for admission for right foot infection.  Assessment & Plan:   Principal Problem:   Right foot infection Active Problems:   Diabetes mellitus type 2, uncomplicated (HCC)   Essential hypertension   HLD (hyperlipidemia)   AKI (acute kidney injury) ([place])   Hyponatremia  Diabetic foot ulcer: In right second toe -Patient presented with pain, worsening of ulcer and right second toe associated with purulent discharge.  He is afebrile  with leukocytosis of 14.6, lactic acid elevated at 1.3.  COVID-19 pending.  Reviewed chest x-ray. -Patient was given IV Zosyn and vancomycin in ED. -Blood cx neg thus far -Pt underwent amputation of 2nd R digit on 6/17. Discussed with Podiatry. Recommendation to change abx to doxycycline to complete 2 weeks of tx -Per Podiatry, possible d/c 6/18 if stable -PT consulted, no PT f/u recommended  AKI: -BUN: 24, creatinine: 1.27, GFR: 58 (baseline creatinine: 0.83, GFR: More than 60) -Start on maintenance IV fluid.  Avoid nephrotoxic medication.  -resolved  Hyponatremia: Sodium of 133 -Continue IV fluids.   -sodium levels improved  Hypertension: Stable -Continue metoprolol, hold lisinopril-HCTZ due to AKI. -Hydralazine as needed -cont to titrate bp meds accordingly  Diabetes mellitus: Uncontrolled. A1c today is 10.9% -Continue Levemir 50 units at bedtime. Started on sliding scale insulin and monitor blood sugar closely.   -On 4 units meal coverage per Diabetic Coordinator recs  Hypomagnesemia: Replenished -electrolytes improved  Diabetic neuropathy: Continue Lyrica as tolerated  Hyperlipidemia: Continue statin  Tubular adenoma: Had colonoscopy in 2018 -Reviewed biopsy results from 2018 which shows-tubular adenoma in ascending colon-negative for high-grade dysplasia and malignancy. -No history of melena. -Recommend follow-up with GI outpatient.  History of obstructive sleep apnea- noted to be noncompliant with CPAP   DVT prophylaxis: Lovenox subq Code Status: Full Family Communication: Pt in room, family not at bedside  Status is: Inpatient  Remains inpatient appropriate because:Unsafe d/c plan   Dispo: The patient is from: Home              Anticipated d/c is to: Home  Anticipated d/c date is: 1 day              Patient currently is not medically stable to d/c.   Consultants:   Podiatry  Procedures:   Amputation of 2nd R digit  6/17  Antimicrobials: Anti-infectives (From admission, onward)   Start     Dose/Rate Route Frequency Ordered Stop   09/15/19 1000  doxycycline (VIBRA-TABS) tablet 100 mg     Discontinue     100 mg Oral Every 12 hours 09/15/19 0941 09/29/19 0959   09/14/19 2200  ceFEPIme (MAXIPIME) 2 g in sodium chloride 0.9 % 100 mL IVPB  Status:  Discontinued        2 g 200 mL/hr over 30 Minutes Intravenous Every 8 hours 09/14/19 1745 09/15/19 0941   09/14/19 1030  vancomycin (VANCOCIN) IVPB 1000 mg/200 mL premix  Status:  Discontinued        1,000 mg 200 mL/hr over 60 Minutes Intravenous Every 12 hours 09/14/19 1008 09/15/19 0941   09/13/19 2200  piperacillin-tazobactam (ZOSYN) IVPB 3.375 g  Status:  Discontinued        3.375 g 12.5 mL/hr over 240 Minutes Intravenous Every 8 hours 09/13/19 1356 09/13/19 1404   09/13/19 1930  piperacillin-tazobactam (ZOSYN) IVPB 3.375 g  Status:  Discontinued        3.375 g 12.5 mL/hr over 240 Minutes Intravenous Every 8 hours 09/13/19 1404 09/14/19 1742   09/13/19 1500  vancomycin (VANCOREADY) IVPB 2000 mg/400 mL        2,000 mg 200 mL/hr over 120 Minutes Intravenous  Once 09/13/19 1352 09/13/19 1816   09/13/19 1356  vancomycin variable dose per unstable renal function (pharmacist dosing)  Status:  Discontinued         Does not apply See admin instructions 09/13/19 1356 09/14/19 1008   09/13/19 1245  piperacillin-tazobactam (ZOSYN) IVPB 3.375 g        3.375 g 100 mL/hr over 30 Minutes Intravenous  Once 09/13/19 1255 09/13/19 1403   09/13/19 1245  vancomycin (VANCOCIN) IVPB 1000 mg/200 mL premix  Status:  Discontinued        1,000 mg 200 mL/hr over 60 Minutes Intravenous  Once 09/13/19 1255 09/13/19 1351      Subjective: No complaints following surgery today  Objective: Vitals:   09/15/19 0830 09/15/19 0834 09/15/19 0903 09/15/19 1524  BP:  111/78 110/71 110/78  Pulse: 85 84 83 87  Resp: 16 19 17 17   Temp:  98 F (36.7 C) 98.6 F (37 C) (!) 97.4 F (36.3  C)  TempSrc:   Oral Oral  SpO2: 96% 94% 93% 92%  Weight:      Height:        Intake/Output Summary (Last 24 hours) at 09/15/2019 1805 Last data filed at 09/15/2019 1500 Gross per 24 hour  Intake 2643.07 ml  Output 14 ml  Net 2629.07 ml   Filed Weights   09/13/19 1305  Weight: 121.7 kg    Examination: General exam: Awake, laying in bed, sitting in chair Respiratory system: Normal respiratory effort, no wheezing Cardiovascular system: regular rate, s1, s2 Gastrointestinal system: Soft, nondistended, positive BS Central nervous system: CN2-12 grossly intact, strength intact Extremities: Perfused, no clubbing, R foot with post-op dressings Skin: Normal skin turgor, no notable skin lesions seen Psychiatry: Mood normal // no visual hallucinations   Data Reviewed: I have personally reviewed following labs and imaging studies  CBC: Recent Labs  Lab 09/13/19 1057 09/13/19 1330 09/14/19 0840 09/15/19 0509  WBC 14.6* 14.7* 13.8* 12.7*  NEUTROABS 9.5*  --   --   --   HGB 13.6 13.0 12.7* 12.2*  HCT 42.2 40.0 38.3* 36.6*  MCV 90.8 91.1 89.3 88.6  PLT 279 269 251 606   Basic Metabolic Panel: Recent Labs  Lab 09/13/19 1057 09/13/19 1330 09/14/19 0840 09/15/19 0509  NA 133*  --  132* 134*  K 4.1  --  3.8 3.8  CL 99  --  100 105  CO2 20*  --  22 21*  GLUCOSE 431*  --  216* 187*  BUN 24*  --  22 18  CREATININE 1.27* 1.21 1.13 0.99  CALCIUM 9.3  --  8.5* 8.2*  MG  --  1.4* 1.5* 1.8  PHOS  --  3.7  --   --    GFR: Estimated Creatinine Clearance: 97.5 mL/min (by C-G formula based on SCr of 0.99 mg/dL). Liver Function Tests: Recent Labs  Lab 09/13/19 1057 09/14/19 0840 09/15/19 0509  AST 11* 12* 11*  ALT 11 10 9   ALKPHOS 69 60 55  BILITOT 0.8 1.2 0.9  PROT 7.3 6.8 6.4*  ALBUMIN 2.9* 2.6* 2.3*   No results for input(s): LIPASE, AMYLASE in the last 168 hours. No results for input(s): AMMONIA in the last 168 hours. Coagulation Profile: No results for input(s):  INR, PROTIME in the last 168 hours. Cardiac Enzymes: No results for input(s): CKTOTAL, CKMB, CKMBINDEX, TROPONINI in the last 168 hours. BNP (last 3 results) No results for input(s): PROBNP in the last 8760 hours. HbA1C: Recent Labs    09/13/19 1330  HGBA1C 10.7*   CBG: Recent Labs  Lab 09/15/19 0001 09/15/19 0529 09/15/19 0816 09/15/19 1153 09/15/19 1643  GLUCAP 189* 170* 151* 215* 210*   Lipid Profile: No results for input(s): CHOL, HDL, LDLCALC, TRIG, CHOLHDL, LDLDIRECT in the last 72 hours. Thyroid Function Tests: No results for input(s): TSH, T4TOTAL, FREET4, T3FREE, THYROIDAB in the last 72 hours. Anemia Panel: No results for input(s): VITAMINB12, FOLATE, FERRITIN, TIBC, IRON, RETICCTPCT in the last 72 hours. Sepsis Labs: Recent Labs  Lab 09/13/19 1057 09/13/19 1330  LATICACIDVEN 2.3* 2.2*    Recent Results (from the past 240 hour(s))  SARS Coronavirus 2 by RT PCR (hospital order, performed in [facility] hospital lab) Nasopharyngeal Nasopharyngeal Swab     Status: None   Collection Time: 09/13/19  1:07 PM   Specimen: Nasopharyngeal Swab  Result Value Ref Range Status   SARS Coronavirus 2 NEGATIVE NEGATIVE Final    Comment: (NOTE) SARS-CoV-2 target nucleic acids are NOT DETECTED.  The SARS-CoV-2 RNA is generally detectable in upper and lower respiratory specimens during the acute phase of infection. The lowest concentration of SARS-CoV-2 viral copies this assay can detect is 250 copies / mL. A negative result does not preclude SARS-CoV-2 infection and should not be used as the sole basis for treatment or other patient management decisions.  A negative result may occur with improper specimen collection / handling, submission of specimen other than nasopharyngeal swab, presence of viral mutation(s) within the areas targeted by this assay, and inadequate number of viral copies (<250 copies / mL). A negative result must be combined with clinical observations,  patient history, and epidemiological information.  Fact Sheet for Patients:   StrictlyIdeas.no  Fact Sheet for Healthcare Providers: BankingDealers.co.za  This test is not yet approved or  cleared by the [place] FDA and has been authorized for detection and/or diagnosis of SARS-CoV-2 by FDA under an Emergency  Use Authorization (EUA).  This EUA will remain in effect (meaning this test can be used) for the duration of the COVID-19 declaration under Section 564(b)(1) of the Act, 21 U.S.C. section 360bbb-3(b)(1), unless the authorization is terminated or revoked sooner.  Performed at [facility], [place] [address], [place], [place] [address]   Culture, blood (routine x 2)     Status: None (Preliminary result)   Collection Time: 09/13/19  1:15 PM   Specimen: BLOOD LEFT HAND  Result Value Ref Range Status   Specimen Description BLOOD LEFT HAND  Final   Special Requests   Final    BOTTLES DRAWN AEROBIC AND ANAEROBIC Blood Culture adequate volume   Culture   Final    NO GROWTH 2 DAYS Performed at [facility], [place] [address], [place], [place] [address]    Report Status PENDING  Incomplete  Culture, blood (routine x 2)     Status: None (Preliminary result)   Collection Time: 09/13/19  1:20 PM   Specimen: BLOOD  Result Value Ref Range Status   Specimen Description BLOOD RIGHT ANTECUBITAL  Final   Special Requests   Final    BOTTLES DRAWN AEROBIC AND ANAEROBIC Blood Culture adequate volume   Culture   Final    NO GROWTH 2 DAYS Performed at [facility], [place] [address] [place], [place] [address]    Report Status PENDING  Incomplete  Surgical pcr screen     Status: None   Collection Time: 09/14/19  8:06 AM   Specimen: Nasal Mucosa; Nasal Swab  Result Value Ref Range Status   MRSA, PCR NEGATIVE NEGATIVE Final   Staphylococcus aureus NEGATIVE NEGATIVE Final    Comment: (NOTE) The Xpert SA Assay (FDA approved for  NASAL specimens in patients 60 years of age and older), is one component of a comprehensive surveillance program. It is not intended to diagnose infection nor to guide or monitor treatment. Performed at [facility], [place] [address] [place], [place] [address]      Radiology Studies: DG Foot Complete Right  Result Date: 09/15/2019 CLINICAL DATA:  Status post second digit amputation EXAM: RIGHT FOOT COMPLETE - 3+ VIEW COMPARISON:  09/13/2019 FINDINGS: Postsurgical changes are noted consistent with the given clinical history of second digit amputation. Degenerative changes are again seen at the first MTP joint stable from the prior exam. No new focal abnormality is noted. IMPRESSION: Postsurgical changes and degenerative changes as described. Electronically Signed   By: Inez Catalina M.D.   On: 09/15/2019 08:58    Scheduled Meds: . atorvastatin  40 mg Oral q1800  . doxycycline  100 mg Oral Q12H  . enoxaparin (LOVENOX) injection  40 mg Subcutaneous Q24H  . insulin aspart  0-15 Units Subcutaneous TID WC  . insulin aspart  0-5 Units Subcutaneous QHS  . insulin aspart  4 Units Subcutaneous TID WC  . insulin detemir  50 Units Subcutaneous QHS  . metoprolol succinate  25 mg Oral [id number]  . pregabalin  75 mg Oral BID  . sodium chloride flush  3 mL Intravenous Once   Continuous Infusions:    LOS: 2 days   Marylu Lund, MD [facility] Pager On Amion  If 7PM-7AM, please contact night-coverage 09/15/2019, 6:05 PM

## 2019-09-15 NOTE — Anesthesia Procedure Notes (Signed)
Procedure Name: LMA Insertion Date/Time: 09/15/2019 7:35 AM Performed by: Amadeo Garnet, CRNA Pre-anesthesia Checklist: Patient identified, Emergency Drugs available, Suction available and Patient being monitored Patient Re-evaluated:Patient Re-evaluated prior to induction Oxygen Delivery Method: Circle system utilized Preoxygenation: Pre-oxygenation with 100% oxygen Induction Type: IV induction LMA: LMA inserted LMA Size: 5.0 Number of attempts: 1 Placement Confirmation: positive ETCO2 and breath sounds checked- equal and bilateral Tube secured with: Tape Dental Injury: Teeth and Oropharynx as per pre-operative assessment

## 2019-09-15 NOTE — Progress Notes (Signed)
Report is given to short stay.

## 2019-09-16 LAB — CBC
HCT: 38.1 % — ABNORMAL LOW (ref 39.0–52.0)
Hemoglobin: 12.7 g/dL — ABNORMAL LOW (ref 13.0–17.0)
MCH: 30 pg (ref 26.0–34.0)
MCHC: 33.3 g/dL (ref 30.0–36.0)
MCV: 90.1 fL (ref 80.0–100.0)
Platelets: 257 10*3/uL (ref 150–400)
RBC: 4.23 MIL/uL (ref 4.22–5.81)
RDW: 13.1 % (ref 11.5–15.5)
WBC: 9 10*3/uL (ref 4.0–10.5)
nRBC: 0 % (ref 0.0–0.2)

## 2019-09-16 LAB — GLUCOSE, CAPILLARY
Glucose-Capillary: 215 mg/dL — ABNORMAL HIGH (ref 70–99)
Glucose-Capillary: 169 mg/dL — ABNORMAL HIGH (ref 70–99)

## 2019-09-16 LAB — SURGICAL PATHOLOGY

## 2019-09-16 MED ORDER — DOXYCYCLINE HYCLATE 100 MG PO TABS: 100.0000 mg | ORAL_TABLET | Freq: Two times a day (BID) | ORAL | 0 refills | Status: AC

## 2019-09-16 MED FILL — DOXYCYCLINE HYCLATE 100 MG: 100 | 13 days supply | Qty: 26 | Fill #0

## 2019-09-16 NOTE — Progress Notes (Signed)
Physical Therapy Treatment Patient Details Name: Christopher Escobar MRN: [id number] DOB: May 05, 1955 Today's Date: 09/16/2019    History of Present Illness Pt is a 64 y/o male admitted secondary to R 2nd toe infection and is now s/p 2nd toe amputation on the R. PMH includes DM, HTN, sleep apena, and tubular adenoma.     PT Comments    Pt met his physical therapy goals during his inpatient stay. Ambulating limited hallway distances with a walker without physical assist; demonstrates good adherence to weightbearing precautions with post op shoe donned. Negotiated 2 steps with bilateral rails to simulate home set up. Education provided regarding daily foot inspections, signs/symptoms of infection, weightbearing precautions, appropriate DME, elevation, cryotherapy and activity restrictions. Pt verbalized understanding and has no further questions/concerns. PT signing off.    Follow Up Recommendations  No PT follow up;Supervision for mobility/OOB     Equipment Recommendations  Rolling walker with 5" wheels    Recommendations for Other Services       Precautions / Restrictions Precautions Precautions: Fall Required Braces or Orthoses: Other Brace Other Brace: bilateral post op shoe Restrictions Weight Bearing Restrictions: Yes RLE Weight Bearing: Partial weight bearing RLE Partial Weight Bearing Percentage or Pounds: Weightbearing on heel in post op shoe    Mobility  Bed Mobility Overal bed mobility: Independent                Transfers Overall transfer level: Modified independent Equipment used: Rolling walker (2 wheeled)                Ambulation/Gait Ambulation/Gait assistance: Modified independent (Device/Increase time) Gait Distance (Feet): 100 Feet Assistive device: Rolling walker (2 wheeled) Gait Pattern/deviations: Decreased weight shift to right;Step-through pattern Gait velocity: Decreased   General Gait Details: Pt modI with use of walker, no gross  unsteadiness noted. Maintaining weightbearing precautions well. Min cues for walker use and upright posture.   Stairs Stairs: Yes Stairs assistance: Modified independent (Device/Increase time) Stair Management: Two rails Number of Stairs: 2 General stair comments: Cues for sequencing, step by step pattern, weightbearing precautions.   Wheelchair Mobility    Modified Rankin (Stroke Patients Only)       Balance Overall balance assessment: Needs assistance Sitting-balance support: No upper extremity supported;Feet supported Sitting balance-Leahy Scale: Good     Standing balance support: During functional activity;No upper extremity supported Standing balance-Leahy Scale: Fair                              Cognition Arousal/Alertness: Awake/alert Behavior During Therapy: WFL for tasks assessed/performed Overall Cognitive Status: Within Functional Limits for tasks assessed                                        Exercises      General Comments        Pertinent Vitals/Pain Pain Assessment: Faces Faces Pain Scale: Hurts a little bit Pain Location: R foot  Pain Descriptors / Indicators: Operative site guarding;Aching Pain Intervention(s): Monitored during session    Home Living                      Prior Function            PT Goals (current goals can now be found in the care plan section) Acute Rehab PT Goals Patient Stated Goal: to  go home PT Goal Formulation: With patient Time For Goal Achievement: 09/29/19 Potential to Achieve Goals: Good Progress towards PT goals: Goals met/education completed, patient discharged from PT    Frequency    Min 3X/week      PT Plan Other (comment) (d/c therapies)    Co-evaluation              AM-PAC PT "6 Clicks" Mobility   Outcome Measure  Help needed turning from your back to your side while in a flat bed without using bedrails?: None Help needed moving from lying on  your back to sitting on the side of a flat bed without using bedrails?: None Help needed moving to and from a bed to a chair (including a wheelchair)?: None Help needed standing up from a chair using your arms (e.g., wheelchair or bedside chair)?: None Help needed to walk in hospital room?: None Help needed climbing 3-5 steps with a railing? : None 6 Click Score: 24    End of Session Equipment Utilized During Treatment: Gait belt Activity Tolerance: Patient tolerated treatment well Patient left: with call bell/phone within reach;in bed Nurse Communication: Mobility status PT Visit Diagnosis: Unsteadiness on feet (R26.81);Pain Pain - Right/Left: Right Pain - part of body: Ankle and joints of foot     Time: 2574-9355 PT Time Calculation (min) (ACUTE ONLY): 14 min  Charges:  $Gait Training: 8-22 mins                       Wyona Almas, PT, DPT Acute Rehabilitation Services Pager [phone] Office [phone]    [name] 09/16/2019, 9:14 AM

## 2019-09-16 NOTE — Discharge Summary (Signed)
Physician Discharge Summary  EJ PINSON TIW:[id number] DOB: 1956/02/05 DOA: 09/13/2019  PCP: Albina Billet, MD  Admit date: 09/13/2019 Discharge date: 09/16/2019  Admitted From: Home Disposition:  Home  Recommendations for Outpatient Follow-up:  1. Follow up with PCP in 2-3 weeks 2. Follow up with Podiatry as scheduled  Discharge Condition:Stable CODE STATUS:Full Diet recommendation: Diabetic   Brief/Interim Summary: 64 y.o.malewith medical history significant ofhypertension, hyperlipidemia, diabetes mellitus, morbid obesity, sleep apnea(noncompliant with CPAP), tubular adenoma presents to emergency department due to right foot infection.  Patient tells me that he was seen by his podiatrist this morning and due to worsening right second toe infection he was advised to go to ER for IV antibiotics. Reports that he has wound in right second toe since 3 to 4 weeks and has been getting worse associated with purulent drainage. He also has left midfoot infection which is getting better. He was prescribed doxycycline with no improvement. He reports mild pain of 4 out of 10 however denies association with fever, chills, nausea, vomiting, decreased appetite, weakness, lethargy.  No history of headache, blurry vision, chest pain, shortness of breath, palpitation, leg swelling, urinary or bowel changes.  He lives with his wife at home. No history of smoking, alcohol, illicit drug use.  ED Course:Upon arrival to ED patient's vital signs stable, afebrile with leukocytosis of 14.6, lactic acid: 2.3, CMP shows sodium of 133, BUN: 24, creatinine: 1.27, GFR: 58, COVID-19 pending, chest x-ray shows low lung volume and mild bibasilar atelectasis. X-ray of right foot is pending. Patient was given IV Zosyn and vancomycin in ED. [facility] hospital[facility] consulted for admission for right foot infection.  Discharge Diagnoses:  Principal Problem:   Right foot infection Active Problems:   Diabetes  mellitus type 2, uncomplicated (HCC)   Essential hypertension   HLD (hyperlipidemia)   AKI (acute kidney injury) ([place])   Hyponatremia  Diabetic foot ulcer: In right second toe -Patient presented with pain, worsening of ulcer and right second toe associated with purulent discharge. He is afebrile with leukocytosis of 14.6, lactic acid elevated at 1.3. COVID-19 pending. Reviewed chest x-ray. -Patient was given IV Zosyn and vancomycin in ED. -Blood cx neg thus far -Pt underwent amputation of 2nd R digit on 6/17. Discussed with Podiatry. Recommendation to change abx to doxycycline to complete 2 weeks of tx -PT consulted, no PT f/u recommended with recommendation for rolling walker on d/c -On d/w with Podiatry, OK for d/c on 6/18 if stable  AKI: -BUN: 24, creatinine: 1.27, GFR: 58(baseline creatinine: 0.83, GFR: More than 60) -Start on maintenance IV fluid. Avoid nephrotoxic medication.  -resolved  Hyponatremia: Sodium of 133 -Continue IV fluids.  -sodium levels improved  Hypertension: Stable -Continue metoprolol, held lisinopril-HCTZ due to AKI while in hospital -Hydralazine as needed while in hospital -cont meds on d/c  Diabetes mellitus:Uncontrolled. A1c today is 10.9% -Continue Levemir 50 units at bedtime.Started on sliding scale insulin and monitor blood sugar closely while in hospital -On 4 units meal coverage per Diabetic Coordinator recs while in hospital -Have strongly advised compliance with diabetic care and regimen on d/c Pt had admitted gross noncompliance prior to admit -Cont home meds on d/c  Hypomagnesemia: Replenished -electrolytes improved  Diabetic neuropathy: Continue Lyrica as tolerated  Hyperlipidemia: Continue statin  Tubular adenoma: Had colonoscopy in 2018 -Reviewed biopsy results from 2018 which shows-tubular adenoma in ascending colon-negative for high-grade dysplasia and malignancy. -No history of melena. -Recommend follow-up with GI  outpatient.  History of obstructive sleep apnea-  noted to be noncompliant with CPAP  Discharge Instructions  Discharge Instructions    Diet - low sodium heart healthy   Complete by: As directed    Leave dressing on - Keep it clean, dry, and intact until clinic visit   Complete by: As directed      Allergies as of 09/16/2019   No Known Allergies     Medication List    TAKE these medications   atorvastatin 40 MG tablet Commonly known as: LIPITOR Take 40 mg by mouth daily at 6 PM.   doxycycline 100 MG tablet Commonly known as: VIBRA-TABS Take 1 tablet (100 mg total) by mouth every 12 (twelve) hours for 13 days. What changed: when to take this   glimepiride 4 MG tablet Commonly known as: AMARYL Take 2 tablets (8 mg total) by mouth 2 (two) times daily. What changed: how much to take   insulin detemir 100 UNIT/ML injection Commonly known as: LEVEMIR Inject 50 Units into the skin at bedtime.   lisinopril-hydrochlorothiazide 20-12.5 MG tablet Commonly known as: ZESTORETIC Take 1 tablet every morning by mouth.   metoprolol succinate 25 MG 24 hr tablet Commonly known as: TOPROL-XL Take 25 mg every morning by mouth.   multivitamin-lutein Caps capsule Take 1 capsule by mouth 2 (two) times daily.   NovoLOG Mix 70/30 FlexPen (70-30) 100 UNIT/ML FlexPen Generic drug: insulin aspart protamine - aspart Inject 24 Units into the skin 2 (two) times daily with a meal.   oxyCODONE-acetaminophen 7.5-325 MG tablet Commonly known as: PERCOCET Take 1 tablet by mouth 5 (five) times daily.   pregabalin 75 MG capsule Commonly known as: LYRICA Take 75 mg by mouth 2 (two) times daily.            Durable Medical Equipment  (From admission, onward)         Start     Ordered   09/15/19 1126  For home use only DME Walker rolling  Once       Question Answer Comment  Walker: With 5 Inch Wheels   Patient needs a walker to treat with the following condition Right foot infection       09/15/19 1126           Discharge Care Instructions  (From admission, onward)         Start     Ordered   09/15/19 0000  Leave dressing on - Keep it clean, dry, and intact until clinic visit        09/15/19 0810          Follow-up Information    Albina Billet, MD. Schedule an appointment as soon as possible for a visit in 2 week(s).   Specialty: Internal Medicine Contact information: [address]   [place] [address] [phone]              No Known Allergies  Consultations:  Podiatry  Procedures/Studies: DG Chest 2 View  Result Date: 09/13/2019 CLINICAL DATA:  Toe infection. EXAM: CHEST - 2 VIEW COMPARISON:  06/16/2019. FINDINGS: Mediastinum and hilar structures normal. Prominent epicardial fat pads again. Heart size normal. Low lung volumes mild basilar atelectasis. Mild bibasilar atelectasis. No pleural effusion or pneumothorax. Degenerative change thoracic spine. IMPRESSION: Low lung volumes.  With mild basilar atelectasis. Electronically Signed   By: Marcello Moores  Register   On: 09/13/2019 11:19   DG Foot Complete Left  Result Date: 09/06/2019 Please see detailed radiograph report in office note.  DG Foot  Complete Right  Result Date: 09/15/2019 CLINICAL DATA:  Status post second digit amputation EXAM: RIGHT FOOT COMPLETE - 3+ VIEW COMPARISON:  09/13/2019 FINDINGS: Postsurgical changes are noted consistent with the given clinical history of second digit amputation. Degenerative changes are again seen at the first MTP joint stable from the prior exam. No new focal abnormality is noted. IMPRESSION: Postsurgical changes and degenerative changes as described. Electronically Signed   By: Inez Catalina M.D.   On: 09/15/2019 08:58   DG Foot Complete Right  Result Date: 09/13/2019 CLINICAL DATA:  Infection second digit EXAM: RIGHT FOOT COMPLETE - 3+ VIEW COMPARISON:  None. FINDINGS: Frontal, oblique, and lateral views were obtained. There is overlying  bandage at the level of the second distal phalanx. There is apparent cortical irregularity along the lateral aspect of the distal aspect of the second distal phalanx, best seen on frontal view. A small focus of bony destruction in this area is questioned. This finding is not confirmed on the lateral view. No other areas suggesting potential bony destruction seen. No fracture or dislocation. There is marked narrowing and bony overgrowth at the first MTP joint with subchondral cystic change. No erosion in this area. Other joint spaces appear unremarkable. Note that there is spurring in the dorsal midfoot. There are small posterior and inferior calcaneal spurs. IMPRESSION: Suggestion of a focus of potential bony destruction along the lateral most aspect of the distal portion of the second distal phalanx seen only on frontal view. Overlying bandage makes evaluation of this area difficult. It may be reasonable to consider reimaging after removal of the bandage given artifact from the bandage. No other findings suggesting potential bony destruction. No fracture or dislocation. 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 There is spurring in the dorsal midfoot. There are small calcaneal spurs. Electronically Signed   By: Lowella Grip III M.D.   On: 09/13/2019 14:13   DG Foot Complete Right  Result Date: 09/06/2019 Please see detailed radiograph report in office note.    Subjective: Eager to go home  Discharge Exam: Vitals:   09/16/19 0501 09/16/19 0831  BP: 115/61 120/78  Pulse: 80 86  Resp: 14 16  Temp: 98 F (36.7 C) 98.5 F (36.9 C)  SpO2: 94% 98%   Vitals:   09/16/19 0100 09/16/19 0121 09/16/19 0501 09/16/19 0831  BP: (!) 90/50 99/66 115/61 120/78  Pulse: 68 84 80 86  Resp: 14 14 14 16   Temp: 97.7 F (36.5 C) 97.8 F (36.6 C) 98 F (36.7 C) 98.5 F (36.9 C)  TempSrc: Oral Tympanic Oral Oral  SpO2: 95% 96% 94% 98%  Weight:      Height:        General: Pt is alert, awake, not  in acute distress Cardiovascular: RRR, S1/S2 +, no rubs, no gallops Respiratory: CTA bilaterally, no wheezing, no rhonchi Abdominal: Soft, NT, ND, bowel sounds + Extremities: no edema, no cyanosis   The results of significant diagnostics from this hospitalization (including imaging, microbiology, ancillary and laboratory) are listed below for reference.     Microbiology: Recent Results (from the past 240 hour(s))  SARS Coronavirus 2 by RT PCR (hospital order, performed in [facility] hospital lab) Nasopharyngeal Nasopharyngeal Swab     Status: None   Collection Time: 09/13/19  1:07 PM   Specimen: Nasopharyngeal Swab  Result Value Ref Range Status   SARS Coronavirus 2 NEGATIVE NEGATIVE Final    Comment: (NOTE) SARS-CoV-2 target nucleic acids are NOT DETECTED.  The SARS-CoV-2  RNA is generally detectable in upper and lower respiratory specimens during the acute phase of infection. The lowest concentration of SARS-CoV-2 viral copies this assay can detect is 250 copies / mL. A negative result does not preclude SARS-CoV-2 infection and should not be used as the sole basis for treatment or other patient management decisions.  A negative result may occur with improper specimen collection / handling, submission of specimen other than nasopharyngeal swab, presence of viral mutation(s) within the areas targeted by this assay, and inadequate number of viral copies (<250 copies / mL). A negative result must be combined with clinical observations, patient history, and epidemiological information.  Fact Sheet for Patients:   StrictlyIdeas.no  Fact Sheet for Healthcare Providers: BankingDealers.co.za  This test is not yet approved or  cleared by the [place] FDA and has been authorized for detection and/or diagnosis of SARS-CoV-2 by FDA under an Emergency Use Authorization (EUA).  This EUA will remain in effect (meaning this test can be  used) for the duration of the COVID-19 declaration under Section 564(b)(1) of the Act, 21 U.S.C. section 360bbb-3(b)(1), unless the authorization is terminated or revoked sooner.  Performed at [facility], [place] [address], [place], [place] [address]   Culture, blood (routine x 2)     Status: None (Preliminary result)   Collection Time: 09/13/19  1:15 PM   Specimen: BLOOD LEFT HAND  Result Value Ref Range Status   Specimen Description BLOOD LEFT HAND  Final   Special Requests   Final    BOTTLES DRAWN AEROBIC AND ANAEROBIC Blood Culture adequate volume   Culture   Final    NO GROWTH 3 DAYS Performed at [facility], [place] [address], [place], [place] [address]    Report Status PENDING  Incomplete  Culture, blood (routine x 2)     Status: None (Preliminary result)   Collection Time: 09/13/19  1:20 PM   Specimen: BLOOD  Result Value Ref Range Status   Specimen Description BLOOD RIGHT ANTECUBITAL  Final   Special Requests   Final    BOTTLES DRAWN AEROBIC AND ANAEROBIC Blood Culture adequate volume   Culture   Final    NO GROWTH 3 DAYS Performed at [facility], [place] [address] [place], [place] [address]    Report Status PENDING  Incomplete  Surgical pcr screen     Status: None   Collection Time: 09/14/19  8:06 AM   Specimen: Nasal Mucosa; Nasal Swab  Result Value Ref Range Status   MRSA, PCR NEGATIVE NEGATIVE Final   Staphylococcus aureus NEGATIVE NEGATIVE Final    Comment: (NOTE) The Xpert SA Assay (FDA approved for NASAL specimens in patients 37 years of age and older), is one component of a comprehensive surveillance program. It is not intended to diagnose infection nor to guide or monitor treatment. Performed at [facility], [place] [address] [place], [place] [address]      Labs: BNP (last 3 results) No results for input(s): BNP in the last 8760 hours. Basic Metabolic Panel: Recent Labs  Lab 09/13/19 1057 09/13/19 1330 09/14/19 0840  09/15/19 0509  NA 133*  --  132* 134*  K 4.1  --  3.8 3.8  CL 99  --  100 105  CO2 20*  --  22 21*  GLUCOSE 431*  --  216* 187*  BUN 24*  --  22 18  CREATININE 1.27* 1.21 1.13 0.99  CALCIUM 9.3  --  8.5*  8.2*  MG  --  1.4* 1.5* 1.8  PHOS  --  3.7  --   --    Liver Function Tests: Recent Labs  Lab 09/13/19 1057 09/14/19 0840 09/15/19 0509  AST 11* 12* 11*  ALT 11 10 9   ALKPHOS 69 60 55  BILITOT 0.8 1.2 0.9  PROT 7.3 6.8 6.4*  ALBUMIN 2.9* 2.6* 2.3*   No results for input(s): LIPASE, AMYLASE in the last 168 hours. No results for input(s): AMMONIA in the last 168 hours. CBC: Recent Labs  Lab 09/13/19 1057 09/13/19 1330 09/14/19 0840 09/15/19 0509 09/16/19 0456  WBC 14.6* 14.7* 13.8* 12.7* 9.0  NEUTROABS 9.5*  --   --   --   --   HGB 13.6 13.0 12.7* 12.2* 12.7*  HCT 42.2 40.0 38.3* 36.6* 38.1*  MCV 90.8 91.1 89.3 88.6 90.1  PLT 279 269 251 256 257   Cardiac Enzymes: No results for input(s): CKTOTAL, CKMB, CKMBINDEX, TROPONINI in the last 168 hours. BNP: Invalid input(s): POCBNP CBG: Recent Labs  Lab 09/15/19 0816 09/15/19 1153 09/15/19 1643 09/15/19 2035 09/16/19 0834  GLUCAP 151* 215* 210* 193* 215*   D-Dimer No results for input(s): DDIMER in the last 72 hours. Hgb A1c Recent Labs    09/13/19 1330  HGBA1C 10.7*   Lipid Profile No results for input(s): CHOL, HDL, LDLCALC, TRIG, CHOLHDL, LDLDIRECT in the last 72 hours. Thyroid function studies No results for input(s): TSH, T4TOTAL, T3FREE, THYROIDAB in the last 72 hours.  Invalid input(s): FREET3 Anemia work up No results for input(s): VITAMINB12, FOLATE, FERRITIN, TIBC, IRON, RETICCTPCT in the last 72 hours. Urinalysis    Component Value Date/Time   COLORURINE YELLOW 07/19/2014 1659   APPEARANCEUR CLEAR 07/19/2014 1659   LABSPEC 1.015 07/19/2014 1659   PHURINE 5.5 07/19/2014 1659   GLUCOSEU 500 mg/dL 07/19/2014 1659   HGBUR NEGATIVE 07/19/2014 1659   BILIRUBINUR NEGATIVE 07/19/2014 1659    KETONESUR NEGATIVE 07/19/2014 1659   PROTEINUR NEGATIVE 07/19/2014 1659   NITRITE NEGATIVE 07/19/2014 1659   LEUKOCYTESUR NEGATIVE 07/19/2014 1659   Sepsis Labs Invalid input(s): PROCALCITONIN,  WBC,  LACTICIDVEN Microbiology Recent Results (from the past 240 hour(s))  SARS Coronavirus 2 by RT PCR (hospital order, performed in [place] hospital lab) Nasopharyngeal Nasopharyngeal Swab     Status: None   Collection Time: 09/13/19  1:07 PM   Specimen: Nasopharyngeal Swab  Result Value Ref Range Status   SARS Coronavirus 2 NEGATIVE NEGATIVE Final    Comment: (NOTE) SARS-CoV-2 target nucleic acids are NOT DETECTED.  The SARS-CoV-2 RNA is generally detectable in upper and lower respiratory specimens during the acute phase of infection. The lowest concentration of SARS-CoV-2 viral copies this assay can detect is 250 copies / mL. A negative result does not preclude SARS-CoV-2 infection and should not be used as the sole basis for treatment or other patient management decisions.  A negative result may occur with improper specimen collection / handling, submission of specimen other than nasopharyngeal swab, presence of viral mutation(s) within the areas targeted by this assay, and inadequate number of viral copies (<250 copies / mL). A negative result must be combined with clinical observations, patient history, and epidemiological information.  Fact Sheet for Patients:   StrictlyIdeas.no  Fact Sheet for Healthcare Providers: BankingDealers.co.za  This test is not yet approved or  cleared by the [place] FDA and has been authorized for detection and/or diagnosis of SARS-CoV-2 by FDA under an Emergency Use Authorization (EUA).  This EUA  will remain in effect (meaning this test can be used) for the duration of the COVID-19 declaration under Section 564(b)(1) of the Act, 21 U.S.C. section 360bbb-3(b)(1), unless the authorization is  terminated or revoked sooner.  Performed at [facility], [place] [address] [place], [place] [address]   Culture, blood (routine x 2)     Status: None (Preliminary result)   Collection Time: 09/13/19  1:15 PM   Specimen: BLOOD LEFT HAND  Result Value Ref Range Status   Specimen Description BLOOD LEFT HAND  Final   Special Requests   Final    BOTTLES DRAWN AEROBIC AND ANAEROBIC Blood Culture adequate volume   Culture   Final    NO GROWTH 3 DAYS Performed at [facility], [place] [address] [place], [place] [address]    Report Status PENDING  Incomplete  Culture, blood (routine x 2)     Status: None (Preliminary result)   Collection Time: 09/13/19  1:20 PM   Specimen: BLOOD  Result Value Ref Range Status   Specimen Description BLOOD RIGHT ANTECUBITAL  Final   Special Requests   Final    BOTTLES DRAWN AEROBIC AND ANAEROBIC Blood Culture adequate volume   Culture   Final    NO GROWTH 3 DAYS Performed at [facility], [place] [address], [place], [place] [address]    Report Status PENDING  Incomplete  Surgical pcr screen     Status: None   Collection Time: 09/14/19  8:06 AM   Specimen: Nasal Mucosa; Nasal Swab  Result Value Ref Range Status   MRSA, PCR NEGATIVE NEGATIVE Final   Staphylococcus aureus NEGATIVE NEGATIVE Final    Comment: (NOTE) The Xpert SA Assay (FDA approved for NASAL specimens in patients 75 years of age and older), is one component of a comprehensive surveillance program. It is not intended to diagnose infection nor to guide or monitor treatment. Performed at [facility], [place] [address] [place],  [address]    Time spent: 30 min  SIGNED:   Marylu Lund, MD  [facility] 09/16/2019, 9:05 AM  If 7PM-7AM, please contact night-coverage

## 2019-09-16 NOTE — Progress Notes (Signed)
Discharge instructions addressed;Pt in stable condition;Pt.'s wife in room and will be his ride home.

## 2019-09-16 NOTE — Progress Notes (Signed)
Inpatient Diabetes Program Recommendations  AACE/ADA: New Consensus Statement on Inpatient Glycemic Control (2015)  Target Ranges:  Prepandial:   less than 140 mg/dL      Peak postprandial:   less than 180 mg/dL (1-2 hours)      Critically ill patients:  140 - 180 mg/dL   Lab Results  Component Value Date   GLUCAP 215 (H) 09/16/2019   HGBA1C 10.7 (H) 09/13/2019    Review of Glycemic Control Results for Christopher Escobar, Christopher "DON" (MRN [id number]) as of 09/16/2019 10:53  Ref. Range 09/15/2019 11:53 09/15/2019 16:43 09/15/2019 20:35 09/16/2019 08:34  Glucose-Capillary Latest Ref Range: 70 - 99 mg/dL 215 (H) 210 (H) 193 (H) 215 (H)   Diabetes history: Type 2 DMN Outpatient Diabetes medications: Levemir 50 units QHS, Novolog 70/30 24 units BID, Amaryl 4 mg BID Current orders for Inpatient glycemic control: Levemir 50 units QHS, Novolog 0-15 units TID, Novolog 0-5 units QHS  Inpatient Diabetes Program Recommendations:    Consider increasing Levemir to 60 units QHS.   Thanks, Bronson Curb, MSN, RNC-OB Diabetes Coordinator [phone] (8a-5p)

## 2019-09-18 LAB — CULTURE, BLOOD (ROUTINE X 2)
Culture: NO GROWTH
Culture: NO GROWTH
Special Requests: ADEQUATE
Special Requests: ADEQUATE

## 2019-09-22 ENCOUNTER — Ambulatory Visit (INDEPENDENT_AMBULATORY_CARE_PROVIDER_SITE_OTHER): Payer: BC Managed Care – PPO

## 2019-09-22 ENCOUNTER — Other Ambulatory Visit: Payer: Self-pay

## 2019-09-22 ENCOUNTER — Ambulatory Visit (INDEPENDENT_AMBULATORY_CARE_PROVIDER_SITE_OTHER): Payer: BC Managed Care – PPO | Admitting: Podiatry

## 2019-09-22 DIAGNOSIS — L97422 Non-pressure chronic ulcer of left heel and midfoot with fat layer exposed: Secondary | ICD-10-CM

## 2019-09-22 DIAGNOSIS — Z89421 Acquired absence of other right toe(s): Secondary | ICD-10-CM

## 2019-09-22 DIAGNOSIS — E08621 Diabetes mellitus due to underlying condition with foot ulcer: Secondary | ICD-10-CM

## 2019-09-22 DIAGNOSIS — M79676 Pain in unspecified toe(s): Secondary | ICD-10-CM

## 2019-09-22 MED ORDER — SANTYL 250 UNIT/GM EX OINT
1.0000 | TOPICAL_OINTMENT | Freq: Every day | CUTANEOUS | 0 refills | Status: DC
Start: 2019-09-22 — End: 2019-11-17

## 2019-09-23 ENCOUNTER — Encounter: Payer: Self-pay | Admitting: Podiatry

## 2019-09-23 NOTE — Progress Notes (Signed)
Subjective:  Patient ID: Christopher Escobar, male    DOB: 29-Nov-1955,  MRN: [id number]  Chief Complaint  Patient presents with  . Routine Post Op    POV-1 DOS 09/16/19-Toe Amputation- Pt states he is doing well, state he is having slight pain taking his Back pain medication - 7.5 percocet- no f/c/v/n/sob/cp    64 y.o. male presents for wound care.  Patient presents with a follow-up of status post second digit amputation as well as midfoot ulceration.  Patient states the amputation site is doing well.  He does not have any pain.  Denies any other acute complaints.  Is been keeping his bandage clean dry and intact.  He has been wearing his surgical shoe.  He states the left side is also doing a lot better.  Overall there is no drainage noted on the left foot.  He has been applying Santyl wet-to-dry to the left side.  Review of Systems: Negative except as noted in the HPI. Denies N/V/F/Ch.  Past Medical History:  Diagnosis Date  . Diabetes mellitus without complication ([place]) 2951  . Hypertension 2008  . Personal history of colonic polyps    TA polyp  . Sleep apnea    not using cpap    Current Outpatient Medications:  .  atorvastatin (LIPITOR) 40 MG tablet, Take 40 mg by mouth daily at 6 PM. , Disp: , Rfl:  .  collagenase (SANTYL) ointment, Apply 1 application topically daily., Disp: 15 g, Rfl: 0 .  doxycycline (VIBRA-TABS) 100 MG tablet, Take 1 tablet (100 mg total) by mouth every 12 (twelve) hours for 13 days., Disp: 26 tablet, Rfl: 0 .  glimepiride (AMARYL) 4 MG tablet, Take 2 tablets (8 mg total) by mouth 2 (two) times daily. (Patient taking differently: Take 4 mg by mouth 2 (two) times daily. ), Disp: 60 tablet, Rfl: 3 .  insulin detemir (LEVEMIR) 100 UNIT/ML injection, Inject 50 Units into the skin at bedtime. , Disp: , Rfl:  .  lisinopril-hydrochlorothiazide (PRINZIDE,ZESTORETIC) 20-12.5 MG tablet, Take 1 tablet every morning by mouth. , Disp: , Rfl:  .  metoprolol succinate  (TOPROL-XL) 25 MG 24 hr tablet, Take 25 mg every morning by mouth. , Disp: , Rfl:  .  multivitamin-lutein (OCUVITE-LUTEIN) CAPS capsule, Take 1 capsule by mouth 2 (two) times daily., Disp: , Rfl:  .  NOVOLOG MIX 70/30 FLEXPEN (70-30) 100 UNIT/ML FlexPen, Inject 24 Units into the skin 2 (two) times daily with a meal. , Disp: , Rfl:  .  oxyCODONE-acetaminophen (PERCOCET) 7.5-325 MG tablet, Take 1 tablet by mouth 5 (five) times daily., Disp: , Rfl:  .  pregabalin (LYRICA) 75 MG capsule, Take 75 mg by mouth 2 (two) times daily. , Disp: , Rfl:   Social History   Tobacco Use  Smoking Status Former Smoker  . Packs/day: 1.00  . Years: 30.00  . Pack years: 30.00  . Types: Cigarettes  . Quit date: 02/03/2007  . Years since quitting: 12.6  Smokeless Tobacco Never Used    No Known Allergies Objective:  There were no vitals filed for this visit. There is no height or weight on file to calculate BMI. Constitutional Well developed. Well nourished.  Vascular Dorsalis pedis pulses faintly palpable bilaterally. Posterior tibial pulses faintly palpable bilaterally. Capillary refill normal to all digits.  No cyanosis or clubbing noted. Pedal hair growth normal.  Neurologic Normal speech. Oriented to person, place, and time. Protective sensation absent  Dermatologic Wound Location: .  Marland Kitchen Left plantar midfoot  wound probes down to muscle layer but does not probe down to bone.  No signs of infection.  No purulent drainage noted.  No malodor present. Wound Base: Mixed Granular/Fibrotic Peri-wound: Calloused Exudate: Scant/small amount Serosanguinous exudate Wound Measurements: -See below  Incision are well coapted.  No clinical signs of dehiscence or infection noted.  Stitches are intact and appears to be holding well.  Orthopedic: No pain to palpation either foot.   Radiographs: 3 views of skeletally mature adult foot bilateral: No signs of osteomyelitis noted.  No cortical irregularity or  destruction of the second toe on the right or the plantar midfoot wound.  No soft tissue emphysema noted.  No foreign body noted. Assessment:   1. Diabetic ulcer of left midfoot associated with diabetes mellitus due to underlying condition, with fat layer exposed ([place])   2. History of amputation of lesser toe of right foot ([place])    Plan:  Patient was evaluated and treated and all questions answered.  Right second digit osteomyelitis status post right second digit amputation at the level of the metatarsophalangeal joint -Healing well.  No signs of dehiscence noted.  Stitches are intact.  No clinical signs of infection daily. -Continue wearing surgical shoe and offloading.   Ulcer  left lateral plantar midfoot with fat layer exposed~improving -Debridement as below. -Dressed with  Betadine wet-to-dry dressing DSD. -Continue off-loading with bilateral surgical shoe. -Doxycycline was dispensed for skin and soft tissue prophylaxis -He will need to take time off from work to take the pressure away from the ulceration.  If he is unable to do so there is a chance that he may get worsening infection of the foot and may end up losing part of his foot. -    Procedure: Excisional Debridement of Wound left lateral plantar midfoot wound~improving Tool: Sharp chisel blade/tissue nipper Rationale: Removal of non-viable soft tissue from the wound to promote healing.  Anesthesia: none Pre-Debridement Wound Measurements: 1.3 cm x 1.3 cm x 0.4 cm Post-Debridement Wound Measurements: 1.4 cm x 1.4 cm x 0.4 cm Type of Debridement: Sharp Excisional Tissue Removed: Non-viable soft tissue Blood loss: Minimal (<50cc) Depth of Debridement: subcutaneous tissue. Technique: Sharp excisional debridement to bleeding, viable wound base.  Wound Progress: The wound is decreasing in size. Dressing: Dry, sterile, compression dressing. Disposition: Patient tolerated procedure well. Patient to return in 1 week for  follow-up.  No follow-ups on file.

## 2019-09-29 ENCOUNTER — Other Ambulatory Visit: Payer: Self-pay

## 2019-09-29 ENCOUNTER — Telehealth: Payer: Self-pay | Admitting: Podiatry

## 2019-09-29 ENCOUNTER — Ambulatory Visit: Payer: BC Managed Care – PPO | Admitting: Podiatry

## 2019-09-29 DIAGNOSIS — L97422 Non-pressure chronic ulcer of left heel and midfoot with fat layer exposed: Secondary | ICD-10-CM | POA: Diagnosis not present

## 2019-09-29 DIAGNOSIS — E08621 Diabetes mellitus due to underlying condition with foot ulcer: Secondary | ICD-10-CM | POA: Diagnosis not present

## 2019-09-29 DIAGNOSIS — Z89421 Acquired absence of other right toe(s): Secondary | ICD-10-CM

## 2019-09-29 NOTE — Telephone Encounter (Signed)
[facility]  Called and state dthat they are missing the # of wounds the size of wounds and day supply for the santyl please contact [phone]

## 2019-09-30 ENCOUNTER — Encounter: Payer: Self-pay | Admitting: Podiatry

## 2019-09-30 NOTE — Progress Notes (Signed)
Subjective:  Patient ID: Christopher Escobar, male    DOB: 04/29/1955,  MRN: [id number]  Chief Complaint  Patient presents with  . Routine Post Op    POV-2 DOS06/18/21 Toe Amputation    64 y.o. male presents for wound care.  Patient presents with a follow-up of status post second digit amputation as well as midfoot ulceration.  Patient states the amputation site is doing well.  He does not have any pain.  Denies any other acute complaints.  Is been keeping his bandage clean dry and intact.  He has been wearing his surgical shoe.  He states the left side is also doing a lot better.  Overall there is no drainage noted on the left foot.  He has been applying Santyl wet-to-dry to the left side.  Review of Systems: Negative except as noted in the HPI. Denies N/V/F/Ch.  Past Medical History:  Diagnosis Date  . Diabetes mellitus without complication ([place]) 3154  . Hypertension 2008  . Personal history of colonic polyps    TA polyp  . Sleep apnea    not using cpap    Current Outpatient Medications:  .  atorvastatin (LIPITOR) 40 MG tablet, Take 40 mg by mouth daily at 6 PM. , Disp: , Rfl:  .  collagenase (SANTYL) ointment, Apply 1 application topically daily., Disp: 15 g, Rfl: 0 .  glimepiride (AMARYL) 4 MG tablet, Take 2 tablets (8 mg total) by mouth 2 (two) times daily. (Patient taking differently: Take 4 mg by mouth 2 (two) times daily. ), Disp: 60 tablet, Rfl: 3 .  insulin detemir (LEVEMIR) 100 UNIT/ML injection, Inject 50 Units into the skin at bedtime. , Disp: , Rfl:  .  lisinopril-hydrochlorothiazide (PRINZIDE,ZESTORETIC) 20-12.5 MG tablet, Take 1 tablet every morning by mouth. , Disp: , Rfl:  .  metoprolol succinate (TOPROL-XL) 25 MG 24 hr tablet, Take 25 mg every morning by mouth. , Disp: , Rfl:  .  multivitamin-lutein (OCUVITE-LUTEIN) CAPS capsule, Take 1 capsule by mouth 2 (two) times daily., Disp: , Rfl:  .  NOVOLOG MIX 70/30 FLEXPEN (70-30) 100 UNIT/ML FlexPen, Inject 24 Units into  the skin 2 (two) times daily with a meal. , Disp: , Rfl:  .  oxyCODONE-acetaminophen (PERCOCET) 7.5-325 MG tablet, Take 1 tablet by mouth 5 (five) times daily., Disp: , Rfl:  .  pregabalin (LYRICA) 75 MG capsule, Take 75 mg by mouth 2 (two) times daily. , Disp: , Rfl:   Social History   Tobacco Use  Smoking Status Former Smoker  . Packs/day: 1.00  . Years: 30.00  . Pack years: 30.00  . Types: Cigarettes  . Quit date: 02/03/2007  . Years since quitting: 12.6  Smokeless Tobacco Never Used    No Known Allergies Objective:  There were no vitals filed for this visit. There is no height or weight on file to calculate BMI. Constitutional Well developed. Well nourished.  Vascular Dorsalis pedis pulses faintly palpable bilaterally. Posterior tibial pulses faintly palpable bilaterally. Capillary refill normal to all digits.  No cyanosis or clubbing noted. Pedal hair growth normal.  Neurologic Normal speech. Oriented to person, place, and time. Protective sensation absent  Dermatologic Wound Location: .  Marland Kitchen Left plantar midfoot wound probes down to muscle layer but does not probe down to bone.  No signs of infection.  No purulent drainage noted.  No malodor present. Wound Base: Mixed Granular/Fibrotic Peri-wound: Calloused Exudate: Scant/small amount Serosanguinous exudate Wound Measurements: -See below  Incision are well coapted.  No  clinical signs of dehiscence or infection noted.  Stitches are intact and appears to be holding well.  Orthopedic: No pain to palpation either foot.   Radiographs: 3 views of skeletally mature adult foot bilateral: No signs of osteomyelitis noted.  No cortical irregularity or destruction of the second toe on the right or the plantar midfoot wound.  No soft tissue emphysema noted.  No foreign body noted. Assessment:   1. History of amputation of lesser toe of right foot ([place])   2. Diabetic ulcer of left midfoot associated with diabetes mellitus due to  underlying condition, with fat layer exposed ([place])    Plan:  Patient was evaluated and treated and all questions answered.  Right second digit osteomyelitis status post right second digit amputation at the level of the metatarsophalangeal joint -Skin has repeat cellulitis.  No signs of dehiscence noted.  The stitches were removed today. -Continue wearing surgical shoe and offloading.   Ulcer  left lateral plantar midfoot with fat layer exposed~improving -Debridement as below. -Dressed with  Betadine wet-to-dry dressing DSD. -Continue off-loading with bilateral surgical shoe. -Doxycycline was dispensed for skin and soft tissue prophylaxis -He will need to take time off from work to take the pressure away from the ulceration.  If he is unable to do so there is a chance that he may get worsening infection of the foot and may end up losing part of his foot. -    Procedure: Excisional Debridement of Wound left lateral plantar midfoot wound~improving Tool: Sharp chisel blade/tissue nipper Rationale: Removal of non-viable soft tissue from the wound to promote healing.  Anesthesia: none Pre-Debridement Wound Measurements: 1.0 cm x 1.0 cm x 0.3 cm Post-Debridement Wound Measurements: 1.1 cm x 1.1 cm x 0.3 cm Type of Debridement: Sharp Excisional Tissue Removed: Non-viable soft tissue Blood loss: Minimal (<50cc) Depth of Debridement: subcutaneous tissue. Technique: Sharp excisional debridement to bleeding, viable wound base.  Wound Progress: The wound is decreasing in size. Dressing: Dry, sterile, compression dressing. Disposition: Patient tolerated procedure well. Patient to return in 1 week for follow-up.  No follow-ups on file.

## 2019-10-10 DIAGNOSIS — M79676 Pain in unspecified toe(s): Secondary | ICD-10-CM

## 2019-10-13 ENCOUNTER — Ambulatory Visit: Payer: BC Managed Care – PPO | Admitting: Podiatry

## 2019-10-13 ENCOUNTER — Telehealth: Payer: Self-pay | Admitting: Podiatry

## 2019-10-13 ENCOUNTER — Ambulatory Visit (INDEPENDENT_AMBULATORY_CARE_PROVIDER_SITE_OTHER): Payer: BC Managed Care – PPO

## 2019-10-13 ENCOUNTER — Other Ambulatory Visit: Payer: Self-pay

## 2019-10-13 DIAGNOSIS — Z89421 Acquired absence of other right toe(s): Secondary | ICD-10-CM | POA: Diagnosis not present

## 2019-10-13 DIAGNOSIS — L97422 Non-pressure chronic ulcer of left heel and midfoot with fat layer exposed: Secondary | ICD-10-CM

## 2019-10-13 DIAGNOSIS — E08621 Diabetes mellitus due to underlying condition with foot ulcer: Secondary | ICD-10-CM

## 2019-10-13 NOTE — Telephone Encounter (Signed)
[facility] called and stated that they need a call back about missing information on medication sent in for pt

## 2019-10-14 ENCOUNTER — Encounter: Payer: Self-pay | Admitting: Podiatry

## 2019-10-14 NOTE — Progress Notes (Signed)
Subjective:  Patient ID: Christopher Escobar, male    DOB: [birth year],  MRN: [id number]  No chief complaint on file.   64 y.o. male presents for wound care.  Patient presents with a follow-up of status post second digit amputation as well as midfoot ulceration.  The second amputation site has healed completely.  He does not have any pain.  The wound on the left side has considerably decreased.  He denies any other acute complaints.  Review of Systems: Negative except as noted in the HPI. Denies N/V/F/Ch.  Past Medical History:  Diagnosis Date   Diabetes mellitus without complication ([place]) 5631   Hypertension 2008   Personal history of colonic polyps    TA polyp   Sleep apnea    not using cpap    Current Outpatient Medications:    atorvastatin (LIPITOR) 40 MG tablet, Take 40 mg by mouth daily at 6 PM. , Disp: , Rfl:    collagenase (SANTYL) ointment, Apply 1 application topically daily., Disp: 15 g, Rfl: 0   glimepiride (AMARYL) 4 MG tablet, Take 2 tablets (8 mg total) by mouth 2 (two) times daily. (Patient taking differently: Take 4 mg by mouth 2 (two) times daily. ), Disp: 60 tablet, Rfl: 3   insulin detemir (LEVEMIR) 100 UNIT/ML injection, Inject 50 Units into the skin at bedtime. , Disp: , Rfl:    lisinopril-hydrochlorothiazide (PRINZIDE,ZESTORETIC) 20-12.5 MG tablet, Take 1 tablet every morning by mouth. , Disp: , Rfl:    metoprolol succinate (TOPROL-XL) 25 MG 24 hr tablet, Take 25 mg every morning by mouth. , Disp: , Rfl:    multivitamin-lutein (OCUVITE-LUTEIN) CAPS capsule, Take 1 capsule by mouth 2 (two) times daily., Disp: , Rfl:    NOVOLOG MIX 70/30 FLEXPEN (70-30) 100 UNIT/ML FlexPen, Inject 24 Units into the skin 2 (two) times daily with a meal. , Disp: , Rfl:    oxyCODONE-acetaminophen (PERCOCET) 7.5-325 MG tablet, Take 1 tablet by mouth 5 (five) times daily., Disp: , Rfl:    pregabalin (LYRICA) 75 MG capsule, Take 75 mg by mouth 2 (two) times daily. , Disp: ,  Rfl:   Social History   Tobacco Use  Smoking Status Former Smoker   Packs/day: 1.00   Years: 30.00   Pack years: 30.00   Types: Cigarettes   Quit date: 02/03/2007   Years since quitting: 12.7  Smokeless Tobacco Never Used    No Known Allergies Objective:  There were no vitals filed for this visit. There is no height or weight on file to calculate BMI. Constitutional Well developed. Well nourished.  Vascular Dorsalis pedis pulses faintly palpable bilaterally. Posterior tibial pulses faintly palpable bilaterally. Capillary refill normal to all digits.  No cyanosis or clubbing noted. Pedal hair growth normal.  Neurologic Normal speech. Oriented to person, place, and time. Protective sensation absent  Dermatologic Wound Location: .  Marland Kitchen Left plantar midfoot wound probes down to muscle layer but does not probe down to bone.  No signs of infection.  No purulent drainage noted.  No malodor present. Wound Base: Mixed Granular/Fibrotic Peri-wound: Calloused Exudate: Scant/small amount Serosanguinous exudate Wound Measurements: -See below  Right second toe amputation site has completely reepithelialized.  No signs of dehiscence noted.  Orthopedic: No pain to palpation either foot.   Radiographs: 3 views of skeletally mature adult foot bilateral: No signs of osteomyelitis noted.  No cortical irregularity or destruction of the second toe on the right or the plantar midfoot wound.  No soft tissue emphysema noted.  No foreign body noted. Assessment:   1. S/P amputation of lesser toe, right ([place])    Plan:  Patient was evaluated and treated and all questions answered.  Right second digit osteomyelitis status post right second digit amputation at the level of the metatarsophalangeal joint -Clinically resolved.  The skin has reepithelialized.  No further wound noted.   Ulcer  left lateral plantar midfoot with fat layer exposed~improving -Debridement as below. -Dressed with   Betadine wet-to-dry dressing DSD. -Continue off-loading with bilateral surgical shoe. -Doxycycline was dispensed for skin and soft tissue prophylaxis -He will need to take time off from work to take the pressure away from the ulceration.  If he is unable to do so there is a chance that he may get worsening infection of the foot and may end up losing part of his foot. -    Procedure: Excisional Debridement of Wound left lateral plantar midfoot wound~improving Tool: Sharp chisel blade/tissue nipper Rationale: Removal of non-viable soft tissue from the wound to promote healing.  Anesthesia: none Pre-Debridement Wound Measurements: 0.5 cm x 0.5 cm x 0.2 cm Post-Debridement Wound Measurements: 0.6 cm x 0.5 cm x 0.2 cm Type of Debridement: Sharp Excisional Tissue Removed: Non-viable soft tissue Blood loss: Minimal (<50cc) Depth of Debridement: subcutaneous tissue. Technique: Sharp excisional debridement to bleeding, viable wound base.  Wound Progress: The wound is decreasing in size. Dressing: Dry, sterile, compression dressing. Disposition: Patient tolerated procedure well. Patient to return in 1 week for follow-up.  No follow-ups on file.

## 2019-11-03 ENCOUNTER — Encounter: Payer: Self-pay | Admitting: Podiatry

## 2019-11-03 ENCOUNTER — Other Ambulatory Visit: Payer: Self-pay

## 2019-11-03 ENCOUNTER — Ambulatory Visit: Payer: Medicare Other | Admitting: Podiatry

## 2019-11-03 DIAGNOSIS — S90822A Blister (nonthermal), left foot, initial encounter: Secondary | ICD-10-CM

## 2019-11-03 DIAGNOSIS — Z794 Long term (current) use of insulin: Secondary | ICD-10-CM

## 2019-11-03 DIAGNOSIS — E119 Type 2 diabetes mellitus without complications: Secondary | ICD-10-CM

## 2019-11-03 MED ORDER — DOXYCYCLINE HYCLATE 100 MG PO TABS
100.0000 mg | ORAL_TABLET | Freq: Two times a day (BID) | ORAL | 0 refills | Status: DC
Start: 2019-11-03 — End: 2020-07-26

## 2019-11-04 ENCOUNTER — Encounter: Payer: Self-pay | Admitting: Podiatry

## 2019-11-04 NOTE — Progress Notes (Signed)
Subjective:  Patient ID: Christopher Escobar, male    DOB: 08-12-1955,  MRN: [id number]  Chief Complaint  Patient presents with  . Foot Ulcer    "it was doing good then a new blister formed on my left foot last Friday"    64 y.o. male presents for wound care.  Patient is doing well.  He states that both of the wound sites are doing good.  However he had a new complaint of left blister formation to the plantar wound which came out of nowhere near where the other wound was.  Patient states that just when everything was doing good is this new thing developed.  He denies any acute complaints he has not seen anyone else for this new blister prior to seeing me.  Both of his other wounds have completely healed.  He denies any signs of infection  Review of Systems: Negative except as noted in the HPI. Denies N/V/F/Ch.  Past Medical History:  Diagnosis Date  . Diabetes mellitus without complication ([place]) 0962  . Hypertension 2008  . Personal history of colonic polyps    TA polyp  . Sleep apnea    not using cpap    Current Outpatient Medications:  .  atorvastatin (LIPITOR) 40 MG tablet, Take 40 mg by mouth daily at 6 PM. , Disp: , Rfl:  .  collagenase (SANTYL) ointment, Apply 1 application topically daily., Disp: 15 g, Rfl: 0 .  doxycycline (VIBRA-TABS) 100 MG tablet, Take 1 tablet (100 mg total) by mouth 2 (two) times daily., Disp: 20 tablet, Rfl: 0 .  glimepiride (AMARYL) 4 MG tablet, Take 2 tablets (8 mg total) by mouth 2 (two) times daily. (Patient taking differently: Take 4 mg by mouth 2 (two) times daily. ), Disp: 60 tablet, Rfl: 3 .  insulin detemir (LEVEMIR) 100 UNIT/ML injection, Inject 50 Units into the skin at bedtime. , Disp: , Rfl:  .  lisinopril-hydrochlorothiazide (PRINZIDE,ZESTORETIC) 20-12.5 MG tablet, Take 1 tablet every morning by mouth. , Disp: , Rfl:  .  metoprolol succinate (TOPROL-XL) 25 MG 24 hr tablet, Take 25 mg every morning by mouth. , Disp: , Rfl:  .   multivitamin-lutein (OCUVITE-LUTEIN) CAPS capsule, Take 1 capsule by mouth 2 (two) times daily., Disp: , Rfl:  .  NOVOLOG MIX 70/30 FLEXPEN (70-30) 100 UNIT/ML FlexPen, Inject 24 Units into the skin 2 (two) times daily with a meal. , Disp: , Rfl:  .  oxyCODONE-acetaminophen (PERCOCET) 7.5-325 MG tablet, Take 1 tablet by mouth 5 (five) times daily., Disp: , Rfl:  .  pregabalin (LYRICA) 75 MG capsule, Take 75 mg by mouth 2 (two) times daily. , Disp: , Rfl:   Social History   Tobacco Use  Smoking Status Former Smoker  . Packs/day: 1.00  . Years: 30.00  . Pack years: 30.00  . Types: Cigarettes  . Quit date: 02/03/2007  . Years since quitting: 12.7  Smokeless Tobacco Never Used    No Known Allergies Objective:  There were no vitals filed for this visit. There is no height or weight on file to calculate BMI. Constitutional Well developed. Well nourished.  Vascular Dorsalis pedis pulses faintly palpable bilaterally. Posterior tibial pulses faintly palpable bilaterally. Capillary refill normal to all digits.  No cyanosis or clubbing noted. Pedal hair growth normal.  Neurologic Normal speech. Oriented to person, place, and time. Protective sensation absent  Dermatologic Wound Location: .  Marland Kitchen Left plantar midfoot wound resolved with reepithelialization of the skin tissue. Left plantar midfoot blister  formation with underlying granular wound bed noted.  Serosanguineous fluid was drained.  No deep wound or abscess formation palpated.  No other clinical signs of infection noted.  Right second toe amputation site has completely reepithelialized.  No signs of dehiscence noted.  Orthopedic: No pain to palpation either foot.   Radiographs: None Assessment:   1. Type 2 diabetes mellitus without complication, with long-term current use of insulin ([place])   2. Friction blister of the foot, left, initial encounter    Plan:  Patient was evaluated and treated and all questions answered.  Left  plantar midfoot friction blister -I explained patient the etiology of friction blister and various treatment options were extensively discussed.  Upon debridement there was only serosanguineous fluid that was decompressed.  Mild superficial underlying wound noted.  At this time we will continue local wound care with Betadine wet-to-dry 3 times a week.  Patient states understanding will immediately start that. -He will continue wearing surgical shoe until resolve meant.  Right second digit osteomyelitis status post right second digit amputation at the level of the metatarsophalangeal joint -Clinically resolved.  The skin has reepithelialized.  No further wound noted.   Ulcer  left lateral plantar midfoot with fat layer exposed -Clinically resolved.  No further wound noted after debridement.

## 2019-11-10 ENCOUNTER — Ambulatory Visit: Payer: Medicare Other | Admitting: Podiatry

## 2019-11-11 ENCOUNTER — Other Ambulatory Visit: Payer: Self-pay

## 2019-11-11 ENCOUNTER — Telehealth: Payer: Self-pay

## 2019-11-11 ENCOUNTER — Ambulatory Visit: Payer: Medicare Other | Admitting: Orthotics

## 2019-11-11 ENCOUNTER — Ambulatory Visit (INDEPENDENT_AMBULATORY_CARE_PROVIDER_SITE_OTHER): Payer: BC Managed Care – PPO | Admitting: Podiatry

## 2019-11-11 DIAGNOSIS — E119 Type 2 diabetes mellitus without complications: Secondary | ICD-10-CM | POA: Diagnosis not present

## 2019-11-11 DIAGNOSIS — Z794 Long term (current) use of insulin: Secondary | ICD-10-CM

## 2019-11-11 DIAGNOSIS — L97422 Non-pressure chronic ulcer of left heel and midfoot with fat layer exposed: Secondary | ICD-10-CM

## 2019-11-14 ENCOUNTER — Encounter: Payer: Self-pay | Admitting: Podiatry

## 2019-11-14 ENCOUNTER — Ambulatory Visit: Payer: BC Managed Care – PPO | Admitting: Orthotics

## 2019-11-14 NOTE — Progress Notes (Signed)
Subjective:  Patient ID: Christopher Escobar, male    DOB: December 16, 1955,  MRN: [id number]  Chief Complaint  Patient presents with  . Foot Pain    pt has a painful sore on the left plantar forefoot, pt states that the pain is elevated to the touch. Pt is also concerned that the foot is possibly getting infected.    64 y.o. male presents for wound care.  Patient presents with a new ulceration to the left plantar midfoot.  The old midfoot ulceration has healed.  Patient states that this started off with a blister for which I treated during last clinic visit which has now resulted into wound formation with fat layer exposed.  Patient states he has been ambulating with surgical shoe has been doing local wound care with Betadine wet-to-dry dressing changes he denies any other acute complaints.   Review of Systems: Negative except as noted in the HPI. Denies N/V/F/Ch.  Past Medical History:  Diagnosis Date  . Diabetes mellitus without complication ([place]) 9381  . Hypertension 2008  . Personal history of colonic polyps    TA polyp  . Sleep apnea    not using cpap    Current Outpatient Medications:  .  atorvastatin (LIPITOR) 40 MG tablet, Take 40 mg by mouth daily at 6 PM. , Disp: , Rfl:  .  collagenase (SANTYL) ointment, Apply 1 application topically daily., Disp: 15 g, Rfl: 0 .  doxycycline (VIBRA-TABS) 100 MG tablet, Take 1 tablet (100 mg total) by mouth 2 (two) times daily., Disp: 20 tablet, Rfl: 0 .  glimepiride (AMARYL) 4 MG tablet, Take 2 tablets (8 mg total) by mouth 2 (two) times daily. (Patient taking differently: Take 4 mg by mouth 2 (two) times daily. ), Disp: 60 tablet, Rfl: 3 .  insulin detemir (LEVEMIR) 100 UNIT/ML injection, Inject 50 Units into the skin at bedtime. , Disp: , Rfl:  .  lisinopril-hydrochlorothiazide (PRINZIDE,ZESTORETIC) 20-12.5 MG tablet, Take 1 tablet every morning by mouth. , Disp: , Rfl:  .  metoprolol succinate (TOPROL-XL) 25 MG 24 hr tablet, Take 25 mg every  morning by mouth. , Disp: , Rfl:  .  multivitamin-lutein (OCUVITE-LUTEIN) CAPS capsule, Take 1 capsule by mouth 2 (two) times daily., Disp: , Rfl:  .  NOVOLOG MIX 70/30 FLEXPEN (70-30) 100 UNIT/ML FlexPen, Inject 24 Units into the skin 2 (two) times daily with a meal. , Disp: , Rfl:  .  oxyCODONE-acetaminophen (PERCOCET) 7.5-325 MG tablet, Take 1 tablet by mouth 5 (five) times daily., Disp: , Rfl:  .  pregabalin (LYRICA) 75 MG capsule, Take 75 mg by mouth 2 (two) times daily. , Disp: , Rfl:   Social History   Tobacco Use  Smoking Status Former Smoker  . Packs/day: 1.00  . Years: 30.00  . Pack years: 30.00  . Types: Cigarettes  . Quit date: 02/03/2007  . Years since quitting: 12.7  Smokeless Tobacco Never Used    No Known Allergies Objective:  There were no vitals filed for this visit. There is no height or weight on file to calculate BMI. Constitutional Well developed. Well nourished.  Vascular Dorsalis pedis pulses palpable bilaterally. Posterior tibial pulses palpable bilaterally. Capillary refill normal to all digits.  No cyanosis or clubbing noted. Pedal hair growth normal.  Neurologic Normal speech. Oriented to person, place, and time. Protective sensation absent  Dermatologic Wound Location: Left plantar midfoot with fat layer exposed.  Does not probe down to bone.  No clinical signs of infection noted. Wound  Base: Mixed Granular/Fibrotic Peri-wound: Macerated Exudate: Scant/small amount Serosanguinous exudate Wound Measurements: -See below  Orthopedic: No pain to palpation either foot.   Radiographs: None Assessment:   1. Type 2 diabetes mellitus without complication, with long-term current use of insulin ([place])   2. Left midfoot ulcer, with fat layer exposed ([place])    Plan:  Patient was evaluated and treated and all questions answered.  Ulcer left plantar midfoot with fat layer exposed -Debridement as below. -Dressed with Santyl wet-to-dry, DSD. -Continue  off-loading with surgical shoe.  Procedure: Excisional Debridement of Wound Tool: Sharp chisel blade/tissue nipper Rationale: Removal of non-viable soft tissue from the wound to promote healing.  Anesthesia: none Pre-Debridement Wound Measurements: 2 cm x 2 cm x 0.3 cm Post-Debridement Wound Measurements: 2.1 cm x 2.1 cm x 0.3 cm Type of Debridement: Sharp Excisional Tissue Removed: Non-viable soft tissue Blood loss: Minimal (<50cc) Depth of Debridement: subcutaneous tissue. Technique: Sharp excisional debridement to bleeding, viable wound base.  Wound Progress: This is my initial evaluation since the development of wound.  I will continue monitor the progression of it. Site healing conversation 7 Dressing: Dry, sterile, compression dressing. Disposition: Patient tolerated procedure well. Patient to return in 1 week for follow-up.  No follow-ups on file.

## 2019-11-17 ENCOUNTER — Ambulatory Visit: Payer: BC Managed Care – PPO | Admitting: Podiatry

## 2019-11-17 ENCOUNTER — Telehealth: Payer: Self-pay

## 2019-11-17 MED ORDER — SANTYL 250 UNIT/GM EX OINT: 1.0000 "application " | TOPICAL_OINTMENT | Freq: Every day | CUTANEOUS | 1 refills | Status: DC

## 2019-11-17 NOTE — Telephone Encounter (Signed)
Patient called requesting refill of Santyl cream.   Per Dr. Posey Pronto, ok to refill.  Script has been sent to pharmacy

## 2019-11-22 ENCOUNTER — Ambulatory Visit (INDEPENDENT_AMBULATORY_CARE_PROVIDER_SITE_OTHER): Payer: BC Managed Care – PPO | Admitting: Podiatry

## 2019-11-22 ENCOUNTER — Other Ambulatory Visit: Payer: Self-pay

## 2019-11-22 ENCOUNTER — Encounter: Payer: Self-pay | Admitting: Podiatry

## 2019-11-22 DIAGNOSIS — Z794 Long term (current) use of insulin: Secondary | ICD-10-CM | POA: Diagnosis not present

## 2019-11-22 DIAGNOSIS — E119 Type 2 diabetes mellitus without complications: Secondary | ICD-10-CM

## 2019-11-22 DIAGNOSIS — L97422 Non-pressure chronic ulcer of left heel and midfoot with fat layer exposed: Secondary | ICD-10-CM | POA: Diagnosis not present

## 2019-11-22 NOTE — Progress Notes (Signed)
Subjective:  Patient ID: Christopher Escobar, male    DOB: 19-May-1955,  MRN: [id number]  Chief Complaint  Patient presents with  . Diabetic Ulcer    "its about the same"    64 y.o. male presents for wound care.  Patient presents with left plantar midfoot wound.  Patient states he has been doing Santyl wet-to-dry dressing changes been ambulating with surgical shoe.  He denies any other acute complaints.   Review of Systems: Negative except as noted in the HPI. Denies N/V/F/Ch.  Past Medical History:  Diagnosis Date  . Diabetes mellitus without complication ([place]) 3818  . Hypertension 2008  . Personal history of colonic polyps    TA polyp  . Sleep apnea    not using cpap    Current Outpatient Medications:  .  atorvastatin (LIPITOR) 40 MG tablet, Take 40 mg by mouth daily at 6 PM. , Disp: , Rfl:  .  collagenase (SANTYL) ointment, Apply 1 application topically daily., Disp: 30 g, Rfl: 1 .  doxycycline (VIBRA-TABS) 100 MG tablet, Take 1 tablet (100 mg total) by mouth 2 (two) times daily., Disp: 20 tablet, Rfl: 0 .  glimepiride (AMARYL) 4 MG tablet, Take 2 tablets (8 mg total) by mouth 2 (two) times daily. (Patient taking differently: Take 4 mg by mouth 2 (two) times daily. ), Disp: 60 tablet, Rfl: 3 .  insulin detemir (LEVEMIR) 100 UNIT/ML injection, Inject 50 Units into the skin at bedtime. , Disp: , Rfl:  .  lisinopril-hydrochlorothiazide (PRINZIDE,ZESTORETIC) 20-12.5 MG tablet, Take 1 tablet every morning by mouth. , Disp: , Rfl:  .  metoprolol succinate (TOPROL-XL) 25 MG 24 hr tablet, Take 25 mg every morning by mouth. , Disp: , Rfl:  .  multivitamin-lutein (OCUVITE-LUTEIN) CAPS capsule, Take 1 capsule by mouth 2 (two) times daily., Disp: , Rfl:  .  NOVOLOG MIX 70/30 FLEXPEN (70-30) 100 UNIT/ML FlexPen, Inject 24 Units into the skin 2 (two) times daily with a meal. , Disp: , Rfl:  .  oxyCODONE-acetaminophen (PERCOCET) 7.5-325 MG tablet, Take 1 tablet by mouth 5 (five) times daily.,  Disp: , Rfl:  .  pregabalin (LYRICA) 75 MG capsule, Take 75 mg by mouth 2 (two) times daily. , Disp: , Rfl:   Social History   Tobacco Use  Smoking Status Former Smoker  . Packs/day: 1.00  . Years: 30.00  . Pack years: 30.00  . Types: Cigarettes  . Quit date: 02/03/2007  . Years since quitting: 12.8  Smokeless Tobacco Never Used    No Known Allergies Objective:  There were no vitals filed for this visit. There is no height or weight on file to calculate BMI. Constitutional Well developed. Well nourished.  Vascular Dorsalis pedis pulses palpable bilaterally. Posterior tibial pulses palpable bilaterally. Capillary refill normal to all digits.  No cyanosis or clubbing noted. Pedal hair growth normal.  Neurologic Normal speech. Oriented to person, place, and time. Protective sensation absent  Dermatologic Wound Location: Left plantar midfoot with fat layer exposed.  Does not probe down to bone.  No clinical signs of infection noted. Wound Base: Mixed Granular/Fibrotic Peri-wound: Macerated Exudate: Scant/small amount Serosanguinous exudate Wound Measurements: -See below  Orthopedic: No pain to palpation either foot.   Radiographs: None Assessment:   1. Type 2 diabetes mellitus without complication, with long-term current use of insulin ([place])   2. Left midfoot ulcer, with fat layer exposed ([place])    Plan:  Patient was evaluated and treated and all questions answered.  Ulcer left  plantar midfoot with fat layer exposed -Debridement as below. -Dressed with Santyl wet-to-dry, DSD. -Continue off-loading with surgical shoe.  Procedure: Excisional Debridement of Wound Tool: Sharp chisel blade/tissue nipper Rationale: Removal of non-viable soft tissue from the wound to promote healing.  Anesthesia: none Pre-Debridement Wound Measurements: 1.7 cm x 1.7 cm x 0.3 cm Post-Debridement Wound Measurements: 1.8 cm x 1.7 cm x 0.3 cm Type of Debridement: Sharp Excisional Tissue  Removed: Non-viable soft tissue Blood loss: Minimal (<50cc) Depth of Debridement: subcutaneous tissue. Technique: Sharp excisional debridement to bleeding, viable wound base.  Wound Progress: The wound is decreasing in size.  The depth is about the same. Site healing conversation 7 Dressing: Dry, sterile, compression dressing. Disposition: Patient tolerated procedure well. Patient to return in 1 week for follow-up.  No follow-ups on file.

## 2019-11-25 DIAGNOSIS — M79676 Pain in unspecified toe(s): Secondary | ICD-10-CM

## 2019-12-06 ENCOUNTER — Ambulatory Visit (INDEPENDENT_AMBULATORY_CARE_PROVIDER_SITE_OTHER): Payer: BC Managed Care – PPO | Admitting: Podiatry

## 2019-12-06 ENCOUNTER — Encounter: Payer: Self-pay | Admitting: Podiatry

## 2019-12-06 ENCOUNTER — Other Ambulatory Visit: Payer: Self-pay

## 2019-12-06 DIAGNOSIS — E119 Type 2 diabetes mellitus without complications: Secondary | ICD-10-CM | POA: Diagnosis not present

## 2019-12-06 DIAGNOSIS — Z794 Long term (current) use of insulin: Secondary | ICD-10-CM

## 2019-12-06 DIAGNOSIS — L97422 Non-pressure chronic ulcer of left heel and midfoot with fat layer exposed: Secondary | ICD-10-CM | POA: Diagnosis not present

## 2019-12-06 NOTE — Progress Notes (Signed)
Subjective:  Patient ID: Christopher Escobar, male    DOB: [birth year],  MRN: [id number]  Chief Complaint  Patient presents with  . Diabetic Ulcer    "I think its doing better"    64 y.o. male presents for wound care.  Patient presents with left plantar midfoot wound.  Patient states he has been doing Santyl wet-to-dry dressing changes been ambulating with surgical shoe.  He denies any other acute complaints.   Review of Systems: Negative except as noted in the HPI. Denies N/V/F/Ch.  Past Medical History:  Diagnosis Date  . Diabetes mellitus without complication ([place]) 8185  . Hypertension 2008  . Personal history of colonic polyps    TA polyp  . Sleep apnea    not using cpap    Current Outpatient Medications:  .  atorvastatin (LIPITOR) 40 MG tablet, Take 40 mg by mouth daily at 6 PM. , Disp: , Rfl:  .  collagenase (SANTYL) ointment, Apply 1 application topically daily., Disp: 30 g, Rfl: 1 .  doxycycline (VIBRA-TABS) 100 MG tablet, Take 1 tablet (100 mg total) by mouth 2 (two) times daily., Disp: 20 tablet, Rfl: 0 .  glimepiride (AMARYL) 4 MG tablet, Take 2 tablets (8 mg total) by mouth 2 (two) times daily. (Patient taking differently: Take 4 mg by mouth 2 (two) times daily. ), Disp: 60 tablet, Rfl: 3 .  insulin detemir (LEVEMIR) 100 UNIT/ML injection, Inject 50 Units into the skin at bedtime. , Disp: , Rfl:  .  lisinopril-hydrochlorothiazide (PRINZIDE,ZESTORETIC) 20-12.5 MG tablet, Take 1 tablet every morning by mouth. , Disp: , Rfl:  .  metoprolol succinate (TOPROL-XL) 25 MG 24 hr tablet, Take 25 mg every morning by mouth. , Disp: , Rfl:  .  multivitamin-lutein (OCUVITE-LUTEIN) CAPS capsule, Take 1 capsule by mouth 2 (two) times daily., Disp: , Rfl:  .  NOVOLOG MIX 70/30 FLEXPEN (70-30) 100 UNIT/ML FlexPen, Inject 24 Units into the skin 2 (two) times daily with a meal. , Disp: , Rfl:  .  oxyCODONE-acetaminophen (PERCOCET) 7.5-325 MG tablet, Take 1 tablet by mouth 5 (five) times  daily., Disp: , Rfl:  .  pregabalin (LYRICA) 75 MG capsule, Take 75 mg by mouth 2 (two) times daily. , Disp: , Rfl:   Social History   Tobacco Use  Smoking Status Former Smoker  . Packs/day: 1.00  . Years: 30.00  . Pack years: 30.00  . Types: Cigarettes  . Quit date: 02/03/2007  . Years since quitting: 12.8  Smokeless Tobacco Never Used    No Known Allergies Objective:  There were no vitals filed for this visit. There is no height or weight on file to calculate BMI. Constitutional Well developed. Well nourished.  Vascular Dorsalis pedis pulses palpable bilaterally. Posterior tibial pulses palpable bilaterally. Capillary refill normal to all digits.  No cyanosis or clubbing noted. Pedal hair growth normal.  Neurologic Normal speech. Oriented to person, place, and time. Protective sensation absent  Dermatologic Wound Location: Left plantar midfoot with fat layer exposed.  Does not probe down to bone.  No clinical signs of infection noted. Wound Base: Mixed Granular/Fibrotic Peri-wound: Macerated Exudate: Scant/small amount Serosanguinous exudate Wound Measurements: -See below  Orthopedic: No pain to palpation either foot.   Radiographs: None Assessment:   1. Type 2 diabetes mellitus without complication, with long-term current use of insulin ([place])   2. Left midfoot ulcer, with fat layer exposed ([place])    Plan:  Patient was evaluated and treated and all questions answered.  Ulcer  left plantar midfoot with fat layer exposed -Debridement as below. -Dressed with Santyl wet-to-dry, DSD. -Continue off-loading with surgical shoe.  Procedure: Excisional Debridement of Wound Tool: Sharp chisel blade/tissue nipper Rationale: Removal of non-viable soft tissue from the wound to promote healing.  Anesthesia: none Pre-Debridement Wound Measurements: 1 cm x 1 cm x 0.3 cm Post-Debridement Wound Measurements: 1.1 cm x 1.2 cm x 0.3 Type of Debridement: Sharp Excisional Tissue  Removed: Non-viable soft tissue Blood loss: Minimal (<50cc) Depth of Debridement: subcutaneous tissue. Technique: Sharp excisional debridement to bleeding, viable wound base.  Wound Progress: The wound is decreasing in size.  The depth is about the same. Dressing: Dry, sterile, compression dressing. Disposition: Patient tolerated procedure well. Patient to return in 1 week for follow-up.  No follow-ups on file.

## 2019-12-27 ENCOUNTER — Encounter: Payer: Self-pay | Admitting: *Deleted

## 2019-12-27 ENCOUNTER — Encounter: Payer: Self-pay | Admitting: Podiatry

## 2019-12-27 ENCOUNTER — Ambulatory Visit (INDEPENDENT_AMBULATORY_CARE_PROVIDER_SITE_OTHER): Payer: BC Managed Care – PPO | Admitting: Podiatry

## 2019-12-27 ENCOUNTER — Other Ambulatory Visit: Payer: Self-pay

## 2019-12-27 DIAGNOSIS — E119 Type 2 diabetes mellitus without complications: Secondary | ICD-10-CM

## 2019-12-27 DIAGNOSIS — L97422 Non-pressure chronic ulcer of left heel and midfoot with fat layer exposed: Secondary | ICD-10-CM | POA: Diagnosis not present

## 2019-12-27 DIAGNOSIS — Z794 Long term (current) use of insulin: Secondary | ICD-10-CM

## 2019-12-27 NOTE — Progress Notes (Signed)
Subjective:  Patient ID: Christopher Escobar, male    DOB: [birth year],  MRN: [id number]  Chief Complaint  Patient presents with  . Foot Ulcer    "its almost healed.  It looks really good"    64 y.o. male presents for wound care.  Patient presents with left plantar midfoot wound.  Patient states he has been doing Santyl wet-to-dry dressing changes been ambulating with surgical shoe.  He denies any other acute complaints.   Review of Systems: Negative except as noted in the HPI. Denies N/V/F/Ch.  Past Medical History:  Diagnosis Date  . Diabetes mellitus without complication ([place]) 8250  . Hypertension 2008  . Personal history of colonic polyps    TA polyp  . Sleep apnea    not using cpap    Current Outpatient Medications:  .  atorvastatin (LIPITOR) 40 MG tablet, Take 40 mg by mouth daily at 6 PM. , Disp: , Rfl:  .  collagenase (SANTYL) ointment, Apply 1 application topically daily., Disp: 30 g, Rfl: 1 .  doxycycline (VIBRA-TABS) 100 MG tablet, Take 1 tablet (100 mg total) by mouth 2 (two) times daily., Disp: 20 tablet, Rfl: 0 .  glimepiride (AMARYL) 4 MG tablet, Take 2 tablets (8 mg total) by mouth 2 (two) times daily. (Patient taking differently: Take 4 mg by mouth 2 (two) times daily. ), Disp: 60 tablet, Rfl: 3 .  insulin detemir (LEVEMIR) 100 UNIT/ML injection, Inject 50 Units into the skin at bedtime. , Disp: , Rfl:  .  lisinopril-hydrochlorothiazide (PRINZIDE,ZESTORETIC) 20-12.5 MG tablet, Take 1 tablet every morning by mouth. , Disp: , Rfl:  .  metoprolol succinate (TOPROL-XL) 25 MG 24 hr tablet, Take 25 mg every morning by mouth. , Disp: , Rfl:  .  multivitamin-lutein (OCUVITE-LUTEIN) CAPS capsule, Take 1 capsule by mouth 2 (two) times daily., Disp: , Rfl:  .  NOVOLOG MIX 70/30 FLEXPEN (70-30) 100 UNIT/ML FlexPen, Inject 24 Units into the skin 2 (two) times daily with a meal. , Disp: , Rfl:  .  oxyCODONE-acetaminophen (PERCOCET) 7.5-325 MG tablet, Take 1 tablet by mouth 5 (five)  times daily., Disp: , Rfl:  .  pregabalin (LYRICA) 75 MG capsule, Take 75 mg by mouth 2 (two) times daily. , Disp: , Rfl:   Social History   Tobacco Use  Smoking Status Former Smoker  . Packs/day: 1.00  . Years: 30.00  . Pack years: 30.00  . Types: Cigarettes  . Quit date: 02/03/2007  . Years since quitting: 12.9  Smokeless Tobacco Never Used    No Known Allergies Objective:  There were no vitals filed for this visit. There is no height or weight on file to calculate BMI. Constitutional Well developed. Well nourished.  Vascular Dorsalis pedis pulses palpable bilaterally. Posterior tibial pulses palpable bilaterally. Capillary refill normal to all digits.  No cyanosis or clubbing noted. Pedal hair growth normal.  Neurologic Normal speech. Oriented to person, place, and time. Protective sensation absent  Dermatologic  completely reepithelialized to the left plantar midfoot ulceration.  Orthopedic: No pain to palpation either foot.   Radiographs: None Assessment:   1. Type 2 diabetes mellitus without complication, with long-term current use of insulin ([place])   2. Left midfoot ulcer, with fat layer exposed ([place])    Plan:  Patient was evaluated and treated and all questions answered.  Ulcer left plantar midfoot with fat layer exposed -Clinically wound has healed.  Completely reepithelialized.  I instructed him to go back into his diabetic shoes once  he obtains them.  He is scheduled to receive his diabetic shoes from [employer]. -He can return to work without any restrictions once he is in the diabetic shoes. -If any foot and ankle issues arise in the future I have asked to come back and see me.  Patient states understanding No follow-ups on file.

## 2019-12-28 ENCOUNTER — Other Ambulatory Visit: Payer: Self-pay

## 2019-12-28 ENCOUNTER — Ambulatory Visit (INDEPENDENT_AMBULATORY_CARE_PROVIDER_SITE_OTHER): Payer: BC Managed Care – PPO | Admitting: Orthotics

## 2019-12-28 DIAGNOSIS — L97422 Non-pressure chronic ulcer of left heel and midfoot with fat layer exposed: Secondary | ICD-10-CM | POA: Diagnosis not present

## 2019-12-28 DIAGNOSIS — Z89421 Acquired absence of other right toe(s): Secondary | ICD-10-CM

## 2019-12-28 DIAGNOSIS — Z794 Long term (current) use of insulin: Secondary | ICD-10-CM

## 2019-12-28 DIAGNOSIS — S90822A Blister (nonthermal), left foot, initial encounter: Secondary | ICD-10-CM

## 2019-12-28 DIAGNOSIS — E119 Type 2 diabetes mellitus without complications: Secondary | ICD-10-CM | POA: Diagnosis not present

## 2019-12-28 NOTE — Progress Notes (Signed)

## 2020-01-04 DIAGNOSIS — M79676 Pain in unspecified toe(s): Secondary | ICD-10-CM

## 2020-01-12 ENCOUNTER — Encounter: Payer: Self-pay | Admitting: *Deleted

## 2020-01-19 ENCOUNTER — Encounter: Payer: Self-pay | Admitting: *Deleted

## 2020-01-19 NOTE — Progress Notes (Signed)
ENTERED IN ERROR

## 2020-01-19 NOTE — Progress Notes (Signed)
Christopher Escobar came in today and picked up his 2nd pair of Diabetics Shoes.  He had previously paid for the shoes on 12/28/2019.  Wearing instructions were given. The patient signed the dispensing chart note.

## 2020-04-03 ENCOUNTER — Encounter: Payer: BC Managed Care – PPO | Admitting: Podiatry

## 2020-07-26 ENCOUNTER — Ambulatory Visit (INDEPENDENT_AMBULATORY_CARE_PROVIDER_SITE_OTHER): Payer: BC Managed Care – PPO | Admitting: Podiatry

## 2020-07-26 ENCOUNTER — Encounter: Payer: Self-pay | Admitting: Podiatry

## 2020-07-26 ENCOUNTER — Other Ambulatory Visit: Payer: Self-pay

## 2020-07-26 DIAGNOSIS — Z794 Long term (current) use of insulin: Secondary | ICD-10-CM | POA: Diagnosis not present

## 2020-07-26 DIAGNOSIS — L97422 Non-pressure chronic ulcer of left heel and midfoot with fat layer exposed: Secondary | ICD-10-CM | POA: Diagnosis not present

## 2020-07-26 DIAGNOSIS — E119 Type 2 diabetes mellitus without complications: Secondary | ICD-10-CM

## 2020-07-26 MED ORDER — SANTYL 250 UNIT/GM EX OINT: 1.0000 "application " | TOPICAL_OINTMENT | Freq: Every day | CUTANEOUS | 1 refills | Status: DC

## 2020-07-31 ENCOUNTER — Encounter: Payer: Self-pay | Admitting: Podiatry

## 2020-07-31 ENCOUNTER — Ambulatory Visit: Payer: BC Managed Care – PPO | Admitting: Podiatry

## 2020-07-31 NOTE — Progress Notes (Signed)
Subjective:  Patient ID: Christopher Escobar, male    DOB: 06-05-1955,  MRN: [id number]  Chief Complaint  Patient presents with  . Diabetic Ulcer    Patient presents today for new wound on bottom arch of left foot x 1-2 weeks    65 y.o. male presents for wound care.  Patient presents with a complaint of left midfoot wound that has been going on for few weeks.  Patient had tried local wound care but has not helped.  He decided to come see me to get further advanced wound care.  He states it opened back up it came out of nowhere.  It has progressed to gotten worse.  He has already [place] and started applying to it.  He denies any other acute complaints.  He is placed himself back in the surgical shoe.  He would like to discuss treatment options   Review of Systems: Negative except as noted in the HPI. Denies N/V/F/Ch.  Past Medical History:  Diagnosis Date  . Diabetes mellitus without complication ([place]) 9937  . Hypertension 2008  . Personal history of colonic polyps    TA polyp  . Sleep apnea    not using cpap    Current Outpatient Medications:  .  empagliflozin (JARDIANCE) 10 MG TABS tablet, Take by mouth daily., Disp: , Rfl:  .  atorvastatin (LIPITOR) 40 MG tablet, Take 40 mg by mouth daily at 6 PM. , Disp: , Rfl:  .  collagenase (SANTYL) ointment, Apply 1 application topically daily., Disp: 30 g, Rfl: 1 .  glimepiride (AMARYL) 4 MG tablet, Take 2 tablets (8 mg total) by mouth 2 (two) times daily. (Patient taking differently: Take 4 mg by mouth 2 (two) times daily. ), Disp: 60 tablet, Rfl: 3 .  insulin detemir (LEVEMIR) 100 UNIT/ML injection, Inject 50 Units into the skin at bedtime. , Disp: , Rfl:  .  lisinopril-hydrochlorothiazide (PRINZIDE,ZESTORETIC) 20-12.5 MG tablet, Take 1 tablet every morning by mouth. , Disp: , Rfl:  .  metoprolol succinate (TOPROL-XL) 25 MG 24 hr tablet, Take 25 mg every morning by mouth. , Disp: , Rfl:  .  multivitamin-lutein (OCUVITE-LUTEIN) CAPS capsule,  Take 1 capsule by mouth 2 (two) times daily., Disp: , Rfl:  .  NOVOLOG MIX 70/30 FLEXPEN (70-30) 100 UNIT/ML FlexPen, Inject 24 Units into the skin 2 (two) times daily with a meal. , Disp: , Rfl:  .  oxyCODONE-acetaminophen (PERCOCET) 7.5-325 MG tablet, Take 1 tablet by mouth 5 (five) times daily., Disp: , Rfl:  .  pregabalin (LYRICA) 75 MG capsule, Take 75 mg by mouth 2 (two) times daily. , Disp: , Rfl:   Social History   Tobacco Use  Smoking Status Former Smoker  . Packs/day: 1.00  . Years: 30.00  . Pack years: 30.00  . Types: Cigarettes  . Quit date: 02/03/2007  . Years since quitting: 13.4  Smokeless Tobacco Never Used    No Known Allergies Objective:  There were no vitals filed for this visit. There is no height or weight on file to calculate BMI. Constitutional Well developed. Well nourished.  Vascular Dorsalis pedis pulses faintly palpable bilaterally. Posterior tibial pulses faintly palpable bilaterally. Capillary refill normal to all digits.  No cyanosis or clubbing noted. Pedal hair growth normal.  Neurologic Normal speech. Oriented to person, place, and time. Protective sensation absent  Dermatologic Wound Location: Left plantar midfoot wound with fat layer exposed Wound Base: Mixed Granular/Fibrotic Peri-wound: Macerated Exudate: Scant/small amount Serosanguinous exudate Wound Measurements: -See below  Orthopedic: No pain to palpation either foot.   Radiographs: None Assessment:   1. Type 2 diabetes mellitus without complication, with long-term current use of insulin ([place])   2. Left midfoot ulcer, with fat layer exposed ([place])    Plan:  Patient was evaluated and treated and all questions answered.  Ulcer left plantar midfoot with fat layer exposed -Debridement as below. -Dressed with Santyl wet-to-dry, DSD. -Continue off-loading with surgical shoe. -Given the recurrence of the wound as well as the depth of the wound I believe patient will benefit from  graft application as well.  We will work on preapproval for graft.  Procedure: Excisional Debridement of Wound Tool: Sharp chisel blade/tissue nipper Rationale: Removal of non-viable soft tissue from the wound to promote healing.  Anesthesia: none Pre-Debridement Wound Measurements: 2.5 cm x 2 cm x 0.3 cm  Post-Debridement Wound Measurements: 2.7 cm x 2.1 cm x 0.3 cm  Type of Debridement: Sharp Excisional Tissue Removed: Non-viable soft tissue Blood loss: Minimal (<50cc) Depth of Debridement: subcutaneous tissue. Technique: Sharp excisional debridement to bleeding, viable wound base.  Wound Progress: This is my initial evaluation we will continue monitor the progression of it. Site healing conversation 7 Dressing: Dry, sterile, compression dressing. Disposition: Patient tolerated procedure well. Patient to return in 1 week for follow-up.  No follow-ups on file.

## 2020-08-16 ENCOUNTER — Other Ambulatory Visit: Payer: Self-pay

## 2020-08-16 ENCOUNTER — Encounter: Payer: Self-pay | Admitting: Podiatry

## 2020-08-16 ENCOUNTER — Ambulatory Visit (INDEPENDENT_AMBULATORY_CARE_PROVIDER_SITE_OTHER): Payer: BC Managed Care – PPO | Admitting: Podiatry

## 2020-08-16 DIAGNOSIS — L97422 Non-pressure chronic ulcer of left heel and midfoot with fat layer exposed: Secondary | ICD-10-CM

## 2020-08-16 DIAGNOSIS — E11621 Type 2 diabetes mellitus with foot ulcer: Secondary | ICD-10-CM | POA: Diagnosis not present

## 2020-08-16 NOTE — Progress Notes (Signed)
Subjective:  Patient ID: Christopher Escobar, male    DOB: 06-Jan-1956,  MRN: [id number]  Chief Complaint  Patient presents with  . Wound Check    "Im concerned about an area above the wound.  The skin feels thin and I dont want it to get worse"    65 y.o. male presents for wound care.  Patient presents with a follow-up of left midfoot wound that has been pretty stagnant.  Patient is here to apply graft.   Review of Systems: Negative except as noted in the HPI. Denies N/V/F/Ch.  Past Medical History:  Diagnosis Date  . Diabetes mellitus without complication ([place]) 4098  . Hypertension 2008  . Personal history of colonic polyps    TA polyp  . Sleep apnea    not using cpap    Current Outpatient Medications:  .  atorvastatin (LIPITOR) 40 MG tablet, Take 40 mg by mouth daily at 6 PM. , Disp: , Rfl:  .  collagenase (SANTYL) ointment, Apply 1 application topically daily., Disp: 30 g, Rfl: 1 .  empagliflozin (JARDIANCE) 10 MG TABS tablet, Take by mouth daily., Disp: , Rfl:  .  glimepiride (AMARYL) 4 MG tablet, Take 2 tablets (8 mg total) by mouth 2 (two) times daily. (Patient taking differently: Take 4 mg by mouth 2 (two) times daily. ), Disp: 60 tablet, Rfl: 3 .  insulin detemir (LEVEMIR) 100 UNIT/ML injection, Inject 50 Units into the skin at bedtime. , Disp: , Rfl:  .  lisinopril-hydrochlorothiazide (PRINZIDE,ZESTORETIC) 20-12.5 MG tablet, Take 1 tablet every morning by mouth. , Disp: , Rfl:  .  metoprolol succinate (TOPROL-XL) 25 MG 24 hr tablet, Take 25 mg every morning by mouth. , Disp: , Rfl:  .  multivitamin-lutein (OCUVITE-LUTEIN) CAPS capsule, Take 1 capsule by mouth 2 (two) times daily., Disp: , Rfl:  .  NOVOLOG MIX 70/30 FLEXPEN (70-30) 100 UNIT/ML FlexPen, Inject 24 Units into the skin 2 (two) times daily with a meal. , Disp: , Rfl:  .  oxyCODONE-acetaminophen (PERCOCET) 7.5-325 MG tablet, Take 1 tablet by mouth 5 (five) times daily., Disp: , Rfl:  .  pregabalin (LYRICA) 75 MG  capsule, Take 75 mg by mouth 2 (two) times daily. , Disp: , Rfl:   Social History   Tobacco Use  Smoking Status Former Smoker  . Packs/day: 1.00  . Years: 30.00  . Pack years: 30.00  . Types: Cigarettes  . Quit date: 02/03/2007  . Years since quitting: 13.5  Smokeless Tobacco Never Used    No Known Allergies Objective:  There were no vitals filed for this visit. There is no height or weight on file to calculate BMI. Constitutional Well developed. Well nourished.  Vascular Dorsalis pedis pulses faintly palpable bilaterally. Posterior tibial pulses faintly palpable bilaterally. Capillary refill normal to all digits.  No cyanosis or clubbing noted. Pedal hair growth normal.  Neurologic Normal speech. Oriented to person, place, and time. Protective sensation absent  Dermatologic Wound Location: Left plantar midfoot wound with fat layer exposed Wound Base: Mixed Granular/Fibrotic Peri-wound: Macerated Exudate: Scant/small amount Serosanguinous exudate Wound Measurements: -See below  Orthopedic: No pain to palpation either foot.   Radiographs: None Assessment:   1. Diabetic ulcer of left midfoot associated with type 2 diabetes mellitus, with fat layer exposed ([place])    Plan:  Patient was evaluated and treated and all questions answered.  Ulcer left plantar midfoot with fat layer exposed -Debridement as below. -Dressed with Santyl wet-to-dry, DSD. -Continue off-loading with surgical shoe. -  Application of synthetic skin graft/substitute  Name: Organogenesis Puraply Lot [id number] 2022-09-25 Usage: 3.76 cm x 3.76 cm cm Graft was applied directly to the listed above wound site and secured with Adaptic, kerlix, Ace bandage. Waste: No graft material was wasted and was applied in its entirety.   Procedure: Excisional Debridement of Wound Tool: Sharp chisel blade/tissue nipper Rationale: Removal of non-viable soft tissue from the wound to promote healing.   Anesthesia: none Pre-Debridement Wound Measurements: 2.5 cm x 2 cm x 0.3 cm  Post-Debridement Wound Measurements: 2.7 cm x 2.1 cm x 0.3 cm  Type of Debridement: Sharp Excisional Tissue Removed: Non-viable soft tissue Blood loss: Minimal (<50cc) Depth of Debridement: subcutaneous tissue. Technique: Sharp excisional debridement to bleeding, viable wound base.  Wound Progress: This is my initial evaluation we will continue monitor the progression of it. Site healing conversation 7 Dressing: Dry, sterile, compression dressing. Disposition: Patient tolerated procedure well. Patient to return in 1 week for follow-up.  Return in about 1 week (around 08/23/2020).

## 2020-08-20 ENCOUNTER — Telehealth: Payer: Self-pay | Admitting: *Deleted

## 2020-08-20 NOTE — Telephone Encounter (Signed)
I do not think he is using the santyl now as it is being grafted it looks. However, the wound measurements are in the chart if you can send it over.

## 2020-08-20 NOTE — Telephone Encounter (Signed)
"  This is in regards to the Santyl 250 unit gram ointment.  It's missing the number of wounds, the size of each wound, and the days' supply.  Please give [place] a call back at your earliest convenience."

## 2020-08-23 ENCOUNTER — Other Ambulatory Visit: Payer: Self-pay

## 2020-08-23 ENCOUNTER — Ambulatory Visit: Payer: Medicare HMO | Admitting: Podiatry

## 2020-08-23 DIAGNOSIS — E11621 Type 2 diabetes mellitus with foot ulcer: Secondary | ICD-10-CM | POA: Diagnosis not present

## 2020-08-23 DIAGNOSIS — L97422 Non-pressure chronic ulcer of left heel and midfoot with fat layer exposed: Secondary | ICD-10-CM | POA: Diagnosis not present

## 2020-08-24 ENCOUNTER — Encounter: Payer: Self-pay | Admitting: Podiatry

## 2020-08-24 NOTE — Progress Notes (Signed)
Subjective:  Patient ID: Christopher Escobar, male    DOB: [birth year],  MRN: [id number]  Chief Complaint  Patient presents with  . Wound Check    Wound care     65 y.o. male presents for wound care.  Patient presents with a follow-up of left midfoot wound that seems to be decreasing.  Patient is here to apply graft.   Review of Systems: Negative except as noted in the HPI. Denies N/V/F/Ch.  Past Medical History:  Diagnosis Date  . Diabetes mellitus without complication ([place]) 9326  . Hypertension 2008  . Personal history of colonic polyps    TA polyp  . Sleep apnea    not using cpap    Current Outpatient Medications:  .  atorvastatin (LIPITOR) 40 MG tablet, Take 40 mg by mouth daily at 6 PM. , Disp: , Rfl:  .  collagenase (SANTYL) ointment, Apply 1 application topically daily., Disp: 30 g, Rfl: 1 .  empagliflozin (JARDIANCE) 10 MG TABS tablet, Take by mouth daily., Disp: , Rfl:  .  glimepiride (AMARYL) 4 MG tablet, Take 2 tablets (8 mg total) by mouth 2 (two) times daily. (Patient taking differently: Take 4 mg by mouth 2 (two) times daily. ), Disp: 60 tablet, Rfl: 3 .  insulin detemir (LEVEMIR) 100 UNIT/ML injection, Inject 50 Units into the skin at bedtime. , Disp: , Rfl:  .  lisinopril-hydrochlorothiazide (PRINZIDE,ZESTORETIC) 20-12.5 MG tablet, Take 1 tablet every morning by mouth. , Disp: , Rfl:  .  metoprolol succinate (TOPROL-XL) 25 MG 24 hr tablet, Take 25 mg every morning by mouth. , Disp: , Rfl:  .  multivitamin-lutein (OCUVITE-LUTEIN) CAPS capsule, Take 1 capsule by mouth 2 (two) times daily., Disp: , Rfl:  .  NOVOLOG MIX 70/30 FLEXPEN (70-30) 100 UNIT/ML FlexPen, Inject 24 Units into the skin 2 (two) times daily with a meal. , Disp: , Rfl:  .  oxyCODONE-acetaminophen (PERCOCET) 7.5-325 MG tablet, Take 1 tablet by mouth 5 (five) times daily., Disp: , Rfl:  .  pregabalin (LYRICA) 75 MG capsule, Take 75 mg by mouth 2 (two) times daily. , Disp: , Rfl:   Social History    Tobacco Use  Smoking Status Former Smoker  . Packs/day: 1.00  . Years: 30.00  . Pack years: 30.00  . Types: Cigarettes  . Quit date: 02/03/2007  . Years since quitting: 13.5  Smokeless Tobacco Never Used    No Known Allergies Objective:  There were no vitals filed for this visit. There is no height or weight on file to calculate BMI. Constitutional Well developed. Well nourished.  Vascular Dorsalis pedis pulses faintly palpable bilaterally. Posterior tibial pulses faintly palpable bilaterally. Capillary refill normal to all digits.  No cyanosis or clubbing noted. Pedal hair growth normal.  Neurologic Normal speech. Oriented to person, place, and time. Protective sensation absent  Dermatologic Wound Location: Left plantar midfoot wound with fat layer exposed Wound Base: Mixed Granular/Fibrotic Peri-wound: Macerated Exudate: Scant/small amount Serosanguinous exudate Wound Measurements: -See below  Orthopedic: No pain to palpation either foot.   Radiographs: None Assessment:   1. Diabetic ulcer of left midfoot associated with type 2 diabetes mellitus, with fat layer exposed ([place])    Plan:  Patient was evaluated and treated and all questions answered.  Ulcer left plantar midfoot with fat layer exposed -Debridement as below. -Dressed with Santyl wet-to-dry, DSD. -Continue off-loading with surgical shoe.  -Application of synthetic skin graft/substitute  Name: Organogenesis Puraply Lot [id number] 2022-09-25 Usage: 3.76 cm x 3.76  cm cm Graft was applied directly to the listed above wound site and secured with Adaptic, kerlix, Ace bandage. Waste: No graft material was wasted and was applied in its entirety.   Procedure: Excisional Debridement of Wound Tool: Sharp chisel blade/tissue nipper Rationale: Removal of non-viable soft tissue from the wound to promote healing.  Anesthesia: none Pre-Debridement Wound Measurements: 2.0 cm x 1.5 cm x 0.3 cm   Post-Debridement Wound Measurements: 2.0 cm x 1.5 cm x 0.3 cm  Type of Debridement: Sharp Excisional Tissue Removed: Non-viable soft tissue Blood loss: Minimal (<50cc) Depth of Debridement: subcutaneous tissue. Technique: Sharp excisional debridement to bleeding, viable wound base.  Wound Progress: The wound appears to be decreasing with application of synthetic skin graft Site healing conversation 7 Dressing: Dry, sterile, compression dressing. Disposition: Patient tolerated procedure well. Patient to return in 1 week for follow-up.  No follow-ups on file.

## 2020-08-30 ENCOUNTER — Encounter: Payer: Self-pay | Admitting: Podiatry

## 2020-08-30 ENCOUNTER — Other Ambulatory Visit: Payer: Self-pay

## 2020-08-30 ENCOUNTER — Ambulatory Visit: Payer: Medicare HMO | Admitting: Podiatry

## 2020-08-30 DIAGNOSIS — L97422 Non-pressure chronic ulcer of left heel and midfoot with fat layer exposed: Secondary | ICD-10-CM

## 2020-08-30 DIAGNOSIS — E11621 Type 2 diabetes mellitus with foot ulcer: Secondary | ICD-10-CM

## 2020-08-30 NOTE — Telephone Encounter (Signed)
He doesn't need it anymore

## 2020-08-30 NOTE — Telephone Encounter (Signed)
I called the measurements in to the pharmacy and verified the quantity.

## 2020-08-30 NOTE — Progress Notes (Signed)
Subjective:  Patient ID: Christopher Escobar, male    DOB: 01-18-1956,  MRN: [id number]  Chief Complaint  Patient presents with  . Wound Check    65 y.o. male presents for wound care.  Patient presents with a follow-up of left midfoot wound that seems to be decreasing.  Patient is here to apply graft.   Review of Systems: Negative except as noted in the HPI. Denies N/V/F/Ch.  Past Medical History:  Diagnosis Date  . Diabetes mellitus without complication ([place]) 4235  . Hypertension 2008  . Personal history of colonic polyps    TA polyp  . Sleep apnea    not using cpap    Current Outpatient Medications:  .  atorvastatin (LIPITOR) 40 MG tablet, Take 40 mg by mouth daily at 6 PM. , Disp: , Rfl:  .  collagenase (SANTYL) ointment, Apply 1 application topically daily., Disp: 30 g, Rfl: 1 .  empagliflozin (JARDIANCE) 10 MG TABS tablet, Take by mouth daily., Disp: , Rfl:  .  glimepiride (AMARYL) 4 MG tablet, Take 2 tablets (8 mg total) by mouth 2 (two) times daily. (Patient taking differently: Take 4 mg by mouth 2 (two) times daily. ), Disp: 60 tablet, Rfl: 3 .  insulin detemir (LEVEMIR) 100 UNIT/ML injection, Inject 50 Units into the skin at bedtime. , Disp: , Rfl:  .  lisinopril-hydrochlorothiazide (PRINZIDE,ZESTORETIC) 20-12.5 MG tablet, Take 1 tablet every morning by mouth. , Disp: , Rfl:  .  metoprolol succinate (TOPROL-XL) 25 MG 24 hr tablet, Take 25 mg every morning by mouth. , Disp: , Rfl:  .  multivitamin-lutein (OCUVITE-LUTEIN) CAPS capsule, Take 1 capsule by mouth 2 (two) times daily., Disp: , Rfl:  .  NOVOLOG MIX 70/30 FLEXPEN (70-30) 100 UNIT/ML FlexPen, Inject 24 Units into the skin 2 (two) times daily with a meal. , Disp: , Rfl:  .  oxyCODONE-acetaminophen (PERCOCET) 7.5-325 MG tablet, Take 1 tablet by mouth 5 (five) times daily., Disp: , Rfl:  .  pregabalin (LYRICA) 75 MG capsule, Take 75 mg by mouth 2 (two) times daily. , Disp: , Rfl:   Social History   Tobacco Use   Smoking Status Former Smoker  . Packs/day: 1.00  . Years: 30.00  . Pack years: 30.00  . Types: Cigarettes  . Quit date: 02/03/2007  . Years since quitting: 13.5  Smokeless Tobacco Never Used    No Known Allergies Objective:  There were no vitals filed for this visit. There is no height or weight on file to calculate BMI. Constitutional Well developed. Well nourished.  Vascular Dorsalis pedis pulses faintly palpable bilaterally. Posterior tibial pulses faintly palpable bilaterally. Capillary refill normal to all digits.  No cyanosis or clubbing noted. Pedal hair growth normal.  Neurologic Normal speech. Oriented to person, place, and time. Protective sensation absent  Dermatologic Wound Location: Left plantar midfoot wound with fat layer exposed Wound Base: Mixed Granular/Fibrotic Peri-wound: Macerated Exudate: Scant/small amount Serosanguinous exudate Wound Measurements: -See below  Orthopedic: No pain to palpation either foot.   Radiographs: None Assessment:   1. Diabetic ulcer of left midfoot associated with type 2 diabetes mellitus, with fat layer exposed ([place])    Plan:  Patient was evaluated and treated and all questions answered.  Ulcer left plantar midfoot with fat layer exposed -Debridement as below. -Dressed with graf, DSD. -Continue off-loading with surgical shoe.  -Application of synthetic skin graft/substitute  Name: Organogenesis Puraply Lot [id number] 2022-09-25 Usage: 3.76 cm x 3.76 cm cm Graft was applied directly to  the listed above wound site and secured with Adaptic, kerlix, Ace bandage. Waste: No graft material was wasted and was applied in its entirety.   Procedure: Excisional Debridement of Wound Tool: Sharp chisel blade/tissue nipper Rationale: Removal of non-viable soft tissue from the wound to promote healing.  Anesthesia: none Pre-Debridement Wound Measurements: 1.5 cm x 1.0 cm x 0.3 cm  Post-Debridement Wound Measurements:  1.5 cm x 1.0 cm x 0.3 cm  Type of Debridement: Sharp Excisional Tissue Removed: Non-viable soft tissue Blood loss: Minimal (<50cc) Depth of Debridement: subcutaneous tissue. Technique: Sharp excisional debridement to bleeding, viable wound base.  Wound Progress: The wound appears to be decreasing with application of synthetic skin graft Site healing conversation 7 Dressing: Dry, sterile, compression dressing. Disposition: Patient tolerated procedure well. Patient to return in 1 week for follow-up.  No follow-ups on file.

## 2020-09-06 ENCOUNTER — Other Ambulatory Visit: Payer: Self-pay

## 2020-09-06 ENCOUNTER — Ambulatory Visit: Payer: Medicare HMO | Admitting: Podiatry

## 2020-09-06 DIAGNOSIS — L97422 Non-pressure chronic ulcer of left heel and midfoot with fat layer exposed: Secondary | ICD-10-CM | POA: Diagnosis not present

## 2020-09-06 DIAGNOSIS — E11621 Type 2 diabetes mellitus with foot ulcer: Secondary | ICD-10-CM

## 2020-09-12 ENCOUNTER — Encounter: Payer: Self-pay | Admitting: Podiatry

## 2020-09-12 NOTE — Progress Notes (Signed)
Subjective:  Patient ID: Christopher Escobar, male    DOB: 04/02/1955,  MRN: [id number]  Chief Complaint  Patient presents with   Wound Check    PT stated that he is doing well he has no concerns at this time     65 y.o. male presents for wound care.  Patient presents with a follow-up of left midfoot wound that seems to be decreasing.  Patient is here to apply graft.   Review of Systems: Negative except as noted in the HPI. Denies N/V/F/Ch.  Past Medical History:  Diagnosis Date   Diabetes mellitus without complication ([place]) 3875   Hypertension 2008   Personal history of colonic polyps    TA polyp   Sleep apnea    not using cpap    Current Outpatient Medications:    atorvastatin (LIPITOR) 40 MG tablet, Take 40 mg by mouth daily at 6 PM. , Disp: , Rfl:    collagenase (SANTYL) ointment, Apply 1 application topically daily., Disp: 30 g, Rfl: 1   empagliflozin (JARDIANCE) 10 MG TABS tablet, Take by mouth daily., Disp: , Rfl:    glimepiride (AMARYL) 4 MG tablet, Take 2 tablets (8 mg total) by mouth 2 (two) times daily. (Patient taking differently: Take 4 mg by mouth 2 (two) times daily. ), Disp: 60 tablet, Rfl: 3   insulin detemir (LEVEMIR) 100 UNIT/ML injection, Inject 50 Units into the skin at bedtime. , Disp: , Rfl:    lisinopril-hydrochlorothiazide (PRINZIDE,ZESTORETIC) 20-12.5 MG tablet, Take 1 tablet every morning by mouth. , Disp: , Rfl:    metoprolol succinate (TOPROL-XL) 25 MG 24 hr tablet, Take 25 mg every morning by mouth. , Disp: , Rfl:    multivitamin-lutein (OCUVITE-LUTEIN) CAPS capsule, Take 1 capsule by mouth 2 (two) times daily., Disp: , Rfl:    NOVOLOG MIX 70/30 FLEXPEN (70-30) 100 UNIT/ML FlexPen, Inject 24 Units into the skin 2 (two) times daily with a meal. , Disp: , Rfl:    oxyCODONE-acetaminophen (PERCOCET) 7.5-325 MG tablet, Take 1 tablet by mouth 5 (five) times daily., Disp: , Rfl:    pregabalin (LYRICA) 75 MG capsule, Take 75 mg by mouth 2 (two) times daily. ,  Disp: , Rfl:   Social History   Tobacco Use  Smoking Status Former   Packs/day: 1.00   Years: 30.00   Pack years: 30.00   Types: Cigarettes   Quit date: 02/03/2007   Years since quitting: 13.6  Smokeless Tobacco Never    No Known Allergies Objective:  There were no vitals filed for this visit. There is no height or weight on file to calculate BMI. Constitutional Well developed. Well nourished.  Vascular Dorsalis pedis pulses faintly palpable bilaterally. Posterior tibial pulses faintly palpable bilaterally. Capillary refill normal to all digits.  No cyanosis or clubbing noted. Pedal hair growth normal.  Neurologic Normal speech. Oriented to person, place, and time. Protective sensation absent  Dermatologic Wound Location: Left plantar midfoot wound with fat layer exposed Wound Base: Mixed Granular/Fibrotic Peri-wound: Macerated Exudate: Scant/small amount Serosanguinous exudate Wound Measurements: -See below  Orthopedic: No pain to palpation either foot.   Radiographs: None Assessment:   1. Diabetic ulcer of left midfoot associated with type 2 diabetes mellitus, with fat layer exposed ([place])     Plan:  Patient was evaluated and treated and all questions answered.  Ulcer left plantar midfoot with fat layer exposed -Debridement as below. -Dressed with graf, DSD. -Continue off-loading with surgical shoe.  -Application of synthetic skin graft/substitute  Name:  Organogenesis Puraply Lot [id number] 2022-09-25 Usage: 3.76 cm x 3.76 cm cm Graft was applied directly to the listed above wound site and secured with Adaptic, kerlix, Ace bandage. Waste: No graft material was wasted and was applied in its entirety.   Procedure: Excisional Debridement of Wound Tool: Sharp chisel blade/tissue nipper Rationale: Removal of non-viable soft tissue from the wound to promote healing.  Anesthesia: none Pre-Debridement Wound Measurements: 1.2 cm x 0.8 cm x 0.3 cm   Post-Debridement Wound Measurements: 1.2 cm x 0.8 cm x 0.3 cm  Type of Debridement: Sharp Excisional Tissue Removed: Non-viable soft tissue Blood loss: Minimal (<50cc) Depth of Debridement: subcutaneous tissue. Technique: Sharp excisional debridement to bleeding, viable wound base.  Wound Progress: The wound appears to be decreasing with application of synthetic skin graft Site healing conversation 7 Dressing: Dry, sterile, compression dressing. Disposition: Patient tolerated procedure well. Patient to return in 1 week for follow-up.  No follow-ups on file.

## 2020-09-13 ENCOUNTER — Ambulatory Visit: Payer: Medicare HMO | Admitting: Podiatry

## 2020-09-13 ENCOUNTER — Encounter: Payer: Self-pay | Admitting: Podiatry

## 2020-09-13 ENCOUNTER — Other Ambulatory Visit: Payer: Self-pay

## 2020-09-13 DIAGNOSIS — L97422 Non-pressure chronic ulcer of left heel and midfoot with fat layer exposed: Secondary | ICD-10-CM

## 2020-09-13 DIAGNOSIS — E11621 Type 2 diabetes mellitus with foot ulcer: Secondary | ICD-10-CM

## 2020-09-18 ENCOUNTER — Encounter: Payer: Self-pay | Admitting: Podiatry

## 2020-09-18 NOTE — Progress Notes (Signed)
Subjective:  Patient ID: Christopher Escobar, male    DOB: [birth year],  MRN: [id number]  Chief Complaint  Patient presents with   Wound Check    "Its doing good"    65 y.o. male presents for wound care.  Patient presents with a follow-up of left midfoot wound that seems to be decreasing.  Patient is here to apply graft.   Review of Systems: Negative except as noted in the HPI. Denies N/V/F/Ch.  Past Medical History:  Diagnosis Date   Diabetes mellitus without complication ([place]) 2297   Hypertension 2008   Personal history of colonic polyps    TA polyp   Sleep apnea    not using cpap    Current Outpatient Medications:    atorvastatin (LIPITOR) 40 MG tablet, Take 40 mg by mouth daily at 6 PM. , Disp: , Rfl:    collagenase (SANTYL) ointment, Apply 1 application topically daily., Disp: 30 g, Rfl: 1   empagliflozin (JARDIANCE) 10 MG TABS tablet, Take by mouth daily., Disp: , Rfl:    glimepiride (AMARYL) 4 MG tablet, Take 2 tablets (8 mg total) by mouth 2 (two) times daily. (Patient taking differently: Take 4 mg by mouth 2 (two) times daily. ), Disp: 60 tablet, Rfl: 3   insulin detemir (LEVEMIR) 100 UNIT/ML injection, Inject 50 Units into the skin at bedtime. , Disp: , Rfl:    lisinopril-hydrochlorothiazide (PRINZIDE,ZESTORETIC) 20-12.5 MG tablet, Take 1 tablet every morning by mouth. , Disp: , Rfl:    metoprolol succinate (TOPROL-XL) 25 MG 24 hr tablet, Take 25 mg every morning by mouth. , Disp: , Rfl:    multivitamin-lutein (OCUVITE-LUTEIN) CAPS capsule, Take 1 capsule by mouth 2 (two) times daily., Disp: , Rfl:    NOVOLOG MIX 70/30 FLEXPEN (70-30) 100 UNIT/ML FlexPen, Inject 24 Units into the skin 2 (two) times daily with a meal. , Disp: , Rfl:    oxyCODONE-acetaminophen (PERCOCET) 7.5-325 MG tablet, Take 1 tablet by mouth 5 (five) times daily., Disp: , Rfl:    pregabalin (LYRICA) 75 MG capsule, Take 75 mg by mouth 2 (two) times daily. , Disp: , Rfl:   Social History   Tobacco Use   Smoking Status Former   Packs/day: 1.00   Years: 30.00   Pack years: 30.00   Types: Cigarettes   Quit date: 02/03/2007   Years since quitting: 13.6  Smokeless Tobacco Never    No Known Allergies Objective:  There were no vitals filed for this visit. There is no height or weight on file to calculate BMI. Constitutional Well developed. Well nourished.  Vascular Dorsalis pedis pulses faintly palpable bilaterally. Posterior tibial pulses faintly palpable bilaterally. Capillary refill normal to all digits.  No cyanosis or clubbing noted. Pedal hair growth normal.  Neurologic Normal speech. Oriented to person, place, and time. Protective sensation absent  Dermatologic Wound Location: Left plantar midfoot wound with fat layer exposed Wound Base: Mixed Granular/Fibrotic Peri-wound: Macerated Exudate: Scant/small amount Serosanguinous exudate Wound Measurements: -See below  Orthopedic: No pain to palpation either foot.   Radiographs: None Assessment:   1. Diabetic ulcer of left midfoot associated with type 2 diabetes mellitus, with fat layer exposed ([place])      Plan:  Patient was evaluated and treated and all questions answered.  Ulcer left plantar midfoot with fat layer exposed -Debridement as below. -Dressed with graf, DSD. -Continue off-loading with surgical shoe.  -Application of synthetic skin graft/substitute  Name: Organogenesis Puraply Lot [id number] 2022-10-01 Usage: 3.76 cm x 3.76 cm  cm Graft was applied directly to the listed above wound site and secured with Adaptic, kerlix, Ace bandage. Waste: No graft material was wasted and was applied in its entirety.   Procedure: Excisional Debridement of Wound Tool: Sharp chisel blade/tissue nipper Rationale: Removal of non-viable soft tissue from the wound to promote healing.  Anesthesia: none Pre-Debridement Wound Measurements: 0.4 cm x 0.4cm x 0.1 cm  Post-Debridement Wound Measurements: 0.4 cm x 0.4cm x  0.1 cm  Type of Debridement: Sharp Excisional Tissue Removed: Non-viable soft tissue Blood loss: Minimal (<50cc) Depth of Debridement: subcutaneous tissue. Technique: Sharp excisional debridement to bleeding, viable wound base.  Wound Progress: The wound appears to be decreasing with application of synthetic skin graft  Dressing: Dry, sterile, compression dressing. Disposition: Patient tolerated procedure well. Patient to return in 1 week for follow-up.  No follow-ups on file.

## 2020-09-20 ENCOUNTER — Ambulatory Visit: Payer: Medicare HMO | Admitting: Podiatry

## 2020-09-20 ENCOUNTER — Encounter: Payer: Self-pay | Admitting: Podiatry

## 2020-09-20 ENCOUNTER — Other Ambulatory Visit: Payer: Self-pay

## 2020-09-20 DIAGNOSIS — E119 Type 2 diabetes mellitus without complications: Secondary | ICD-10-CM | POA: Diagnosis not present

## 2020-09-20 DIAGNOSIS — L97422 Non-pressure chronic ulcer of left heel and midfoot with fat layer exposed: Secondary | ICD-10-CM | POA: Diagnosis not present

## 2020-09-20 DIAGNOSIS — E11621 Type 2 diabetes mellitus with foot ulcer: Secondary | ICD-10-CM | POA: Diagnosis not present

## 2020-09-20 DIAGNOSIS — Z794 Long term (current) use of insulin: Secondary | ICD-10-CM | POA: Diagnosis not present

## 2020-09-20 NOTE — Progress Notes (Signed)
Subjective:  Patient ID: Christopher Escobar, male    DOB: [birth year],  MRN: [id number]  Chief Complaint  Patient presents with   Wound Check    Wound check     65 y.o. male presents for wound care.  Patient presents with a follow-up of multiple application of grafting to the wound.  Patient states is finally healed he denies any acute complaints.   Review of Systems: Negative except as noted in the HPI. Denies N/V/F/Ch.  Past Medical History:  Diagnosis Date   Diabetes mellitus without complication ([place]) 4097   Hypertension 2008   Personal history of colonic polyps    TA polyp   Sleep apnea    not using cpap    Current Outpatient Medications:    atorvastatin (LIPITOR) 40 MG tablet, Take 40 mg by mouth daily at 6 PM. , Disp: , Rfl:    collagenase (SANTYL) ointment, Apply 1 application topically daily., Disp: 30 g, Rfl: 1   empagliflozin (JARDIANCE) 10 MG TABS tablet, Take by mouth daily., Disp: , Rfl:    glimepiride (AMARYL) 4 MG tablet, Take 2 tablets (8 mg total) by mouth 2 (two) times daily. (Patient taking differently: Take 4 mg by mouth 2 (two) times daily. ), Disp: 60 tablet, Rfl: 3   insulin detemir (LEVEMIR) 100 UNIT/ML injection, Inject 50 Units into the skin at bedtime. , Disp: , Rfl:    lisinopril-hydrochlorothiazide (PRINZIDE,ZESTORETIC) 20-12.5 MG tablet, Take 1 tablet every morning by mouth. , Disp: , Rfl:    metoprolol succinate (TOPROL-XL) 25 MG 24 hr tablet, Take 25 mg every morning by mouth. , Disp: , Rfl:    multivitamin-lutein (OCUVITE-LUTEIN) CAPS capsule, Take 1 capsule by mouth 2 (two) times daily., Disp: , Rfl:    NOVOLOG MIX 70/30 FLEXPEN (70-30) 100 UNIT/ML FlexPen, Inject 24 Units into the skin 2 (two) times daily with a meal. , Disp: , Rfl:    oxyCODONE-acetaminophen (PERCOCET) 7.5-325 MG tablet, Take 1 tablet by mouth 5 (five) times daily., Disp: , Rfl:    pregabalin (LYRICA) 75 MG capsule, Take 75 mg by mouth 2 (two) times daily. , Disp: , Rfl:    Social History   Tobacco Use  Smoking Status Former   Packs/day: 1.00   Years: 30.00   Pack years: 30.00   Types: Cigarettes   Quit date: 02/03/2007   Years since quitting: 13.6  Smokeless Tobacco Never    No Known Allergies Objective:  There were no vitals filed for this visit. There is no height or weight on file to calculate BMI. Constitutional Well developed. Well nourished.  Vascular Dorsalis pedis pulses faintly palpable bilaterally. Posterior tibial pulses faintly palpable bilaterally. Capillary refill normal to all digits.  No cyanosis or clubbing noted. Pedal hair growth normal.  Neurologic Normal speech. Oriented to person, place, and time. Protective sensation absent  Dermatologic Left plantar midfoot wound completely epithelialized.  No further wound noted.  No clinical signs of infection noted.  Orthopedic: No pain to palpation either foot.   Radiographs: None Assessment:   1. Diabetic ulcer of left midfoot associated with type 2 diabetes mellitus, with fat layer exposed ([place])   2. Type 2 diabetes mellitus without complication, with long-term current use of insulin ([place])       Plan:  Patient was evaluated and treated and all questions answered.  Ulcer left plantar midfoot with fat layer exposed -Clinically healed.  At this time the wound has completely epithelialized.  No further wounds noted upon debridement.  I discussed shoe gear modification offloading in extensive detail he states understanding if any foot and ankle issues arise in future come back and see me. No follow-ups on file.

## 2020-10-12 ENCOUNTER — Ambulatory Visit (INDEPENDENT_AMBULATORY_CARE_PROVIDER_SITE_OTHER): Payer: Medicare HMO

## 2020-10-12 ENCOUNTER — Ambulatory Visit: Payer: Medicare HMO | Admitting: Podiatry

## 2020-10-12 ENCOUNTER — Other Ambulatory Visit: Payer: Self-pay

## 2020-10-12 DIAGNOSIS — L97522 Non-pressure chronic ulcer of other part of left foot with fat layer exposed: Secondary | ICD-10-CM

## 2020-10-12 DIAGNOSIS — L03032 Cellulitis of left toe: Secondary | ICD-10-CM | POA: Diagnosis not present

## 2020-10-12 DIAGNOSIS — E08621 Diabetes mellitus due to underlying condition with foot ulcer: Secondary | ICD-10-CM

## 2020-10-12 MED ORDER — DOXYCYCLINE HYCLATE 100 MG PO TABS
100.0000 mg | ORAL_TABLET | Freq: Two times a day (BID) | ORAL | 0 refills | Status: DC
Start: 2020-10-12 — End: 2021-01-05

## 2020-10-12 MED ORDER — GENTAMICIN SULFATE 0.1 % EX CREA: 1.0000 "application " | TOPICAL_CREAM | Freq: Three times a day (TID) | CUTANEOUS | 0 refills | Status: DC

## 2020-10-12 NOTE — Progress Notes (Signed)
   Subjective:  65 y.o. male with PMHx of diabetes mellitus presenting today for evaluation of a new complaint regarding an ulcer that developed over the last few days.  Patient states that he developed a blister on his left fifth toe secondary to some shoes that he wore.  Patient noticed over the last few days that the redness and swelling increased.  He is also noticed drainage.  He presents for further treatment and evaluation   Past Medical History:  Diagnosis Date   Diabetes mellitus without complication ([place]) 3888   Hypertension 2008   Personal history of colonic polyps    TA polyp   Sleep apnea    not using cpap       Objective/Physical Exam General: The patient is alert and oriented x3 in no acute distress.  Dermatology:  Wound #1 noted to the left fifth toe.  After debridement the wound measures approximately 2.0 x 1.5 x 0.2 cm (LxWxD).   To the noted ulceration(s), there is no eschar. There is a moderate amount of slough, fibrin, and necrotic tissue noted. Granulation tissue and wound base is red. There is a minimal amount of serosanguineous drainage noted. There is no exposed bone muscle-tendon ligament or joint. There is no malodor. Periwound integrity is intact. Skin is warm, dry and supple bilateral lower extremities.  The nail plate to the fifth toe is also loosely adhered and there is apparent underlying drainage and serous fluid causing detachment of the superficial layers of the skin and integument  Vascular: Palpable pedal pulses bilaterally. No edema or erythema noted. Capillary refill within normal limits.  Neurological: Epicritic and protective threshold diminished bilaterally.   Musculoskeletal Exam: Range of motion within normal limits to all pedal and ankle joints bilateral. Muscle strength 5/5 in all groups bilateral.  Radiographic exam: Spurring noted at the phalanges of the left fifth toe.  After close evaluation I do not believe this is suspicious for any  osteolytic process.  Cortices appear intact.  Assessment: 1.  Ulcer left fifth toe secondary to diabetes mellitus 2. diabetes mellitus w/ peripheral neuropathy 3.  Cellulitis left fifth toe   Plan of Care:  1. Patient was evaluated. 2. medically necessary excisional debridement including muscle and deep fascial tissue was performed using a tissue nipper and a chisel blade. Excisional debridement of all the necrotic nonviable tissue down to healthy bleeding viable tissue was performed with post-debridement measurements same as pre-.  Complete removal of the left fifth toenail was also performed during debridement.  Again, the nail plate was loosely adhered with subungual fluid. 3. the wound was cleansed and dry sterile dressing applied. 4.  Cultures taken and sent to pathology for culture and sensitivity 5.  Prescription for doxycycline 100 mg 2 times daily #20 6.  Prescription for gentamicin cream applied daily 7.  Continue postsurgical shoe. 8.  Patient is to return to clinic in 2 weeks.   [name], DPM [facility]  Dr. [name], DPM    [address],  [address]                Office [phone]  Fax [phone]

## 2020-10-19 LAB — WOUND CULTURE

## 2020-10-26 ENCOUNTER — Ambulatory Visit: Payer: Medicare HMO | Admitting: Podiatry

## 2020-10-26 ENCOUNTER — Other Ambulatory Visit: Payer: Self-pay

## 2020-10-26 DIAGNOSIS — L03032 Cellulitis of left toe: Secondary | ICD-10-CM | POA: Diagnosis not present

## 2020-10-26 DIAGNOSIS — L97522 Non-pressure chronic ulcer of other part of left foot with fat layer exposed: Secondary | ICD-10-CM | POA: Diagnosis not present

## 2020-10-26 DIAGNOSIS — E08621 Diabetes mellitus due to underlying condition with foot ulcer: Secondary | ICD-10-CM

## 2020-10-26 NOTE — Progress Notes (Signed)
   HPI: 65 y.o. male presenting today for follow-up evaluation of an ulcer that developed to the patient's left fifth toe.  He was last seen in the office on 10/12/2020 at that time antibiotics were prescribed and cultures were taken.  He has been applying the gentamicin cream.  Today he presents in regular shoes and he says there is significant improvement.  No new complaints at this time  Past Medical History:  Diagnosis Date   Diabetes mellitus without complication ([place]) [id number]   Hypertension 2008   Personal history of colonic polyps    TA polyp   Sleep apnea    not using cpap     Physical Exam: General: The patient is alert and oriented x3 in no acute distress.  Dermatology: Skin is warm, dry and supple bilateral lower extremities. Negative for open lesions or macerations.  It appears that the ulcer to the left fifth toe is healed completely.  There is no erythema or edema noted to the toe.  Vascular: Palpable pedal pulses bilaterally. No edema or erythema noted. Capillary refill within normal limits.  Neurological: Epicritic and protective threshold grossly intact bilaterally.   Musculoskeletal Exam: History of right toe amputation   Assessment: 1.  Ulcer left fifth toe secondary to diabetes mellitus; healed 2.  Diabetes mellitus with peripheral polyneuropathy 3.  Cellulitis left fifth toe; resolved   Plan of Care:  1. Patient evaluated.  Cultures reviewed.  2.  The wound to the left fifth toe has healed and the cellulitis is completely resolved.  Resume diabetic shoes and insoles 3.  Return to clinic as needed      [name], DPM [facility]  Dr. [name], DPM    [address], [place] [address]                Office [phone]  Fax [phone]

## 2020-11-16 ENCOUNTER — Other Ambulatory Visit: Payer: Self-pay

## 2020-11-16 ENCOUNTER — Ambulatory Visit: Payer: Medicare HMO | Admitting: Podiatry

## 2020-11-16 DIAGNOSIS — L97412 Non-pressure chronic ulcer of right heel and midfoot with fat layer exposed: Secondary | ICD-10-CM

## 2020-11-16 DIAGNOSIS — L97422 Non-pressure chronic ulcer of left heel and midfoot with fat layer exposed: Secondary | ICD-10-CM | POA: Diagnosis not present

## 2020-11-16 DIAGNOSIS — E11621 Type 2 diabetes mellitus with foot ulcer: Secondary | ICD-10-CM | POA: Diagnosis not present

## 2020-11-16 NOTE — Progress Notes (Signed)
   Subjective:  65 y.o. male with PMHx of diabetes mellitus presenting today with a new complaint regarding blisters that developed to the plantar aspect of the bilateral feet.  Patient states that he noticed blisters developing a few days prior.  He presents as an urgent work in today.  He denies a history of injury.  He believes that they may have come from his outside work shoes.   Past Medical History:  Diagnosis Date   Diabetes mellitus without complication ([place]) [id number]   Hypertension 2008   Personal history of colonic polyps    TA polyp   Sleep apnea    not using cpap       Objective/Physical Exam General: The patient is alert and oriented x3 in no acute distress.  Dermatology:  Wound #1 noted to the right foot measuring 1.5 x 1.5 x 0.1 cm (LxWxD).   Wound #2 noted to the left foot measuring approximately 4.5 x 3.5 x 0.1 cm.  To the noted ulcerations, the wound bases appear healthy and granular.  The left foot wound slightly extends through the dermis into the deeper tissues.  They appear very stable and the periwound is intact.  No clinical evidence of infection.  No malodor.  No purulence.  Serous drainage noted.  Vascular: Palpable pedal pulses bilaterally. No edema or erythema noted. Capillary refill within normal limits.  Neurological: Epicritic and protective threshold diminished bilaterally.   Musculoskeletal Exam: No pedal deformity  Assessment: 1.  Ulcers bilateral feet secondary to diabetes mellitus 2. diabetes mellitus w/ peripheral neuropathy   Plan of Care:  1. Patient was evaluated. 2.  The blisters were deroofed and cleansed today.  Medically necessary excisional debridement including subcutaneous tissue was performed using a tissue nipper and a chisel blade. Excisional debridement of all the necrotic nonviable tissue down to healthy bleeding viable tissue was performed with post-debridement measurements same as pre-. 3. the wound was cleansed and dry  sterile dressing applied. 4.  Silvadene cream applied and provided for the patient to apply daily 5.  Continue postsurgical shoes daily  6.  Return to clinic in 2 weeks   [name], DPM [facility]  Dr. [name], DPM    [address], [place] [address]                Office [phone]  Fax [phone]

## 2020-11-29 ENCOUNTER — Ambulatory Visit: Payer: Medicare HMO | Admitting: Podiatry

## 2020-11-29 ENCOUNTER — Other Ambulatory Visit: Payer: Self-pay

## 2020-11-29 DIAGNOSIS — L97422 Non-pressure chronic ulcer of left heel and midfoot with fat layer exposed: Secondary | ICD-10-CM

## 2020-11-29 DIAGNOSIS — E11621 Type 2 diabetes mellitus with foot ulcer: Secondary | ICD-10-CM | POA: Diagnosis not present

## 2020-12-04 ENCOUNTER — Ambulatory Visit: Payer: Medicare HMO | Admitting: Podiatry

## 2020-12-04 ENCOUNTER — Encounter: Payer: Self-pay | Admitting: Podiatry

## 2020-12-04 NOTE — Progress Notes (Signed)
Subjective:  Patient ID: Christopher Escobar, male    DOB: 07/31/1955,  MRN: [id number]  Chief Complaint  Patient presents with   Diabetic Ulcer    BILATERAL ULCERS     65 y.o. male presents for wound care.  Patient presents with complaint of bilateral midfoot ulceration with fat layer exposed.  Patient states that it was a blister and then opened up into an ulceration.  He has been dealing with this for last few days.  He came out of nowhere.  He wanted to get evaluated.  He is diabetic.  He denies any other acute complaints.  This is a new ulceration.   Review of Systems: Negative except as noted in the HPI. Denies N/V/F/Ch.  Past Medical History:  Diagnosis Date   Diabetes mellitus without complication ([place]) [id number]   Hypertension 2008   Personal history of colonic polyps    TA polyp   Sleep apnea    not using cpap    Current Outpatient Medications:    atorvastatin (LIPITOR) 40 MG tablet, Take 40 mg by mouth daily at 6 PM. , Disp: , Rfl:    collagenase (SANTYL) ointment, Apply 1 application topically daily., Disp: 30 g, Rfl: 1   doxycycline (VIBRA-TABS) 100 MG tablet, Take 1 tablet (100 mg total) by mouth 2 (two) times daily., Disp: 20 tablet, Rfl: 0   empagliflozin (JARDIANCE) 10 MG TABS tablet, Take by mouth daily., Disp: , Rfl:    gentamicin cream (GARAMYCIN) 0.1 %, Apply 1 application topically 3 (three) times daily., Disp: 15 g, Rfl: 0   glimepiride (AMARYL) 4 MG tablet, Take 2 tablets (8 mg total) by mouth 2 (two) times daily. (Patient taking differently: Take 4 mg by mouth 2 (two) times daily. ), Disp: 60 tablet, Rfl: 3   insulin detemir (LEVEMIR) 100 UNIT/ML injection, Inject 50 Units into the skin at bedtime. , Disp: , Rfl:    lisinopril-hydrochlorothiazide (PRINZIDE,ZESTORETIC) 20-12.5 MG tablet, Take 1 tablet every morning by mouth. , Disp: , Rfl:    metoprolol succinate (TOPROL-XL) 25 MG 24 hr tablet, Take 25 mg every morning by mouth. , Disp: , Rfl:    multivitamin-lutein  (OCUVITE-LUTEIN) CAPS capsule, Take 1 capsule by mouth 2 (two) times daily., Disp: , Rfl:    NOVOLOG MIX 70/30 FLEXPEN (70-30) 100 UNIT/ML FlexPen, Inject 24 Units into the skin 2 (two) times daily with a meal. , Disp: , Rfl:    oxyCODONE-acetaminophen (PERCOCET) 7.5-325 MG tablet, Take 1 tablet by mouth 5 (five) times daily., Disp: , Rfl:    pregabalin (LYRICA) 75 MG capsule, Take 75 mg by mouth 2 (two) times daily. , Disp: , Rfl:   Social History   Tobacco Use  Smoking Status Former   Packs/day: 1.00   Years: 30.00   Pack years: 30.00   Types: Cigarettes   Quit date: 02/03/2007   Years since quitting: 13.8  Smokeless Tobacco Never    No Known Allergies Objective:  There were no vitals filed for this visit. There is no height or weight on file to calculate BMI. Constitutional Well developed. Well nourished.  Vascular Dorsalis pedis pulses palpable bilaterally. Posterior tibial pulses palpable bilaterally. Capillary refill normal to all digits.  No cyanosis or clubbing noted. Pedal hair growth normal.  Neurologic Normal speech. Oriented to person, place, and time. Protective sensation absent  Dermatologic Wound Location: Bilateral plantar midfoot with fat layer exposed.  No clinical signs of infection noted.  No purulent drainage noted. Wound Base: Mixed Granular/Fibrotic  Peri-wound: Normal Exudate: Scant/small amount Serosanguinous exudate Wound Measurements: -See below  Orthopedic: No pain to palpation either foot.   Radiographs: None Assessment:   1. Diabetic ulcer of left midfoot associated with type 2 diabetes mellitus, with fat layer exposed ([place])   2. Diabetic ulcer of right midfoot associated with type 2 diabetes mellitus, with fat layer exposed ([place])    Plan:  Patient was evaluated and treated and all questions answered.  Ulcer bilateral midfoot ulceration with fat layer exposed (the right -Debridement as below. -Dressed with Betadine wet-to-dry,  DSD. -Continue off-loading with surgical shoe.  Procedure: Excisional Debridement of Wound right side Tool: Sharp chisel blade/tissue nipper Rationale: Removal of non-viable soft tissue from the wound to promote healing.  Anesthesia: none Pre-Debridement Wound Measurements: 1.5 cm x 1 cm x 0.3 cm  Post-Debridement Wound Measurements: 1.7 cm x 1.2 cm x 0.3 cm  Type of Debridement: Sharp Excisional Tissue Removed: Non-viable soft tissue Blood loss: Minimal (<50cc) Depth of Debridement: subcutaneous tissue. Technique: Sharp excisional debridement to bleeding, viable wound base.  Wound Progress: This my initial evaluation I will continue monitor the progression of the wound Site healing conversation 7 Dressing: Dry, sterile, compression dressing. Disposition: Patient tolerated procedure well. Patient to return in 1 week for follow-up.   Procedure: Excisional Debridement of Wound left side Tool: Sharp chisel blade/tissue nipper Rationale: Removal of non-viable soft tissue from the wound to promote healing.  Anesthesia: none Pre-Debridement Wound Measurements: 1 cm x 0.8 cm x 0.4 cm  Post-Debridement Wound Measurements: 1.1 cm x 0.9 cm x 0.4 cm  Type of Debridement: Sharp Excisional Tissue Removed: Non-viable soft tissue Blood loss: Minimal (<50cc) Depth of Debridement: subcutaneous tissue. Technique: Sharp excisional debridement to bleeding, viable wound base.  Wound Progress: This my initial evaluation I will continue monitor the progression of the wound. Site healing conversation 7 Dressing: Dry, sterile, compression dressing. Disposition: Patient tolerated procedure well. Patient to return in 1 week for follow-up. No follow-ups on file.

## 2020-12-20 ENCOUNTER — Encounter: Payer: Self-pay | Admitting: Podiatry

## 2020-12-20 ENCOUNTER — Ambulatory Visit: Payer: Medicare HMO | Admitting: Podiatry

## 2020-12-20 ENCOUNTER — Other Ambulatory Visit: Payer: Self-pay

## 2020-12-20 DIAGNOSIS — L97422 Non-pressure chronic ulcer of left heel and midfoot with fat layer exposed: Secondary | ICD-10-CM

## 2020-12-20 DIAGNOSIS — L97412 Non-pressure chronic ulcer of right heel and midfoot with fat layer exposed: Secondary | ICD-10-CM

## 2020-12-20 DIAGNOSIS — E11621 Type 2 diabetes mellitus with foot ulcer: Secondary | ICD-10-CM | POA: Diagnosis not present

## 2020-12-20 NOTE — Progress Notes (Signed)
Subjective:  Patient ID: Christopher Escobar, male    DOB: 11/16/1955,  MRN: [id number]  Chief Complaint  Patient presents with   Wound Check    "The right one is doing.  The left one is giving me a problem.  It's been really sore."    65 y.o. male presents for wound care.  Patient presents with follow-up of bilateral midfoot ulceration with fat layer exposed.  He states is doing little bit better has been doing Santyl wet-to-dry dressing on it there is little bit of maceration present.  The wound has gone down a little bit.  He is feeling a little bit of soreness.   Review of Systems: Negative except as noted in the HPI. Denies N/V/F/Ch.  Past Medical History:  Diagnosis Date   Diabetes mellitus without complication ([place]) 1751   Hypertension 2008   Personal history of colonic polyps    TA polyp   Sleep apnea    not using cpap    Current Outpatient Medications:    atorvastatin (LIPITOR) 40 MG tablet, Take 40 mg by mouth daily at 6 PM. , Disp: , Rfl:    collagenase (SANTYL) ointment, Apply 1 application topically daily., Disp: 30 g, Rfl: 1   doxycycline (VIBRA-TABS) 100 MG tablet, Take 1 tablet (100 mg total) by mouth 2 (two) times daily., Disp: 20 tablet, Rfl: 0   empagliflozin (JARDIANCE) 10 MG TABS tablet, Take by mouth daily., Disp: , Rfl:    gentamicin cream (GARAMYCIN) 0.1 %, Apply 1 application topically 3 (three) times daily., Disp: 15 g, Rfl: 0   glimepiride (AMARYL) 4 MG tablet, Take 2 tablets (8 mg total) by mouth 2 (two) times daily. (Patient taking differently: Take 4 mg by mouth 2 (two) times daily. ), Disp: 60 tablet, Rfl: 3   insulin detemir (LEVEMIR) 100 UNIT/ML injection, Inject 50 Units into the skin at bedtime. , Disp: , Rfl:    lisinopril-hydrochlorothiazide (PRINZIDE,ZESTORETIC) 20-12.5 MG tablet, Take 1 tablet every morning by mouth. , Disp: , Rfl:    metoprolol succinate (TOPROL-XL) 25 MG 24 hr tablet, Take 25 mg every morning by mouth. , Disp: , Rfl:     multivitamin-lutein (OCUVITE-LUTEIN) CAPS capsule, Take 1 capsule by mouth 2 (two) times daily., Disp: , Rfl:    NOVOLOG MIX 70/30 FLEXPEN (70-30) 100 UNIT/ML FlexPen, Inject 24 Units into the skin 2 (two) times daily with a meal. , Disp: , Rfl:    oxyCODONE-acetaminophen (PERCOCET) 7.5-325 MG tablet, Take 1 tablet by mouth 5 (five) times daily., Disp: , Rfl:    pregabalin (LYRICA) 75 MG capsule, Take 75 mg by mouth 2 (two) times daily. , Disp: , Rfl:   Social History   Tobacco Use  Smoking Status Former   Packs/day: 1.00   Years: 30.00   Pack years: 30.00   Types: Cigarettes   Quit date: 02/03/2007   Years since quitting: 13.8  Smokeless Tobacco Never    No Known Allergies Objective:  There were no vitals filed for this visit. There is no height or weight on file to calculate BMI. Constitutional Well developed. Well nourished.  Vascular Dorsalis pedis pulses palpable bilaterally. Posterior tibial pulses palpable bilaterally. Capillary refill normal to all digits.  No cyanosis or clubbing noted. Pedal hair growth normal.  Neurologic Normal speech. Oriented to person, place, and time. Protective sensation absent  Dermatologic Wound Location: Bilateral plantar midfoot with fat layer exposed.  No clinical signs of infection noted.  No purulent drainage noted. Wound Base:  Mixed Granular/Fibrotic Peri-wound: Normal Exudate: Scant/small amount Serosanguinous exudate Wound Measurements: -See below  Orthopedic: No pain to palpation either foot.   Radiographs: None Assessment:   1. Diabetic ulcer of left midfoot associated with type 2 diabetes mellitus, with fat layer exposed ([place])   2. Diabetic ulcer of right midfoot associated with type 2 diabetes mellitus, with fat layer exposed ([place])     Plan:  Patient was evaluated and treated and all questions answered.  Ulcer bilateral midfoot ulceration with fat layer exposed (the right -Debridement as below. -Dressed with Betadine  wet-to-dry, DSD. -Continue off-loading with surgical shoe.  Procedure: Excisional Debridement of Wound right side Tool: Sharp chisel blade/tissue nipper Rationale: Removal of non-viable soft tissue from the wound to promote healing.  Anesthesia: none Pre-Debridement Wound Measurements: 1.3 cm x 1 cm x 0.3 cm  Post-Debridement Wound Measurements: 1.5 cm x 1.2 cm x 0.3 cm  Type of Debridement: Sharp Excisional Tissue Removed: Non-viable soft tissue Blood loss: Minimal (<50cc) Depth of Debridement: subcutaneous tissue. Technique: Sharp excisional debridement to bleeding, viable wound base.  Wound Progress: Wound has decreased slightly Dressing: Dry, sterile, compression dressing. Disposition: Patient tolerated procedure well. Patient to return in 1 week for follow-up.   Procedure: Excisional Debridement of Wound left side Tool: Sharp chisel blade/tissue nipper Rationale: Removal of non-viable soft tissue from the wound to promote healing.  Anesthesia: none Pre-Debridement Wound Measurements: 0.8 cm x 0.8 cm x 0.4 cm  Post-Debridement Wound Measurements: 1.0 cm x 0.9 cm x 0.4 cm  Type of Debridement: Sharp Excisional Tissue Removed: Non-viable soft tissue Blood loss: Minimal (<50cc) Depth of Debridement: subcutaneous tissue. Technique: Sharp excisional debridement to bleeding, viable wound base.  Wound Progress: Wound has decreased slightly Dressing: Dry, sterile, compression dressing. Disposition: Patient tolerated procedure well. Patient to return in 1 week for follow-up. No follow-ups on file.

## 2021-01-05 ENCOUNTER — Other Ambulatory Visit: Payer: Self-pay

## 2021-01-05 ENCOUNTER — Encounter: Payer: Self-pay | Admitting: Emergency Medicine

## 2021-01-05 ENCOUNTER — Ambulatory Visit
Admission: EM | Admit: 2021-01-05 | Discharge: 2021-01-05 | Disposition: A | Discharge status: Home / Self Care | Payer: Medicare HMO | Attending: Emergency Medicine | Admitting: Emergency Medicine

## 2021-01-05 ENCOUNTER — Ambulatory Visit (INDEPENDENT_AMBULATORY_CARE_PROVIDER_SITE_OTHER): Payer: Medicare HMO

## 2021-01-05 DIAGNOSIS — L97521 Non-pressure chronic ulcer of other part of left foot limited to breakdown of skin: Secondary | ICD-10-CM | POA: Diagnosis not present

## 2021-01-05 DIAGNOSIS — E11621 Type 2 diabetes mellitus with foot ulcer: Secondary | ICD-10-CM | POA: Diagnosis not present

## 2021-01-05 IMAGING — CR DG FOOT COMPLETE 3+V*L*
3 series · 3 of 3 positions shown · non-contrast
Comparison: [DATE].

CLINICAL DATA: 65-year-old male with diabetic foot ulcers. Left 5th
toe redness, drainage from the 5th MCP.

EXAM:
LEFT FOOT - COMPLETE 3+ VIEW

[foot ap]
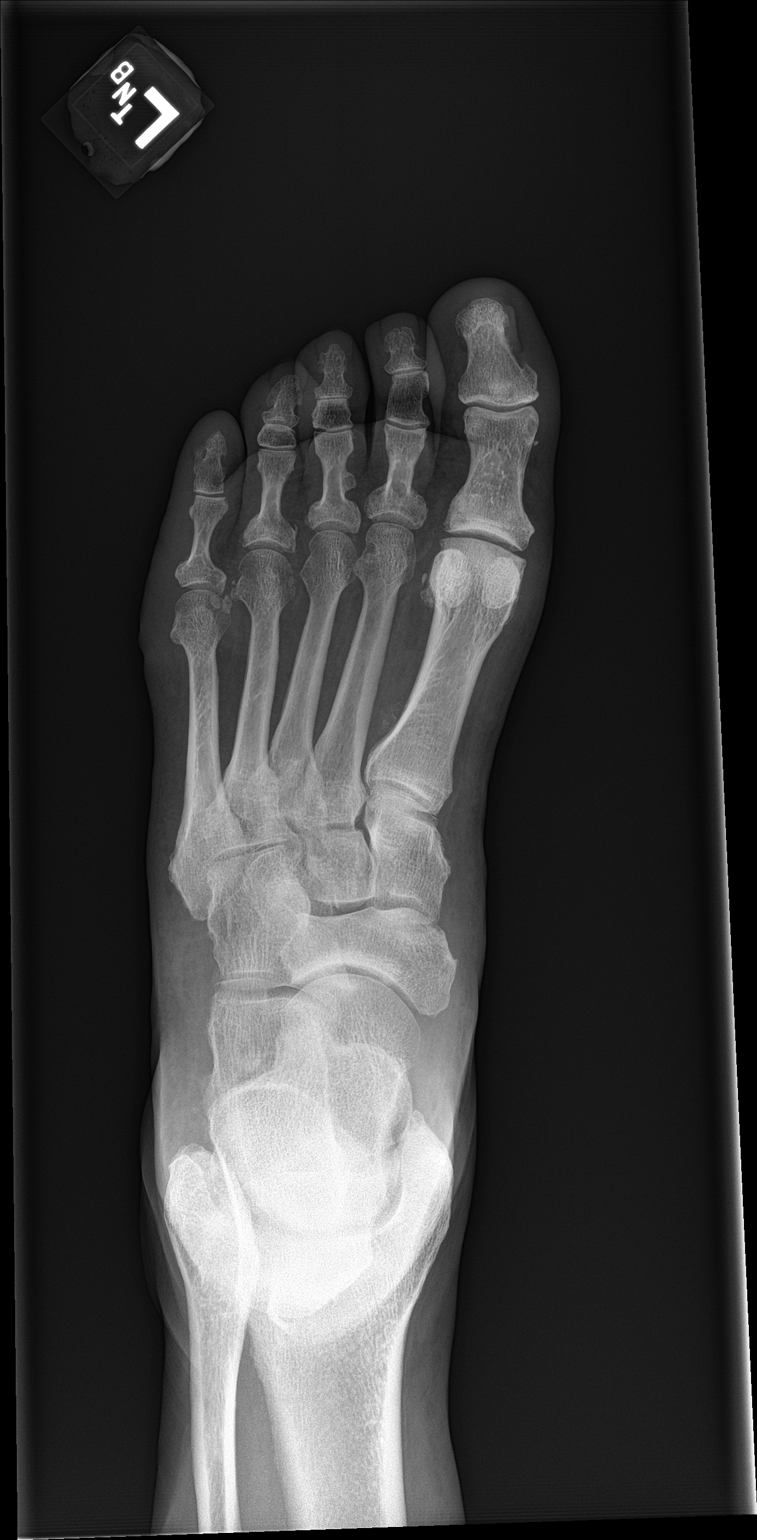

[foot obl]
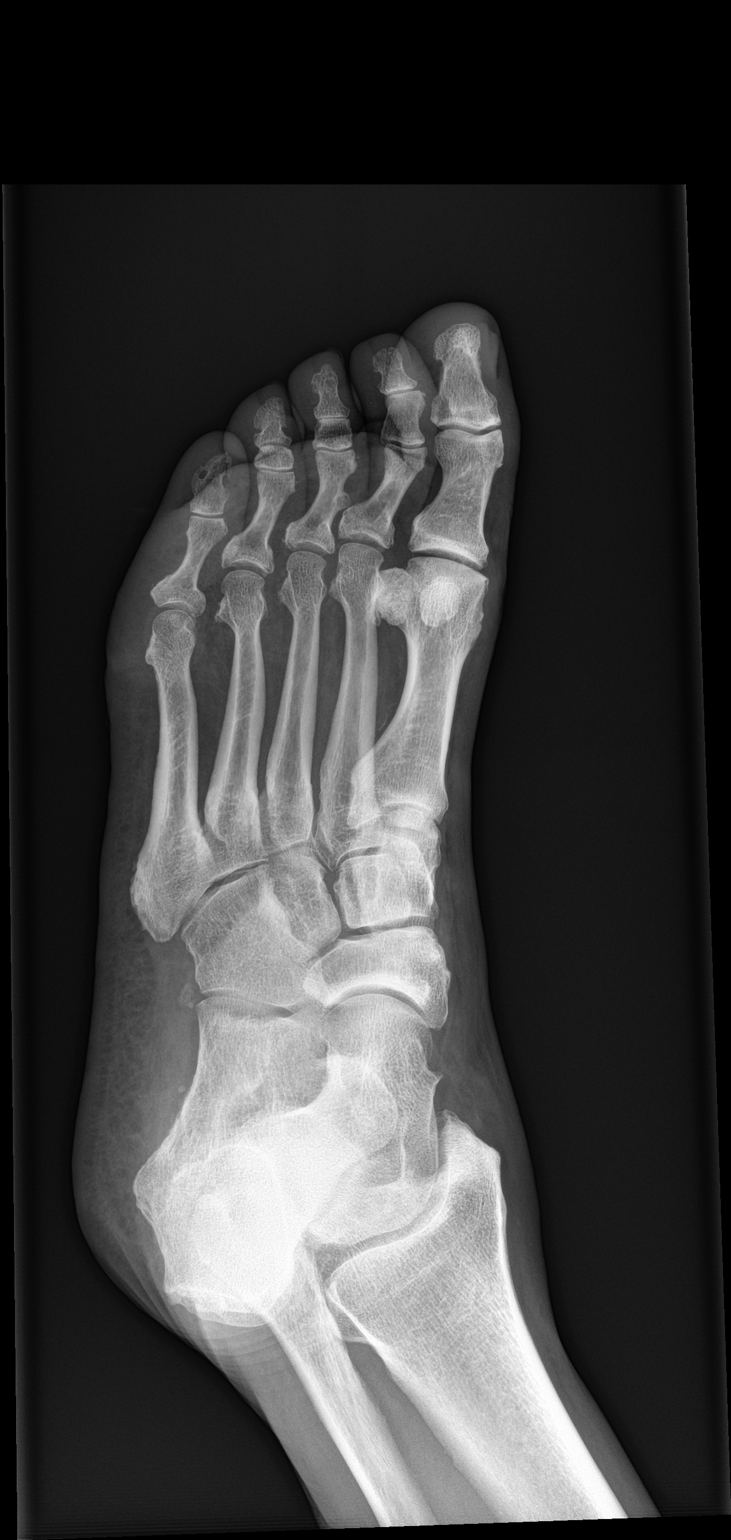

[foot lat]
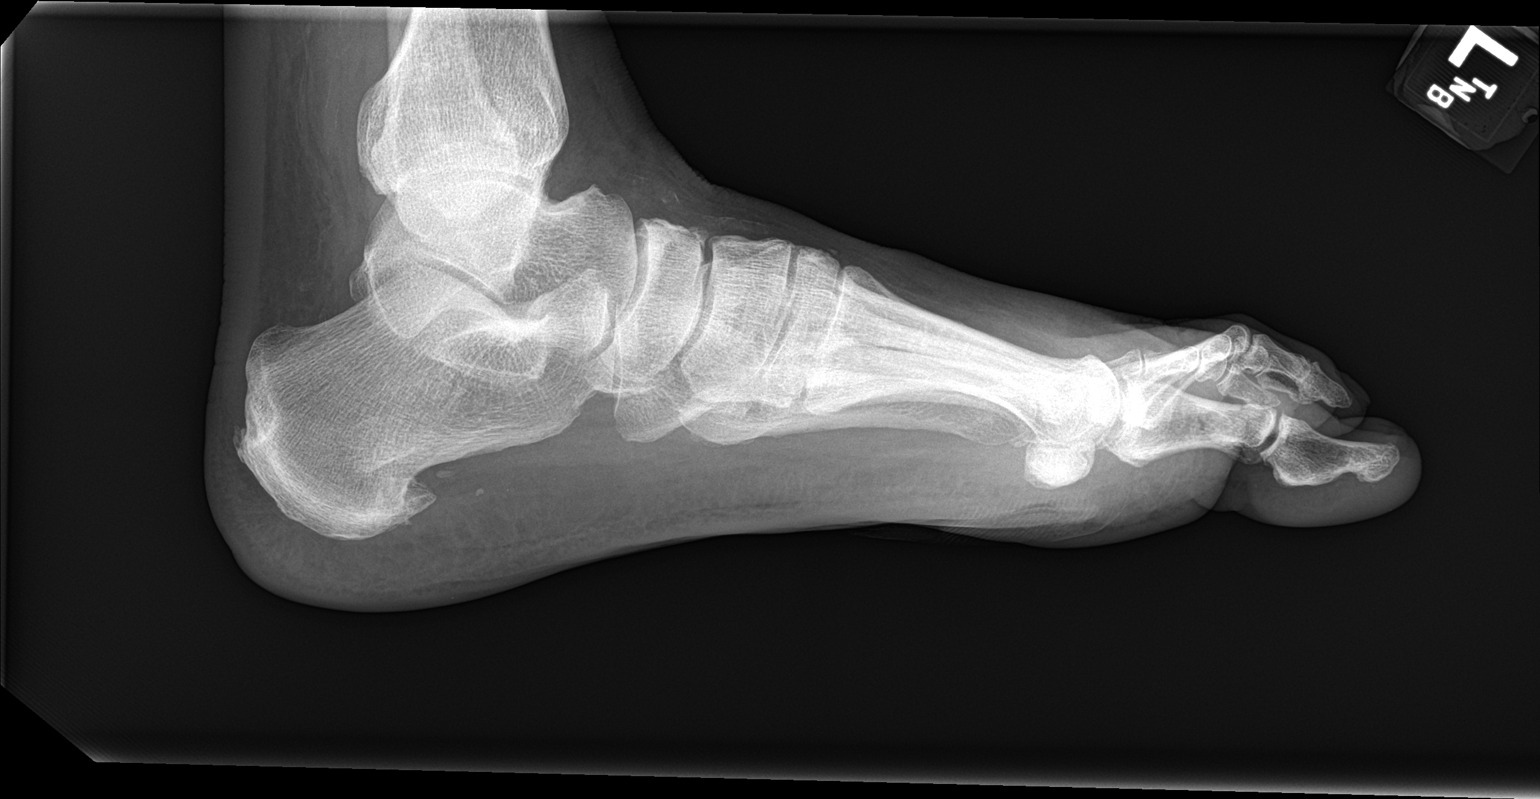

[3 of 3 positions shown; findings below may reference images not displayed]

FINDINGS: No tracking soft tissue gas. Stable to mildly increased soft tissue
swelling lateral to the 5th metatarsal head and phalanges. Joint
spaces and alignment are stable. No osteolysis identified. No acute
osseous abnormality identified. Calcified peripheral vascular
disease.
IMPRESSION: Stable bones of the left foot since [REDACTED]. No plain radiographic
evidence of osteomyelitis.

## 2021-01-05 MED ORDER — MUPIROCIN 2 % EX OINT: 1.0000 "application " | TOPICAL_OINTMENT | Freq: Two times a day (BID) | CUTANEOUS | 0 refills | Status: DC

## 2021-01-05 MED ORDER — DOXYCYCLINE HYCLATE 100 MG PO CAPS: 100.0000 mg | ORAL_CAPSULE | Freq: Two times a day (BID) | ORAL | 0 refills | Status: DC

## 2021-01-05 NOTE — Discharge Instructions (Addendum)
Please call [facility] Monday morning to get an appointment for Tuesday to see either Dr. Amalia Hailey or Dr. Posey Pronto.  I spoke with Dr. Sherryle Lis this morning and you can tell the office that that is his request.  Take the Doxycycline twice daily with food for 10 days.  Doxycycline will make you more sensitive to sunburn so wear sunscreen when outdoors and reapply it every 90 minutes.  Apply mupirocin ointment to your wound twice daily and cover with a nonadherent dressing.  Nonadherent dressings are sold under the brand "Telfa" in the store.  Use OTC Tylenol and Ibuprofen according to the package instructions as needed for pain.  Return for new or worsening symptoms.  These include increased redness, drainage, red streaks going up your foot, increased swelling, or fever.

## 2021-01-05 NOTE — ED Provider Notes (Signed)
[facility]    CSN: [id number] Arrival date & time: 01/05/21  0801      History   Chief Complaint Chief Complaint  Patient presents with   Foot Pain    left    HPI Christopher Escobar is a 65 y.o. male.   HPI  65 year old male here for evaluation of left foot wound.  Patient is here for evaluation of a new wound on his left foot.  He is currently being treated for a diabetic ulcer on the bottom of his left foot.  He currently has a nonadherent dressing intact covering the wound.  He states this morning when he woke up he noticed redness to his left fifth toe with some swelling and tenderness.  He states there was a blister on the outside of his foot that he popped with a lancet.  He states that there was no drainage at that time but he is now experiencing serosanguineous drainage from the wound.  He denies any fever or known injury.  He is here today because he is concerned that the infection has spread to the bone.  Past Medical History:  Diagnosis Date   Diabetes mellitus without complication ([place]) 2409   Hypertension 2008   Personal history of colonic polyps    TA polyp   Sleep apnea    not using cpap    Patient Active Problem List   Diagnosis Date Noted   Right foot infection 09/13/2019   HLD (hyperlipidemia) 09/13/2019   AKI (acute kidney injury) ([place]) 09/13/2019   Hyponatremia 09/13/2019   Class 2 obesity with body mass index (BMI) of 37.0 to 37.9 in adult 09/06/2019   Essential hypertension 06/03/2017   Degenerative joint disease of shoulder, left 12/03/2016   Incomplete tear of left rotator cuff 12/03/2016   Rotator cuff tendinitis, left 12/03/2016   Diabetes mellitus type 2, uncomplicated ([place]) 73/53/2992   History of arthroscopy of left shoulder 11/03/2016   History of colonic polyps 10/08/2016    Past Surgical History:  Procedure Laterality Date   AMPUTATION Right 09/15/2019   Procedure: AMPUTATION RIGHT FOOT 2ND DIGIT;  Surgeon: Felipa Furnace, DPM;  Location: [place];  Service: Podiatry;  Laterality: Right;   BACK SURGERY  1989   COLONOSCOPY  2013   COLONOSCOPY WITH PROPOFOL N/A 10/28/2016   Procedure: COLONOSCOPY WITH PROPOFOL;  Surgeon: Robert Bellow, MD;  Location: [facility];  Service: Endoscopy;  Laterality: N/A;   SHOULDER ARTHROSCOPY WITH SUBACROMIAL DECOMPRESSION Left 04/02/2017   Procedure: SHOULDER ARTHROSCOPY WITH DECOMPRESSION, DEBRIDEMENT, SLAP REPAIR,;  Surgeon: Corky Mull, MD;  Location: [facility];  Service: Orthopedics;  Laterality: Left;   SHOULDER SURGERY Bilateral 2011,2012       Home Medications    Prior to Admission medications   Medication Sig Start Date End Date Taking? Authorizing Provider  atorvastatin (LIPITOR) 40 MG tablet Take 40 mg by mouth daily at 6 PM.    Yes [provider]  doxycycline (VIBRAMYCIN) 100 MG capsule Take 1 capsule (100 mg total) by mouth 2 (two) times daily. 01/05/21  Yes Margarette Canada, NP  empagliflozin (JARDIANCE) 10 MG TABS tablet Take by mouth daily.   Yes [provider]  glimepiride (AMARYL) 4 MG tablet Take 2 tablets (8 mg total) by mouth 2 (two) times daily. Patient taking differently: Take 4 mg by mouth 2 (two) times daily. 04/02/17  Yes Poggi, Marshall Cork, MD  insulin detemir (LEVEMIR) 100 UNIT/ML injection Inject 50 Units into the  skin at bedtime.    Yes [provider]  lisinopril-hydrochlorothiazide (PRINZIDE,ZESTORETIC) 20-12.5 MG tablet Take 1 tablet every morning by mouth.    Yes [provider]  metoprolol succinate (TOPROL-XL) 25 MG 24 hr tablet Take 25 mg every morning by mouth.    Yes [provider]  multivitamin-lutein (OCUVITE-LUTEIN) CAPS capsule Take 1 capsule by mouth 2 (two) times daily.   Yes [provider]  mupirocin ointment (BACTROBAN) 2 % Apply 1 application topically 2 (two) times daily. 01/05/21  Yes Margarette Canada, NP  NOVOLOG MIX 70/30 FLEXPEN (70-30) 100 UNIT/ML FlexPen Inject 24 Units into the  skin 2 (two) times daily with a meal.  08/23/19  Yes [provider]  oxyCODONE-acetaminophen (PERCOCET) 7.5-325 MG tablet Take 1 tablet by mouth 5 (five) times daily. 08/28/19  Yes [provider]  pregabalin (LYRICA) 75 MG capsule Take 75 mg by mouth 2 (two) times daily.  05/18/17  Yes [provider]  collagenase (SANTYL) ointment Apply 1 application topically daily. 07/26/20   Felipa Furnace, DPM    Family History History reviewed. No pertinent family history.  Social History Social History   Tobacco Use   Smoking status: Former    Packs/day: 1.00    Years: 30.00    Pack years: 30.00    Types: Cigarettes    Quit date: 02/03/2007    Years since quitting: 13.9   Smokeless tobacco: Never  Vaping Use   Vaping Use: Never used  Substance Use Topics   Alcohol use: Yes    Comment: seldom   Drug use: No     Allergies   Patient has no known allergies.   Review of Systems Review of Systems  Constitutional:  Negative for fever.  Skin:  Positive for color change and wound.  Hematological: Negative.   Psychiatric/Behavioral: Negative.      Physical Exam Triage Vital Signs ED Triage Vitals  Enc Vitals Group     BP 01/05/21 0815 126/69     Pulse Rate 01/05/21 0815 82     Resp 01/05/21 0815 16     Temp 01/05/21 0815 98 F (36.7 C)     Temp Source 01/05/21 0815 Oral     SpO2 01/05/21 0815 100 %     Weight 01/05/21 0812 245 lb (111.1 kg)     Height 01/05/21 0812 5\' 11"  (1.803 m)     Head Circumference --      Peak Flow --      Pain Score 01/05/21 0811 4     Pain Loc --      Pain Edu? --      Excl. in [place]? --    No data found.  Updated Vital Signs BP 126/69 (BP Location: Left Arm)   Pulse 82   Temp 98 F (36.7 C) (Oral)   Resp 16   Ht 5\' 11"  (1.803 m)   Wt 245 lb (111.1 kg)   SpO2 100%   BMI 34.17 kg/m   Visual Acuity Right Eye Distance:   Left Eye Distance:   Bilateral Distance:    Right Eye Near:   Left Eye Near:    Bilateral  Near:     Physical Exam Vitals and nursing note reviewed.  Constitutional:      General: He is not in acute distress.    Appearance: Normal appearance. He is not ill-appearing.  HENT:     Head: Normocephalic and atraumatic.  Skin:    General: Skin is warm  and dry.     Capillary Refill: Capillary refill takes 2 to 3 seconds.     Findings: Erythema present.     Comments: The entire left fifth toe is erythematous and edematous.  There is a ruptured blister on the lateral aspect of the fifth MCP joint.  There is a mixture of serosanguineous and tan drainage from the wound.  Patient does have neuropathy but there is tenderness to the toe as well as the area where the blister was.  The area underneath the blister is dark in color.  There is no induration or fluctuance.  Neurological:     General: No focal deficit present.     Mental Status: He is alert and oriented to person, place, and time.  Psychiatric:        Mood and Affect: Mood normal.        Behavior: Behavior normal.        Thought Content: Thought content normal.        Judgment: Judgment normal.     UC Treatments / Results  Labs (all labs ordered are listed, but only abnormal results are displayed) Labs Reviewed - No data to display  EKG   Radiology DG Foot Complete Left  Result Date: 01/05/2021 CLINICAL DATA:  65 year old male with diabetic foot ulcers. Left 5th toe redness, drainage from the 5th MCP. EXAM: LEFT FOOT - COMPLETE 3+ VIEW COMPARISON:  10/12/2020. FINDINGS: No tracking soft tissue gas. Stable to mildly increased soft tissue swelling lateral to the 5th metatarsal head and phalanges. Joint spaces and alignment are stable. No osteolysis identified. No acute osseous abnormality identified. Calcified peripheral vascular disease. IMPRESSION: Stable bones of the left foot since July. No plain radiographic evidence of osteomyelitis. Electronically Signed   By: Genevie Ann M.D.   On: 01/05/2021 08:56     Procedures Procedures (including critical care time)  Medications Ordered in UC Medications - No data to display  Initial Impression / Assessment and Plan / UC Course  I have reviewed the triage vital signs and the nursing notes.  Pertinent labs & imaging results that were available during my care of the patient were reviewed by me and considered in my medical decision making (see chart for details).  Patient is a very pleasant 65 year old male here for evaluation of a new wound that he noticed this morning on the outside of his left foot with corresponding erythema and edema to the left fifth digit.  Patient states that it looked like a blister on the outside of his foot and he punctured it with a lancet.  He states he did not get any drainage when he punctured the the blister but it is now draining a serosanguineous drainage.  There is also some tan pus that can be expressed from the wound.  The tissue underlying the blister cover is dark in color but there is no bogginess, induration, or fluctuance noted.  The fifth digit itself is erythematous and edematous with some generalized tenderness.  No specific tenderness over any of the IP joints.  Cap refill in that toe is approximately 3 seconds.  DP and PT pulses in the left foot are 2+.  We will obtain radiograph of left foot to look for possible osteomyelitis.  Plan of care is to send to the hospital if there are inflammatory changes to the bone.  If not I will consult Dr. Sherryle Lis who is on-call for [facility] foot [facility] for treatment recommendations.    Left foot  x-rays independently reviewed and evaluated by me.  Impression: On the AP and lateral view there is evidence of soft tissue swelling.  The heads of the fourth and fifth metatarsal have questionable inflammatory changes.  There appears to be some degenerative changes to the head of the fourth metatarsal.  There are lucencies under the medial aspect of the head of the fifth metatarsal  that are possibly sesamoid bones.  There is no apparent gas in the tissues.  Radiology overread is pending. Radiology impression is stable bones of the left foot since July.  No plain radiographic evidence of osteomyelitis.  I spoke with Dr. Sherryle Lis from [facility] via epic secure chat.  He is in agreement with the plan of doxycycline twice daily and a dry nonadherent dressing.  He would advise adding mupirocin topically to the treatment regimen.  Patient is to call the office on Monday to be seen on Tuesday by either Dr. Amalia Hailey or Posey Pronto.   Final Clinical Impressions(s) / UC Diagnoses   Final diagnoses:  Diabetic ulcer of toe of left foot associated with type 2 diabetes mellitus, limited to breakdown of skin [facility])     Discharge Instructions      Please call [facility] Monday morning to get an appointment for Tuesday to see either Dr. Amalia Hailey or Dr. Posey Pronto.  I spoke with Dr. Sherryle Lis this morning and you can tell the office that that is his request.  Take the Doxycycline twice daily with food for 10 days.  Doxycycline will make you more sensitive to sunburn so wear sunscreen when outdoors and reapply it every 90 minutes.  Apply mupirocin ointment to your wound twice daily and cover with a nonadherent dressing.  Nonadherent dressings are sold under the brand "Telfa" in the store.  Use OTC Tylenol and Ibuprofen according to the package instructions as needed for pain.  Return for new or worsening symptoms.  These include increased redness, drainage, red streaks going up your foot, increased swelling, or fever.     ED Prescriptions     Medication Sig Dispense Auth. Provider   doxycycline (VIBRAMYCIN) 100 MG capsule Take 1 capsule (100 mg total) by mouth 2 (two) times daily. 20 capsule Margarette Canada, NP   mupirocin ointment (BACTROBAN) 2 % Apply 1 application topically 2 (two) times daily. 22 g Margarette Canada, NP      PDMP not reviewed this encounter.   Margarette Canada, NP 01/05/21 [phone]

## 2021-01-05 NOTE — ED Triage Notes (Signed)
Patient states that he noticed a sore on the outside of his left foot this morning.  Patient states that he is already being treated for diabetic ulcers on both feet.  Patient denies fevers or chill.

## 2021-01-10 ENCOUNTER — Other Ambulatory Visit: Payer: Self-pay

## 2021-01-10 ENCOUNTER — Ambulatory Visit: Payer: Medicare HMO | Admitting: Podiatry

## 2021-01-10 DIAGNOSIS — L97422 Non-pressure chronic ulcer of left heel and midfoot with fat layer exposed: Secondary | ICD-10-CM

## 2021-01-10 DIAGNOSIS — E11621 Type 2 diabetes mellitus with foot ulcer: Secondary | ICD-10-CM | POA: Diagnosis not present

## 2021-01-14 NOTE — Progress Notes (Signed)
Subjective:  Patient ID: Christopher Escobar, male    DOB: 22-Sep-1955,  MRN: [id number]  Chief Complaint  Patient presents with   Wound Check    Wound care     65 y.o. male presents for wound care.  Patient presents for follow-up of bilateral midfoot ulceration.  Patient states the right side is completely healed now he only has a left side.  He would like to continue local wound care for that.  The left side has gone down a lot better.  He overall he is doing much better.   Review of Systems: Negative except as noted in the HPI. Denies N/V/F/Ch.  Past Medical History:  Diagnosis Date   Diabetes mellitus without complication ([place]) 1941   Hypertension 2008   Personal history of colonic polyps    TA polyp   Sleep apnea    not using cpap    Current Outpatient Medications:    atorvastatin (LIPITOR) 40 MG tablet, Take 40 mg by mouth daily at 6 PM. , Disp: , Rfl:    collagenase (SANTYL) ointment, Apply 1 application topically daily., Disp: 30 g, Rfl: 1   doxycycline (VIBRAMYCIN) 100 MG capsule, Take 1 capsule (100 mg total) by mouth 2 (two) times daily., Disp: 20 capsule, Rfl: 0   empagliflozin (JARDIANCE) 10 MG TABS tablet, Take by mouth daily., Disp: , Rfl:    glimepiride (AMARYL) 4 MG tablet, Take 2 tablets (8 mg total) by mouth 2 (two) times daily. (Patient taking differently: Take 4 mg by mouth 2 (two) times daily.), Disp: 60 tablet, Rfl: 3   insulin detemir (LEVEMIR) 100 UNIT/ML injection, Inject 50 Units into the skin at bedtime. , Disp: , Rfl:    lisinopril-hydrochlorothiazide (PRINZIDE,ZESTORETIC) 20-12.5 MG tablet, Take 1 tablet every morning by mouth. , Disp: , Rfl:    metoprolol succinate (TOPROL-XL) 25 MG 24 hr tablet, Take 25 mg every morning by mouth. , Disp: , Rfl:    multivitamin-lutein (OCUVITE-LUTEIN) CAPS capsule, Take 1 capsule by mouth 2 (two) times daily., Disp: , Rfl:    mupirocin ointment (BACTROBAN) 2 %, Apply 1 application topically 2 (two) times daily., Disp: 22  g, Rfl: 0   NOVOLOG MIX 70/30 FLEXPEN (70-30) 100 UNIT/ML FlexPen, Inject 24 Units into the skin 2 (two) times daily with a meal. , Disp: , Rfl:    oxyCODONE-acetaminophen (PERCOCET) 7.5-325 MG tablet, Take 1 tablet by mouth 5 (five) times daily., Disp: , Rfl:    pregabalin (LYRICA) 75 MG capsule, Take 75 mg by mouth 2 (two) times daily. , Disp: , Rfl:   Social History   Tobacco Use  Smoking Status Former   Packs/day: 1.00   Years: 30.00   Pack years: 30.00   Types: Cigarettes   Quit date: 02/03/2007   Years since quitting: 13.9  Smokeless Tobacco Never    No Known Allergies Objective:  There were no vitals filed for this visit. There is no height or weight on file to calculate BMI. Constitutional Well developed. Well nourished.  Vascular Dorsalis pedis pulses palpable bilaterally. Posterior tibial pulses palpable bilaterally. Capillary refill normal to all digits.  No cyanosis or clubbing noted. Pedal hair growth normal.  Neurologic Normal speech. Oriented to person, place, and time. Protective sensation absent  Dermatologic Wound Location: Left plantar midfoot with fat layer exposed.  No clinical signs of infection noted.  No purulent drainage noted. Wound Base: Mixed Granular/Fibrotic Peri-wound: Normal Exudate: Scant/small amount Serosanguinous exudate Wound Measurements: -See below  Right clinically healed  Orthopedic: No pain to palpation either foot.   Radiographs: None Assessment:   1. Diabetic ulcer of left midfoot associated with type 2 diabetes mellitus, with fat layer exposed ([place])      Plan:  Patient was evaluated and treated and all questions answered.  Right midfoot ulceration -Clinically healed  Ulcer left midfoot ulceration with fat layer exposed  -Debridement as below. -Dressed with Betadine wet-to-dry, DSD. -Continue off-loading with surgical shoe.    Procedure: Excisional Debridement of Wound left side Tool: Sharp chisel blade/tissue  nipper Rationale: Removal of non-viable soft tissue from the wound to promote healing.  Anesthesia: none Pre-Debridement Wound Measurements: 0.6 cm x 0.6 cm x 0.4 cm  Post-Debridement Wound Measurements: 0.8 cm x 0.8cm x 0.4 cm  Type of Debridement: Sharp Excisional Tissue Removed: Non-viable soft tissue Blood loss: Minimal (<50cc) Depth of Debridement: subcutaneous tissue. Technique: Sharp excisional debridement to bleeding, viable wound base.  Wound Progress: The wound has decreased drastically Dressing: Dry, sterile, compression dressing. Disposition: Patient tolerated procedure well. Patient to return in 1 week for follow-up. No follow-ups on file.

## 2021-01-17 ENCOUNTER — Ambulatory Visit: Payer: Medicare HMO | Admitting: Podiatry

## 2021-02-07 ENCOUNTER — Other Ambulatory Visit: Payer: Self-pay

## 2021-02-07 ENCOUNTER — Emergency Department: Payer: Medicare HMO

## 2021-02-07 ENCOUNTER — Inpatient Hospital Stay
Admission: EM | Admit: 2021-02-07 | Discharge: 2021-02-09 | DRG: 854 | Disposition: A | Discharge status: Home / Self Care | Payer: Medicare HMO | Attending: Internal Medicine | Admitting: Internal Medicine

## 2021-02-07 ENCOUNTER — Ambulatory Visit: Payer: Medicare HMO | Admitting: Podiatry

## 2021-02-07 ENCOUNTER — Inpatient Hospital Stay: Payer: Medicare HMO

## 2021-02-07 DIAGNOSIS — I959 Hypotension, unspecified: Secondary | ICD-10-CM | POA: Diagnosis not present

## 2021-02-07 DIAGNOSIS — E872 Acidosis, unspecified: Secondary | ICD-10-CM | POA: Diagnosis present

## 2021-02-07 DIAGNOSIS — M86272 Subacute osteomyelitis, left ankle and foot: Secondary | ICD-10-CM | POA: Diagnosis present

## 2021-02-07 DIAGNOSIS — E1151 Type 2 diabetes mellitus with diabetic peripheral angiopathy without gangrene: Secondary | ICD-10-CM | POA: Diagnosis present

## 2021-02-07 DIAGNOSIS — E119 Type 2 diabetes mellitus without complications: Secondary | ICD-10-CM | POA: Diagnosis not present

## 2021-02-07 DIAGNOSIS — I10 Essential (primary) hypertension: Secondary | ICD-10-CM | POA: Diagnosis present

## 2021-02-07 DIAGNOSIS — E1169 Type 2 diabetes mellitus with other specified complication: Secondary | ICD-10-CM | POA: Diagnosis present

## 2021-02-07 DIAGNOSIS — E1142 Type 2 diabetes mellitus with diabetic polyneuropathy: Secondary | ICD-10-CM

## 2021-02-07 DIAGNOSIS — Z794 Long term (current) use of insulin: Secondary | ICD-10-CM | POA: Diagnosis not present

## 2021-02-07 DIAGNOSIS — L03032 Cellulitis of left toe: Secondary | ICD-10-CM

## 2021-02-07 DIAGNOSIS — G4730 Sleep apnea, unspecified: Secondary | ICD-10-CM | POA: Diagnosis present

## 2021-02-07 DIAGNOSIS — Z79899 Other long term (current) drug therapy: Secondary | ICD-10-CM | POA: Diagnosis not present

## 2021-02-07 DIAGNOSIS — Z8719 Personal history of other diseases of the digestive system: Secondary | ICD-10-CM

## 2021-02-07 DIAGNOSIS — E871 Hypo-osmolality and hyponatremia: Secondary | ICD-10-CM | POA: Diagnosis present

## 2021-02-07 DIAGNOSIS — M869 Osteomyelitis, unspecified: Secondary | ICD-10-CM | POA: Diagnosis not present

## 2021-02-07 DIAGNOSIS — I739 Peripheral vascular disease, unspecified: Secondary | ICD-10-CM | POA: Diagnosis present

## 2021-02-07 DIAGNOSIS — L97422 Non-pressure chronic ulcer of left heel and midfoot with fat layer exposed: Secondary | ICD-10-CM

## 2021-02-07 DIAGNOSIS — E1165 Type 2 diabetes mellitus with hyperglycemia: Secondary | ICD-10-CM | POA: Diagnosis present

## 2021-02-07 DIAGNOSIS — Z20822 Contact with and (suspected) exposure to covid-19: Secondary | ICD-10-CM | POA: Diagnosis present

## 2021-02-07 DIAGNOSIS — Z89421 Acquired absence of other right toe(s): Secondary | ICD-10-CM | POA: Diagnosis not present

## 2021-02-07 DIAGNOSIS — Z7984 Long term (current) use of oral hypoglycemic drugs: Secondary | ICD-10-CM | POA: Diagnosis not present

## 2021-02-07 DIAGNOSIS — M86172 Other acute osteomyelitis, left ankle and foot: Secondary | ICD-10-CM | POA: Diagnosis not present

## 2021-02-07 DIAGNOSIS — N400 Enlarged prostate without lower urinary tract symptoms: Secondary | ICD-10-CM | POA: Diagnosis present

## 2021-02-07 DIAGNOSIS — E11621 Type 2 diabetes mellitus with foot ulcer: Secondary | ICD-10-CM | POA: Diagnosis present

## 2021-02-07 DIAGNOSIS — Z87891 Personal history of nicotine dependence: Secondary | ICD-10-CM | POA: Diagnosis not present

## 2021-02-07 DIAGNOSIS — L97526 Non-pressure chronic ulcer of other part of left foot with bone involvement without evidence of necrosis: Secondary | ICD-10-CM | POA: Diagnosis present

## 2021-02-07 DIAGNOSIS — A401 Sepsis due to streptococcus, group B: Secondary | ICD-10-CM | POA: Diagnosis not present

## 2021-02-07 DIAGNOSIS — A419 Sepsis, unspecified organism: Secondary | ICD-10-CM | POA: Diagnosis present

## 2021-02-07 DIAGNOSIS — E785 Hyperlipidemia, unspecified: Secondary | ICD-10-CM | POA: Diagnosis present

## 2021-02-07 LAB — CBC WITH DIFFERENTIAL/PLATELET
Abs Immature Granulocytes: 0.07 10*3/uL (ref 0.00–0.07)
Basophils Absolute: 0.1 10*3/uL (ref 0.0–0.1)
Basophils Relative: 1 %
Eosinophils Absolute: 0.1 10*3/uL (ref 0.0–0.5)
Eosinophils Relative: 1 %
HCT: 46.9 % (ref 39.0–52.0)
Hemoglobin: 15.6 g/dL (ref 13.0–17.0)
Immature Granulocytes: 1 %
Lymphocytes Relative: 16 %
Lymphs Abs: 2.1 10*3/uL (ref 0.7–4.0)
MCH: 30.3 pg (ref 26.0–34.0)
MCHC: 33.3 g/dL (ref 30.0–36.0)
MCV: 91.1 fL (ref 80.0–100.0)
Monocytes Absolute: 1.4 10*3/uL — ABNORMAL HIGH (ref 0.1–1.0)
Monocytes Relative: 10 %
Neutro Abs: 9.9 10*3/uL — ABNORMAL HIGH (ref 1.7–7.7)
Neutrophils Relative %: 71 %
Platelets: 244 10*3/uL (ref 150–400)
RBC: 5.15 MIL/uL (ref 4.22–5.81)
RDW: 13.4 % (ref 11.5–15.5)
WBC: 13.7 10*3/uL — ABNORMAL HIGH (ref 4.0–10.5)
nRBC: 0 % (ref 0.0–0.2)

## 2021-02-07 LAB — GLUCOSE, CAPILLARY
Glucose-Capillary: 262 mg/dL — ABNORMAL HIGH (ref 70–99)
Glucose-Capillary: 298 mg/dL — ABNORMAL HIGH (ref 70–99)

## 2021-02-07 LAB — COMPREHENSIVE METABOLIC PANEL
ALT: 13 U/L (ref 0–44)
AST: 14 U/L — ABNORMAL LOW (ref 15–41)
Albumin: 3.4 g/dL — ABNORMAL LOW (ref 3.5–5.0)
Alkaline Phosphatase: 63 U/L (ref 38–126)
Anion gap: 9 (ref 5–15)
BUN: 21 mg/dL (ref 8–23)
CO2: 24 mmol/L (ref 22–32)
Calcium: 9.5 mg/dL (ref 8.9–10.3)
Chloride: 99 mmol/L (ref 98–111)
Creatinine, Ser: 1.16 mg/dL (ref 0.61–1.24)
GFR, Estimated: >60 mL/min (ref >60)
Glucose, Bld: 309 mg/dL — ABNORMAL HIGH (ref 70–99)
Potassium: 4.3 mmol/L (ref 3.5–5.1)
Sodium: 132 mmol/L — ABNORMAL LOW (ref 135–145)
Total Bilirubin: 1.6 mg/dL — ABNORMAL HIGH (ref 0.3–1.2)
Total Protein: 7.6 g/dL (ref 6.5–8.1)

## 2021-02-07 LAB — LACTIC ACID, PLASMA
Lactic Acid, Venous: 1.9 mmol/L (ref 0.5–1.9)
Lactic Acid, Venous: 1.8 mmol/L (ref 0.5–1.9)
Lactic Acid, Venous: 3.1 mmol/L (ref 0.5–1.9)
Lactic Acid, Venous: 2.4 mmol/L (ref 0.5–1.9)

## 2021-02-07 LAB — APTT: aPTT: 27 seconds (ref 24–36)

## 2021-02-07 LAB — PROTIME-INR
INR: 1.1 (ref 0.8–1.2)
Prothrombin Time: 13.9 seconds (ref 11.4–15.2)

## 2021-02-07 LAB — RESP PANEL BY RT-PCR (FLU A&B, COVID) ARPGX2
Influenza A by PCR: NEGATIVE
Influenza B by PCR: NEGATIVE
SARS Coronavirus 2 by RT PCR: NEGATIVE

## 2021-02-07 LAB — HEMOGLOBIN A1C
Hgb A1c MFr Bld: 9.1 % — ABNORMAL HIGH (ref 4.8–5.6)
Mean Plasma Glucose: 214.47 mg/dL

## 2021-02-07 IMAGING — MR MR FOOT*L* W/O CM
6 series · 40 of 40 positions shown · non-contrast
Comparison: Radiographs [DATE]

CLINICAL DATA: Wound infection of the left foot worsening over the
last few days.

EXAM:
MRI OF THE LEFT FOOT WITHOUT CONTRAST
TECHNIQUE: Multiplanar, multisequence MR imaging of the left forefoot was
performed. No intravenous contrast was administered.

[Series 4: T1 · coronal · left · 3.0mm · 0.38mm/px · 10 of 53 slices shown (1 of 2)]
[im 1/53]
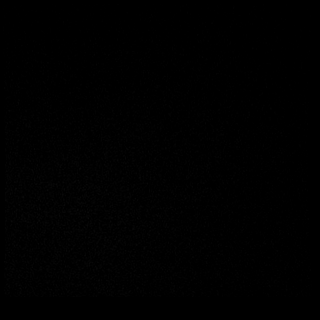
[im 6/53]
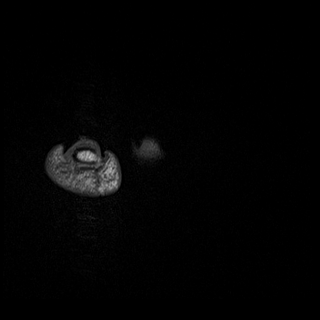
[im 12/53]
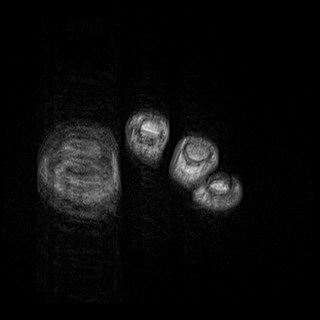
[im 18/53]
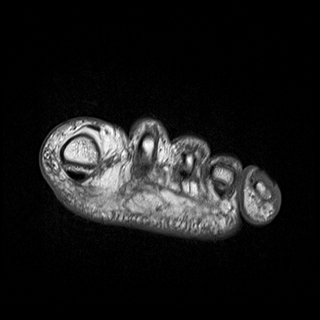
[im 24/53]
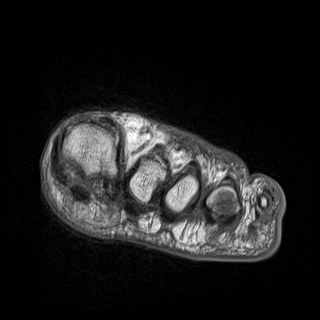
[im 29/53]
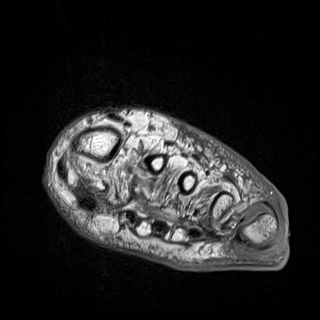
[im 35/53]
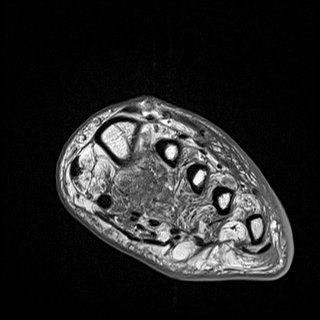
[im 41/53]
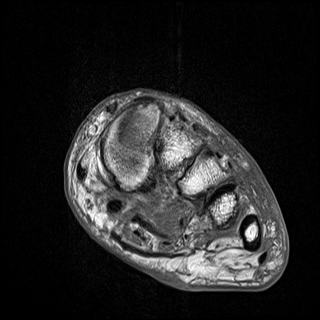
[im 47/53]
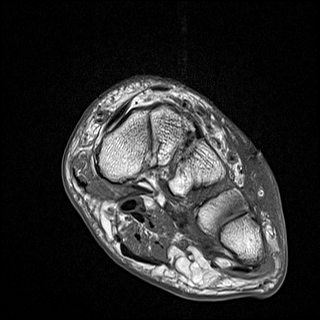
[im 53/53]
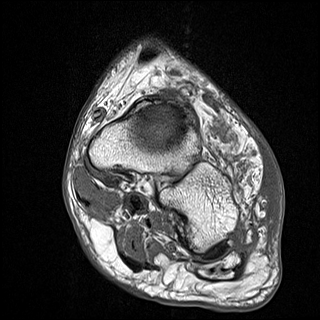

[Series 5: T2 · coronal · left · 3.0mm · 0.38mm/px · 10 of 52 slices shown (1 of 2)]
[im 1/52]
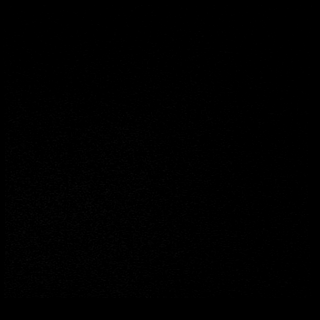
[im 6/52]
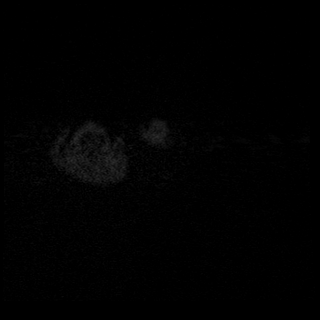
[im 12/52]
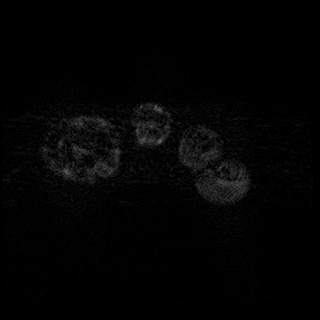
[im 18/52]
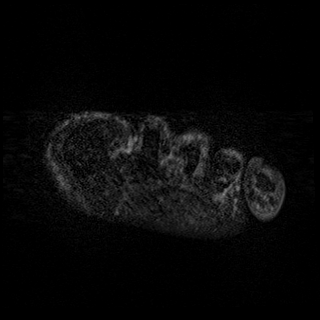
[im 23/52]
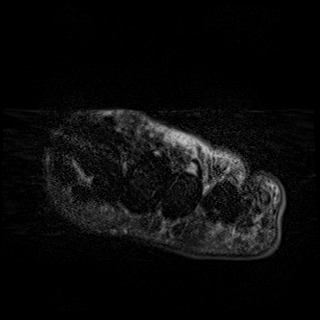
[im 29/52]
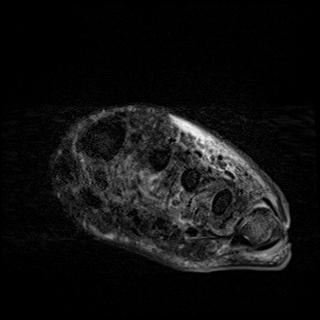
[im 35/52]
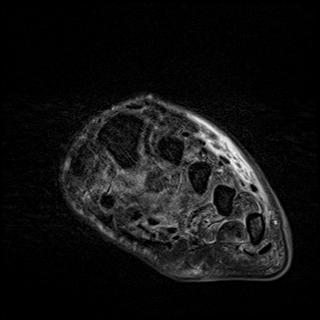
[im 40/52]
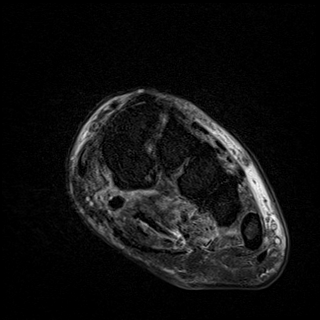
[im 46/52]
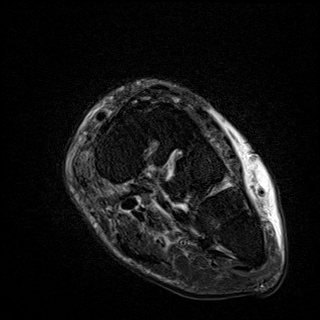
[im 52/52]
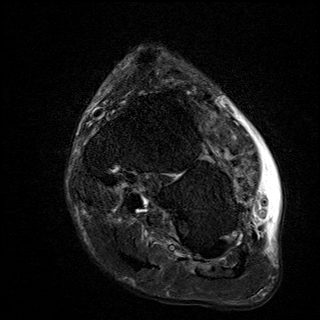

[Series 7: T1 · axial · left · 3.0mm · 0.70mm/px · z∈[-83,-1]mm · 4 of 23 slices shown (2 of 2)]
[im 1/23]
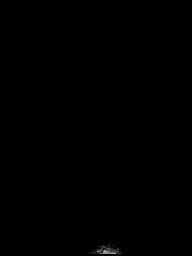
[im 8/23]
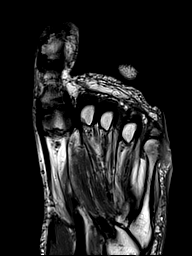
[im 15/23]
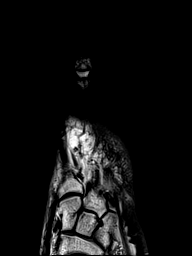
[im 23/23]
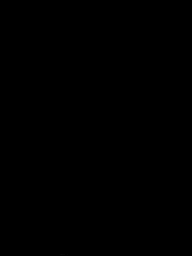

[Series 9: T2 · axial · left · 3.0mm · 0.70mm/px · z∈[-87,-1]mm · 4 of 24 slices shown (2 of 2)]
[im 1/24]
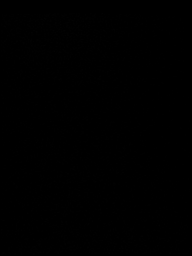
[im 8/24]
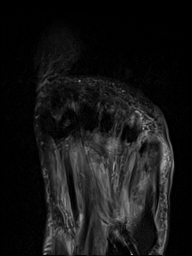
[im 16/24]
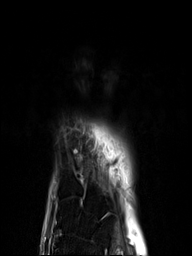
[im 24/24]
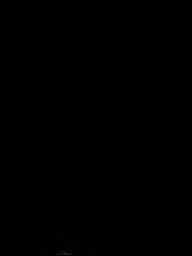

[Series 10: STIR · sagittal · left · 3.0mm · 0.62mm/px · 6 of 32 slices shown (1 of 2)]
[im 1/32]
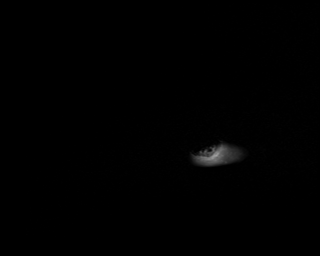
[im 7/32]
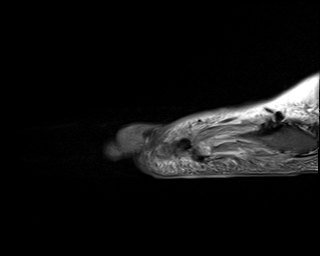
[im 13/32]
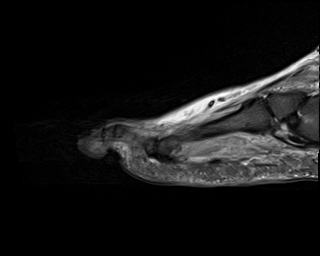
[im 19/32]
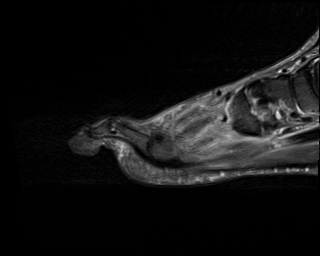
[im 25/32]
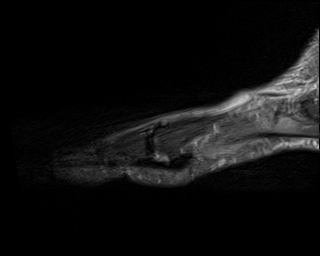
[im 32/32]
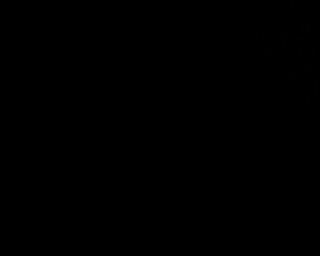

[Series 11: STIR · sagittal · left · 3.0mm · 0.62mm/px · 6 of 34 slices shown (2 of 2)]
[im 1/34]
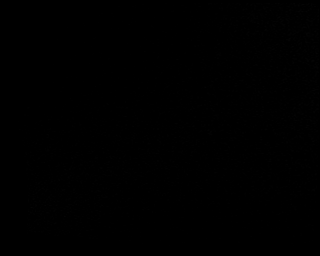
[im 7/34]
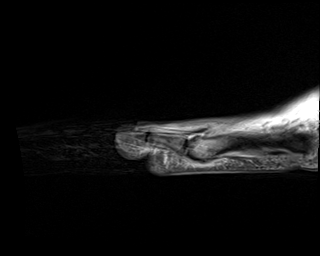
[im 14/34]
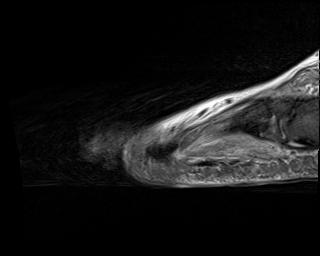
[im 20/34]
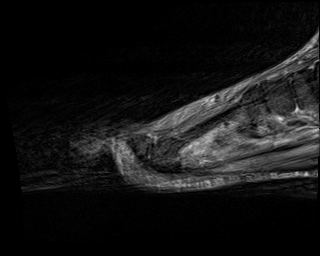
[im 27/34]
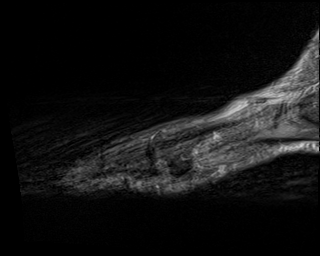
[im 34/34]
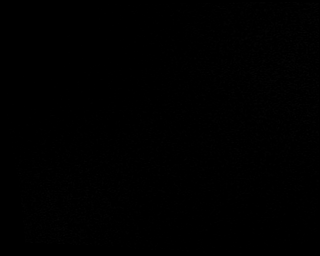

[40 of 40 positions shown; findings below may reference images not displayed]

FINDINGS: Despite efforts by the technologist and patient, motion artifact is
present on today's exam and could not be eliminated. This reduces
exam sensitivity and specificity.

Bones/Joint/Cartilage

Abnormal marrow edema in the head and distal metaphysis of the fifth
metatarsal likely with reduced cortical definition, highly
suspicious for active osteomyelitis.

Subtle accentuated edema signal in the base of the proximal phalanx
on image 14 series 9 likewise reflect osteomyelitis.

Mild degenerative spurring along the Lisfranc joint. Likely
degenerative 5 mm non-fragmented osteochondral lesion of the distal
first metatarsal head on image 14 series 7. Marginal spurring at the
first MTP joint. Trabecular accentuation in the first digit lateral
sesamoid, but without marrow edema.

Ligaments

The Lisfranc ligament appears intact.

Muscles and Tendons

Muscular atrophy with subtle diffuse muscular edema signal which is
probably neurogenic.

Soft tissues

Substantial wound/ulceration lateral to the fifth MTP joint with
surrounding subcutaneous edema favoring cellulitis. There is also
subcutaneous edema tracking in the dorsum of the foot and into the
fifth toe and to a lesser extent into the remaining toes which could
also potentially be from cellulitis.
IMPRESSION: 1. Active osteomyelitis of the head and distal metaphysis of the
fifth metatarsal and likely of the base of the proximal phalanx of
the small toe. Prominent ulceration just lateral to the fifth MTP
joint with surrounding cellulitis.
2. Degenerative findings at the first MTP joint and mild
degenerative findings along the Lisfranc joint.
3. Atrophy and low-grade edema in the regional musculature likely
neurogenic.
4. Dorsal subcutaneous edema along the foot, cellulitis is not
excluded. Edema tracks into the fifth toe and to a lesser extent
into the remaining toes.

## 2021-02-07 MED ORDER — INSULIN ASPART PROT & ASPART (70-30 MIX) 100 UNIT/ML PEN
24.0000 [IU] | PEN_INJECTOR | Freq: Two times a day (BID) | SUBCUTANEOUS | Status: DC
  Filled 2021-02-07: qty 3

## 2021-02-07 MED ORDER — LISINOPRIL 20 MG PO TABS: 20.0000 mg | ORAL_TABLET | Freq: Every day | ORAL | Status: DC

## 2021-02-07 MED ORDER — VANCOMYCIN HCL IN DEXTROSE 1-5 GM/200ML-% IV SOLN: 1000.0000 mg | Freq: Once | INTRAVENOUS | Status: DC

## 2021-02-07 MED ORDER — SODIUM CHLORIDE 0.9 % IV SOLN
2.0000 g | INTRAVENOUS | Status: DC
  Administered 2021-02-07: 2 g via INTRAVENOUS
  Filled 2021-02-07: qty 20

## 2021-02-07 MED ORDER — ONDANSETRON HCL 4 MG PO TABS: 4.0000 mg | ORAL_TABLET | Freq: Four times a day (QID) | ORAL | Status: DC | PRN

## 2021-02-07 MED ORDER — VANCOMYCIN HCL IN DEXTROSE 1-5 GM/200ML-% IV SOLN
1000.0000 mg | Freq: Once | INTRAVENOUS | Status: DC
Start: 2021-02-07 — End: 2021-02-07

## 2021-02-07 MED ORDER — METOPROLOL SUCCINATE ER 50 MG PO TB24
25.0000 mg | ORAL_TABLET | ORAL | Status: DC
  Administered 2021-02-08: 25 mg via ORAL
  Filled 2021-02-07: qty 1

## 2021-02-07 MED ORDER — INSULIN ASPART 100 UNIT/ML IJ SOLN
0.0000 [IU] | Freq: Three times a day (TID) | INTRAMUSCULAR | Status: DC
  Administered 2021-02-07 (×2): 8 [IU] via SUBCUTANEOUS
  Administered 2021-02-08 (×2): 3 [IU] via SUBCUTANEOUS
  Administered 2021-02-08: 5 [IU] via SUBCUTANEOUS
  Administered 2021-02-09: 2 [IU] via SUBCUTANEOUS
  Administered 2021-02-09: 5 [IU] via SUBCUTANEOUS
  Filled 2021-02-07 (×7): qty 1

## 2021-02-07 MED ORDER — VANCOMYCIN HCL 10 G IV SOLR
2500.0000 mg | Freq: Once | INTRAVENOUS | Status: AC
  Administered 2021-02-07: 2500 mg via INTRAVENOUS
  Filled 2021-02-07: qty 50

## 2021-02-07 MED ORDER — VANCOMYCIN HCL 1750 MG/350ML IV SOLN
1750.0000 mg | INTRAVENOUS | Status: DC
  Administered 2021-02-08: 1750 mg via INTRAVENOUS
  Filled 2021-02-07 (×2): qty 350

## 2021-02-07 MED ORDER — ENOXAPARIN SODIUM 60 MG/0.6ML IJ SOSY
0.5000 mg/kg | PREFILLED_SYRINGE | INTRAMUSCULAR | Status: DC
  Administered 2021-02-07 – 2021-02-08 (×2): 55 mg via SUBCUTANEOUS
  Filled 2021-02-07 (×2): qty 0.6

## 2021-02-07 MED ORDER — ATORVASTATIN CALCIUM 20 MG PO TABS
40.0000 mg | ORAL_TABLET | Freq: Every day | ORAL | Status: DC
  Administered 2021-02-07: 40 mg via ORAL
  Filled 2021-02-07: qty 2

## 2021-02-07 MED ORDER — SODIUM CHLORIDE 0.9 % IV SOLN: INTRAVENOUS | Status: DC

## 2021-02-07 MED ORDER — ACETAMINOPHEN 325 MG PO TABS: 650.0000 mg | ORAL_TABLET | Freq: Four times a day (QID) | ORAL | Status: DC | PRN

## 2021-02-07 MED ORDER — HYDROCHLOROTHIAZIDE 12.5 MG PO TABS: 12.5000 mg | ORAL_TABLET | Freq: Every day | ORAL | Status: DC

## 2021-02-07 MED ORDER — METRONIDAZOLE 500 MG/100ML IV SOLN: 500.0000 mg | Freq: Two times a day (BID) | INTRAVENOUS | Status: DC

## 2021-02-07 MED ORDER — ONDANSETRON HCL 4 MG/2ML IJ SOLN
4.0000 mg | Freq: Four times a day (QID) | INTRAMUSCULAR | Status: DC | PRN
  Administered 2021-02-07: 4 mg via INTRAVENOUS

## 2021-02-07 MED ORDER — MORPHINE SULFATE (PF) 4 MG/ML IV SOLN
4.0000 mg | Freq: Once | INTRAVENOUS | Status: AC
  Administered 2021-02-07: 4 mg via INTRAVENOUS
  Filled 2021-02-07: qty 1

## 2021-02-07 MED ORDER — PIPERACILLIN-TAZOBACTAM 3.375 G IVPB
3.3750 g | Freq: Three times a day (TID) | INTRAVENOUS | Status: DC
  Administered 2021-02-07 – 2021-02-09 (×5): 3.375 g via INTRAVENOUS
  Filled 2021-02-07 (×5): qty 50

## 2021-02-07 MED ORDER — TRAZODONE HCL 50 MG PO TABS
25.0000 mg | ORAL_TABLET | Freq: Every evening | ORAL | Status: DC | PRN
  Administered 2021-02-07 – 2021-02-08 (×2): 25 mg via ORAL
  Filled 2021-02-07 (×2): qty 1

## 2021-02-07 MED ORDER — ACETAMINOPHEN 650 MG RE SUPP
650.0000 mg | Freq: Four times a day (QID) | RECTAL | Status: DC | PRN
  Filled 2021-02-07: qty 1

## 2021-02-07 MED ORDER — MUPIROCIN 2 % EX OINT
1.0000 | TOPICAL_OINTMENT | Freq: Two times a day (BID) | CUTANEOUS | Status: DC
Start: 2021-02-07 — End: 2021-02-09
  Administered 2021-02-07 – 2021-02-09 (×4): 1 via TOPICAL
  Filled 2021-02-07 (×2): qty 22

## 2021-02-07 MED ORDER — GLIMEPIRIDE 4 MG PO TABS
4.0000 mg | ORAL_TABLET | Freq: Two times a day (BID) | ORAL | Status: DC
  Filled 2021-02-07: qty 1

## 2021-02-07 MED ORDER — VANCOMYCIN HCL 1500 MG/300ML IV SOLN
1500.0000 mg | Freq: Once | INTRAVENOUS | Status: DC
  Filled 2021-02-07: qty 300

## 2021-02-07 MED ORDER — PIPERACILLIN-TAZOBACTAM 3.375 G IVPB 30 MIN: 3.3750 g | Freq: Four times a day (QID) | INTRAVENOUS | Status: DC

## 2021-02-07 MED ORDER — MAGNESIUM HYDROXIDE 400 MG/5ML PO SUSP: 30.0000 mL | Freq: Every day | ORAL | Status: DC | PRN

## 2021-02-07 MED ORDER — MORPHINE SULFATE (PF) 2 MG/ML IV SOLN
2.0000 mg | INTRAVENOUS | Status: DC | PRN
  Administered 2021-02-07 – 2021-02-09 (×7): 2 mg via INTRAVENOUS
  Filled 2021-02-07 (×7): qty 1

## 2021-02-07 MED ORDER — INSULIN DETEMIR 100 UNIT/ML ~~LOC~~ SOLN
50.0000 [IU] | Freq: Every day | SUBCUTANEOUS | Status: DC
  Administered 2021-02-07 – 2021-02-08 (×2): 50 [IU] via SUBCUTANEOUS
  Filled 2021-02-07 (×3): qty 0.5

## 2021-02-07 MED ORDER — LISINOPRIL-HYDROCHLOROTHIAZIDE 20-12.5 MG PO TABS: 1.0000 | ORAL_TABLET | ORAL | Status: DC

## 2021-02-07 MED ORDER — COLLAGENASE 250 UNIT/GM EX OINT
1.0000 "application " | TOPICAL_OINTMENT | Freq: Every day | CUTANEOUS | Status: DC
  Administered 2021-02-07 – 2021-02-09 (×3): 1 via TOPICAL
  Filled 2021-02-07: qty 30

## 2021-02-07 MED ORDER — ONDANSETRON HCL 4 MG/2ML IJ SOLN
4.0000 mg | Freq: Four times a day (QID) | INTRAMUSCULAR | Status: DC | PRN
  Filled 2021-02-07: qty 2

## 2021-02-07 MED ORDER — PREGABALIN 75 MG PO CAPS
75.0000 mg | ORAL_CAPSULE | Freq: Two times a day (BID) | ORAL | Status: DC
  Administered 2021-02-07 – 2021-02-09 (×3): 75 mg via ORAL
  Filled 2021-02-07 (×3): qty 1

## 2021-02-07 MED ORDER — EMPAGLIFLOZIN 10 MG PO TABS
10.0000 mg | ORAL_TABLET | Freq: Every day | ORAL | Status: DC
  Administered 2021-02-07 – 2021-02-09 (×3): 10 mg via ORAL
  Filled 2021-02-07 (×3): qty 1

## 2021-02-07 MED ORDER — OCUVITE-LUTEIN PO CAPS
1.0000 | ORAL_CAPSULE | Freq: Two times a day (BID) | ORAL | Status: DC
  Administered 2021-02-07 – 2021-02-09 (×3): 1 via ORAL
  Filled 2021-02-07 (×5): qty 1

## 2021-02-07 NOTE — Sepsis Progress Note (Signed)
Notified bedside nurse of need to order repeat lactic acid and fluid bolus.

## 2021-02-07 NOTE — Progress Notes (Addendum)
Subjective:  Patient ID: Christopher Escobar, male    DOB: [birth year],  MRN: [id number]  Chief Complaint  Patient presents with   Diabetic Ulcer    Left foot diabetic ulcer     65 y.o. male presents for wound care.  Patient presents for follow-up of bilateral midfoot ulceration.  He states the midfoot ulcerations have healed however he is got a new problem with ulceration to the left fifth MPJ is progressive gotten worse there is a malodor present.  There is redness associated with it.  He came in urgently to be evaluated.  He denies any other acute complaints he is not on antibiotics.   Review of Systems: Negative except as noted in the HPI. Denies N/V/F/Ch.  Past Medical History:  Diagnosis Date   Diabetes mellitus without complication ([place]) 7893   Hypertension 2008   Personal history of colonic polyps    TA polyp   Sleep apnea    not using cpap    Current Outpatient Medications:    atorvastatin (LIPITOR) 40 MG tablet, Take 40 mg by mouth daily at 6 PM. , Disp: , Rfl:    collagenase (SANTYL) ointment, Apply 1 application topically daily., Disp: 30 g, Rfl: 1   doxycycline (VIBRAMYCIN) 100 MG capsule, Take 1 capsule (100 mg total) by mouth 2 (two) times daily., Disp: 20 capsule, Rfl: 0   empagliflozin (JARDIANCE) 10 MG TABS tablet, Take by mouth daily., Disp: , Rfl:    glimepiride (AMARYL) 4 MG tablet, Take 2 tablets (8 mg total) by mouth 2 (two) times daily. (Patient taking differently: Take 4 mg by mouth 2 (two) times daily.), Disp: 60 tablet, Rfl: 3   insulin detemir (LEVEMIR) 100 UNIT/ML injection, Inject 50 Units into the skin at bedtime. , Disp: , Rfl:    lisinopril-hydrochlorothiazide (PRINZIDE,ZESTORETIC) 20-12.5 MG tablet, Take 1 tablet every morning by mouth. , Disp: , Rfl:    metoprolol succinate (TOPROL-XL) 25 MG 24 hr tablet, Take 25 mg every morning by mouth. , Disp: , Rfl:    multivitamin-lutein (OCUVITE-LUTEIN) CAPS capsule, Take 1 capsule by mouth 2 (two) times daily.,  Disp: , Rfl:    mupirocin ointment (BACTROBAN) 2 %, Apply 1 application topically 2 (two) times daily., Disp: 22 g, Rfl: 0   NOVOLOG MIX 70/30 FLEXPEN (70-30) 100 UNIT/ML FlexPen, Inject 24 Units into the skin 2 (two) times daily with a meal. , Disp: , Rfl:    oxyCODONE-acetaminophen (PERCOCET) 7.5-325 MG tablet, Take 1 tablet by mouth 5 (five) times daily., Disp: , Rfl:    pregabalin (LYRICA) 75 MG capsule, Take 75 mg by mouth 2 (two) times daily. , Disp: , Rfl:   Social History   Tobacco Use  Smoking Status Former   Packs/day: 1.00   Years: 30.00   Pack years: 30.00   Types: Cigarettes   Quit date: 02/03/2007   Years since quitting: 14.0  Smokeless Tobacco Never    No Known Allergies Objective:  There were no vitals filed for this visit. There is no height or weight on file to calculate BMI. Constitutional Well developed. Well nourished.  Vascular Dorsalis pedis pulses faintly palpable bilaterally. Posterior tibial pulses faintly palpable bilaterally. Capillary refill normal to all digits.  No cyanosis or clubbing noted. Pedal hair growth normal.  Neurologic Normal speech. Oriented to person, place, and time. Protective sensation absent  Dermatologic Wound Location: Left first metatarsophalangeal joint wound probing down to deep tissue malodor present cellulitis noted.  No purulent drainage expressed.  Right  clinically healed  Orthopedic: No pain to palpation either foot.   Radiographs: None Assessment:   1. Diabetic ulcer of left midfoot associated with type 2 diabetes mellitus, with fat layer exposed ([place])   2. Type 2 diabetes mellitus without complication, with long-term current use of insulin (HCC)   3. Cellulitis of toe of left foot       Plan:  Patient was evaluated and treated and all questions answered.  Left lateral fifth metatarsal MPJ wound with cellulitis up to the ankle without purulent drainage -It appears clinically that this has progressed from  friction blister into the wound and has turned into cellulitis.  I discussed with the patient given the fast progression of the wound in the setting of infection I believe patient will benefit from admission into the hospital for IV antibiotics and an MRI evaluation. -I discussed with the patient that he may need a partial ray amputation given that clinically I am able to probe down to bone and there is malodor present. -He is a high risk of losing part of his foot and his leg.  He is a diabetic with last A1c of 10.7.  I encouraged him to control his glucose to allow the wound to heal appropriately.  He states understanding -Partial weightbearing to the heel -We will see him in the hospital once he has been admitted -He may benefit from ABIs PVRs as his vascular status may have changed.  Right midfoot ulceration -Clinically healed  Ulcer left midfoot ulceration with fat layer exposed  -Clinically healed No follow-ups on file.

## 2021-02-07 NOTE — ED Provider Notes (Signed)
[facility] Emergency Department Provider Note   ____________________________________________   Event Date/Time   First MD Initiated Contact with Patient 02/07/21 1044     (approximate)  I have reviewed the triage vital signs and the nursing notes.   HISTORY  Chief Complaint Wound Infection    HPI Christopher Escobar is a 65 y.o. male who presents for left lateral foot wound  LOCATION: left fifth toe and foot DURATION: 6 months prior to arrival  TIMING: worsening since onset and acutely worsening over the last few days SEVERITY: Severe QUALITY: Ulcerating wound CONTEXT: Patient has had a diabetic ulcer of the left foot for the last 6 months and has been waxing and waning in severity and recently became worse requiring oral antibiotics and being followed by Dr. Posey Pronto in podiatry who was seen and clinic visit this morning and told to present to the emergency department for worsening wound and possibility of osteomyelitis MODIFYING FACTORS: Any palpation or walking on this foot worsens the pain and its partially relieved at rest and with Percocets that he has prescribed ASSOCIATED SYMPTOMS: Aching/throbbing pain up the left lower extremity to the knee   Per medical record review, patient has history of type 2 diabetes and hypertension          Past Medical History:  Diagnosis Date   Diabetes mellitus without complication ([place]) 7412   Hypertension 2008   Personal history of colonic polyps    TA polyp   Sleep apnea    not using cpap    Patient Active Problem List   Diagnosis Date Noted   Toe osteomyelitis, left ([place]) 02/07/2021   Right foot infection 09/13/2019   HLD (hyperlipidemia) 09/13/2019   AKI (acute kidney injury) ([place]) 09/13/2019   Hyponatremia 09/13/2019   Class 2 obesity with body mass index (BMI) of 37.0 to 37.9 in adult 09/06/2019   Essential hypertension 06/03/2017   Degenerative joint disease of shoulder, left 12/03/2016    Incomplete tear of left rotator cuff 12/03/2016   Rotator cuff tendinitis, left 12/03/2016   Diabetes mellitus type 2, uncomplicated ([place]) 87/86/7672   History of arthroscopy of left shoulder 11/03/2016   History of colonic polyps 10/08/2016    Past Surgical History:  Procedure Laterality Date   AMPUTATION Right 09/15/2019   Procedure: AMPUTATION RIGHT FOOT 2ND DIGIT;  Surgeon: Felipa Furnace, DPM;  Location: [place];  Service: Podiatry;  Laterality: Right;   BACK SURGERY  1989   COLONOSCOPY  2013   COLONOSCOPY WITH PROPOFOL N/A 10/28/2016   Procedure: COLONOSCOPY WITH PROPOFOL;  Surgeon: Robert Bellow, MD;  Location: [facility];  Service: Endoscopy;  Laterality: N/A;   SHOULDER ARTHROSCOPY WITH SUBACROMIAL DECOMPRESSION Left 04/02/2017   Procedure: SHOULDER ARTHROSCOPY WITH DECOMPRESSION, DEBRIDEMENT, SLAP REPAIR,;  Surgeon: Corky Mull, MD;  Location: [facility];  Service: Orthopedics;  Laterality: Left;   SHOULDER SURGERY Bilateral 2011,2012    Prior to Admission medications   Medication Sig Start Date End Date Taking? Authorizing Provider  atorvastatin (LIPITOR) 40 MG tablet Take 40 mg by mouth daily at 6 PM.     [provider]  collagenase (SANTYL) ointment Apply 1 application topically daily. 07/26/20   Felipa Furnace, DPM  doxycycline (VIBRAMYCIN) 100 MG capsule Take 1 capsule (100 mg total) by mouth 2 (two) times daily. 01/05/21   Margarette Canada, NP  empagliflozin (JARDIANCE) 10 MG TABS tablet Take by mouth daily.    [provider]  glimepiride (AMARYL) 4  MG tablet Take 2 tablets (8 mg total) by mouth 2 (two) times daily. Patient taking differently: Take 4 mg by mouth 2 (two) times daily. 04/02/17   Poggi, Marshall Cork, MD  insulin detemir (LEVEMIR) 100 UNIT/ML injection Inject 50 Units into the skin at bedtime.     [provider]  lisinopril-hydrochlorothiazide (PRINZIDE,ZESTORETIC) 20-12.5 MG tablet Take 1 tablet every morning by mouth.     [provider]  metoprolol succinate (TOPROL-XL) 25 MG 24 hr tablet Take 25 mg every morning by mouth.     [provider]  multivitamin-lutein (OCUVITE-LUTEIN) CAPS capsule Take 1 capsule by mouth 2 (two) times daily.    [provider]  mupirocin ointment (BACTROBAN) 2 % Apply 1 application topically 2 (two) times daily. 01/05/21   Margarette Canada, NP  NOVOLOG MIX 70/30 FLEXPEN (70-30) 100 UNIT/ML FlexPen Inject 24 Units into the skin 2 (two) times daily with a meal.  08/23/19   [provider]  oxyCODONE-acetaminophen (PERCOCET) 7.5-325 MG tablet Take 1 tablet by mouth 5 (five) times daily. 08/28/19   [provider]  pregabalin (LYRICA) 75 MG capsule Take 75 mg by mouth 2 (two) times daily.  05/18/17   [provider]    Allergies Patient has no known allergies.  No family history on file.  Social History Social History   Tobacco Use   Smoking status: Former    Packs/day: 1.00    Years: 30.00    Pack years: 30.00    Types: Cigarettes    Quit date: 02/03/2007    Years since quitting: 14.0   Smokeless tobacco: Never  Vaping Use   Vaping Use: Never used  Substance Use Topics   Alcohol use: Yes    Comment: seldom   Drug use: No    Review of Systems Constitutional: No fever/chills Eyes: No visual changes. ENT: No sore throat. Cardiovascular: Denies chest pain. Respiratory: Denies shortness of breath. Gastrointestinal: No abdominal pain.  No nausea, no vomiting.  No diarrhea. Genitourinary: Negative for dysuria. Musculoskeletal: Positive for acute left foot pain Skin: Negative for rash. Neurological: Negative for headaches, weakness/numbness/paresthesias in any extremity Psychiatric: Negative for suicidal ideation/homicidal ideation   ____________________________________________   PHYSICAL EXAM:  VITAL SIGNS: ED Triage Vitals  Enc Vitals Group     BP 02/07/21 1039 (!) 146/130     Pulse Rate 02/07/21 1039 (!) 114     Resp  02/07/21 1039 20     Temp 02/07/21 1039 97.7 F (36.5 C)     Temp Source 02/07/21 1039 Oral     SpO2 02/07/21 1039 94 %     Weight 02/07/21 1038 245 lb (111.1 kg)     Height 02/07/21 1038 5\' 11"  (1.803 m)     Head Circumference --      Peak Flow --      Pain Score 02/07/21 1038 10     Pain Loc --      Pain Edu? --      Excl. in [place]? --    Constitutional: Alert and oriented. Well appearing and in no acute distress. Eyes: Conjunctivae are normal. PERRL. Head: Atraumatic. Nose: No congestion/rhinnorhea. Mouth/Throat: Mucous membranes are moist. Neck: No stridor Cardiovascular: Grossly normal heart sounds.  Good peripheral circulation. Respiratory: Normal respiratory effort.  No retractions. Gastrointestinal: Soft and nontender. No distention. Musculoskeletal: No obvious deformities Neurologic:  Normal speech and language. No gross focal neurologic deficits are appreciated. Skin: Ulcerating lesion to the lateral aspect of the left  fifth MTP joint and over the lateral aspect of the foot with surrounding erythema over the midfoot.  Skin is warm and dry. Psychiatric: Mood and affect are normal. Speech and behavior are normal. ____________________________________________   LABS (all labs ordered are listed, but only abnormal results are displayed)  Labs Reviewed  COMPREHENSIVE METABOLIC PANEL - Abnormal; Notable for the following components:      Result Value   Sodium 132 (*)    Glucose, Bld 309 (*)    Albumin 3.4 (*)    AST 14 (*)    Total Bilirubin 1.6 (*)    All other components within normal limits  CBC WITH DIFFERENTIAL/PLATELET - Abnormal; Notable for the following components:   WBC 13.7 (*)    Neutro Abs 9.9 (*)    Monocytes Absolute 1.4 (*)    All other components within normal limits  CULTURE, BLOOD (ROUTINE X 2)  CULTURE, BLOOD (ROUTINE X 2)  LACTIC ACID, PLASMA  LACTIC ACID, PLASMA  URINALYSIS, ROUTINE W REFLEX MICROSCOPIC  HEMOGLOBIN A1C  HIV ANTIBODY (ROUTINE  TESTING W REFLEX)   ____________________________________________ RADIOLOGY  ED MD interpretation: MRI of the left foot without contrast shows active osteomyelitis of the head and distal metaphysis of the fifth metatarsal and likely the base of the proximal phalanx of the small toe  Official radiology report(s): MR FOOT LEFT WO CONTRAST  Result Date: 02/07/2021 CLINICAL DATA:  Wound infection of the left foot worsening over the last few days. EXAM: MRI OF THE LEFT FOOT WITHOUT CONTRAST TECHNIQUE: Multiplanar, multisequence MR imaging of the left forefoot was performed. No intravenous contrast was administered. COMPARISON:  Radiographs 01/05/2021 FINDINGS: Despite efforts by the technologist and patient, motion artifact is present on today's exam and could not be eliminated. This reduces exam sensitivity and specificity. Bones/Joint/Cartilage Abnormal marrow edema in the head and distal metaphysis of the fifth metatarsal likely with reduced cortical definition, highly suspicious for active osteomyelitis. Subtle accentuated edema signal in the base of the proximal phalanx on image 14 series 9 likewise reflect osteomyelitis. Mild degenerative spurring along the Lisfranc joint. Likely degenerative 5 mm non-fragmented osteochondral lesion of the distal first metatarsal head on image 14 series 7. Marginal spurring at the first MTP joint. Trabecular accentuation in the first digit lateral sesamoid, but without marrow edema. Ligaments The Lisfranc ligament appears intact. Muscles and Tendons Muscular atrophy with subtle diffuse muscular edema signal which is probably neurogenic. Soft tissues Substantial wound/ulceration lateral to the fifth MTP joint with surrounding subcutaneous edema favoring cellulitis. There is also subcutaneous edema tracking in the dorsum of the foot and into the fifth toe and to a lesser extent into the remaining toes which could also potentially be from cellulitis. IMPRESSION: 1. Active  osteomyelitis of the head and distal metaphysis of the fifth metatarsal and likely of the base of the proximal phalanx of the small toe. Prominent ulceration just lateral to the fifth MTP joint with surrounding cellulitis. 2. Degenerative findings at the first MTP joint and mild degenerative findings along the Lisfranc joint. 3. Atrophy and low-grade edema in the regional musculature likely neurogenic. 4. Dorsal subcutaneous edema along the foot, cellulitis is not excluded. Edema tracks into the fifth toe and to a lesser extent into the remaining toes. Electronically Signed   By: Van Clines M.D.   On: 02/07/2021 14:03    ____________________________________________   PROCEDURES  Procedure(s) performed (including Critical Care):  .1-3 Lead EKG Interpretation Performed by: Naaman Plummer, MD Authorized by: Valora Piccolo  K, MD     Interpretation: normal     ECG rate:  89   ECG rate assessment: normal     Rhythm: sinus rhythm     Ectopy: none     Conduction: normal    CRITICAL CARE Performed by: Naaman Plummer   Total critical care time: 33 minutes  Critical care time was exclusive of separately billable procedures and treating other patients.  Critical care was necessary to treat or prevent imminent or life-threatening deterioration.  Critical care was time spent personally by me on the following activities: development of treatment plan with patient and/or surrogate as well as nursing, discussions with consultants, evaluation of patient's response to treatment, examination of patient, obtaining history from patient or surrogate, ordering and performing treatments and interventions, ordering and review of laboratory studies, ordering and review of radiographic studies, pulse oximetry and re-evaluation of patient's condition.  ____________________________________________   INITIAL IMPRESSION / ASSESSMENT AND PLAN / ED COURSE  As part of my medical decision making, I reviewed  the following data within the electronic medical record, if available:  Nursing notes reviewed and incorporated, Labs reviewed, EKG interpreted, Old chart reviewed, Radiograph reviewed and Notes from prior ED visits reviewed and incorporated     Patient is a 65 year old male with a long history of left foot diabetic ulcer who presents for acute worsening with concern by his podiatrist for osteomyelitis and requesting an MRI  Given History, Exam, and Workup I have low suspicion for Necrotizing Fasciitis, Abscess, DVT or other emergent problem as a cause for this presentation. MRI shows evidence of left fifth digit osteomyelitis and will cover empirically with vancomycin, cefepime, and Flagyl Interventions: Vancomycin 1g IV Disposition: Admit for continued monitoring, workup and likely inpatient PICC line placement.     ____________________________________________   FINAL CLINICAL IMPRESSION(S) / ED DIAGNOSES  Final diagnoses:  Peripheral vascular disease ([place])  Subacute osteomyelitis of left foot [facility])     ED Discharge Orders     None        Note:  This document was prepared using Dragon voice recognition software and may include unintentional dictation errors.    Naaman Plummer, MD 02/07/21 [phone]

## 2021-02-07 NOTE — Progress Notes (Signed)
PHARMACY -  BRIEF ANTIBIOTIC NOTE   Pharmacy has received consult(s) for vancomycin from an ED provider.  The patient's profile has been reviewed for ht/wt/allergies/indication/available labs.    One time order(s) placed for: Vancomycin 2.5 g (1 g followed by 1.5 g)  Further antibiotics/pharmacy consults should be ordered by admitting physician if indicated.                       Thank you, Forde Dandy Carel Carrier 02/07/2021  2:29 PM

## 2021-02-07 NOTE — H&P (Signed)
[place]   PATIENT NAME: Christopher Escobar    MR#:  [id number]  DATE OF BIRTH:  [birth year]  DATE OF ADMISSION:  02/07/2021  PRIMARY CARE PHYSICIAN: Albina Billet, MD   Patient is coming from: Home  REQUESTING/REFERRING PHYSICIAN: Valora Piccolo, MD  CHIEF COMPLAINT:   Chief Complaint  Patient presents with  . Wound Infection    HISTORY OF PRESENT ILLNESS:  Christopher TERNES is a 65 y.o. Caucasian male with medical history significant for diabetes mellitus, hypertension and sleep apnea not on CPAP, who presented to the ER with acute onset of left big toe swelling and redness with pain with x-ray of the lateral ulcer which has been cared for by podiatry.  She was seen today by Dr. Posey Pronto referred to the ER for IV antibiotic therapy and consideration of surgical intervention.  He denies any fever or chills.  No nausea or vomiting or abdominal pain.  No chest pain or dyspnea or cough or wheezing.  No bleeding diathesis. ED Course: When the patient came to the ER blood pressure was 146/130 with a heart rate of 114 otherwise normal vital signs.  Labs revealed leukocytosis of 13.7 with neutrophilia.  And hyponatremia 132 hyperglycemia of 309 and albumin 3.4 with total bili 1.6.  Imaging: Left foot MRI showed the following: 1. Active osteomyelitis of the head and distal metaphysis of the fifth metatarsal and likely of the base of the proximal phalanx of the small toe. Prominent ulceration just lateral to the fifth MTP joint with surrounding cellulitis. 2. Degenerative findings at the first MTP joint and mild degenerative findings along the Lisfranc joint. 3. Atrophy and low-grade edema in the regional musculature likely neurogenic. 4. Dorsal subcutaneous edema along the foot, cellulitis is not excluded. Edema tracks into the fifth toe and to a lesser extent into the remaining toes Arterial ultrasound revealed left borderline resting ABI and maintained waveforms of the left ankle as  well as right mild arterial occlusive disease resting ABI and relatively maintained waveforms of the right ankle.  The patient was given IV Rocephin and vancomycin as well as Flagyl, IV morphine sulfate and Zofran.  He will be admitted to a medical bed for further evaluation and management.   PAST MEDICAL HISTORY:   Past Medical History:  Diagnosis Date  . Diabetes mellitus without complication ([place]) 3710  . Hypertension 2008  . Personal history of colonic polyps    TA polyp  . Sleep apnea    not using cpap    PAST SURGICAL HISTORY:   Past Surgical History:  Procedure Laterality Date  . AMPUTATION Right 09/15/2019   Procedure: AMPUTATION RIGHT FOOT 2ND DIGIT;  Surgeon: Felipa Furnace, DPM;  Location: [facility];  Service: Podiatry;  Laterality: Right;  . BACK SURGERY  1989  . COLONOSCOPY  2013  . COLONOSCOPY WITH PROPOFOL N/A 10/28/2016   Procedure: COLONOSCOPY WITH PROPOFOL;  Surgeon: Robert Bellow, MD;  Location: [facility];  Service: Endoscopy;  Laterality: N/A;  . SHOULDER ARTHROSCOPY WITH SUBACROMIAL DECOMPRESSION Left 04/02/2017   Procedure: SHOULDER ARTHROSCOPY WITH DECOMPRESSION, DEBRIDEMENT, SLAP REPAIR,;  Surgeon: Corky Mull, MD;  Location: [facility];  Service: Orthopedics;  Laterality: Left;  . SHOULDER SURGERY Bilateral 2011,2012    SOCIAL HISTORY:   Social History   Tobacco Use  . Smoking status: Former    Packs/day: 1.00    Years: 30.00    Pack years: 30.00    Types: Cigarettes  Quit date: 02/03/2007    Years since quitting: 14.0  . Smokeless tobacco: Never  Substance Use Topics  . Alcohol use: Yes    Comment: seldom    FAMILY HISTORY:  No family history on file.  No pertinent familial diseases.  DRUG ALLERGIES:  No Known Allergies  REVIEW OF SYSTEMS:   ROS As per history of present illness. All pertinent systems were reviewed above. Constitutional, HEENT, cardiovascular, respiratory, GI, GU, musculoskeletal, neuro, psychiatric, endocrine,  integumentary and hematologic systems were reviewed and are otherwise negative/unremarkable except for positive findings mentioned above in the HPI.   MEDICATIONS AT HOME:   Prior to Admission medications   Medication Sig Start Date End Date Taking? Authorizing Provider  atorvastatin (LIPITOR) 40 MG tablet Take 40 mg by mouth daily at 6 PM.     [provider]  collagenase (SANTYL) ointment Apply 1 application topically daily. 07/26/20   Felipa Furnace, DPM  doxycycline (VIBRAMYCIN) 100 MG capsule Take 1 capsule (100 mg total) by mouth 2 (two) times daily. 01/05/21   Margarette Canada, NP  empagliflozin (JARDIANCE) 10 MG TABS tablet Take by mouth daily.    [provider]  glimepiride (AMARYL) 4 MG tablet Take 2 tablets (8 mg total) by mouth 2 (two) times daily. Patient taking differently: Take 4 mg by mouth 2 (two) times daily. 04/02/17   Poggi, Marshall Cork, MD  insulin detemir (LEVEMIR) 100 UNIT/ML injection Inject 50 Units into the skin at bedtime.     [provider]  lisinopril-hydrochlorothiazide (PRINZIDE,ZESTORETIC) 20-12.5 MG tablet Take 1 tablet every morning by mouth.     [provider]  metoprolol succinate (TOPROL-XL) 25 MG 24 hr tablet Take 25 mg every morning by mouth.     [provider]  multivitamin-lutein (OCUVITE-LUTEIN) CAPS capsule Take 1 capsule by mouth 2 (two) times daily.    [provider]  mupirocin ointment (BACTROBAN) 2 % Apply 1 application topically 2 (two) times daily. 01/05/21   Margarette Canada, NP  NOVOLOG MIX 70/30 FLEXPEN (70-30) 100 UNIT/ML FlexPen Inject 24 Units into the skin 2 (two) times daily with a meal.  08/23/19   [provider]  oxyCODONE-acetaminophen (PERCOCET) 7.5-325 MG tablet Take 1 tablet by mouth 5 (five) times daily. 08/28/19   [provider]  pregabalin (LYRICA) 75 MG capsule Take 75 mg by mouth 2 (two) times daily.  05/18/17   [provider]      VITAL SIGNS:  Blood  pressure 101/61, pulse 90, temperature 97.7 F (36.5 C), temperature source Oral, resp. rate 18, height 5\' 11"  (1.803 m), weight 111.1 kg, SpO2 97 %.  PHYSICAL EXAMINATION:  Physical Exam  GENERAL:  65 y.o.-year-old Caucasian male patient lying in the bed with no acute distress.  EYES: Pupils equal, round, reactive to light and accommodation. No scleral icterus. Extraocular muscles intact.  HEENT: Head atraumatic, normocephalic. Oropharynx and nasopharynx clear.  NECK:  Supple, no jugular venous distention. No thyroid enlargement, no tenderness.  LUNGS: Normal breath sounds bilaterally, no wheezing, rales,rhonchi or crepitation. No use of accessory muscles of respiration.  CARDIOVASCULAR: Regular rate and rhythm, S1, S2 normal. No murmurs, rubs, or gallops.  ABDOMEN: Soft, nondistended, nontender. Bowel sounds present. No organomegaly or mass.  EXTREMITIES: No pedal edema, cyanosis, or clubbing.  NEUROLOGIC: Cranial nerves II through XII are intact. Muscle strength 5/5 in all extremities. Sensation intact. Gait not checked.  PSYCHIATRIC: The patient is alert and oriented x 3.  Normal affect and good eye  contact. SKIN: Left big toe swelling and redness with mild tenderness and lateral foot ulcer with hemorrhagic base with dried blood and extension of the swelling to the plantar part of the foot laterally.    LABORATORY PANEL:   CBC Recent Labs  Lab 02/07/21 1120  WBC 13.7*  HGB 15.6  HCT 46.9  PLT 244   ------------------------------------------------------------------------------------------------------------------  Chemistries  Recent Labs  Lab 02/07/21 1120  NA 132*  K 4.3  CL 99  CO2 24  GLUCOSE 309*  BUN 21  CREATININE 1.16  CALCIUM 9.5  AST 14*  ALT 13  ALKPHOS 63  BILITOT 1.6*   ------------------------------------------------------------------------------------------------------------------  Cardiac Enzymes No results for input(s): TROPONINI in the last 168  hours. ------------------------------------------------------------------------------------------------------------------  RADIOLOGY:  MR FOOT LEFT WO CONTRAST  Result Date: 02/07/2021 CLINICAL DATA:  Wound infection of the left foot worsening over the last few days. EXAM: MRI OF THE LEFT FOOT WITHOUT CONTRAST TECHNIQUE: Multiplanar, multisequence MR imaging of the left forefoot was performed. No intravenous contrast was administered. COMPARISON:  Radiographs 01/05/2021 FINDINGS: Despite efforts by the technologist and patient, motion artifact is present on today's exam and could not be eliminated. This reduces exam sensitivity and specificity. Bones/Joint/Cartilage Abnormal marrow edema in the head and distal metaphysis of the fifth metatarsal likely with reduced cortical definition, highly suspicious for active osteomyelitis. Subtle accentuated edema signal in the base of the proximal phalanx on image 14 series 9 likewise reflect osteomyelitis. Mild degenerative spurring along the Lisfranc joint. Likely degenerative 5 mm non-fragmented osteochondral lesion of the distal first metatarsal head on image 14 series 7. Marginal spurring at the first MTP joint. Trabecular accentuation in the first digit lateral sesamoid, but without marrow edema. Ligaments The Lisfranc ligament appears intact. Muscles and Tendons Muscular atrophy with subtle diffuse muscular edema signal which is probably neurogenic. Soft tissues Substantial wound/ulceration lateral to the fifth MTP joint with surrounding subcutaneous edema favoring cellulitis. There is also subcutaneous edema tracking in the dorsum of the foot and into the fifth toe and to a lesser extent into the remaining toes which could also potentially be from cellulitis. IMPRESSION: 1. Active osteomyelitis of the head and distal metaphysis of the fifth metatarsal and likely of the base of the proximal phalanx of the small toe. Prominent ulceration just lateral to the fifth  MTP joint with surrounding cellulitis. 2. Degenerative findings at the first MTP joint and mild degenerative findings along the Lisfranc joint. 3. Atrophy and low-grade edema in the regional musculature likely neurogenic. 4. Dorsal subcutaneous edema along the foot, cellulitis is not excluded. Edema tracks into the fifth toe and to a lesser extent into the remaining toes. Electronically Signed   By: Van Clines M.D.   On: 02/07/2021 14:03      IMPRESSION AND PLAN:  Active Problems:   Toe osteomyelitis, left (HCC)  1.  Left fifth toe osteomyelitis with subsequent mild sepsis, in the setting of peripheral vascular disease with mildly occlusive right lower extremity ischemic disease. - The patient will be admitted to a medical bed. - We will continue antibiotic therapy with IV vancomycin and Zosyn. - Podiatry consult will be obtained. - I notified Dr. Posey Pronto about the patient. - The patient will be hydrated with IV normal saline and will check blood cultures and follow lactic acid. - Pain management will be provided.  2.  Uncontrolled type 2 diabetes mellitus with hyperglycemia. - The patient will be placed on supplement coverage with NovoLog. - We will continue his  basal coverage. - We will continue his oral antidiabetics.  3.  Mild hyponatremia. - The patient will be hydrated with IV normal saline. - We will follow sodium level.  4.  Essential hypertension. - We will continue his Toprol-XL and Zestoretic.  5.  Dyslipidemia. - We will continue statin therapy.  6.  Peripheral neuropathy. - We will continue Lyrica.  7.  BPH. - We will continue his Flomax.   DVT prophylaxis: Lovenox. Code Status: full code.  Family Communication:  The plan of care was discussed in details with the patient (and family). I answered all questions. The patient agreed to proceed with the above mentioned plan. Further management will depend upon hospital course. Disposition Plan: Back to previous  home environment Consults called: Podiatry. All the records are reviewed and case discussed with ED provider.  Status is: Inpatient   Remains inpatient appropriate because:Ongoing diagnostic testing needed not appropriate for outpatient work up, Unsafe d/c plan, IV treatments appropriate due to intensity of illness or inability to take PO, and Inpatient level of care appropriate due to severity of illness   Dispo: The patient is from: Home              Anticipated d/c is to: Home              Patient currently is not medically stable to d/c.              Difficult to place patient: No  TOTAL TIME TAKING CARE OF THIS PATIENT: 55 minutes.     Christel Mormon M.D on 02/07/2021 at 2:44 PM  [facility]   From 7 PM-7 AM, contact night-coverage [email or web address]  CC: Primary care physician; Albina Billet, MD

## 2021-02-07 NOTE — Progress Notes (Signed)
PHARMACY NOTE:  ANTIMICROBIAL RENAL DOSAGE ADJUSTMENT  Current antimicrobial regimen includes a mismatch between antimicrobial dosage and estimated renal function.  As per policy approved by the Pharmacy & Therapeutics and Medical Executive Committees, the antimicrobial dosage will be adjusted accordingly.  Current antimicrobial dosage:  Zosyn 3.375 g IV q6h  Indication: osteo  Renal Function:  Estimated Creatinine Clearance: 78.3 mL/min (by C-G formula based on SCr of 1.16 mg/dL). []      On intermittent HD, scheduled: []      On CRRT    Antimicrobial dosage has been changed to:  Zosyn 3.375 g IV q8h EI  Additional comments:   Thank you for allowing pharmacy to be a part of this patient's care.  [place], [facility] 02/07/2021 5:19 PM

## 2021-02-07 NOTE — Sepsis Progress Note (Signed)
eLink monitoring code sepsis.  

## 2021-02-07 NOTE — ED Triage Notes (Signed)
Pt to ED for wound infection to left foot for the past month, states worsening over the past few days. Sent for MRI per Dr Posey Pronto. Wound noted to outer left foot.  Diabetic.  Denies fevers

## 2021-02-07 NOTE — Plan of Care (Signed)
Dr. Ree Kida of [facility] of 3.1

## 2021-02-07 NOTE — Progress Notes (Signed)
CODE SEPSIS - PHARMACY COMMUNICATION  **Broad Spectrum Antibiotics should be administered within 1 hour of Sepsis diagnosis**  Time Code Sepsis Called/Page Received: 1817  Antibiotics Ordered:  Ceftriaxone, metronidazole metronidazole  Time of 1st antibiotic administration: [place] ,PharmD Clinical Pharmacist  02/07/2021  5:12 PM

## 2021-02-07 NOTE — ED Notes (Signed)
Informed RN bed assigned 

## 2021-02-07 NOTE — Progress Notes (Signed)
Pharmacy Antibiotic Note  Christopher Escobar is a 65 y.o. male admitted on 02/07/2021 with wound infection.  Pharmacy has been consulted for vancomycin dosing for osteomyelitis.  Plan: Pt scheduled to receive vancomycin 2.5 g IV x 1 loading dose in ED. Will order maintenance dose of 1750 mg IV q24h Est AUC: 533.7 Used: Scr 1.16, Vd 0.5, IBW Obtain vanc level around 4th or 5th dose if continued Monitor renal function and adjust dose as clinically indicated  Height: 5\' 11"  (180.3 cm) Weight: 111.1 kg (245 lb) IBW/kg (Calculated) : 75.3  Temp (24hrs), Avg:97.7 F (36.5 C), Min:97.7 F (36.5 C), Max:97.7 F (36.5 C)  Recent Labs  Lab 02/07/21 1120  WBC 13.7*  CREATININE 1.16  LATICACIDVEN 1.9    Estimated Creatinine Clearance: 78.3 mL/min (by C-G formula based on SCr of 1.16 mg/dL).    No Known Allergies  Antimicrobials this admission: 11/10 ceftriaxone x1 11/10 Zosyn >>  11/10 vancomycin >>    Thank you for allowing pharmacy to be a part of this patient's care.  Forde Dandy Cordale Manera 02/07/2021 5:21 PM

## 2021-02-08 ENCOUNTER — Encounter: Payer: Self-pay | Admitting: Family Medicine

## 2021-02-08 ENCOUNTER — Inpatient Hospital Stay: Payer: Medicare HMO | Admitting: Anesthesiology

## 2021-02-08 ENCOUNTER — Encounter
Admission: EM | Disposition: A | Discharge status: Home / Self Care | Payer: Self-pay | Source: Home / Self Care | Attending: Internal Medicine

## 2021-02-08 DIAGNOSIS — E785 Hyperlipidemia, unspecified: Secondary | ICD-10-CM

## 2021-02-08 DIAGNOSIS — M86172 Other acute osteomyelitis, left ankle and foot: Secondary | ICD-10-CM

## 2021-02-08 DIAGNOSIS — E1169 Type 2 diabetes mellitus with other specified complication: Secondary | ICD-10-CM

## 2021-02-08 DIAGNOSIS — A419 Sepsis, unspecified organism: Principal | ICD-10-CM

## 2021-02-08 DIAGNOSIS — L03032 Cellulitis of left toe: Secondary | ICD-10-CM

## 2021-02-08 DIAGNOSIS — E1142 Type 2 diabetes mellitus with diabetic polyneuropathy: Secondary | ICD-10-CM

## 2021-02-08 HISTORY — PX: AMPUTATION: SHX166

## 2021-02-08 LAB — BASIC METABOLIC PANEL
Anion gap: 9 (ref 5–15)
BUN: 20 mg/dL (ref 8–23)
CO2: 23 mmol/L (ref 22–32)
Calcium: 8.7 mg/dL — ABNORMAL LOW (ref 8.9–10.3)
Chloride: 103 mmol/L (ref 98–111)
Creatinine, Ser: 1.09 mg/dL (ref 0.61–1.24)
GFR, Estimated: >60 mL/min (ref >60)
Glucose, Bld: 179 mg/dL — ABNORMAL HIGH (ref 70–99)
Potassium: 3.7 mmol/L (ref 3.5–5.1)
Sodium: 135 mmol/L (ref 135–145)

## 2021-02-08 LAB — CBC
HCT: 46 % (ref 39.0–52.0)
Hemoglobin: 15.1 g/dL (ref 13.0–17.0)
MCH: 30.1 pg (ref 26.0–34.0)
MCHC: 32.8 g/dL (ref 30.0–36.0)
MCV: 91.6 fL (ref 80.0–100.0)
Platelets: 230 10*3/uL (ref 150–400)
RBC: 5.02 MIL/uL (ref 4.22–5.81)
RDW: 13.6 % (ref 11.5–15.5)
WBC: 12.8 10*3/uL — ABNORMAL HIGH (ref 4.0–10.5)
nRBC: 0 % (ref 0.0–0.2)

## 2021-02-08 LAB — GLUCOSE, CAPILLARY
Glucose-Capillary: 203 mg/dL — ABNORMAL HIGH (ref 70–99)
Glucose-Capillary: 194 mg/dL — ABNORMAL HIGH (ref 70–99)
Glucose-Capillary: 145 mg/dL — ABNORMAL HIGH (ref 70–99)
Glucose-Capillary: 133 mg/dL — ABNORMAL HIGH (ref 70–99)
Glucose-Capillary: 195 mg/dL — ABNORMAL HIGH (ref 70–99)

## 2021-02-08 LAB — HIV ANTIBODY (ROUTINE TESTING W REFLEX): HIV Screen 4th Generation wRfx: NONREACTIVE

## 2021-02-08 SURGERY — AMPUTATION, FOOT, RAY
Anesthesia: General | Laterality: Left

## 2021-02-08 MED ORDER — BUPIVACAINE HCL (PF) 0.5 % IJ SOLN
INTRAMUSCULAR | Status: AC
  Filled 2021-02-08: qty 30

## 2021-02-08 MED ORDER — KETAMINE HCL 10 MG/ML IJ SOLN
INTRAMUSCULAR | Status: DC | PRN
  Administered 2021-02-08: 30 mg via INTRAVENOUS

## 2021-02-08 MED ORDER — LIDOCAINE HCL (PF) 1 % IJ SOLN
INTRAMUSCULAR | Status: AC
  Filled 2021-02-08: qty 30

## 2021-02-08 MED ORDER — PROPOFOL 1000 MG/100ML IV EMUL
INTRAVENOUS | Status: AC
  Filled 2021-02-08: qty 100

## 2021-02-08 MED ORDER — FENTANYL CITRATE (PF) 100 MCG/2ML IJ SOLN: 25.0000 ug | INTRAMUSCULAR | Status: DC | PRN

## 2021-02-08 MED ORDER — KETAMINE HCL 50 MG/5ML IJ SOSY
PREFILLED_SYRINGE | INTRAMUSCULAR | Status: AC
  Filled 2021-02-08: qty 5

## 2021-02-08 MED ORDER — FENTANYL CITRATE (PF) 100 MCG/2ML IJ SOLN
INTRAMUSCULAR | Status: DC | PRN
  Administered 2021-02-08: 50 ug via INTRAVENOUS

## 2021-02-08 MED ORDER — PHENYLEPHRINE HCL (PRESSORS) 10 MG/ML IV SOLN
INTRAVENOUS | Status: DC | PRN
  Administered 2021-02-08: 80 ug via INTRAVENOUS
  Administered 2021-02-08: 160 ug via INTRAVENOUS
  Administered 2021-02-08: 80 ug via INTRAVENOUS
  Administered 2021-02-08: 160 ug via INTRAVENOUS
  Administered 2021-02-08 (×2): 80 ug via INTRAVENOUS

## 2021-02-08 MED ORDER — IBUPROFEN 400 MG PO TABS
600.0000 mg | ORAL_TABLET | Freq: Three times a day (TID) | ORAL | Status: DC | PRN
  Administered 2021-02-08: 600 mg via ORAL
  Filled 2021-02-08: qty 2

## 2021-02-08 MED ORDER — MIDAZOLAM HCL 2 MG/2ML IJ SOLN
INTRAMUSCULAR | Status: DC | PRN
Start: 2021-02-08 — End: 2021-02-08
  Administered 2021-02-08: 2 mg via INTRAVENOUS

## 2021-02-08 MED ORDER — PROPOFOL 500 MG/50ML IV EMUL
INTRAVENOUS | Status: DC | PRN
  Administered 2021-02-08: 120 ug/kg/min via INTRAVENOUS

## 2021-02-08 MED ORDER — FENTANYL CITRATE (PF) 100 MCG/2ML IJ SOLN
INTRAMUSCULAR | Status: AC
  Filled 2021-02-08: qty 2

## 2021-02-08 MED ORDER — 0.9 % SODIUM CHLORIDE (POUR BTL) OPTIME
TOPICAL | Status: DC | PRN
  Administered 2021-02-08: 50 mL

## 2021-02-08 MED ORDER — INFLUENZA VAC A&B SA ADJ QUAD 0.5 ML IM PRSY: 0.5000 mL | PREFILLED_SYRINGE | INTRAMUSCULAR | Status: DC

## 2021-02-08 MED ORDER — SODIUM CHLORIDE 0.9 % IV BOLUS
500.0000 mL | Freq: Once | INTRAVENOUS | Status: AC
  Administered 2021-02-08: 500 mL via INTRAVENOUS

## 2021-02-08 MED ORDER — MIDAZOLAM HCL 2 MG/2ML IJ SOLN
INTRAMUSCULAR | Status: AC
  Filled 2021-02-08: qty 2

## 2021-02-08 MED ORDER — LIDOCAINE HCL 1 % IJ SOLN
INTRAMUSCULAR | Status: DC | PRN
  Administered 2021-02-08: 20 mL via INTRAMUSCULAR

## 2021-02-08 SURGICAL SUPPLY — 36 items
BLADE OSCILLATING/SAGITTAL (BLADE) ×1
BLADE SW THK.38XMED LNG THN (BLADE) IMPLANT
BNDG ELASTIC 4X5.8 VLCR STR LF (GAUZE/BANDAGES/DRESSINGS) ×2 IMPLANT
BNDG ESMARK 4X12 TAN STRL LF (GAUZE/BANDAGES/DRESSINGS) ×2 IMPLANT
BNDG GAUZE ELAST 4 BULKY (GAUZE/BANDAGES/DRESSINGS) ×2 IMPLANT
CNTNR SPEC 2.5X3XGRAD LEK (MISCELLANEOUS) ×1
CONT SPEC 4OZ STER OR WHT (MISCELLANEOUS) ×1
CONTAINER SPEC 2.5X3XGRAD LEK (MISCELLANEOUS) ×1 IMPLANT
CUFF TOURN SGL QUICK 12 (TOURNIQUET CUFF) IMPLANT
DRSG ADAPTIC 3X8 NADH LF (GAUZE/BANDAGES/DRESSINGS) ×1 IMPLANT
DURAPREP 26ML APPLICATOR (WOUND CARE) ×2 IMPLANT
ELECT REM PT RETURN 9FT ADLT (ELECTROSURGICAL) ×2
ELECTRODE REM PT RTRN 9FT ADLT (ELECTROSURGICAL) ×1 IMPLANT
GAUZE 4X4 16PLY ~~LOC~~+RFID DBL (SPONGE) ×2 IMPLANT
GAUZE SPONGE 4X4 12PLY STRL (GAUZE/BANDAGES/DRESSINGS) ×4 IMPLANT
GAUZE XEROFORM 4X4 STRL (GAUZE/BANDAGES/DRESSINGS) ×1 IMPLANT
GLOVE SURG ENC MOIS LTX SZ7 (GLOVE) ×2 IMPLANT
GLOVE SURG UNDER LTX SZ7 (GLOVE) ×2 IMPLANT
GOWN STRL REUS W/ TWL LRG LVL3 (GOWN DISPOSABLE) ×2 IMPLANT
GOWN STRL REUS W/TWL LRG LVL3 (GOWN DISPOSABLE) ×2
KIT TURNOVER KIT A (KITS) ×2 IMPLANT
MANIFOLD NEPTUNE II (INSTRUMENTS) ×2 IMPLANT
NDL FILTER BLUNT 18X1 1/2 (NEEDLE) ×1 IMPLANT
NDL HYPO 25X1 1.5 SAFETY (NEEDLE) ×2 IMPLANT
NEEDLE FILTER BLUNT 18X 1/2SAF (NEEDLE) ×1
NEEDLE FILTER BLUNT 18X1 1/2 (NEEDLE) ×1 IMPLANT
NEEDLE HYPO 25X1 1.5 SAFETY (NEEDLE) ×4 IMPLANT
NS IRRIG 500ML POUR BTL (IV SOLUTION) ×2 IMPLANT
PACK EXTREMITY ARMC (MISCELLANEOUS) ×2 IMPLANT
PAD ABD DERMACEA PRESS 5X9 (GAUZE/BANDAGES/DRESSINGS) ×1 IMPLANT
SOL PREP PVP 2OZ (MISCELLANEOUS) ×2
SOLUTION PREP PVP 2OZ (MISCELLANEOUS) ×1 IMPLANT
STOCKINETTE STRL 6IN 960660 (GAUZE/BANDAGES/DRESSINGS) ×2 IMPLANT
SWAB CULTURE AMIES ANAERIB BLU (MISCELLANEOUS) ×1 IMPLANT
SYR 10ML LL (SYRINGE) ×2 IMPLANT
WATER STERILE IRR 500ML POUR (IV SOLUTION) ×2 IMPLANT

## 2021-02-08 NOTE — Consult Note (Signed)
PODIATRY CONSULTATION  NAME Christopher Escobar MRN [id number] DOB 1955/10/11 DOA 02/07/2021   Reason for consult:  Chief Complaint  Patient presents with   Wound Infection    Consulting physician: Dr. Sidney Ace  History of present illness: 65 y.o. male PMHx diabetes mellitus with recurrent ulcers to the bilateral feet presenting at bedside for worsening infection left forefoot.  Patient was seen yesterday, 02/07/2021 in office.  Recommended that he go to the emergency department to be admitted for IV antibiotics and possible surgical consultation pending MRI results.  MRI is completed.  She  Past Medical History:  Diagnosis Date   Diabetes mellitus without complication ([place]) 3875   Hypertension 2008   Personal history of colonic polyps    TA polyp   Sleep apnea    not using cpap    CBC Latest Ref Rng & Units 02/08/2021 02/07/2021 09/16/2019  WBC 4.0 - 10.5 K/uL 12.8(H) 13.7(H) 9.0  Hemoglobin 13.0 - 17.0 g/dL 15.1 15.6 12.7(L)  Hematocrit 39.0 - 52.0 % 46.0 46.9 38.1(L)  Platelets 150 - 400 K/uL 230 244 257    BMP Latest Ref Rng & Units 02/08/2021 02/07/2021 09/15/2019  Glucose 70 - 99 mg/dL 179(H) 309(H) 187(H)  BUN 8 - 23 mg/dL 20 21 18   Creatinine 0.61 - 1.24 mg/dL 1.09 1.16 0.99  Sodium 135 - 145 mmol/L 135 132(L) 134(L)  Potassium 3.5 - 5.1 mmol/L 3.7 4.3 3.8  Chloride 98 - 111 mmol/L 103 99 105  CO2 22 - 32 mmol/L 23 24 21(L)  Calcium 8.9 - 10.3 mg/dL 8.7(L) 9.5 8.2(L)      Physical Exam: General: The patient is alert and oriented x3 in no acute distress.   Dermatology: Ulcer overlying the fifth MTP joint.  Please see attached media photos  Vascular: US ARTERIAL ABI 02/07/2021: IMPRESSION: Right: Resting ABI on the right in the mild range arterial occlusive Disease. Waveforms at the right ankle are relatively maintained.   Left: Resting ABI borderline on the left. Waveforms at the left ankle are relatively maintained.  Neurological: Epicritic and protective  threshold diminished bilaterally.   Musculoskeletal Exam: No structural deformity noted. No prior amputations  MR FOOT LT WO CONTRAST 02/07/2021: IMPRESSION: 1. Active osteomyelitis of the head and distal metaphysis of the fifth metatarsal and likely of the base of the proximal phalanx of the small toe. Prominent ulceration just lateral to the fifth MTP joint with surrounding cellulitis. 2. Degenerative findings at the first MTP joint and mild degenerative findings along the Lisfranc joint. 3. Atrophy and low-grade edema in the regional musculature likely neurogenic. 4. Dorsal subcutaneous edema along the foot, cellulitis is not excluded. Edema tracks into the fifth toe and to a lesser extent into the remaining toes.  ASSESSMENT/PLAN OF CARE Acute osteomyelitis left fifth ray secondary to diabetes mellitus  -Continue IV antibiotics as per admitting physician  -Discussed with the patient the need for amputation of the partial fifth ray of the left foot to eradicate the osteomyelitis.  Patient understands.  He is already anticipating surgical amputation of the fifth toe.  Discussed the surgery in detail.  Preoperative orders placed.  Surgery will consist of partial fifth ray amputation left foot.  Surgery planned for this afternoon  -N.p.o.  -Podiatry will continue to follow     Thank you for the consult.  Please contact me directly with any questions or concerns.  Cell [phone]   [name], DPM [facility]  Dr. [name],  DPM    [address], [place] [address]                Office [phone]  Fax [phone]

## 2021-02-08 NOTE — Plan of Care (Signed)
Continuing with plan of care. 

## 2021-02-08 NOTE — Op Note (Signed)
OPERATIVE REPORT Patient name: Christopher Escobar MRN: [id number] DOB: 1955-08-03  DOS: 02/08/2021  Preop Dx: Osteomyelitis left fifth ray  postop Dx: same  Procedure:  1.  Partial fifth ray amputation left  Surgeon: [name] DPM  Anesthesia: 50-50 mixture of 2% lidocaine plain with 0.5% Marcaine plain totaling 20 mL infiltrated in the patient's left lower extremity in an ankle block fashion  Hemostasis: Ankle tourniquet inflated to a pressure of 259mmHg without esmarch exsanguination   EBL: Minimal mL Materials: None none Injectables: None Pathology: Fifth metatarsal sent for specimen and culture  Condition: The patient tolerated the procedure and anesthesia well. No complications noted or reported   Justification for procedure: The patient is a 65 y.o. male who presents today for surgical correction of acute osteomyelitis to the left fifth ray. All conservative modalities of been unsuccessful in providing any sort of satisfactory alleviation of symptoms with the patient. The patient was told benefits as well as possible side effects of the surgery. The patient consented for surgical correction. The patient consent form was reviewed. All patient questions were answered. No guarantees were expressed or implied. The patient and the surgeon both signed the patient consent form with the witness present and placed in the patient's chart.   Procedure in Detail: The patient was brought to the operating room, placed in the operating table in the supine position at which time an aseptic scrub and drape were performed about the patient's respective lower extremity after anesthesia was induced as described above. Attention was then directed to the surgical area where procedure number one commenced.  Procedure #1: Partial fifth ray amputation left A racquet type incision was planned and made about the distal portion of the fifth ray encompassing the fifth toe.  Incision was carried down  to the level of bone.  The fifth toe was disarticulated at the fifth MTP joint and removed in toto.  This allowed exposure of the fifth metatarsal.  Careful dissection was performed to expose the proximal third of the fifth metatarsal and an osteotomy was created in an oblique fashion and the distal portion of the metatarsal was removed in toto.  Any necrotic nonviable tissue within the amputation site was excisionally debrided away using a surgical 15 scalpel.  Copious irrigation was then utilized in preparation for primary closure.  Primary closure was obtained using stainless steel skin staples reinforced with 2-0 nylon suture.  Dry sterile compressive dressings were then applied to all previously mentioned incision sites about the patient's lower extremity. The tourniquet which was used for hemostasis was deflated.  The patient was then transferred from the operating room to the recovery room having tolerated the procedure and anesthesia well. All vital signs are stable. After a brief stay in the recovery room the patient was readmitted to inpatient room with adequate prescriptions for analgesia. Verbal as well as written instructions were provided for the patient regarding wound care. The patient is to keep the dressings clean dry and intact.  Podiatry will continue to follow while inpatient  [name], DPM [facility]  Dr. [name], DPM    [address], [place] [address]  Office [phone]  Fax [phone]

## 2021-02-08 NOTE — Brief Op Note (Signed)
02/08/2021  5:18 PM  PATIENT:  Christopher Escobar  65 y.o. male  PRE-OPERATIVE DIAGNOSIS:  Partial 5th Ray Amputation  POST-OPERATIVE DIAGNOSIS:  * No post-op diagnosis entered *  PROCEDURE:  Procedure(s): AMPUTATION RAY-Partial 5th Ray (Left)  SURGEON:  Surgeon(s) and Role:    [name], DPM - Primary  PHYSICIAN ASSISTANT:   ASSISTANTS: none   ANESTHESIA:   local and IV sedation  EBL:  0 mL   BLOOD ADMINISTERED:none  DRAINS: none   LOCAL MEDICATIONS USED:  MARCAINE    and XYLOCAINE   SPECIMEN:  Source of Specimen:  left 5th metatarsal  DISPOSITION OF SPECIMEN:  PATHOLOGY  COUNTS:  YES  TOURNIQUET:   Total Tourniquet Time Documented: Calf (Left) - 20 minutes Total: Calf (Left) - 20 minutes   DICTATION: .Viviann Spare Dictation  PLAN OF CARE: Admit to inpatient   PATIENT DISPOSITION:  PACU - hemodynamically stable.   Delay start of Pharmacological VTE agent (>24hrs) due to surgical blood loss or risk of bleeding: no

## 2021-02-08 NOTE — Progress Notes (Signed)
Inpatient Diabetes Program Recommendations  AACE/ADA: New Consensus Statement on Inpatient Glycemic Control (2015)  Target Ranges:  Prepandial:   less than 140 mg/dL      Peak postprandial:   less than 180 mg/dL (1-2 hours)      Critically ill patients:  140 - 180 mg/dL   Lab Results  Component Value Date   GLUCAP 298 (H) 02/07/2021   HGBA1C 9.1 (H) 02/07/2021    Review of Glycemic Control Results for Christopher Escobar, Christopher "DON" (MRN [id number]) as of 02/08/2021 08:04  Ref. Range 02/07/2021 18:20 02/07/2021 20:35  Glucose-Capillary Latest Ref Range: 70 - 99 mg/dL 262 (H) 298 (H)   Diabetes history: DM 2 Outpatient Diabetes medications:  Amaryl 4 mg bid, Levemir 60 units q HS, Jardiance 25 mg daily, Novolog 70/30 mix- 30 units bid Current orders for Inpatient glycemic control:  Novolog moderate tid with meals, Levemir 50 units q HS, Jardiance 10 mg daily  Inpatient Diabetes Program Recommendations:    Agree with d/c of 70/30 for now due to NPO status.  Will follow.   Thanks,  Adah Perl, RN, BC-ADM Inpatient Diabetes Coordinator Pager [phone]  (8a-5p)

## 2021-02-08 NOTE — Progress Notes (Signed)
Pharmacy Antibiotic Note  Christopher Escobar is a 65 y.o. male admitted on 02/07/2021 with wound infection.  Pharmacy has been consulted for vancomycin dosing for osteomyelitis.  Plan: Will continue with Vancomycin 1750 mg IV q24h Est AUC: 503.8 Used: Scr 1.09, Vd 0.5, IBW Obtain vanc level around 4th or 5th dose if continued Monitor renal function and adjust dose as clinically indicated  Height: 5\' 11"  (180.3 cm) Weight: 111.1 kg (245 lb) IBW/kg (Calculated) : 75.3  Temp (24hrs), Avg:97.8 F (36.6 C), Min:97.5 F (36.4 C), Max:98 F (36.7 C)  Recent Labs  Lab 02/07/21 1120 02/07/21 1636 02/07/21 1814 02/07/21 2122 02/08/21 0412  WBC 13.7*  --   --   --  12.8*  CREATININE 1.16  --   --   --  1.09  LATICACIDVEN 1.9 1.8 3.1* 2.4*  --      Estimated Creatinine Clearance: 83.3 mL/min (by C-G formula based on SCr of 1.09 mg/dL).    No Known Allergies  Antimicrobials this admission: 11/10 ceftriaxone x1 11/10 Zosyn >>  11/10 vancomycin >>    Thank you for allowing pharmacy to be a part of this patient's care.  Christopher Escobar 02/08/2021 2:30 PM

## 2021-02-08 NOTE — Progress Notes (Signed)
Patient ID: Christopher Escobar, male   DOB: May 04, 1955, 65 y.o.   MRN: [id number] [facility] PROGRESS NOTE  MAKIH STEFANKO HQI:[id number] DOB: 1956-03-05 DOA: 02/07/2021 PCP: Albina Billet, MD  HPI/Subjective: Patient seen this morning and was feeling okay.  Does have some pain in his left foot.  Admitted with diabetic foot ulcer that is draining and found to have osteomyelitis.  Objective: Vitals:   02/08/21 1134 02/08/21 1158  BP: (!) 136/103 (!) 89/59  Pulse: 86 84  Resp: 14   Temp: 97.6 F (36.4 C)   SpO2: 94%     Intake/Output Summary (Last 24 hours) at 02/08/2021 1254 Last data filed at 02/07/2021 2000 Gross per 24 hour  Intake 340 ml  Output --  Net 340 ml   Filed Weights   02/07/21 1038  Weight: 111.1 kg    ROS: Review of Systems  Respiratory:  Negative for shortness of breath.   Cardiovascular:  Negative for chest pain.  Gastrointestinal:  Negative for abdominal pain, nausea and vomiting.  Musculoskeletal:  Positive for joint pain.  Exam: Physical Exam HENT:     Head: Normocephalic.     Mouth/Throat:     Pharynx: No oropharyngeal exudate.  Eyes:     General: Lids are normal.     Conjunctiva/sclera: Conjunctivae normal.  Cardiovascular:     Rate and Rhythm: Normal rate and regular rhythm.     Heart sounds: Normal heart sounds, S1 normal and S2 normal.  Pulmonary:     Breath sounds: Normal breath sounds. No decreased breath sounds, wheezing, rhonchi or rales.  Abdominal:     Palpations: Abdomen is soft.     Tenderness: There is no abdominal tenderness.  Musculoskeletal:     Right ankle: No swelling.     Left ankle: No swelling.  Skin:    General: Skin is warm.     Comments: Draining ulcer lateral left foot  Neurological:     Mental Status: He is alert and oriented to person, place, and time.      Scheduled Meds:  atorvastatin  40 mg Oral q1800   collagenase  1 application Topical Daily   empagliflozin  10 mg Oral Daily   enoxaparin  (LOVENOX) injection  0.5 mg/kg Subcutaneous Q24H   hydrochlorothiazide  12.5 mg Oral Daily   [START ON 02/09/2021] influenza vaccine adjuvanted  0.5 mL Intramuscular Tomorrow-1000   insulin aspart  0-15 Units Subcutaneous TID AC & HS   insulin detemir  50 Units Subcutaneous QHS   lisinopril  20 mg Oral Daily   metoprolol succinate  25 mg Oral BH-q7a   multivitamin-lutein  1 capsule Oral BID   mupirocin ointment  1 application Topical BID   pregabalin  75 mg Oral BID   Continuous Infusions:  sodium chloride 100 mL/hr at 02/07/21 2055   piperacillin-tazobactam (ZOSYN)  IV 3.375 g (02/08/21 1250)   vancomycin      Assessment/Plan:  Clinical sepsis present on admission with left fifth toe osteomyelitis, leukocytosis tachycardia and lactic acidosis.  Patient on vancomycin and Zosyn.  Follow-up cultures.  Patient will be brought to the operating room today by Dr. Amalia Hailey for amputation. Type 2 diabetes mellitus with hyperlipidemia.  Continue Lipitor.  Holding short acting insulin prior to meals and emerald with being n.p.o.  On Levemir. Initial hyponatremia resolved Essential hypertension.  Recent blood pressure very high and then the follow-up very low.  Having nursing staff recheck another blood pressure.  Depending on the repeat blood  pressure will decide on whether I need to hold blood pressure medications and give a fluid bolus or continue medications. Peripheral neuropathy on Lyrica BPH without obstruction on Flomax     Code Status:     Code Status Orders  (From admission, onward)           Start     Ordered   02/07/21 1441  Full code  Continuous        02/07/21 1442           Code Status History     Date Active Date Inactive Code Status Order ID Comments User Context   09/13/2019 1310 09/16/2019 2235 Full Code [id number]  Mckinley Jewel, MD ED   04/02/2017 0941 04/02/2017 1455 Full Code [id number]  Poggi, Marshall Cork, MD Inpatient      Family Communication: Updated wife on  the phone Disposition Plan: Status is: Inpatient  Consultants: Podiatry  Antibiotics: Vancomycin and Zosyn  Time spent: 29 minutes.  Asking nursing staff to recheck blood pressure  [place]  [facility]

## 2021-02-08 NOTE — Interval H&P Note (Signed)
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  The various methods of treatment have been discussed with the patient and family. After consideration of risks, benefits and other options for treatment, the patient has consented to  Procedure(s): AMPUTATION RAY-Partial 5th Ray (Left) as a surgical intervention.  The patient's history has been reviewed, patient examined, no change in status, stable for surgery.  I have reviewed the patient's chart and labs.  Questions were answered to the patient's satisfaction.     [name]

## 2021-02-08 NOTE — Anesthesia Preprocedure Evaluation (Addendum)
Anesthesia Evaluation  Patient identified by MRN, date of birth, ID band Patient awake    Reviewed: Allergy & Precautions, NPO status , Patient's Chart, lab work & pertinent test results  History of Anesthesia Complications Negative for: history of anesthetic complications  Airway Mallampati: II  TM Distance: >3 FB Neck ROM: Full    Dental  (+) Dental Advisory Given, Lower Dentures, Upper Dentures   Pulmonary neg shortness of breath, sleep apnea , neg COPD, neg recent URI, former smoker,    Pulmonary exam normal breath sounds clear to auscultation       Cardiovascular hypertension, Pt. on home beta blockers and Pt. on medications (-) angina(-) Past MI and (-) Cardiac Stents Normal cardiovascular exam(-) dysrhythmias (-) Valvular Problems/Murmurs Rhythm:Regular Rate:Normal     Neuro/Psych neg Seizures negative neurological ROS  negative psych ROS   GI/Hepatic negative GI ROS, Neg liver ROS,   Endo/Other  diabetes, Poorly Controlled, Type 2, Oral Hypoglycemic Agents, Insulin DependentObesity   Renal/GU negative Renal ROS     Musculoskeletal negative musculoskeletal ROS (+)   Abdominal   Peds  Hematology  (+) Blood dyscrasia, anemia ,   Anesthesia Other Findings Pt w/ Acute osteomyelitis left fifth ray secondary to diabetes mellitus for Partial 5th Ray Amputation.  Past Medical History: 2010: Diabetes mellitus without complication ([place]) 3888: Hypertension No date: Personal history of colonic polyps     Comment:  TA polyp No date: Sleep apnea     Comment:  not using cpap   Reproductive/Obstetrics negative OB ROS                            Anesthesia Physical  Anesthesia Plan  ASA: 3  Anesthesia Plan: General   Post-op Pain Management:    Induction: Intravenous  PONV Risk Score and Plan: 2 and TIVA and Propofol infusion  Airway Management Planned: Natural Airway and Simple Face  Mask  Additional Equipment:   Intra-op Plan:   Post-operative Plan: Extubation in OR  Informed Consent:   Plan Discussed with:   Anesthesia Plan Comments: ( )       Anesthesia Quick Evaluation

## 2021-02-08 NOTE — Transfer of Care (Signed)
Immediate Anesthesia Transfer of Care Note  Patient: Christopher Escobar  Procedure(s) Performed: AMPUTATION RAY-Partial 5th Ray (Left)  Patient Location: PACU  Anesthesia Type:General  Level of Consciousness: awake, alert  and oriented  Airway & Oxygen Therapy: Patient Spontanous Breathing and Patient connected to face mask oxygen  Post-op Assessment: Report given to RN and Post -op Vital signs reviewed and stable  Post vital signs: Reviewed and stable  Last Vitals:  Vitals Value Taken Time  BP 99/61 02/08/21 1721  Temp    Pulse 78 02/08/21 1723  Resp 19 02/08/21 1723  SpO2 96 % 02/08/21 1723  Vitals shown include unvalidated device data.  Last Pain:  Vitals:   02/08/21 1540  TempSrc: Oral  PainSc: 4       Patients Stated Pain Goal: 0 (21/58/72 7618)  Complications: No notable events documented.

## 2021-02-08 NOTE — H&P (View-Only) (Signed)
Patient ID: Christopher Escobar, male   DOB: 29-Jun-1955, 65 y.o.   MRN: [id number] [facility] PROGRESS NOTE  Christopher Escobar:[id number] DOB: Dec 08, 1955 DOA: 02/07/2021 PCP: Albina Billet, MD  HPI/Subjective: Patient seen this morning and was feeling okay.  Does have some pain in his left foot.  Admitted with diabetic foot ulcer that is draining and found to have osteomyelitis.  Objective: Vitals:   02/08/21 1134 02/08/21 1158  BP: (!) 136/103 (!) 89/59  Pulse: 86 84  Resp: 14   Temp: 97.6 F (36.4 C)   SpO2: 94%     Intake/Output Summary (Last 24 hours) at 02/08/2021 1254 Last data filed at 02/07/2021 2000 Gross per 24 hour  Intake 340 ml  Output --  Net 340 ml   Filed Weights   02/07/21 1038  Weight: 111.1 kg    ROS: Review of Systems  Respiratory:  Negative for shortness of breath.   Cardiovascular:  Negative for chest pain.  Gastrointestinal:  Negative for abdominal pain, nausea and vomiting.  Musculoskeletal:  Positive for joint pain.  Exam: Physical Exam HENT:     Head: Normocephalic.     Mouth/Throat:     Pharynx: No oropharyngeal exudate.  Eyes:     General: Lids are normal.     Conjunctiva/sclera: Conjunctivae normal.  Cardiovascular:     Rate and Rhythm: Normal rate and regular rhythm.     Heart sounds: Normal heart sounds, S1 normal and S2 normal.  Pulmonary:     Breath sounds: Normal breath sounds. No decreased breath sounds, wheezing, rhonchi or rales.  Abdominal:     Palpations: Abdomen is soft.     Tenderness: There is no abdominal tenderness.  Musculoskeletal:     Right ankle: No swelling.     Left ankle: No swelling.  Skin:    General: Skin is warm.     Comments: Draining ulcer lateral left foot  Neurological:     Mental Status: He is alert and oriented to person, place, and time.      Scheduled Meds:  atorvastatin  40 mg Oral q1800   collagenase  1 application Topical Daily   empagliflozin  10 mg Oral Daily   enoxaparin  (LOVENOX) injection  0.5 mg/kg Subcutaneous Q24H   hydrochlorothiazide  12.5 mg Oral Daily   [START ON 02/09/2021] influenza vaccine adjuvanted  0.5 mL Intramuscular Tomorrow-1000   insulin aspart  0-15 Units Subcutaneous TID AC & HS   insulin detemir  50 Units Subcutaneous QHS   lisinopril  20 mg Oral Daily   metoprolol succinate  25 mg Oral BH-q7a   multivitamin-lutein  1 capsule Oral BID   mupirocin ointment  1 application Topical BID   pregabalin  75 mg Oral BID   Continuous Infusions:  sodium chloride 100 mL/hr at 02/07/21 2055   piperacillin-tazobactam (ZOSYN)  IV 3.375 g (02/08/21 1250)   vancomycin      Assessment/Plan:  Clinical sepsis present on admission with left fifth toe osteomyelitis, leukocytosis tachycardia and lactic acidosis.  Patient on vancomycin and Zosyn.  Follow-up cultures.  Patient will be brought to the operating room today by Dr. Amalia Hailey for amputation. Type 2 diabetes mellitus with hyperlipidemia.  Continue Lipitor.  Holding short acting insulin prior to meals and emerald with being n.p.o.  On Levemir. Initial hyponatremia resolved Essential hypertension.  Recent blood pressure very high and then the follow-up very low.  Having nursing staff recheck another blood pressure.  Depending on the repeat blood  pressure will decide on whether I need to hold blood pressure medications and give a fluid bolus or continue medications. Peripheral neuropathy on Lyrica BPH without obstruction on Flomax     Code Status:     Code Status Orders  (From admission, onward)           Start     Ordered   02/07/21 1441  Full code  Continuous        02/07/21 1442           Code Status History     Date Active Date Inactive Code Status Order ID Comments User Context   09/13/2019 1310 09/16/2019 2235 Full Code [id number]  Mckinley Jewel, MD ED   04/02/2017 0941 04/02/2017 1455 Full Code [id number]  Poggi, Marshall Cork, MD Inpatient      Family Communication: Updated wife on  the phone Disposition Plan: Status is: Inpatient  Consultants: Podiatry  Antibiotics: Vancomycin and Zosyn  Time spent: 29 minutes.  Asking nursing staff to recheck blood pressure  [place]  [facility]

## 2021-02-08 NOTE — Anesthesia Postprocedure Evaluation (Signed)
Anesthesia Post Note  Patient: Christopher Escobar  Procedure(s) Performed: AMPUTATION RAY-Partial 5th Ray (Left)  Patient location during evaluation: PACU Anesthesia Type: General Level of consciousness: awake and alert Pain management: pain level controlled Vital Signs Assessment: post-procedure vital signs reviewed and stable Respiratory status: spontaneous breathing, nonlabored ventilation, respiratory function stable and patient connected to nasal cannula oxygen Cardiovascular status: blood pressure returned to baseline and stable Postop Assessment: no apparent nausea or vomiting Anesthetic complications: no   No notable events documented.   Last Vitals:  Vitals:   02/08/21 1833 02/08/21 1837  BP: 119/73   Pulse: 76   Resp: 18   Temp:  36.5 C  SpO2: 96%     Last Pain:  Vitals:   02/08/21 1837  TempSrc: Oral  PainSc:                  [name]

## 2021-02-08 NOTE — Progress Notes (Signed)
Patient ID: Christopher Escobar, male   DOB: [birth year], 65 y.o.   MRN: [id number]  Repeat blood pressure still low.  We will give a fluid bolus 500 cc.  Hold lisinopril-hydrochlorothiazide and Toprol.  Nursing staff stated she held these medications this morning already.  Dr Loletha Grayer

## 2021-02-09 ENCOUNTER — Encounter: Payer: Self-pay | Admitting: Podiatry

## 2021-02-09 DIAGNOSIS — N400 Enlarged prostate without lower urinary tract symptoms: Secondary | ICD-10-CM

## 2021-02-09 DIAGNOSIS — A401 Sepsis due to streptococcus, group B: Secondary | ICD-10-CM

## 2021-02-09 DIAGNOSIS — I959 Hypotension, unspecified: Secondary | ICD-10-CM

## 2021-02-09 LAB — BASIC METABOLIC PANEL
Anion gap: 9 (ref 5–15)
BUN: 18 mg/dL (ref 8–23)
CO2: 22 mmol/L (ref 22–32)
Calcium: 8.1 mg/dL — ABNORMAL LOW (ref 8.9–10.3)
Chloride: 105 mmol/L (ref 98–111)
Creatinine, Ser: 1.07 mg/dL (ref 0.61–1.24)
GFR, Estimated: >60 mL/min (ref >60)
Glucose, Bld: 148 mg/dL — ABNORMAL HIGH (ref 70–99)
Potassium: 3.7 mmol/L (ref 3.5–5.1)
Sodium: 136 mmol/L (ref 135–145)

## 2021-02-09 LAB — CBC
HCT: 44.1 % (ref 39.0–52.0)
Hemoglobin: 14.5 g/dL (ref 13.0–17.0)
MCH: 30.5 pg (ref 26.0–34.0)
MCHC: 32.9 g/dL (ref 30.0–36.0)
MCV: 92.6 fL (ref 80.0–100.0)
Platelets: 234 10*3/uL (ref 150–400)
RBC: 4.76 MIL/uL (ref 4.22–5.81)
RDW: 13.7 % (ref 11.5–15.5)
WBC: 10.5 10*3/uL (ref 4.0–10.5)
nRBC: 0 % (ref 0.0–0.2)

## 2021-02-09 LAB — GLUCOSE, CAPILLARY
Glucose-Capillary: 136 mg/dL — ABNORMAL HIGH (ref 70–99)
Glucose-Capillary: 225 mg/dL — ABNORMAL HIGH (ref 70–99)

## 2021-02-09 MED ORDER — SENNA 8.6 MG PO TABS: 2.0000 | ORAL_TABLET | Freq: Every day | ORAL | 0 refills | Status: AC

## 2021-02-09 MED ORDER — ACETAMINOPHEN 325 MG PO TABS: 650.0000 mg | ORAL_TABLET | Freq: Four times a day (QID) | ORAL | Status: AC | PRN

## 2021-02-09 MED ORDER — POLYETHYLENE GLYCOL 3350 17 G PO PACK: 17.0000 g | PACK | Freq: Every day | ORAL | 0 refills | Status: DC | PRN

## 2021-02-09 MED ORDER — AMOXICILLIN-POT CLAVULANATE 875-125 MG PO TABS
1.0000 | ORAL_TABLET | Freq: Two times a day (BID) | ORAL | 0 refills | Status: AC
Start: 2021-02-09 — End: 2021-02-23

## 2021-02-09 MED ORDER — OXYCODONE HCL 5 MG PO TABS
10.0000 mg | ORAL_TABLET | Freq: Once | ORAL | Status: AC
  Administered 2021-02-09: 10 mg via ORAL
  Filled 2021-02-09: qty 2

## 2021-02-09 MED ORDER — OXYCODONE HCL 10 MG PO TABS: 10.0000 mg | ORAL_TABLET | Freq: Four times a day (QID) | ORAL | 0 refills | Status: AC | PRN

## 2021-02-09 NOTE — Progress Notes (Signed)
  Subjective:  Patient ID: Christopher Escobar, male    DOB: [birth year],  MRN: [id number]  Patient seen resting comfortably at bedside with his wife who is present  Negative for chest pain and shortness of breath Review of all other systems is negative Objective:   Vitals:   02/09/21 0423 02/09/21 0747  BP: 114/66 126/72  Pulse: 89 89  Resp: 20 20  Temp: 98.2 F (36.8 C) 97.7 F (36.5 C)  SpO2: 93% 95%   General AA&O x3. Normal mood and affect.  Vascular Foot is warm Brisk capillary refill to all digits. Pedal hair present.  Neurologic Epicritic sensation grossly reduced.  Dermatologic Dressing clean dry and intact without strikethrough  Orthopedic: MMT 5/5 in dorsiflexion, plantarflexion, inversion, and eversion. Normal joint ROM without pain or crepitus.    Assessment & Plan:  Patient was evaluated and treated and all questions answered.  POD #1 left partial fifth ray amputation -Doing well he is stable for discharge -I recommend 2 weeks of p.o. antibiotics likely Augmentin -No dressing changes needed -WBAT in forefoot offloading shoe, postop shoe or cam boot -Follow-up with Dr. Posey Pronto in the office next week we will have our office schedule  Criselda Peaches, DPM  Accessible via secure chat for questions or concerns.

## 2021-02-09 NOTE — Discharge Instructions (Signed)
Keep dressing intact until seen by podiatry next week No weight bearing on toes.  Use walking boot with ambulation.

## 2021-02-09 NOTE — Plan of Care (Signed)
Continuing with plan of care. 

## 2021-02-09 NOTE — Plan of Care (Signed)
Discharge teaching completed with patient who is in stable condition.  CAM boot also provided to patient at discharge and placed on foot.

## 2021-02-09 NOTE — Discharge Summary (Signed)
[facility] NAME: Christopher Escobar    MR#:  [id number]  DATE OF BIRTH:  [birth year]  DATE OF ADMISSION:  02/07/2021 ADMITTING PHYSICIAN: Christel Mormon, MD  DATE OF DISCHARGE: 02/09/2021  2:39 PM  PRIMARY CARE PHYSICIAN: Albina Billet, MD    ADMISSION DIAGNOSIS:  Peripheral vascular disease ([place]) [I73.9] Toe osteomyelitis, left ([place]) [M86.9] Subacute osteomyelitis of left foot ([place]) [W09.811]  DISCHARGE DIAGNOSIS:  Active Problems:   Type 2 diabetes mellitus with hyperlipidemia (HCC)   Pyogenic inflammation of bone (HCC)   Sepsis without acute organ dysfunction (HCC)   Diabetic polyneuropathy associated with type 2 diabetes mellitus ([place])   SECONDARY DIAGNOSIS:   Past Medical History:  Diagnosis Date  . Diabetes mellitus without complication ([place]) 9147  . Hypertension 2008  . Personal history of colonic polyps    TA polyp  . Sleep apnea    not using cpap    HOSPITAL COURSE:   Clinical sepsis, present on admission with left fifth toe osteomyelitis, leukocytosis tachycardia and lactic acidosis.  Patient was started on vancomycin and Zosyn.  Patient was brought to the operating room by Dr. Amalia Hailey and had a partial fifth ray amputation on the left.  Dr. Sherryle Lis saw the patient in follow-up and cleared the patient for disposition home with walking boot and 2 weeks of Augmentin.  The patient will follow-up in the podiatry office with Dr. Posey Pronto for dressing change.  I did prescribe a few days of oxycodone 10 mg as needed.  So far strep agalactiae growing out of culture. Type 2 diabetes mellitus with hyperlipidemia.  Continue Lipitor.  Continue Levemir insulin 60 units at night and short acting insulin prior to meals Initial hyponatremia has resolved.  Sodium 132 on presentation and 136 upon discharge. Hypotension yesterday requiring fluid bolus.  History of essential hypertension.  Hold lisinopril HCT upon discharge home and can go  back on Toprol-XL.  Recheck blood pressure and follow-up appointment.  Last blood pressure 126/72. Peripheral neuropathy on Lyrica BPH without obstruction on Flomax  DISCHARGE CONDITIONS:   Satisfactory  CONSULTS OBTAINED:  Podiatry  DRUG ALLERGIES:  No Known Allergies  DISCHARGE MEDICATIONS:   Allergies as of 02/09/2021   No Known Allergies      Medication List     STOP taking these medications    doxycycline 100 MG capsule Commonly known as: VIBRAMYCIN   glimepiride 4 MG tablet Commonly known as: AMARYL   lisinopril-hydrochlorothiazide 20-12.5 MG tablet Commonly known as: ZESTORETIC   NovoLOG Mix 70/30 FlexPen (70-30) 100 UNIT/ML FlexPen Generic drug: insulin aspart protamine - aspart   oxyCODONE-acetaminophen 7.5-325 MG tablet Commonly known as: PERCOCET       TAKE these medications    acetaminophen 325 MG tablet Commonly known as: TYLENOL Take 2 tablets (650 mg total) by mouth every 6 (six) hours as needed for mild pain (or Fever >/= 101).   amoxicillin-clavulanate 875-125 MG tablet Commonly known as: Augmentin Take 1 tablet by mouth 2 (two) times daily for 14 days.   atorvastatin 40 MG tablet Commonly known as: LIPITOR Take 40 mg by mouth daily at 6 PM.   insulin detemir 100 UNIT/ML injection Commonly known as: LEVEMIR Inject 60 Units into the skin at bedtime.   Jardiance 25 MG Tabs tablet Generic drug: empagliflozin Take 25 mg by mouth daily.   metoprolol succinate 25 MG 24 hr tablet Commonly known as: TOPROL-XL Take 25 mg every morning by mouth.  multivitamin-lutein Caps capsule Take 1 capsule by mouth 2 (two) times daily.   mupirocin ointment 2 % Commonly known as: BACTROBAN Apply 1 application topically 2 (two) times daily.   NovoLOG FlexPen 100 UNIT/ML FlexPen Generic drug: insulin aspart Inject 7-10 Units into the skin 2 (two) times daily before a meal.   Oxycodone HCl 10 MG Tabs Take 1 tablet (10 mg total) by mouth every 6  (six) hours as needed for up to 4 days for severe pain.   polyethylene glycol 17 g packet Commonly known as: MiraLax Take 17 g by mouth daily as needed.   pregabalin 75 MG capsule Commonly known as: LYRICA Take 75 mg by mouth 2 (two) times daily.   Santyl ointment Generic drug: collagenase Apply 1 application topically daily.   senna 8.6 MG Tabs tablet Commonly known as: SENOKOT Take 2 tablets (17.2 mg total) by mouth at bedtime.   tamsulosin 0.4 MG Caps capsule Commonly known as: FLOMAX Take 0.4 mg by mouth daily.         DISCHARGE INSTRUCTIONS:   Follow-up PMD 5 days Follow-up podiatry next week for dressing change  If you experience worsening of your admission symptoms, develop shortness of breath, life threatening emergency, suicidal or homicidal thoughts you must seek medical attention immediately by calling 911 or calling your MD immediately  if symptoms less severe.  You Must read complete instructions/literature along with all the possible adverse reactions/side effects for all the Medicines you take and that have been prescribed to you. Take any new Medicines after you have completely understood and accept all the possible adverse reactions/side effects.   Please note  You were cared for by a hospitalist during your hospital stay. If you have any questions about your discharge medications or the care you received while you were in the hospital after you are discharged, you can call the unit and asked to speak with the hospitalist on call if the hospitalist that took care of you is not available. Once you are discharged, your primary care physician will handle any further medical issues. Please note that NO REFILLS for any discharge medications will be authorized once you are discharged, as it is imperative that you return to your primary care physician (or establish a relationship with a primary care physician if you do not have one) for your aftercare needs so that  they can reassess your need for medications and monitor your lab values.    Today   CHIEF COMPLAINT:   Chief Complaint  Patient presents with  . Wound Infection    HISTORY OF PRESENT ILLNESS:  Christopher Escobar  is a 65 y.o. male sent in for wound infection found to have an osteomyelitis of the left fifth toe   VITAL SIGNS:  Blood pressure 126/72, pulse 89, temperature 97.7 F (36.5 C), temperature source Oral, resp. rate 20, height 5\' 11"  (1.803 m), weight 111.1 kg, SpO2 95 %.  I/O:   Intake/Output Summary (Last 24 hours) at 02/09/2021 1640 Last data filed at 02/09/2021 1230 Gross per 24 hour  Intake 2220.11 ml  Output 1400 ml  Net 820.11 ml    PHYSICAL EXAMINATION:  GENERAL:  65 y.o.-year-old patient lying in the bed with no acute distress.  EYES: Pupils equal, round, reactive to light and accommodation. No scleral icterus. Extraocular muscles intact.  HEENT: Head atraumatic, normocephalic. Oropharynx and nasopharynx clear.  LUNGS: Normal breath sounds bilaterally, no wheezing, rales,rhonchi or crepitation. No use of accessory muscles of respiration.  CARDIOVASCULAR:  S1, S2 normal. No murmurs, rubs, or gallops.  ABDOMEN: Soft, non-tender, non-distended. EXTREMITIES: No pedal edema, cyanosis, or clubbing.  NEUROLOGIC: Cranial nerves II through XII are intact. Muscle strength 5/5 in all extremities. Sensation intact. Gait not checked.  PSYCHIATRIC: The patient is alert and oriented x 3.  SKIN: Left foot wound covered in pressure dressing  DATA REVIEW:   CBC Recent Labs  Lab 02/09/21 0651  WBC 10.5  HGB 14.5  HCT 44.1  PLT 234    Chemistries  Recent Labs  Lab 02/07/21 1120 02/08/21 0412 02/09/21 0651  NA 132*   < > 136  K 4.3   < > 3.7  CL 99   < > 105  CO2 24   < > 22  GLUCOSE 309*   < > 148*  BUN 21   < > 18  CREATININE 1.16   < > 1.07  CALCIUM 9.5   < > 8.1*  AST 14*  --   --   ALT 13  --   --   ALKPHOS 63  --   --   BILITOT 1.6*  --   --    < >  = values in this interval not displayed.     Microbiology Results  Results for orders placed or performed during the hospital encounter of 02/07/21  Resp Panel by RT-PCR (Flu A&B, Covid) Nasopharyngeal Swab     Status: None   Collection Time: 02/07/21  4:36 PM   Specimen: Nasopharyngeal Swab; Nasopharyngeal(NP) swabs in vial transport medium  Result Value Ref Range Status   SARS Coronavirus 2 by RT PCR NEGATIVE NEGATIVE Final    Comment: (NOTE) SARS-CoV-2 target nucleic acids are NOT DETECTED.  The SARS-CoV-2 RNA is generally detectable in upper respiratory specimens during the acute phase of infection. The lowest concentration of SARS-CoV-2 viral copies this assay can detect is 138 copies/mL. A negative result does not preclude SARS-Cov-2 infection and should not be used as the sole basis for treatment or other patient management decisions. A negative result may occur with  improper specimen collection/handling, submission of specimen other than nasopharyngeal swab, presence of viral mutation(s) within the areas targeted by this assay, and inadequate number of viral copies(<138 copies/mL). A negative result must be combined with clinical observations, patient history, and epidemiological information. The expected result is Negative.  Fact Sheet for Patients:  EntrepreneurPulse.com.au  Fact Sheet for Healthcare Providers:  IncredibleEmployment.be  This test is no t yet approved or cleared by the [place] FDA and  has been authorized for detection and/or diagnosis of SARS-CoV-2 by FDA under an Emergency Use Authorization (EUA). This EUA will remain  in effect (meaning this test can be used) for the duration of the COVID-19 declaration under Section 564(b)(1) of the Act, 21 U.S.C.section 360bbb-3(b)(1), unless the authorization is terminated  or revoked sooner.       Influenza A by PCR NEGATIVE NEGATIVE Final   Influenza B by PCR  NEGATIVE NEGATIVE Final    Comment: (NOTE) The Xpert Xpress SARS-CoV-2/FLU/RSV plus assay is intended as an aid in the diagnosis of influenza from Nasopharyngeal swab specimens and should not be used as a sole basis for treatment. Nasal washings and aspirates are unacceptable for Xpert Xpress SARS-CoV-2/FLU/RSV testing.  Fact Sheet for Patients: EntrepreneurPulse.com.au  Fact Sheet for Healthcare Providers: IncredibleEmployment.be  This test is not yet approved or cleared by the [place] FDA and has been authorized for detection and/or diagnosis of SARS-CoV-2 by FDA under an  Emergency Use Authorization (EUA). This EUA will remain in effect (meaning this test can be used) for the duration of the COVID-19 declaration under Section 564(b)(1) of the Act, 21 U.S.C. section 360bbb-3(b)(1), unless the authorization is terminated or revoked.  Performed at [facility], [address] [place], [place] [address]   Blood Cultures x 2 sites     Status: None (Preliminary result)   Collection Time: 02/07/21  4:37 PM   Specimen: BLOOD  Result Value Ref Range Status   Specimen Description BLOOD RIGHT ANTECUBITAL  Final   Special Requests   Final    BOTTLES DRAWN AEROBIC AND ANAEROBIC Blood Culture adequate volume   Culture   Final    NO GROWTH 2 DAYS Performed at [facility], [address], [place], [place] [address]    Report Status PENDING  Incomplete  Blood Cultures x 2 sites     Status: None (Preliminary result)   Collection Time: 02/07/21  4:37 PM   Specimen: BLOOD  Result Value Ref Range Status   Specimen Description BLOOD LEFT ANTECUBITAL  Final   Special Requests   Final    BOTTLES DRAWN AEROBIC AND ANAEROBIC Blood Culture adequate volume   Culture   Final    NO GROWTH 2 DAYS Performed at [facility], [address] [place], [place] [address]    Report Status PENDING  Incomplete  Aerobic/Anaerobic  Culture w Gram Stain (surgical/deep wound)     Status: None (Preliminary result)   Collection Time: 02/08/21  5:02 PM   Specimen: PATH Digit amputation; Tissue  Result Value Ref Range Status   Specimen Description WOUND  Final   Special Requests RT 5TH METATARSAL  SWAB  Final   Gram Stain PENDING  Incomplete   Culture   Final    TOO YOUNG TO READ Performed at [facility], [address], [place], [place] [address]    Report Status PENDING  Incomplete  Aerobic/Anaerobic Culture w Gram Stain (surgical/deep wound)     Status: None (Preliminary result)   Collection Time: 02/08/21  5:02 PM   Specimen: PATH Digit amputation; Tissue  Result Value Ref Range Status   Specimen Description TISSUE  Final   Special Requests RT 5TH METATARSAL BONE  Final   Gram Stain   Final    FEW WBC PRESENT,BOTH PMN AND MONONUCLEAR RARE GRAM POSITIVE COCCI    Culture   Final    FEW STREPTOCOCCUS AGALACTIAE TESTING AGAINST S. AGALACTIAE NOT ROUTINELY PERFORMED DUE TO PREDICTABILITY OF AMP/PEN/VAN SUSCEPTIBILITY. Performed at [facility], [place] [address] [place], [place] [address]    Report Status PENDING  Incomplete      Management plans discussed with the patient, family and they are in agreement.  CODE STATUS:     Code Status Orders  (From admission, onward)           Start     Ordered   02/07/21 1441  Full code  Continuous        02/07/21 1442           Code Status History     Date Active Date Inactive Code Status Order ID Comments User Context   09/13/2019 1310 09/16/2019 2235 Full Code [id number]  Mckinley Jewel, MD ED   04/02/2017 0941 04/02/2017 1455 Full Code [id number]  Poggi, Marshall Cork, MD Inpatient       TOTAL TIME TAKING CARE OF THIS PATIENT: 34 minutes.    Christopher Escobar  Christopher Escobar M.D on 02/09/2021 at 4:40 PM    [facility]  CC: Primary care physician; Albina Billet, MD

## 2021-02-12 LAB — CULTURE, BLOOD (ROUTINE X 2)
Culture: NO GROWTH
Culture: NO GROWTH
Special Requests: ADEQUATE
Special Requests: ADEQUATE

## 2021-02-12 LAB — SURGICAL PATHOLOGY

## 2021-02-13 LAB — AEROBIC/ANAEROBIC CULTURE W GRAM STAIN (SURGICAL/DEEP WOUND)

## 2021-02-14 ENCOUNTER — Ambulatory Visit (INDEPENDENT_AMBULATORY_CARE_PROVIDER_SITE_OTHER): Payer: Medicare HMO | Admitting: Podiatry

## 2021-02-14 ENCOUNTER — Other Ambulatory Visit: Payer: Self-pay

## 2021-02-14 ENCOUNTER — Ambulatory Visit (INDEPENDENT_AMBULATORY_CARE_PROVIDER_SITE_OTHER): Payer: Medicare HMO

## 2021-02-14 DIAGNOSIS — E11621 Type 2 diabetes mellitus with foot ulcer: Secondary | ICD-10-CM | POA: Diagnosis not present

## 2021-02-14 DIAGNOSIS — L97422 Non-pressure chronic ulcer of left heel and midfoot with fat layer exposed: Secondary | ICD-10-CM

## 2021-02-14 DIAGNOSIS — Z9889 Other specified postprocedural states: Secondary | ICD-10-CM

## 2021-02-14 LAB — AEROBIC/ANAEROBIC CULTURE W GRAM STAIN (SURGICAL/DEEP WOUND)

## 2021-02-14 NOTE — Progress Notes (Signed)
Subjective:  Patient ID: Christopher Escobar, male    DOB: 03/30/1956,  MRN: [id number]  Chief Complaint  Patient presents with   Routine Post Op    DOS 11.11.22    DOS: 02/08/2021 Procedure: Left partial fifth ray amputation primarily closed  65 y.o. male returns for post-op check.  Patient presents with a follow-up to left partial fifth ray amputation primarily closed.  Patient was admitted to the hospital where he underwent resection due to osteomyelitis.  He states he is doing better no nausea fever chills vomiting.  He has been controlling his sugar as well.  Culture showed strep agalactiae for which she is already on antibiotics.  Review of Systems: Negative except as noted in the HPI. Denies N/V/F/Ch.  Past Medical History:  Diagnosis Date   Diabetes mellitus without complication ([place]) 9735   Hypertension 2008   Personal history of colonic polyps    TA polyp   Sleep apnea    not using cpap    Current Outpatient Medications:    acetaminophen (TYLENOL) 325 MG tablet, Take 2 tablets (650 mg total) by mouth every 6 (six) hours as needed for mild pain (or Fever >/= 101)., Disp: , Rfl:    amoxicillin-clavulanate (AUGMENTIN) 875-125 MG tablet, Take 1 tablet by mouth 2 (two) times daily for 14 days., Disp: 28 tablet, Rfl: 0   atorvastatin (LIPITOR) 40 MG tablet, Take 40 mg by mouth daily at 6 PM. , Disp: , Rfl:    collagenase (SANTYL) ointment, Apply 1 application topically daily., Disp: 30 g, Rfl: 1   insulin detemir (LEVEMIR) 100 UNIT/ML injection, Inject 60 Units into the skin at bedtime., Disp: , Rfl:    JARDIANCE 25 MG TABS tablet, Take 25 mg by mouth daily., Disp: , Rfl:    metoprolol succinate (TOPROL-XL) 25 MG 24 hr tablet, Take 25 mg every morning by mouth. , Disp: , Rfl:    multivitamin-lutein (OCUVITE-LUTEIN) CAPS capsule, Take 1 capsule by mouth 2 (two) times daily., Disp: , Rfl:    mupirocin ointment (BACTROBAN) 2 %, Apply 1 application topically 2 (two) times daily.,  Disp: 22 g, Rfl: 0   NOVOLOG FLEXPEN 100 UNIT/ML FlexPen, Inject 7-10 Units into the skin 2 (two) times daily before a meal., Disp: , Rfl:    polyethylene glycol (MIRALAX) 17 g packet, Take 17 g by mouth daily as needed., Disp: 30 each, Rfl: 0   pregabalin (LYRICA) 75 MG capsule, Take 75 mg by mouth 2 (two) times daily. , Disp: , Rfl:    senna (SENOKOT) 8.6 MG TABS tablet, Take 2 tablets (17.2 mg total) by mouth at bedtime., Disp: 60 tablet, Rfl: 0   tamsulosin (FLOMAX) 0.4 MG CAPS capsule, Take 0.4 mg by mouth daily., Disp: , Rfl:   Social History   Tobacco Use  Smoking Status Former   Packs/day: 1.00   Years: 30.00   Pack years: 30.00   Types: Cigarettes   Quit date: 02/03/2007   Years since quitting: 14.0  Smokeless Tobacco Never    No Known Allergies Objective:  There were no vitals filed for this visit. There is no height or weight on file to calculate BMI. Constitutional Well developed. Well nourished.  Vascular Foot warm and well perfused. Capillary refill normal to all digits.   Neurologic Normal speech. Oriented to person, place, and time. Epicritic sensation to light touch grossly present bilaterally.  Dermatologic Skin healing well without signs of infection. Skin edges well coapted without signs of infection.  No  clinical signs of infection noted.  Orthopedic: Tenderness to palpation noted about the surgical site.   Radiographs: 3 views of skeletally mature adult left foot: Partial fifth ray amputation noted.  No osteomyelitic changes noted. Assessment:   1. Diabetic ulcer of left midfoot associated with type 2 diabetes mellitus, with fat layer exposed ([place])   2. Status post foot surgery    Plan:  Patient was evaluated and treated and all questions answered.  S/p foot surgery left -Progressing as expected post-operatively. -XR: See above -WB Status: Weightbearing as tolerated in cam boot -Sutures: Intact.  No clinical signs of dehiscence noted no complication  noted. -Medications: Continue antibiotics and finish the course. -Culture showed strep agalactiae -Foot redressed.  No follow-ups on file.

## 2021-02-28 ENCOUNTER — Ambulatory Visit: Payer: Medicare HMO | Admitting: Podiatry

## 2021-02-28 ENCOUNTER — Other Ambulatory Visit: Payer: Self-pay

## 2021-02-28 DIAGNOSIS — Z9889 Other specified postprocedural states: Secondary | ICD-10-CM

## 2021-02-28 DIAGNOSIS — E11621 Type 2 diabetes mellitus with foot ulcer: Secondary | ICD-10-CM

## 2021-02-28 DIAGNOSIS — L97422 Non-pressure chronic ulcer of left heel and midfoot with fat layer exposed: Secondary | ICD-10-CM

## 2021-03-05 NOTE — Progress Notes (Signed)
Subjective:  Patient ID: Christopher Escobar, male    DOB: [birth year],  MRN: [id number]  No chief complaint on file.   DOS: 02/08/2021 Procedure: Left partial fifth ray amputation primarily closed  65 y.o. male returns for post-op check.  Patient presents with a follow-up to left partial fifth ray amputation primarily closed.  Patient states is doing well.  No nausea fever chills vomiting.  He cannot tolerate the cam boot and would like to know if he can go to the shoe.  Review of Systems: Negative except as noted in the HPI. Denies N/V/F/Ch.  Past Medical History:  Diagnosis Date   Diabetes mellitus without complication ([place]) 0277   Hypertension 2008   Personal history of colonic polyps    TA polyp   Sleep apnea    not using cpap    Current Outpatient Medications:    acetaminophen (TYLENOL) 325 MG tablet, Take 2 tablets (650 mg total) by mouth every 6 (six) hours as needed for mild pain (or Fever >/= 101)., Disp: , Rfl:    atorvastatin (LIPITOR) 40 MG tablet, Take 40 mg by mouth daily at 6 PM. , Disp: , Rfl:    collagenase (SANTYL) ointment, Apply 1 application topically daily., Disp: 30 g, Rfl: 1   insulin detemir (LEVEMIR) 100 UNIT/ML injection, Inject 60 Units into the skin at bedtime., Disp: , Rfl:    JARDIANCE 25 MG TABS tablet, Take 25 mg by mouth daily., Disp: , Rfl:    metoprolol succinate (TOPROL-XL) 25 MG 24 hr tablet, Take 25 mg every morning by mouth. , Disp: , Rfl:    multivitamin-lutein (OCUVITE-LUTEIN) CAPS capsule, Take 1 capsule by mouth 2 (two) times daily., Disp: , Rfl:    mupirocin ointment (BACTROBAN) 2 %, Apply 1 application topically 2 (two) times daily., Disp: 22 g, Rfl: 0   NOVOLOG FLEXPEN 100 UNIT/ML FlexPen, Inject 7-10 Units into the skin 2 (two) times daily before a meal., Disp: , Rfl:    polyethylene glycol (MIRALAX) 17 g packet, Take 17 g by mouth daily as needed., Disp: 30 each, Rfl: 0   pregabalin (LYRICA) 75 MG capsule, Take 75 mg by mouth 2 (two)  times daily. , Disp: , Rfl:    senna (SENOKOT) 8.6 MG TABS tablet, Take 2 tablets (17.2 mg total) by mouth at bedtime., Disp: 60 tablet, Rfl: 0   tamsulosin (FLOMAX) 0.4 MG CAPS capsule, Take 0.4 mg by mouth daily., Disp: , Rfl:   Social History   Tobacco Use  Smoking Status Former   Packs/day: 1.00   Years: 30.00   Pack years: 30.00   Types: Cigarettes   Quit date: 02/03/2007   Years since quitting: 14.0  Smokeless Tobacco Never    No Known Allergies Objective:  There were no vitals filed for this visit. There is no height or weight on file to calculate BMI. Constitutional Well developed. Well nourished.  Vascular Foot warm and well perfused. Capillary refill normal to all digits.   Neurologic Normal speech. Oriented to person, place, and time. Epicritic sensation to light touch grossly present bilaterally.  Dermatologic Skin healing well without signs of infection. Skin edges well coapted without signs of infection.  No clinical signs of infection noted.  Orthopedic: Tenderness to palpation noted about the surgical site.   Radiographs: 3 views of skeletally mature adult left foot: Partial fifth ray amputation noted.  No osteomyelitic changes noted. Assessment:   1. Diabetic ulcer of left midfoot associated with type 2 diabetes mellitus, with  fat layer exposed ([place])   2. Status post foot surgery     Plan:  Patient was evaluated and treated and all questions answered.  S/p foot surgery left -Progressing as expected post-operatively. -XR: See above -WB Status: Transition to surgical shoe as patient not able to tolerate the cam boot -Sutures: Removed.  No clinical signs of dehiscence noted no complication noted. -Medications: Continue antibiotics and finish the course. -Culture showed strep agalactiae -Foot redressed.  No follow-ups on file.

## 2021-03-21 ENCOUNTER — Other Ambulatory Visit: Payer: Self-pay

## 2021-03-21 ENCOUNTER — Ambulatory Visit: Payer: Medicare HMO | Admitting: Podiatry

## 2021-03-21 DIAGNOSIS — L97422 Non-pressure chronic ulcer of left heel and midfoot with fat layer exposed: Secondary | ICD-10-CM

## 2021-03-21 DIAGNOSIS — Z9889 Other specified postprocedural states: Secondary | ICD-10-CM

## 2021-03-21 DIAGNOSIS — E11621 Type 2 diabetes mellitus with foot ulcer: Secondary | ICD-10-CM

## 2021-03-21 NOTE — Progress Notes (Signed)
Subjective:  Patient ID: Christopher Escobar, male    DOB: 1955/12/31,  MRN: [id number]  Chief Complaint  Patient presents with   Diabetic Ulcer    Left foot      DOS: 02/08/2021 Procedure: Left partial fifth ray amputation primarily closed  65 y.o. male returns for post-op check.  Patient presents with a follow-up to left partial fifth ray amputation primarily closed.  Patient states is doing well.  No nausea fever chills vomiting.  He cannot tolerate the cam boot and would like to know if he can go to the shoe.  Review of Systems: Negative except as noted in the HPI. Denies N/V/F/Ch.  Past Medical History:  Diagnosis Date   Diabetes mellitus without complication ([place]) 2774   Hypertension 2008   Personal history of colonic polyps    TA polyp   Sleep apnea    not using cpap    Current Outpatient Medications:    acetaminophen (TYLENOL) 325 MG tablet, Take 2 tablets (650 mg total) by mouth every 6 (six) hours as needed for mild pain (or Fever >/= 101)., Disp: , Rfl:    atorvastatin (LIPITOR) 40 MG tablet, Take 40 mg by mouth daily at 6 PM. , Disp: , Rfl:    collagenase (SANTYL) ointment, Apply 1 application topically daily., Disp: 30 g, Rfl: 1   insulin detemir (LEVEMIR) 100 UNIT/ML injection, Inject 60 Units into the skin at bedtime., Disp: , Rfl:    JARDIANCE 25 MG TABS tablet, Take 25 mg by mouth daily., Disp: , Rfl:    metoprolol succinate (TOPROL-XL) 25 MG 24 hr tablet, Take 25 mg every morning by mouth. , Disp: , Rfl:    multivitamin-lutein (OCUVITE-LUTEIN) CAPS capsule, Take 1 capsule by mouth 2 (two) times daily., Disp: , Rfl:    mupirocin ointment (BACTROBAN) 2 %, Apply 1 application topically 2 (two) times daily., Disp: 22 g, Rfl: 0   NOVOLOG FLEXPEN 100 UNIT/ML FlexPen, Inject 7-10 Units into the skin 2 (two) times daily before a meal., Disp: , Rfl:    polyethylene glycol (MIRALAX) 17 g packet, Take 17 g by mouth daily as needed., Disp: 30 each, Rfl: 0   pregabalin  (LYRICA) 75 MG capsule, Take 75 mg by mouth 2 (two) times daily. , Disp: , Rfl:    senna (SENOKOT) 8.6 MG TABS tablet, Take 2 tablets (17.2 mg total) by mouth at bedtime., Disp: 60 tablet, Rfl: 0   tamsulosin (FLOMAX) 0.4 MG CAPS capsule, Take 0.4 mg by mouth daily., Disp: , Rfl:   Social History   Tobacco Use  Smoking Status Former   Packs/day: 1.00   Years: 30.00   Pack years: 30.00   Types: Cigarettes   Quit date: 02/03/2007   Years since quitting: 14.1  Smokeless Tobacco Never    No Known Allergies Objective:  There were no vitals filed for this visit. There is no height or weight on file to calculate BMI. Constitutional Well developed. Well nourished.  Vascular Foot warm and well perfused. Capillary refill normal to all digits.   Neurologic Normal speech. Oriented to person, place, and time. Epicritic sensation to light touch grossly present bilaterally.  Dermatologic Skin well very epithelialized.  No further breakdown noted.  No signs of wound noted.  Orthopedic: No further tenderness to palpation noted about the surgical site.   Radiographs: 3 views of skeletally mature adult left foot: Partial fifth ray amputation noted.  No osteomyelitic changes noted. Assessment:   No diagnosis found.  Plan:  Patient was evaluated and treated and all questions answered.  S/p foot surgery left -Clinically healed.  At this time patient can stop any antibiotics.  I have asked him to go back into his diabetic shoes.  I encouraged him to continue monitoring the foot.  The incision and the skin has completely epithelialized.  If any foot and ankle issues arise in future I will asked him to come see me. No follow-ups on file.

## 2021-04-18 ENCOUNTER — Ambulatory Visit: Payer: Medicare HMO | Admitting: Podiatry

## 2021-04-18 ENCOUNTER — Other Ambulatory Visit: Payer: Self-pay

## 2021-04-18 DIAGNOSIS — E11621 Type 2 diabetes mellitus with foot ulcer: Secondary | ICD-10-CM

## 2021-04-18 DIAGNOSIS — L97412 Non-pressure chronic ulcer of right heel and midfoot with fat layer exposed: Secondary | ICD-10-CM | POA: Diagnosis not present

## 2021-04-18 NOTE — Progress Notes (Signed)
Subjective:  Patient ID: Christopher Escobar, male    DOB: June 01, 1955,  MRN: [id number]  Chief Complaint  Patient presents with   Diabetic Ulcer    Right foot     66 y.o. male presents for wound care.  Patient presents with right midfoot wound with fat layer exposed.  Patient states it started with a blister and progressive gotten worse.  He does not recall how he got this.  He wanted get it evaluated.  His sugars have been better controlled.  He wanted to make sure that he does not lose any part of his foot on the side.  The left side is doing good and has healed well.  He denies any other acute complaints.   Review of Systems: Negative except as noted in the HPI. Denies N/V/F/Ch.  Past Medical History:  Diagnosis Date   Diabetes mellitus without complication ([place]) 2094   Hypertension 2008   Personal history of colonic polyps    TA polyp   Sleep apnea    not using cpap    Current Outpatient Medications:    acetaminophen (TYLENOL) 325 MG tablet, Take 2 tablets (650 mg total) by mouth every 6 (six) hours as needed for mild pain (or Fever >/= 101)., Disp: , Rfl:    atorvastatin (LIPITOR) 40 MG tablet, Take 40 mg by mouth daily at 6 PM. , Disp: , Rfl:    collagenase (SANTYL) ointment, Apply 1 application topically daily., Disp: 30 g, Rfl: 1   insulin detemir (LEVEMIR) 100 UNIT/ML injection, Inject 60 Units into the skin at bedtime., Disp: , Rfl:    JARDIANCE 25 MG TABS tablet, Take 25 mg by mouth daily., Disp: , Rfl:    metoprolol succinate (TOPROL-XL) 25 MG 24 hr tablet, Take 25 mg every morning by mouth. , Disp: , Rfl:    multivitamin-lutein (OCUVITE-LUTEIN) CAPS capsule, Take 1 capsule by mouth 2 (two) times daily., Disp: , Rfl:    mupirocin ointment (BACTROBAN) 2 %, Apply 1 application topically 2 (two) times daily., Disp: 22 g, Rfl: 0   NOVOLOG FLEXPEN 100 UNIT/ML FlexPen, Inject 7-10 Units into the skin 2 (two) times daily before a meal., Disp: , Rfl:    polyethylene glycol  (MIRALAX) 17 g packet, Take 17 g by mouth daily as needed., Disp: 30 each, Rfl: 0   pregabalin (LYRICA) 75 MG capsule, Take 75 mg by mouth 2 (two) times daily. , Disp: , Rfl:    senna (SENOKOT) 8.6 MG TABS tablet, Take 2 tablets (17.2 mg total) by mouth at bedtime., Disp: 60 tablet, Rfl: 0   tamsulosin (FLOMAX) 0.4 MG CAPS capsule, Take 0.4 mg by mouth daily., Disp: , Rfl:   Social History   Tobacco Use  Smoking Status Former   Packs/day: 1.00   Years: 30.00   Pack years: 30.00   Types: Cigarettes   Quit date: 02/03/2007   Years since quitting: 14.2  Smokeless Tobacco Never    No Known Allergies Objective:  There were no vitals filed for this visit. There is no height or weight on file to calculate BMI. Constitutional Well developed. Well nourished.  Vascular Dorsalis pedis pulses palpable bilaterally. Posterior tibial pulses palpable bilaterally. Capillary refill normal to all digits.  No cyanosis or clubbing noted. Pedal hair growth normal.  Neurologic Normal speech. Oriented to person, place, and time. Protective sensation absent  Dermatologic Wound Location: Right plantar midfoot with fat layer exposed.  Does not probe down to any deep tissue.  Fiber granular  wound base.  No redness noted. Wound Base: Mixed Granular/Fibrotic Peri-wound: Normal Exudate: Scant/small amount Serosanguinous exudate Wound Measurements: -See below  Orthopedic: No pain to palpation either foot.   Radiographs: None Assessment:   1. Diabetic ulcer of right midfoot associated with type 2 diabetes mellitus, with fat layer exposed ([employer])    Plan:  Patient was evaluated and treated and all questions answered.  Ulcer right plantar midfoot ulceration with fat layer exposed -Debridement as below. -Dressed with Betadine wet-to-dry, DSD. -Continue off-loading with surgical shoe.  Procedure: Excisional Debridement of Wound Tool: Sharp chisel blade/tissue nipper Rationale: Removal of non-viable  soft tissue from the wound to promote healing.  Anesthesia: none Pre-Debridement Wound Measurements: 2.5 cm x 1.5 cm x 0.3 cm  Post-Debridement Wound Measurements: 2.7 cm x 1.7 cm x 0.3 cm  Type of Debridement: Sharp Excisional Tissue Removed: Non-viable soft tissue Blood loss: Minimal (<50cc) Depth of Debridement: subcutaneous tissue. Technique: Sharp excisional debridement to bleeding, viable wound base.  Wound Progress: This is my initial evaluation of continue monitor the progression of the wound. Site healing conversation 7 Dressing: Dry, sterile, compression dressing. Disposition: Patient tolerated procedure well. Patient to return in 1 week for follow-up.  No follow-ups on file.

## 2021-05-16 ENCOUNTER — Ambulatory Visit: Payer: Medicare HMO | Admitting: Podiatry

## 2021-05-21 ENCOUNTER — Other Ambulatory Visit: Payer: Self-pay

## 2021-05-21 ENCOUNTER — Ambulatory Visit: Payer: Medicare HMO | Admitting: Podiatry

## 2021-05-21 DIAGNOSIS — L97412 Non-pressure chronic ulcer of right heel and midfoot with fat layer exposed: Secondary | ICD-10-CM | POA: Diagnosis not present

## 2021-05-21 DIAGNOSIS — E11621 Type 2 diabetes mellitus with foot ulcer: Secondary | ICD-10-CM | POA: Diagnosis not present

## 2021-05-24 ENCOUNTER — Encounter: Payer: Self-pay | Admitting: Podiatry

## 2021-05-24 NOTE — Progress Notes (Signed)
Subjective:  Patient ID: Christopher Escobar, male    DOB: 04-Apr-1955,  MRN: [id number]  Chief Complaint  Patient presents with   Diabetic Ulcer    Right foot ulcer     66 y.o. male presents for wound care.  Patient presents with a follow-up of right midfoot wound with fat layer exposed.  Patient states that he is doing better.  His pain is better.  He has been doing Betadine wet-to-dry dressing changes.  He would like to know if he can go to the wound care center.   Review of Systems: Negative except as noted in the HPI. Denies N/V/F/Ch.  Past Medical History:  Diagnosis Date   Diabetes mellitus without complication ([place]) 4098   Hypertension 2008   Personal history of colonic polyps    TA polyp   Sleep apnea    not using cpap    Current Outpatient Medications:    acetaminophen (TYLENOL) 325 MG tablet, Take 2 tablets (650 mg total) by mouth every 6 (six) hours as needed for mild pain (or Fever >/= 101)., Disp: , Rfl:    atorvastatin (LIPITOR) 40 MG tablet, Take 40 mg by mouth daily at 6 PM. , Disp: , Rfl:    collagenase (SANTYL) ointment, Apply 1 application topically daily., Disp: 30 g, Rfl: 1   insulin detemir (LEVEMIR) 100 UNIT/ML injection, Inject 60 Units into the skin at bedtime., Disp: , Rfl:    JARDIANCE 25 MG TABS tablet, Take 25 mg by mouth daily., Disp: , Rfl:    metoprolol succinate (TOPROL-XL) 25 MG 24 hr tablet, Take 25 mg every morning by mouth. , Disp: , Rfl:    multivitamin-lutein (OCUVITE-LUTEIN) CAPS capsule, Take 1 capsule by mouth 2 (two) times daily., Disp: , Rfl:    mupirocin ointment (BACTROBAN) 2 %, Apply 1 application topically 2 (two) times daily., Disp: 22 g, Rfl: 0   NOVOLOG FLEXPEN 100 UNIT/ML FlexPen, Inject 7-10 Units into the skin 2 (two) times daily before a meal., Disp: , Rfl:    polyethylene glycol (MIRALAX) 17 g packet, Take 17 g by mouth daily as needed., Disp: 30 each, Rfl: 0   pregabalin (LYRICA) 75 MG capsule, Take 75 mg by mouth 2 (two) times  daily. , Disp: , Rfl:    senna (SENOKOT) 8.6 MG TABS tablet, Take 2 tablets (17.2 mg total) by mouth at bedtime., Disp: 60 tablet, Rfl: 0   tamsulosin (FLOMAX) 0.4 MG CAPS capsule, Take 0.4 mg by mouth daily., Disp: , Rfl:   Social History   Tobacco Use  Smoking Status Former   Packs/day: 1.00   Years: 30.00   Pack years: 30.00   Types: Cigarettes   Quit date: 02/03/2007   Years since quitting: 14.3  Smokeless Tobacco Never    No Known Allergies Objective:  There were no vitals filed for this visit. There is no height or weight on file to calculate BMI. Constitutional Well developed. Well nourished.  Vascular Dorsalis pedis pulses palpable bilaterally. Posterior tibial pulses palpable bilaterally. Capillary refill normal to all digits.  No cyanosis or clubbing noted. Pedal hair growth normal.  Neurologic Normal speech. Oriented to person, place, and time. Protective sensation absent  Dermatologic Wound Location: Right plantar midfoot with fat layer exposed.  Does not probe down to any deep tissue.  Fiber granular wound base.  No redness noted. Wound Base: Mixed Granular/Fibrotic Peri-wound: Normal Exudate: Scant/small amount Serosanguinous exudate Wound Measurements: -See below  Orthopedic: No pain to palpation either foot.  Radiographs: None Assessment:   1. Diabetic ulcer of right midfoot associated with type 2 diabetes mellitus, with fat layer exposed ([place])    Plan:  Patient was evaluated and treated and all questions answered.  Ulcer right plantar midfoot ulceration with fat layer exposed -Debridement as below. -Dressed with Betadine wet-to-dry, DSD. -Continue off-loading with surgical shoe.  Procedure: Excisional Debridement of Wound~stagnant Tool: Sharp chisel blade/tissue nipper Rationale: Removal of non-viable soft tissue from the wound to promote healing.  Anesthesia: none Pre-Debridement Wound Measurements: 2.5 cm x 1.5 cm x 0.3 cm  Post-Debridement  Wound Measurements: 2.7 cm x 1.7 cm x 0.3 cm  Type of Debridement: Sharp Excisional Tissue Removed: Non-viable soft tissue Blood loss: Minimal (<50cc) Depth of Debridement: subcutaneous tissue. Technique: Sharp excisional debridement to bleeding, viable wound base.  Wound Progress: The wound is staying about the same. Dressing: Dry, sterile, compression dressing. Disposition: Patient tolerated procedure well. Patient to return in 1 week for follow-up.  No follow-ups on file.

## 2021-06-13 ENCOUNTER — Ambulatory Visit
Admission: RE | Admit: 2021-06-13 | Discharge: 2021-06-13 | Disposition: A | Discharge status: Home / Self Care | Payer: Medicare HMO | Attending: Internal Medicine | Admitting: Internal Medicine

## 2021-06-13 ENCOUNTER — Other Ambulatory Visit: Payer: Self-pay

## 2021-06-13 ENCOUNTER — Encounter (HOSPITAL_BASED_OUTPATIENT_CLINIC_OR_DEPARTMENT_OTHER): Payer: Medicare HMO | Attending: Internal Medicine | Admitting: Internal Medicine

## 2021-06-13 ENCOUNTER — Ambulatory Visit
Admission: RE | Admit: 2021-06-13 | Discharge: 2021-06-13 | Disposition: A | Discharge status: Home / Self Care | Payer: Medicare HMO | Source: Ambulatory Visit | Attending: *Deleted | Admitting: *Deleted

## 2021-06-13 DIAGNOSIS — E11621 Type 2 diabetes mellitus with foot ulcer: Secondary | ICD-10-CM | POA: Insufficient documentation

## 2021-06-13 DIAGNOSIS — L97512 Non-pressure chronic ulcer of other part of right foot with fat layer exposed: Secondary | ICD-10-CM | POA: Diagnosis present

## 2021-06-13 DIAGNOSIS — Z833 Family history of diabetes mellitus: Secondary | ICD-10-CM | POA: Insufficient documentation

## 2021-06-13 DIAGNOSIS — E1142 Type 2 diabetes mellitus with diabetic polyneuropathy: Secondary | ICD-10-CM | POA: Insufficient documentation

## 2021-06-13 DIAGNOSIS — Z794 Long term (current) use of insulin: Secondary | ICD-10-CM | POA: Diagnosis not present

## 2021-06-13 DIAGNOSIS — R2689 Other abnormalities of gait and mobility: Secondary | ICD-10-CM | POA: Diagnosis not present

## 2021-06-13 IMAGING — CR DG FOOT COMPLETE 3+V*R*
1 series · 3 of 3 positions shown · non-contrast
Comparison: X-ray [DATE].

CLINICAL DATA: Right foot ulcer x 3 months.

EXAM:
RIGHT FOOT COMPLETE - 3+ VIEW

[Series 1: dg foot complete right · 0.14mm/px · 3 of 3 slices shown]
[im 1/3]
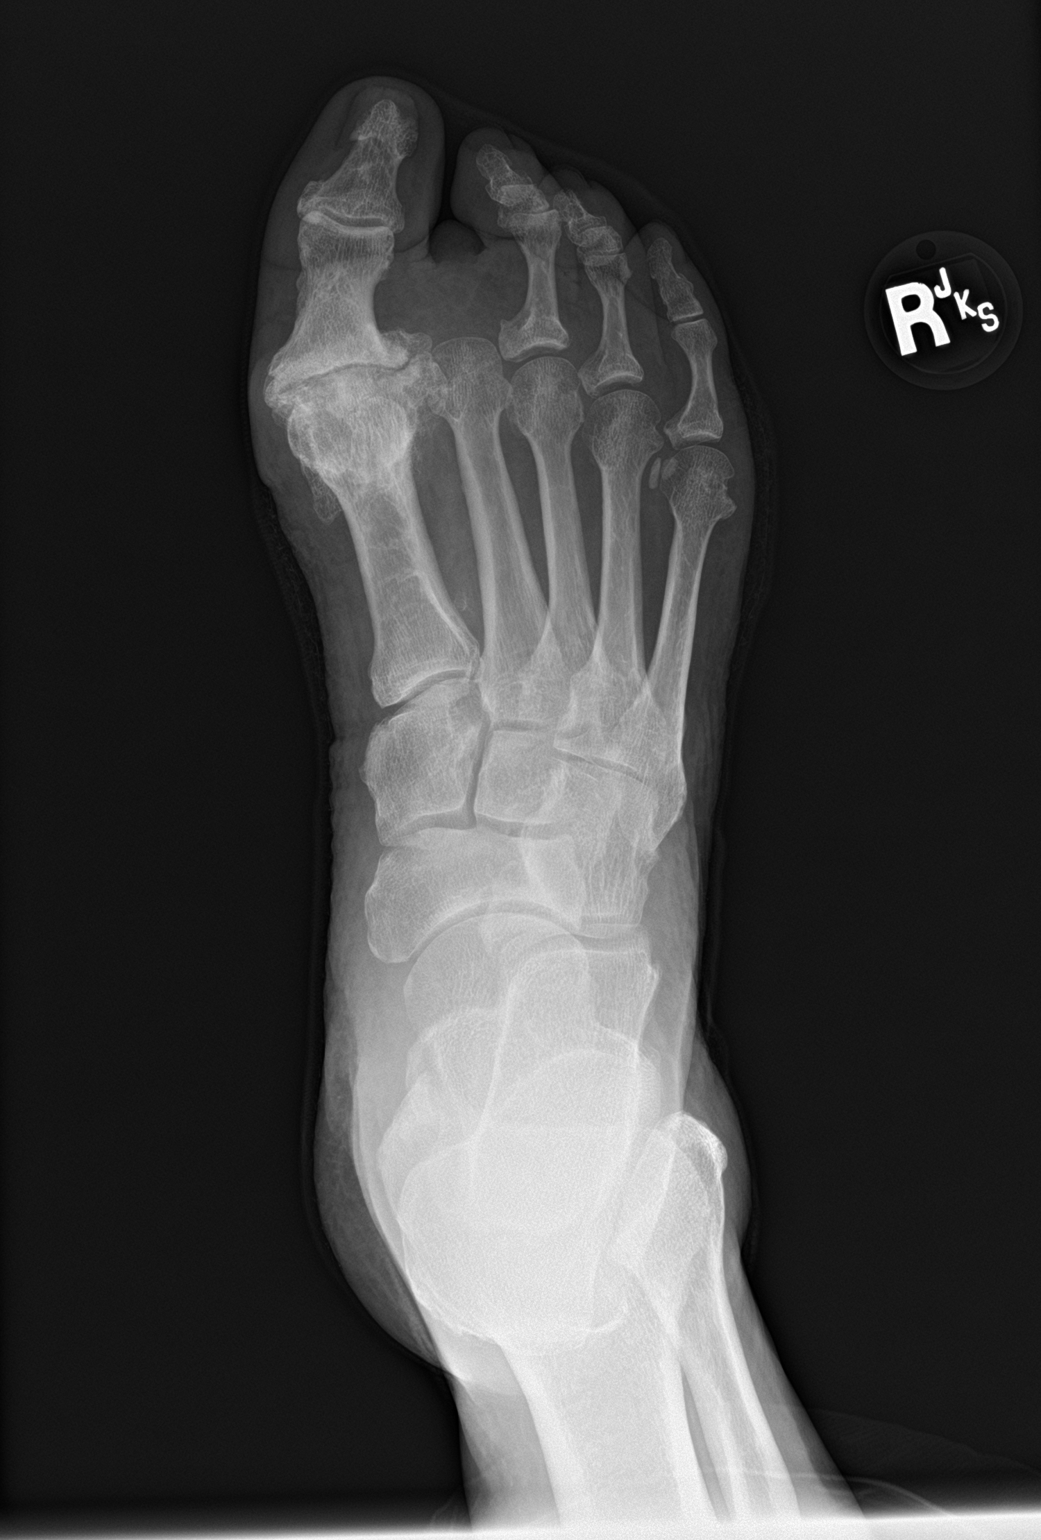
[im 2/3]
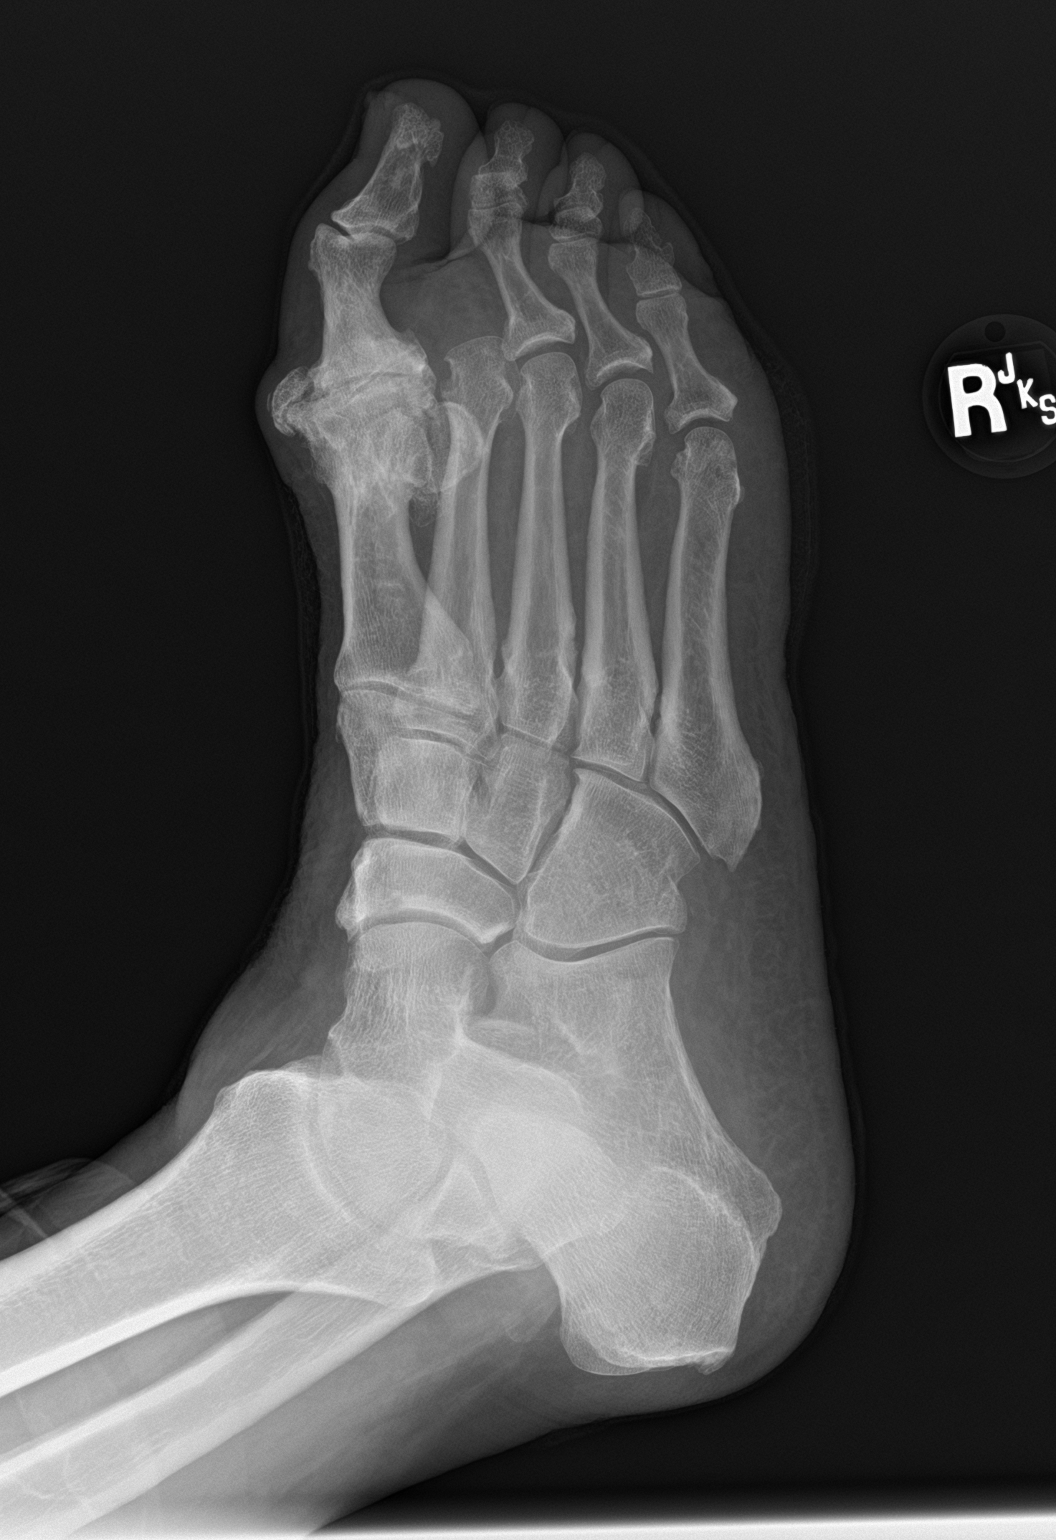
[im 3/3]
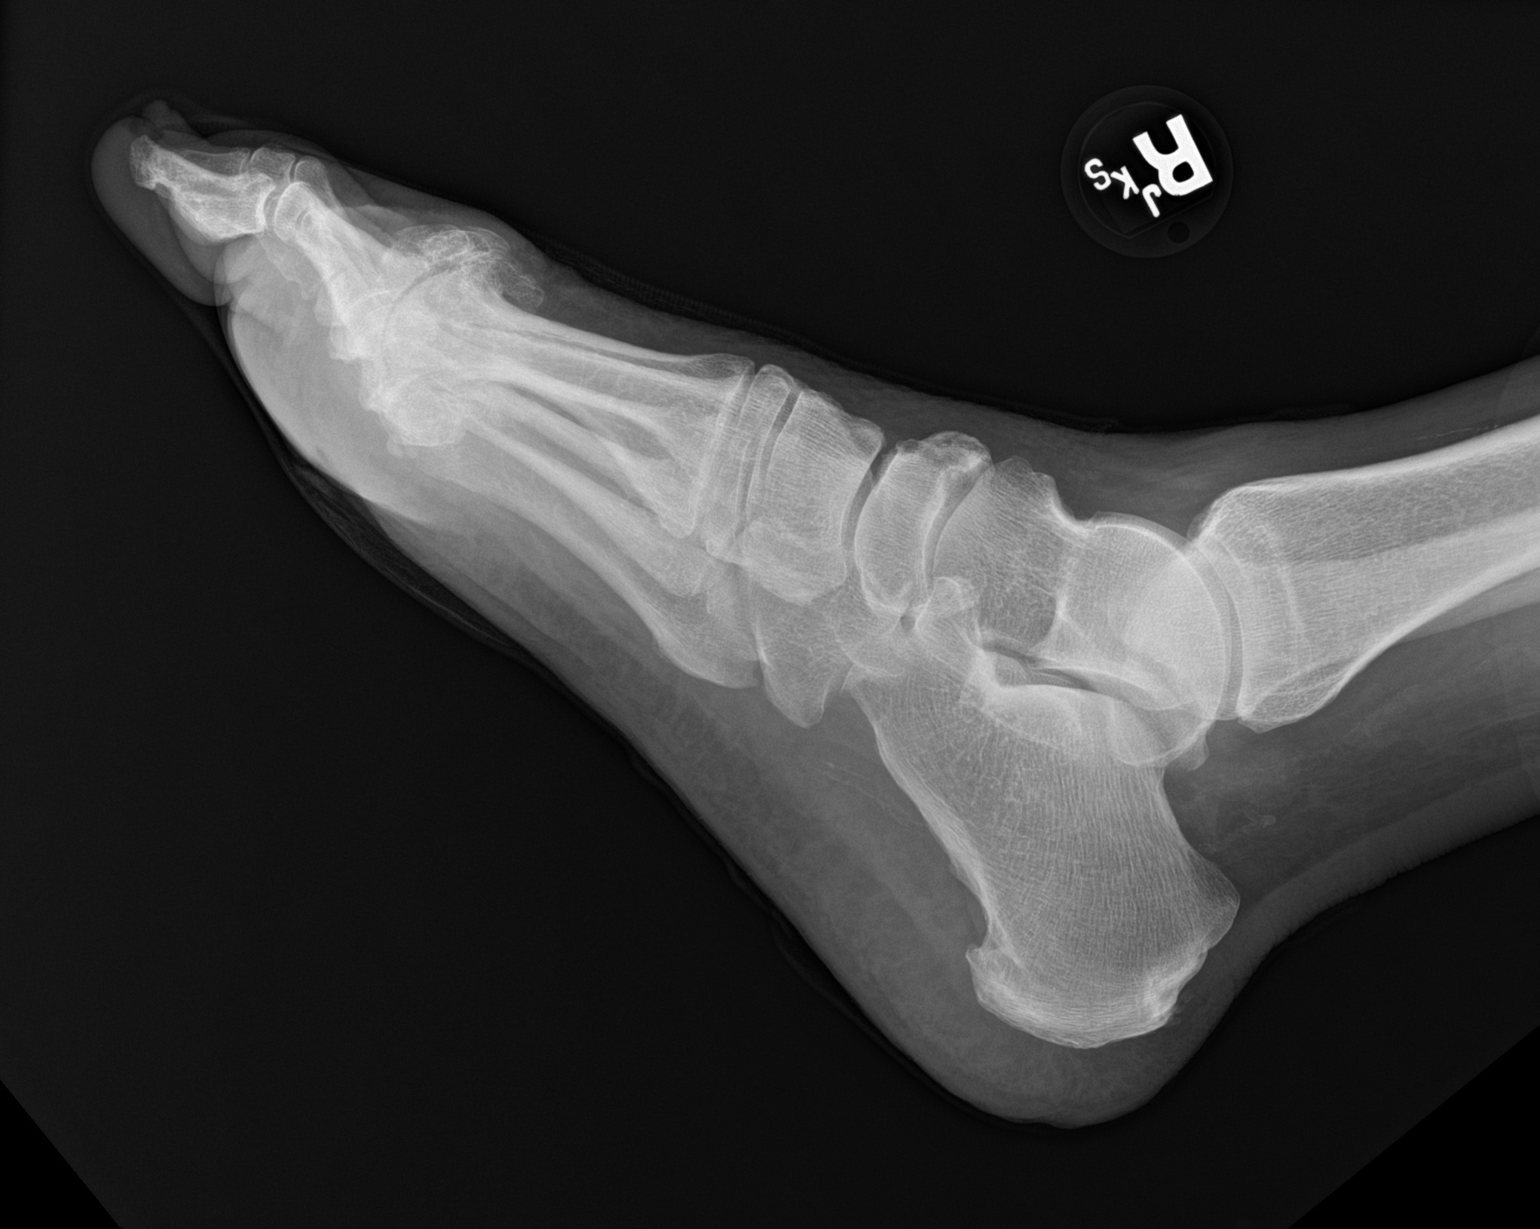

[3 of 3 positions shown; findings below may reference images not displayed]

FINDINGS: Severe degenerative changes of the first metatarsophalangeal joint.
Postsurgical changes of second toe amputation again seen. Erosions
in the lateral aspect of the fifth metatarsal head and medial aspect
of the second metatarsal head are suspicious for osteomyelitis.
These are new since the prior exam from [DATE].
IMPRESSION: Erosions in the medial second metatarsal head and lateral fifth
metatarsal head are new since prior examination and are suspicious
for osteomyelitis.

## 2021-06-13 NOTE — Progress Notes (Signed)
LEVOY, GEISEN. ([id number]) ?Visit Report for 06/13/2021 ?Allergy List Details ?Patient Name: Date of Service: ?Quarry, DO NA LD M. 06/13/2021 1:15 PM ?Medical Record Number: [id number] ?Patient Account Number: [id number] ?Date of Birth/Sex: Treating RN: ?12/31/1955 (66 y.o. Christopher Escobar ?Primary Care Yolani Vo: Benita Stabile Other Clinician: ?Referring Devany Aja: ?Treating Levora Werden/Extender: Kalman Shan ?Benita Stabile ?Weeks in Treatment: 0 ?Allergies ?Active Allergies ?No Known Allergies ?Allergy Notes ?Electronic Signature(s) ?Signed: 06/13/2021 6:23:55 PM By: Lorrin Jackson ?Entered By: Lorrin Jackson on 06/13/2021 13:58:56 ?-------------------------------------------------------------------------------- ?Arrival Information Details ?Patient Name: Date of Service: ?Gilland, DO NA LD M. 06/13/2021 1:15 PM ?Medical Record Number: [id number] ?Patient Account Number: [id number] ?Date of Birth/Sex: Treating RN: ?1956/03/25 (66 y.o. Christopher Escobar ?Primary Care Annamary Buschman: Benita Stabile Other Clinician: ?Referring Allycia Pitz: ?Treating Asley Baskerville/Extender: Kalman Shan ?Benita Stabile ?Weeks in Treatment: 0 ?Visit Information ?Patient Arrived: Ambulatory ?Arrival Time: 13:57 ?Transfer Assistance: None ?Patient Identification Verified: Yes ?Secondary Verification Process Completed: Yes ?Patient Requires Transmission-Based Precautions: No ?Patient Has Alerts: No ?Electronic Signature(s) ?Signed: 06/13/2021 6:23:55 PM By: Lorrin Jackson ?Entered By: Lorrin Jackson on 06/13/2021 13:57:44 ?-------------------------------------------------------------------------------- ?Clinic Level of Care Assessment Details ?Patient Name: Date of Service: ?Schuchart, DO NA LD M. 06/13/2021 1:15 PM ?Medical Record Number: [id number] ?Patient Account Number: [id number] ?Date of Birth/Sex: Treating RN: ?May 29, 1955 (66 y.o. Christopher Escobar ?Primary Care Dorethea Strubel: Other Clinician: ?Benita Stabile ?Referring Ghadeer Kastelic: ?Treating Timo Hartwig/Extender: Kalman Shan ?Benita Stabile ?Weeks in Treatment: 0 ?Clinic Level of Care Assessment Items ?TOOL 1 Quantity Score ?X- 1 0 ?Use when EandM and Procedure is performed on INITIAL visit ?ASSESSMENTS - Nursing Assessment / Reassessment ?X- 1 20 ?General Physical Exam (combine w/ comprehensive assessment (listed just below) when performed on new pt. evals) ?X- 1 25 ?Comprehensive Assessment (HX, ROS, Risk Assessments, Wounds Hx, etc.) ?ASSESSMENTS - Wound and Skin Assessment / Reassessment ?'[]'$  - 0 ?Dermatologic / Skin Assessment (not related to wound area) ?ASSESSMENTS - Ostomy and/or Continence Assessment and Care ?'[]'$  - 0 ?Incontinence Assessment and Management ?'[]'$  - 0 ?Ostomy Care Assessment and Management (repouching, etc.) ?PROCESS - Coordination of Care ?'[]'$  - 0 ?Simple Patient / Family Education for ongoing care ?X- 1 20 ?Complex (extensive) Patient / Family Education for ongoing care ?X- 1 10 ?Staff obtains Consents, Records, T Results / Process Orders ?est ?X- 1 10 ?Staff telephones [place], Nursing Homes / Clarify orders / etc ?'[]'$  - 0 ?Routine Transfer to another Facility (non-emergent condition) ?'[]'$  - 0 ?Routine Hospital Admission (non-emergent condition) ?'[]'$  - 0 ?New Admissions / Biomedical engineer / Ordering NPWT Apligraf, etc. ?, ?'[]'$  - 0 ?Emergency Hospital Admission (emergent condition) ?PROCESS - Special Needs ?'[]'$  - 0 ?Pediatric / Minor Patient Management ?'[]'$  - 0 ?Isolation Patient Management ?'[]'$  - 0 ?Hearing / Language / Visual special needs ?'[]'$  - 0 ?Assessment of Community assistance (transportation, D/C planning, etc.) ?'[]'$  - 0 ?Additional assistance / Altered mentation ?'[]'$  - 0 ?Support Surface(s) Assessment (bed, cushion, seat, etc.) ?INTERVENTIONS - Miscellaneous ?'[]'$  - 0 ?External ear exam ?'[]'$  - 0 ?Patient Transfer (multiple staff / Civil Service fast streamer / Similar devices) ?'[]'$  - 0 ?Simple Staple / Suture removal (25 or less) ?'[]'$  - 0 ?Complex Staple / Suture removal (26 or more) ?'[]'$  - 0 ?Hypo/Hyperglycemic Management (do  not check if billed separately) ?X- 1 15 ?Ankle / Brachial Index (ABI) - do not check if billed separately ?Has the patient been seen at the hospital within the last three years: Yes ?Total Score: 100 ?Level Of Care: New/Established - Level 3 ?Electronic Signature(s) ?Signed: 06/13/2021  6:23:55 PM By: Lorrin Jackson ?Entered By: Lorrin Jackson on 06/13/2021 14:55:23 ?-------------------------------------------------------------------------------- ?Encounter Discharge Information Details ?Patient Name: ?Date of Service: ?Verdell, DO NA LD M. 06/13/2021 1:15 PM ?Medical Record Number: [id number] ?Patient Account Number: [id number] ?Date of Birth/Sex: ?Treating RN: ?01/18/1956 (66 y.o. Christopher Escobar ?Primary Care Xavier Fournier: Benita Stabile ?Other Clinician: ?Referring Veleda Mun: ?Treating Mekhai Venuto/Extender: Kalman Shan ?Benita Stabile ?Weeks in Treatment: 0 ?Encounter Discharge Information Items Post Procedure Vitals ?Discharge Condition: Stable ?Temperature (F): 97.8 ?Ambulatory Status: Ambulatory ?Pulse (bpm): 80 ?Discharge Destination: Home ?Respiratory Rate (breaths/min): 16 ?Transportation: Private Auto ?Blood Pressure (mmHg): 119/80 ?Schedule Follow-up Appointment: Yes ?Clinical Summary of Care: Provided on 06/13/2021 ?Form Type Recipient ?Paper Patient Patient ?Electronic Signature(s) ?Signed: 06/13/2021 5:24:39 PM By: Lorrin Jackson ?Entered By: Lorrin Jackson on 06/13/2021 17:24:39 ?-------------------------------------------------------------------------------- ?Lower Extremity Assessment Details ?Patient Name: ?Date of Service: ?Zegers, DO NA LD M. 06/13/2021 1:15 PM ?Medical Record Number: [id number] ?Patient Account Number: [id number] ?Date of Birth/Sex: ?Treating RN: ?May 30, 1955 (66 y.o. Christopher Escobar ?Primary Care Zayley Arras: Benita Stabile ?Other Clinician: ?Referring Armando Lauman: ?Treating Griselle Rufer/Extender: Kalman Shan ?Benita Stabile ?Weeks in Treatment: 0 ?Edema Assessment ?Assessed: [Left: No] [Right:  Yes] ?E[Left: dema] [Right: :] ?Calf ?Left: Right: ?Point of Measurement: 33 cm From Medial Instep 39 cm ?Ankle ?Left: Right: ?Point of Measurement: 12 cm From Medial Instep 21.4 cm ?Knee To Floor ?Left: Right: ?From Medial Instep 45 cm ?Vascular Assessment ?Pulses: ?Dorsalis Pedis ?Palpable: [Right:Yes] ?Blood Pressure: ?Brachial: [Right:119] ?Ankle: ?[Right:Dorsalis Pedis: 160 1.34] ?Electronic Signature(s) ?Signed: 06/13/2021 6:23:55 PM By: Lorrin Jackson ?Entered By: Lorrin Jackson on 06/13/2021 14:14:40 ?-------------------------------------------------------------------------------- ?Multi Wound Chart Details ?Patient Name: ?Date of Service: ?Mclelland, DO NA LD M. 06/13/2021 1:15 PM ?Medical Record Number: [id number] ?Patient Account Number: [id number] ?Date of Birth/Sex: ?Treating RN: ?31-Jan-1956 (66 y.o. M) ?Primary Care Shloima Clinch: Benita Stabile ?Other Clinician: ?Referring Nicollette Wilhelmi: ?Treating Leisha Trinkle/Extender: Kalman Shan ?Benita Stabile ?Weeks in Treatment: 0 ?Vital Signs ?Height(in): 71 ?Pulse(bpm): 80 ?Weight(lbs): 248 ?Blood Pressure(mmHg): 119/80 ?Body Mass Index(BMI): 34.6 ?Temperature(??F): 97.8 ?Respiratory Rate(breaths/min): 16 ?Photos: [N/A:N/A] ?Right, Plantar Foot N/A N/A ?Wound Location: ?Blister N/A N/A ?Wounding Event: ?Diabetic Wound/Ulcer of the Lower N/A N/A ?Primary Etiology: ?Extremity ?Lymphedema, Hypertension, Type II N/A N/A ?Comorbid History: ?Diabetes, Osteomyelitis, Neuropathy, ?Received Radiation ?03/18/2021 N/A N/A ?Date Acquired: ?0 N/A N/A ?Weeks of Treatment: ?Open N/A N/A ?Wound Status: ?No N/A N/A ?Wound Recurrence: ?1x1.2x0.7 N/A N/A ?Measurements L x W x D (cm) ?0.942 N/A N/A ?A (cm?) : ?rea ?0.66 N/A N/A ?Volume (cm?) : ?0.00% N/A N/A ?% Reduction in A rea: ?0.00% N/A N/A ?% Reduction in Volume: ?6 ?Starting Position 1 (o'clock): ?2 ?Ending Position 1 (o'clock): ?0.7 ?Maximum Distance 1 (cm): ?Yes N/A N/A ?Undermining: ?Grade 1 N/A N/A ?Classification: ?Medium N/A N/A ?Exudate  A mount: ?Serosanguineous N/A N/A ?Exudate Type: ?red, brown N/A N/A ?Exudate Color: ?Well defined, not attached N/A N/A ?Wound Margin: ?Large (67-100%) N/A N/A ?Granulation A mount: ?Red N/A N/A ?Granulation

## 2021-06-13 NOTE — Progress Notes (Signed)
LAJUAN, KOVALESKI. ([id number]) ?Visit Report for 06/13/2021 ?Chief Complaint Document Details ?Patient Name: Date of Service: ?Weatherly, DO NA LD M. 06/13/2021 1:15 PM ?Medical Record Number: [id number] ?Patient Account Number: [id number] ?Date of Birth/Sex: Treating RN: ?Nov 07, 1955 (66 y.o. M) ?Primary Care Provider: Benita Stabile Other Clinician: ?Referring Provider: ?Treating Provider/Extender: Kalman Shan ?Benita Stabile ?Weeks in Treatment: 0 ?Information Obtained from: Patient ?Chief Complaint ?Right plantar diabetic foot wound ?Electronic Signature(s) ?Signed: 06/13/2021 3:29:35 PM By: Kalman Shan DO ?Entered By: Kalman Shan on 06/13/2021 15:21:31 ?-------------------------------------------------------------------------------- ?Debridement Details ?Patient Name: Date of Service: ?Gambrill, DO NA LD M. 06/13/2021 1:15 PM ?Medical Record Number: [id number] ?Patient Account Number: [id number] ?Date of Birth/Sex: Treating RN: ?Aug 16, 1955 (66 y.o. Marcheta Grammes ?Primary Care Provider: Benita Stabile Other Clinician: ?Referring Provider: ?Treating Provider/Extender: Kalman Shan ?Benita Stabile ?Weeks in Treatment: 0 ?Debridement Performed for Assessment: Wound #1 Right,Plantar Foot ?Performed By: Physician Kalman Shan, DO ?Debridement Type: Chemical/Enzymatic/Mechanical ?Agent Used: gauze and wound cleanser ?Severity of Tissue Pre Debridement: Fat layer exposed ?Level of Consciousness (Pre-procedure): Awake and Alert ?Pre-procedure Verification/Time Out Yes - 14:40 ?Taken: ?Start Time: 14:41 ?Bleeding: Minimum ?Hemostasis Achieved: Pressure ?End Time: 14:46 ?Response to Treatment: Procedure was tolerated well ?Level of Consciousness (Post- Awake and Alert ?procedure): ?Post Debridement Measurements of Total Wound ?Length: (cm) 1 ?Width: (cm) 1.2 ?Depth: (cm) 0.7 ?Volume: (cm?) 0.66 ?Character of Wound/Ulcer Post Debridement: Stable ?Severity of Tissue Post Debridement: Fat layer exposed ?Post Procedure  Diagnosis ?Same as Pre-procedure ?Electronic Signature(s) ?Signed: 06/13/2021 3:29:35 PM By: Kalman Shan DO ?Signed: 06/13/2021 6:23:55 PM By: Lorrin Jackson ?Entered By: Lorrin Jackson on 06/13/2021 14:48:32 ?-------------------------------------------------------------------------------- ?HPI Details ?Patient Name: Date of Service: ?Soo, DO NA LD M. 06/13/2021 1:15 PM ?Medical Record Number: [id number] ?Patient Account Number: [id number] ?Date of Birth/Sex: Treating RN: ?25-Jun-1955 (66 y.o. M) ?Primary Care Provider: Benita Stabile Other Clinician: ?Referring Provider: ?Treating Provider/Extender: Kalman Shan ?Benita Stabile ?Weeks in Treatment: 0 ?History of Present Illness ?HPI Description: Admission 06/13/2021 ?Mr. Keefe Zawistowski is a 66 year old male with a past medical history of insulin-dependent type 2 diabetes with current self-reported hemoglobin A1c of 6.8, ?and peripheral neuropathy that presents to the clinic for a 59-monthhistory of nonhealing ulcer to the right plantar foot. He follows with Dr. PPosey Pronto podiatry for ?this issue. He has been using Betadine wet-to-dry dressings. He is not currently using any offloading device. He denies systemic signs of infection. ?Electronic Signature(s) ?Signed: 06/13/2021 3:29:35 PM By: HKalman ShanDO ?Entered By: HKalman Shanon 06/13/2021 15:23:34 ?-------------------------------------------------------------------------------- ?Physical Exam Details ?Patient Name: Date of Service: ?Nolet, DO NA LD M. 06/13/2021 1:15 PM ?Medical Record Number: [id number]?Patient Account Number: [id number]?Date of Birth/Sex: Treating RN: ?5Jan 14, 1957((66y.o. M) ?Primary Care Provider: TBenita StabileOther Clinician: ?Referring Provider: ?Treating Provider/Extender: HKalman Shan?TBenita Stabile?Weeks in Treatment: 0 ?Constitutional ?respirations regular, non-labored and within target range for patient..Marland Kitchen?Cardiovascular ?2+ dorsalis pedis/posterior tibialis  pulses. ?Psychiatric ?pleasant and cooperative. ?Notes ?Right foot: T the plantar foot there is an open wound with a pale granulation bed with circumferential undermining and circumferential maceration. ?o ?Electronic Signature(s) ?Signed: 06/13/2021 3:29:35 PM By: HKalman ShanDO ?Entered By: HKalman Shanon 06/13/2021 15:24:30 ?-------------------------------------------------------------------------------- ?Physician Orders Details ?Patient Name: ?Date of Service: ?Critzer, DO NA LD M. 06/13/2021 1:15 PM ?Medical Record Number: [id number]?Patient Account Number: [id number]?Date of Birth/Sex: ?Treating RN: ?503-05-1955((66y.o. MMarcheta Grammes?Primary Care Provider: TBenita Stabile?Other Clinician: ?Referring Provider: ?Treating Provider/Extender: HKalman Shan?TBenita Stabile?Weeks in Treatment: 0 ?Verbal /  Phone Orders: No ?Diagnosis Coding ?Follow-up Appointments ?ppointment in 1 week. - with Dr. Heber [place] Leveda Anna, Room 7) ?Return A ?Bathing/ Shower/ Hygiene ?May shower and wash wound with soap and water. - with antibacterial soap with dressing changes ?Edema Control - Lymphedema / SCD / Other ?Avoid standing for long periods of time. ?Off-Loading ?Wedge shoe to: - Animal nutritionist until [employer] obtained. ?Other: - Will order Defender Boot ?Additional Orders / Instructions ?Follow Nutritious Diet - Monitor/Control Blood Sugars ?Wound Treatment ?Wound #1 - Foot Wound Laterality: Plantar, Right ?Cleanser: Soap and Water 1 x Per Day/30 Days ?Discharge Instructions: May shower and wash wound with dial antibacterial soap and water prior to dressing change. ?Prim Dressing: Hydrofera Blue Ready Foam, 2.5 x2.5 in (DME) (Generic) 1 x Per Day/30 Days ?ary ?Discharge Instructions: Apply to wound bed as instructed ?Secondary Dressing: Woven Gauze Sponges 2x2 in (DME) (Generic) 1 x Per Day/30 Days ?Discharge Instructions: Apply over primary dressing as directed. ?Secured With: Child psychotherapist,  Sterile 2x75 (in/in) (DME) (Generic) 1 x Per Day/30 Days ?Discharge Instructions: Secure with stretch gauze as directed. ?Secured With: 18M Medipore H Soft Cloth Surgical T ape, 4 x 10 (in/yd) (DME) (Generic) 1 x Per Day/30 Days ?Discharge Instructions: Secure with tape as directed. ?Radiology ?X-ray, foot: Right Foot - R/O Osteomyelitis ?Services and Therapies ?nkle Brachial Index (ABI) w/ TBI's Bilateral - Chronic Foot Wound, Diabetic ?A ?Electronic Signature(s) ?Signed: 06/13/2021 3:29:35 PM By: Kalman Shan DO ?Entered By: Kalman Shan on 06/13/2021 15:24:47 ?Prescription 06/13/2021 ?-------------------------------------------------------------------------------- ?Jeronimo Greaves. Kalman Shan DO ?Patient Name: ?Provider: ?1955-05-07 [id number] ?Date of Birth: ?NPI#: ?Jerilynn Mages [id number] ?Sex: ?DEA #: ?[phone] ?Phone #: ?License #: ?[place] ?Patient Address: ?[address], [place] [address] [address] ?[place], [place] [address] ?[phone] ?Allergies ?No Known Allergies ?Provider's Orders ?nkle Brachial Index (ABI) w/ TBI's Bilateral - Chronic Foot Wound, Diabetic ?A ?Hand Signature: ?Date(s): ?Prescription 06/13/2021 ?Hanrahan, Cornelio M. Kalman Shan DO ?Patient Name: ?Provider: ?1955-04-20 [id number] ?Date of Birth: ?NPI#: ?Jerilynn Mages [id number] ?Sex: ?DEA #: ?[phone] ?Phone #: ?License #: ?[place] ?Patient Address: ?[address], [place] [address] [address] ?[place],  [address] ?[phone] ?Allergies ?No Known Allergies ?Provider's Orders ?X-ray, foot: Right Foot - R/O Osteomyelitis ?Hand Signature: ?Date(s): ?Electronic Signature(s) ?Signed: 06/13/2021 3:29:35 PM By: Kalman Shan DO ?Entered By: Kalman Shan on 06/13/2021 15:24:48 ?-------------------------------------------------------------------------------- ?Problem List Details ?Patient  Name: ?Date of Service: ?Mcglamery, DO NA LD M. 06/13/2021 1:15 PM ?Medical Record Number: [id number] ?Patient Account Number: [id number] ?Date of Birth/Sex: ?Treating RN: ?[birth year] (66 y.o. M) ?Primary Care Provider: Benita Stabile ?Ot

## 2021-06-13 NOTE — Progress Notes (Signed)
DARYAN, BUELL. ([id number]) ?Visit Report for 06/13/2021 ?Abuse Risk Screen Details ?Patient Name: Date of Service: ?Newbold, DO NA LD M. 06/13/2021 1:15 PM ?Medical Record Number: [id number] ?Patient Account Number: [id number] ?Date of Birth/Sex: Treating RN: ?1956-03-08 (66 y.o. Christopher Escobar ?Primary Care Napolean Sia: Benita Stabile Other Clinician: ?Referring Gearldean Lomanto: ?Treating Iriel Nason/Extender: Kalman Shan ?Benita Stabile ?Weeks in Treatment: 0 ?Abuse Risk Screen Items ?Answer ?ABUSE RISK SCREEN: ?Has anyone close to you tried to hurt or harm you recentlyo No ?Do you feel uncomfortable with anyone in your familyo No ?Has anyone forced you do things that you didnt want to doo No ?Electronic Signature(s) ?Signed: 06/13/2021 6:23:55 PM By: Lorrin Jackson ?Entered By: Lorrin Jackson on 06/13/2021 14:05:33 ?-------------------------------------------------------------------------------- ?Activities of Daily Living Details ?Patient Name: Date of Service: ?Harwood, DO NA LD M. 06/13/2021 1:15 PM ?Medical Record Number: [id number] ?Patient Account Number: [id number] ?Date of Birth/Sex: Treating RN: ?Jun 30, 1955 (66 y.o. Christopher Escobar ?Primary Care Khrystal Jeanmarie: Benita Stabile Other Clinician: ?Referring Evander Macaraeg: ?Treating Alyjah Lovingood/Extender: Kalman Shan ?Benita Stabile ?Weeks in Treatment: 0 ?Activities of Daily Living Items ?Answer ?Activities of Daily Living (Please select one for each item) ?[place] ?T Medications ?ake Completely Able ?Use T elephone Completely Able ?Care for Appearance Completely Able ?Use T oilet Completely Able ?Bath / Shower Completely Able ?Dress Self Completely Able ?Feed Self Completely Able ?Walk Completely Able ?Get In / Out Bed Completely Able ?Housework Completely Able ?Prepare Meals Completely Able ?Handle Money Completely Able ?Shop for Self Completely Able ?Electronic Signature(s) ?Signed: 06/13/2021 6:23:55 PM By: Lorrin Jackson ?Entered By: Lorrin Jackson on  06/13/2021 14:05:57 ?-------------------------------------------------------------------------------- ?Education Screening Details ?Patient Name: ?Date of Service: ?Mumford, DO NA LD M. 06/13/2021 1:15 PM ?Medical Record Number: [id number] ?Patient Account Number: [id number] ?Date of Birth/Sex: ?Treating RN: ?[birth year] (66 y.o. Christopher Escobar ?Primary Care Tiffinie Caillier: Benita Stabile ?Other Clinician: ?Referring Markela Wee: ?Treating Charbel Los/Extender: Kalman Shan ?Benita Stabile ?Weeks in Treatment: 0 ?Primary Learner Assessed: Patient ?Learning Preferences/Education Level/Primary Language ?Learning Preference: Explanation, Demonstration, Printed Material ?Highest Education Level: High School ?Preferred Language: English ?Cognitive Barrier ?Language Barrier: No ?Translator Needed: No ?Memory Deficit: No ?Emotional Barrier: No ?Cultural/Religious Beliefs Affecting Medical Care: No ?Physical Barrier ?Impaired Vision: No ?Impaired Hearing: No ?Decreased Hand dexterity: No ?Knowledge/Comprehension ?Knowledge Level: High ?Comprehension Level: High ?Ability to understand written instructions: High ?Ability to understand verbal instructions: High ?Motivation ?Anxiety Level: Calm ?Cooperation: Cooperative ?Education Importance: Acknowledges Need ?Interest in Health Problems: Asks Questions ?Perception: Coherent ?Willingness to Engage in Self-Management High ?Activities: ?Readiness to Engage in Self-Management High ?Activities: ?Electronic Signature(s) ?Signed: 06/13/2021 6:23:55 PM By: Lorrin Jackson ?Entered By: Lorrin Jackson on 06/13/2021 14:06:23 ?-------------------------------------------------------------------------------- ?Fall Risk Assessment Details ?Patient Name: ?Date of Service: ?Broxson, DO NA LD M. 06/13/2021 1:15 PM ?Medical Record Number: [id number] ?Patient Account Number: [id number] ?Date of Birth/Sex: ?Treating RN: ?19-Mar-1956 (66 y.o. Christopher Escobar ?Primary Care Anant Agard: Benita Stabile ?Other  Clinician: ?Referring Kriss Perleberg: ?Treating Pheng Prokop/Extender: Kalman Shan ?Benita Stabile ?Weeks in Treatment: 0 ?Fall Risk Assessment Items ?Have you had 2 or more falls in the last 12 monthso 0 No ?Have you had any fall that resulted in injury in the last 12 monthso 0 No ?FALLS RISK SCREEN ?History of falling - immediate or within 3 months 0 No ?Secondary diagnosis (Do you have 2 or more medical diagnoseso) 0 No ?Ambulatory aid ?None/bed rest/wheelchair/nurse 0 Yes ?Crutches/cane/walker 0 No ?Furniture 0 No ?Intravenous therapy Access/Saline/Heparin Lock 0 No ?Gait/Transferring ?Normal/ bed rest/ wheelchair 0 Yes ?Weak (short steps with or without  shuffle, stooped but able to lift head while walking, may seek 0 No ?support from furniture) ?Impaired (short steps with shuffle, may have difficulty arising from chair, head down, impaired 0 No ?balance) ?Mental Status ?Oriented to own ability 0 Yes ?Electronic Signature(s) ?Signed: 06/13/2021 6:23:55 PM By: Lorrin Jackson ?Entered By: Lorrin Jackson on 06/13/2021 14:06:35 ?-------------------------------------------------------------------------------- ?Foot Assessment Details ?Patient Name: ?Date of Service: ?Fentress, DO NA LD M. 06/13/2021 1:15 PM ?Medical Record Number: [id number] ?Patient Account Number: [id number] ?Date of Birth/Sex: ?Treating RN: ?1955-04-16 (66 y.o. Christopher Escobar ?Primary Care Massie Mees: Benita Stabile ?Other Clinician: ?Referring Missy Baksh: ?Treating Ayven Pheasant/Extender: Kalman Shan ?Benita Stabile ?Weeks in Treatment: 0 ?Foot Assessment Items ?Site Locations ?+ = Sensation present, - = Sensation absent, C = Callus, U = Ulcer ?R = Redness, W = Warmth, M = Maceration, PU = Pre-ulcerative lesion ?F = Fissure, S = Swelling, D = Dryness ?Assessment ?Right: Left: ?Other Deformity: No No ?Prior Foot Ulcer: Yes No ?Prior Amputation: Yes No ?Charcot Joint: No No ?Ambulatory Status: Ambulatory Without Help ?Gait: Steady ?Notes ?Right 2nd toe  amputation ?Electronic Signature(s) ?Signed: 06/13/2021 6:23:55 PM By: Lorrin Jackson ?Entered By: Lorrin Jackson on 06/13/2021 14:09:07 ?-------------------------------------------------------------------------------- ?Nutrition Risk Screening Details ?Patient Name: ?Date of Service: ?Laymon, DO NA LD M. 06/13/2021 1:15 PM ?Medical Record Number: [id number] ?Patient Account Number: [id number] ?Date of Birth/Sex: ?Treating RN: ?January 22, 1956 (66 y.o. Christopher Escobar ?Primary Care Trashaun Streight: Benita Stabile ?Other Clinician: ?Referring Liani Caris: ?Treating Tylee Newby/Extender: Kalman Shan ?Benita Stabile ?Weeks in Treatment: 0 ?Height (in): 71 ?Weight (lbs): 248 ?Body Mass Index (BMI): 34.6 ?Nutrition Risk Screening Items ?Score Screening ?NUTRITION RISK SCREEN: ?I have an illness or condition that made me change the kind and/or amount of food I eat 0 No ?I eat fewer than two meals per day 0 No ?I eat few fruits and vegetables, or milk products 0 No ?I have three or more drinks of beer, liquor or wine almost every day 0 No ?I have tooth or mouth problems that make it hard for me to eat 0 No ?I don't always have enough money to buy the food I need 0 No ?I eat alone most of the time 0 No ?I take three or more different prescribed or over-the-counter drugs a day 1 Yes ?Without wanting to, I have lost or gained 10 pounds in the last six months 0 No ?I am not always physically able to shop, cook and/or feed myself 0 No ?Nutrition Protocols ?Good Risk Protocol 0 No interventions needed ?Moderate Risk Protocol ?High Risk Proctocol ?Risk Level: Good Risk ?Score: 1 ?Electronic Signature(s) ?Signed: 06/13/2021 6:23:55 PM By: Lorrin Jackson ?Entered By: Lorrin Jackson on 06/13/2021 14:06:46 ?

## 2021-06-18 ENCOUNTER — Ambulatory Visit: Payer: Medicare HMO | Admitting: Podiatry

## 2021-06-20 ENCOUNTER — Other Ambulatory Visit: Payer: Self-pay

## 2021-06-20 ENCOUNTER — Other Ambulatory Visit: Payer: Self-pay | Admitting: Internal Medicine

## 2021-06-20 ENCOUNTER — Other Ambulatory Visit (HOSPITAL_COMMUNITY): Payer: Self-pay | Admitting: Internal Medicine

## 2021-06-20 ENCOUNTER — Encounter (HOSPITAL_BASED_OUTPATIENT_CLINIC_OR_DEPARTMENT_OTHER): Payer: Medicare HMO | Admitting: Internal Medicine

## 2021-06-20 DIAGNOSIS — L97512 Non-pressure chronic ulcer of other part of right foot with fat layer exposed: Secondary | ICD-10-CM

## 2021-06-20 DIAGNOSIS — E11621 Type 2 diabetes mellitus with foot ulcer: Secondary | ICD-10-CM

## 2021-06-20 DIAGNOSIS — R2689 Other abnormalities of gait and mobility: Secondary | ICD-10-CM | POA: Diagnosis not present

## 2021-06-20 NOTE — Progress Notes (Signed)
DICKEY, CAAMANO. ([id number]) ?Visit Report for 06/20/2021 ?Chief Complaint Document Details ?Patient Name: Date of Service: ?Hone, DO NA LD M. 06/20/2021 11:00 A M ?Medical Record Number: [id number] ?Patient Account Number: [id number] ?Date of Birth/Sex: Treating RN: ?[birth year] (66 y.o. M) Rolin Barry, Bobbi ?Primary Care Provider: Benita Stabile Other Clinician: ?Referring Provider: ?Treating Provider/Extender: Kalman Shan ?Benita Stabile ?Weeks in Treatment: 1 ?Information Obtained from: Patient ?Chief Complaint ?Right plantar diabetic foot wound ?Electronic Signature(s) ?Signed: 06/20/2021 12:38:48 PM By: Kalman Shan DO ?Entered By: Kalman Shan on 06/20/2021 12:32:54 ?-------------------------------------------------------------------------------- ?HPI Details ?Patient Name: Date of Service: ?Leonardo, DO NA LD M. 06/20/2021 11:00 A M ?Medical Record Number: [id number] ?Patient Account Number: [id number] ?Date of Birth/Sex: Treating RN: ?December 27, 1955 (66 y.o. M) Rolin Barry, Bobbi ?Primary Care Provider: Benita Stabile Other Clinician: ?Referring Provider: ?Treating Provider/Extender: Kalman Shan ?Benita Stabile ?Weeks in Treatment: 1 ?History of Present Illness ?HPI Description: Admission 06/13/2021 ?Mr. Montavius Subramaniam is a 65 year old male with a past medical history of insulin-dependent type 2 diabetes with current self-reported hemoglobin A1c of 6.8, ?and peripheral neuropathy that presents to the clinic for a 44-monthhistory of nonhealing ulcer to the right plantar foot. He follows with Dr. PPosey Pronto podiatry for ?this issue. He has been using Betadine wet-to-dry dressings. He is not currently using any offloading device. He denies systemic signs of infection. ?06/20/2021; patient presents for follow-up. He obtained his x-ray of the right foot. He has not heard from vein and vascular about setting up an appointment for ?his ABIs. He has been using Hydrofera Blue to the wound bed and using his front offloading shoe. He  reports improvement in wound healing. ?Electronic Signature(s) ?Signed: 06/20/2021 12:38:48 PM By: HKalman ShanDO ?Entered By: HKalman Shanon 06/20/2021 12:34:09 ?-------------------------------------------------------------------------------- ?Physical Exam Details ?Patient Name: Date of Service: ?Parfitt, DO NA LD M. 06/20/2021 11:00 A M ?Medical Record Number: [id number]?Patient Account Number: [id number]?Date of Birth/Sex: Treating RN: ?5Apr 15, 1957((66y.o. M) DRolin Barry Bobbi ?Primary Care Provider: Other Clinician: ?TBenita Stabile?Referring Provider: ?Treating Provider/Extender: HKalman Shan?TBenita Stabile?Weeks in Treatment: 1 ?Constitutional ?respirations regular, non-labored and within target range for patient..Marland Kitchen?Cardiovascular ?2+ dorsalis pedis/posterior tibialis pulses. ?Psychiatric ?pleasant and cooperative. ?Notes ?Right foot: T the plantar foot there is an open wound with pale granulation tissue and circumferential undermining. No maceration noted. No surrounding signs ?o ?of infection. ?Electronic Signature(s) ?Signed: 06/20/2021 12:38:48 PM By: HKalman ShanDO ?Entered By: HKalman Shanon 06/20/2021 12:34:56 ?-------------------------------------------------------------------------------- ?Physician Orders Details ?Patient Name: Date of Service: ?Borden, DO NA LD M. 06/20/2021 11:00 A M ?Medical Record Number: [id number]?Patient Account Number: [id number]?Date of Birth/Sex: Treating RN: ?510-07-1955((66y.o. MBurnadette Pop Lauren ?Primary Care Provider: TBenita StabileOther Clinician: ?Referring Provider: ?Treating Provider/Extender: HKalman Shan?TBenita Stabile?Weeks in Treatment: 1 ?Verbal / Phone Orders: No ?Diagnosis Coding ?ICD-10 Coding ?Code Description ?L97.512 Non-pressure chronic ulcer of other part of right foot with fat layer exposed ?E11.621 Type 2 diabetes mellitus with foot ulcer ?R26.89 Other abnormalities of gait and mobility ?Follow-up Appointments ?Other: - Call Vein and  Vascular DDamita Dunningsto schedule arterial study tests at [phone]?Call [employer] for FKohl'sif you have not heard from the company by next Tuesday at [phone]. ?Call AOlgaif no one has called to schedule you an appointment at least by next Tuesday at [phone]. ?Someone will call you to schedule the MRI. ?Please call [facility] [place] not [place] any appointment scheduled  for [place] or have the [place] ?Boot. ?Bathing/ Shower/ Hygiene ?May shower and wash wound with soap and water. - with antibacterial soap with dressing changes ?Edema Control - Lymphedema / SCD / Other ?Avoid standing for long periods of time. ?Off-Loading ?Wedge shoe to: - Animal nutritionist until [employer] obtained. ?Other: - Will order Defender Boot ?Additional Orders / Instructions ?Follow Nutritious Diet - Monitor/Control Blood Sugars ?Wound Treatment ?Wound #1 - Foot Wound Laterality: Plantar, Right ?Cleanser: Soap and Water 1 x Per Day/30 Days ?Discharge Instructions: May shower and wash wound with dial antibacterial soap and water prior to dressing change. ?Prim Dressing: Hydrofera Blue Ready Foam, 2.5 x2.5 in (Generic) 1 x Per Day/30 Days ?ary ?Discharge Instructions: Apply to wound bed as instructed ?Secondary Dressing: Woven Gauze Sponges 2x2 in (Generic) 1 x Per Day/30 Days ?Discharge Instructions: Apply over primary dressing as directed. ?Secured With: Child psychotherapist, Sterile 2x75 (in/in) (Generic) 1 x Per Day/30 Days ?Discharge Instructions: Secure with stretch gauze as directed. ?Secured With: 3M Medipore H Soft Cloth Surgical T ape, 4 x 10 (in/yd) (Generic) 1 x Per Day/30 Days ?Discharge Instructions: Secure with tape as directed. ?Radiology ?MRI, right foot with and without contrast - MRI with and without contrast of right foot related to nonhealing diabetic foot ulcer looking for infection. ?CPT -  (ICD10 E11.621 - Type 2 diabetes mellitus with foot ulcer) ?[employer] ?[place] - Referral to [place] with Dr. Heber [place]. - (ICD10 [phone] - Non-pressure chronic ?A ?ulcer of other part of right foot with fat layer exposed) ?Electronic Signature(s) ?Signed: 06/20/2021 12:38:48 PM By: Kalman Shan DO ?Entered By: Kalman Shan on 06/20/2021 12:35:16 ?Prescription 06/20/2021 ?-------------------------------------------------------------------------------- ?Jeronimo Greaves. Kalman Shan DO ?Patient Name: ?Provider: ?[birth year] [id number] ?Date of Birth: ?NPI#: ?Jerilynn Mages [id number] ?Sex: ?DEA #: ?[phone] ?Phone #: ?License #: ?[place] ?Patient Address: ?[address] ?[place], [place] [address] [address] ?[place], [place] [address] ?[phone] ?Allergies ?No Known Allergies ?Provider's Orders ?[place]: [phone] - Referral to [place] with Dr. Heber [place]. ?A ?Hand Signature: ?Date(s): ?Prescription 06/20/2021 ?Gilday, Oryan M. Kalman Shan DO ?Patient Name: ?Provider: ?09-Dec-1955 [id number] ?Date of Birth: ?NPI#: ?Jerilynn Mages [id number] ?Sex: ?DEA #: ?[phone] ?Phone #: ?License #: ?[place] ?Patient Address: ?[address] ?[place], [place] [address] [address] ?[place],  [address] ?[phone] ?Allergies ?No Known Allergies ?Provider's Orders ?MRI, right foot with and without contrast - ICD10: E11.621 - MRI with and without contrast of right foot related to nonhealing diabetic foot ulcer ?looking for infection. CPT ?Hand Signature: ?Date(s): ?Electronic Signature(s) ?Signed: 06/20/2021 12:38:48 PM By: Kalman Shan DO ?Entered By: Kalman Shan on 06/20/2021 12:35:16 ?-------------------------------------------------------------------------------- ?Problem List Details ?Patient  Name: ?Date of Service: ?Gellatly, DO NA LD M. 06/20/2021 11:00 A M ?Medical Record Number: [id number] ?Patient Account Number: [id number] ?Date of Birth/Sex: ?Treating RN: ?09/15/1955 (66 y.o. Burnadette Pop, Lauren

## 2021-06-21 NOTE — Progress Notes (Addendum)
Christopher Escobar, Christopher Escobar. ([id number]) ?Visit Report for 06/20/2021 ?Arrival Information Details ?Patient Name: Date of Service: ?[name], [name] 06/20/2021 11:00 A M ?Medical Record Number: [id number] ?Patient Account Number: [id number] ?Date of Birth/Sex: Treating RN: ?November 04, 1955 (66 y.o. Burnadette Pop, Lauren ?Primary Care Allyah Heather: Benita Stabile Other Clinician: ?Referring Chetara Kropp: ?Treating Christopher Escobar ?Benita Stabile ?Weeks in Treatment: 1 ?Visit Information History Since Last Visit ?Added or deleted any medications: No ?Patient Arrived: Ambulatory ?Any new allergies or adverse reactions: No ?Arrival Time: 11:09 ?Had a fall or experienced change in No ?Accompanied By: self ?activities of daily living that may affect ?Transfer Assistance: None ?risk of falls: ?Patient Identification Verified: Yes ?Signs or symptoms of abuse/neglect since last visito No ?Secondary Verification Process Completed: Yes ?Hospitalized since last visit: No ?Patient Requires Transmission-Based Precautions: No ?Implantable device outside of the clinic excluding No ?Patient Has Alerts: No ?cellular tissue based products placed in the center ?since last visit: ?Has Dressing in Place as Prescribed: Yes ?Pain Present Now: Yes ?Electronic Signature(s) ?Signed: 06/21/2021 12:10:41 PM By: Rhae Hammock RN ?Entered By: Rhae Hammock on 06/20/2021 11:10:17 ?-------------------------------------------------------------------------------- ?Clinic Level of Care Assessment Details ?Patient Name: Date of Service: ?[name], [name] 06/20/2021 11:00 A M ?Medical Record Number: [id number] ?Patient Account Number: [id number] ?Date of Birth/Sex: Treating RN: ?[birth year] (66 y.o. M) Rolin Barry, Bobbi ?Primary Care Hershy Flenner: Benita Stabile Other Clinician: ?Referring Monicka Cyran: ?Treating Denard Tuminello/Extender: Kalman Escobar ?Benita Stabile ?Weeks in Treatment: 1 ?Clinic Level of Care Assessment Items ?TOOL 4 Quantity Score ?X- 1 0 ?Use when only an  EandM is performed on FOLLOW-UP visit ?ASSESSMENTS - Nursing Assessment / Reassessment ?X- 1 10 ?Reassessment of Co-morbidities (includes updates in patient status) ?X- 1 5 ?Reassessment of Adherence to Treatment Plan ?ASSESSMENTS - Wound and Skin A ssessment / Reassessment ?X - Simple Wound Assessment / Reassessment - one wound 1 5 ?'[]'$  - 0 ?Complex Wound Assessment / Reassessment - multiple wounds ?X- 1 10 ?Dermatologic / Skin Assessment (not related to wound area) ?ASSESSMENTS - Focused Assessment ?X- 1 5 ?Circumferential Edema Measurements - multi extremities ?X- 1 10 ?Nutritional Assessment / Counseling / Intervention ?'[]'$  - 0 ?Lower Extremity Assessment (monofilament, tuning fork, pulses) ?'[]'$  - 0 ?Peripheral Arterial Disease Assessment (using hand held doppler) ?ASSESSMENTS - Ostomy and/or Continence Assessment and Care ?'[]'$  - 0 ?Incontinence Assessment and Management ?'[]'$  - 0 ?Ostomy Care Assessment and Management (repouching, etc.) ?PROCESS - Coordination of Care ?X - Simple Patient / Family Education for ongoing care 1 15 ?'[]'$  - 0 ?Complex (extensive) Patient / Family Education for ongoing care ?X- 1 10 ?Staff obtains Consents, Records, T Results / Process Orders ?est ?'[]'$  - 0 ?Staff telephones HHA, Nursing Homes / Clarify orders / etc ?'[]'$  - 0 ?Routine Transfer to another Facility (non-emergent condition) ?'[]'$  - 0 ?Routine Hospital Admission (non-emergent condition) ?'[]'$  - 0 ?New Admissions / Biomedical engineer / Ordering NPWT Apligraf, etc. ?, ?'[]'$  - 0 ?Emergency Hospital Admission (emergent condition) ?X- 1 10 ?Simple Discharge Coordination ?'[]'$  - 0 ?Complex (extensive) Discharge Coordination ?PROCESS - Special Needs ?'[]'$  - 0 ?Pediatric / Minor Patient Management ?'[]'$  - 0 ?Isolation Patient Management ?'[]'$  - 0 ?Hearing / Language / Visual special needs ?'[]'$  - 0 ?Assessment of Community assistance (transportation, D/C planning, etc.) ?'[]'$  - 0 ?Additional assistance / Altered mentation ?'[]'$  - 0 ?Support Surface(s)  Assessment (bed, cushion, seat, etc.) ?INTERVENTIONS - Wound Cleansing / Measurement ?X - Simple Wound Cleansing - one wound 1 5 ?'[]'$  - 0 ?Complex Wound Cleansing -  multiple wounds ?X- 1 5 ?Wound Imaging (photographs - any number of wounds) ?'[]'$  - 0 ?Wound Tracing (instead of photographs) ?X- 1 5 ?Simple Wound Measurement - one wound ?'[]'$  - 0 ?Complex Wound Measurement - multiple wounds ?INTERVENTIONS - Wound Dressings ?X - Small Wound Dressing one or multiple wounds 1 10 ?'[]'$  - 0 ?Medium Wound Dressing one or multiple wounds ?'[]'$  - 0 ?Large Wound Dressing one or multiple wounds ?'[]'$  - 0 ?Application of Medications - topical ?'[]'$  - 0 ?Application of Medications - injection ?INTERVENTIONS - Miscellaneous ?'[]'$  - 0 ?External ear exam ?'[]'$  - 0 ?Specimen Collection (cultures, biopsies, blood, body fluids, etc.) ?'[]'$  - 0 ?Specimen(s) / Culture(s) sent or taken to Lab for analysis ?'[]'$  - 0 ?Patient Transfer (multiple staff / Civil Service fast streamer / Similar devices) ?'[]'$  - 0 ?Simple Staple / Suture removal (25 or less) ?'[]'$  - 0 ?Complex Staple / Suture removal (26 or more) ?'[]'$  - 0 ?Hypo / Hyperglycemic Management (close monitor of Blood Glucose) ?'[]'$  - 0 ?Ankle / Brachial Index (ABI) - Christopher not check if billed separately ?X- 1 5 ?Vital Signs ?Has the patient been seen at the hospital within the last three years: Yes ?Total Score: 110 ?Level Of Care: New/Established - Level 3 ?Electronic Signature(s) ?Signed: 06/20/2021 5:33:59 PM By: Deon Pilling RN, BSN ?Entered By: Deon Pilling on 06/20/2021 12:21:59 ?-------------------------------------------------------------------------------- ?Encounter Discharge Information Details ?Patient Name: Date of Service: ?[name], [name] 06/20/2021 11:00 A M ?Medical Record Number: [id number] ?Patient Account Number: [id number] ?Date of Birth/Sex: Treating RN: ?November 01, 1955 (66 y.o. M) Rolin Barry, Bobbi ?Primary Care Marcella Dunnaway: Benita Stabile Other Clinician: ?Referring Saahas Hidrogo: ?Treating Vianka Ertel/Extender: Kalman Escobar ?Benita Stabile ?Weeks in Treatment: 1 ?Encounter Discharge Information Items ?Discharge Condition: Stable ?Ambulatory Status: Ambulatory ?Discharge Destination: Home ?Transportation: Ambulance ?Accompanied By: self ?Schedule Follow-up Appointment: Yes ?Clinical Summary of Care: ?Electronic Signature(s) ?Signed: 06/20/2021 5:33:59 PM By: Deon Pilling RN, BSN ?Entered By: Deon Pilling on 06/20/2021 12:23:32 ?-------------------------------------------------------------------------------- ?Lower Extremity Assessment Details ?Patient Name: Date of Service: ?Puett, [name] 06/20/2021 11:00 A M ?Medical Record Number: [id number] ?Patient Account Number: [id number] ?Date of Birth/Sex: Treating RN: ?07-12-1955 (66 y.o. Burnadette Pop, Lauren ?Primary Care Shlonda Dolloff: Benita Stabile Other Clinician: ?Referring Patria Warzecha: ?Treating Lariah Fleer/Extender: Kalman Escobar ?Benita Stabile ?Weeks in Treatment: 1 ?Edema Assessment ?Assessed: [Left: No] [Right: Yes] ?[Left: Edema] [Right: :] ?Calf ?Left: Right: ?Point of Measurement: 33 cm From Medial Instep 39 cm ?Ankle ?Left: Right: ?Point of Measurement: 12 cm From Medial Instep 21.5 cm ?Vascular Assessment ?Pulses: ?Dorsalis Pedis ?Palpable: [Right:Yes] ?Posterior Tibial ?Palpable: [Right:Yes] ?Electronic Signature(s) ?Signed: 06/21/2021 12:10:41 PM By: Rhae Hammock RN ?Entered By: Rhae Hammock on 06/20/2021 11:15:24 ?-------------------------------------------------------------------------------- ?Multi Wound Chart Details ?Patient Name: ?Date of Service: ?Rita, [name] 06/20/2021 11:00 A M ?Medical Record Number: [id number] ?Patient Account Number: [id number] ?Date of Birth/Sex: ?Treating RN: ?[birth year] (66 y.o. M) Rolin Barry, Bobbi ?Primary Care Aarib Pulido: Benita Stabile ?Other Clinician: ?Referring Lylah Lantis: ?Treating Liborio Saccente/Extender: Kalman Escobar ?Benita Stabile ?Weeks in Treatment: 1 ?Vital Signs ?Height(in): 71 ?Capillary Blood Glucose(mg/dl): 139 ?Weight(lbs):  248 ?Pulse(bpm): 74 ?Body Mass Index(BMI): 34.6 ?Blood Pressure(mmHg): 113/73 ?Temperature(??F): 97.5 ?Respiratory Rate(breaths/min): 17 ?Photos: [N/A:N/A] ?Right, Plantar Foot N/A N/A ?Wound Location: ?Blister N/A N/

## 2021-06-26 ENCOUNTER — Encounter: Payer: Medicare HMO | Attending: Internal Medicine | Admitting: Internal Medicine

## 2021-06-26 DIAGNOSIS — R2689 Other abnormalities of gait and mobility: Secondary | ICD-10-CM | POA: Diagnosis not present

## 2021-06-26 DIAGNOSIS — I10 Essential (primary) hypertension: Secondary | ICD-10-CM | POA: Insufficient documentation

## 2021-06-26 DIAGNOSIS — Z89421 Acquired absence of other right toe(s): Secondary | ICD-10-CM | POA: Insufficient documentation

## 2021-06-26 DIAGNOSIS — E11621 Type 2 diabetes mellitus with foot ulcer: Secondary | ICD-10-CM | POA: Insufficient documentation

## 2021-06-26 DIAGNOSIS — E1142 Type 2 diabetes mellitus with diabetic polyneuropathy: Secondary | ICD-10-CM | POA: Diagnosis not present

## 2021-06-26 DIAGNOSIS — G4730 Sleep apnea, unspecified: Secondary | ICD-10-CM | POA: Insufficient documentation

## 2021-06-26 DIAGNOSIS — L97512 Non-pressure chronic ulcer of other part of right foot with fat layer exposed: Secondary | ICD-10-CM | POA: Insufficient documentation

## 2021-06-26 NOTE — Progress Notes (Addendum)
MARKAIL, DIEKMAN. ([id number]) ?Visit Report for 06/26/2021 ?Allergy List Details ?Patient Name: Christopher Escobar, Christopher Escobar. ?Date of Service: 06/26/2021 3:15 PM ?Medical Record Number: [id number] ?Patient Account Number: [id number] ?Date of Birth/Sex: [birth year] (66 y.o. M) ?Treating RN: Donnamarie Poag ?Primary Care Carena Stream: Benita Stabile Other Clinician: ?Referring Kacie Huxtable: Boneta Lucks ?Treating Jenisa Monty/Extender: Kalman Shan ?Weeks in Treatment: 0 ?Allergies ?Active Allergies ?No Known Allergies ?Allergy Notes ?Electronic Signature(s) ?Signed: 06/26/2021 1:24:43 PM By: Donnamarie Poag ?Entered ByDonnamarie Poag on 06/26/2021 13:24:43 ?Christopher Escobar, Christopher Escobar. ([id number]) ?-------------------------------------------------------------------------------- ?Arrival Information Details ?Patient Name: Christopher Escobar, Christopher Escobar. ?Date of Service: 06/26/2021 3:15 PM ?Medical Record Number: [id number] ?Patient Account Number: [id number] ?Date of Birth/Sex: [birth year] (66 y.o. M) ?Treating RN: Donnamarie Poag ?Primary Care Eban Weick: Benita Stabile Other Clinician: ?Referring Lahela Woodin: Boneta Lucks ?Treating Detravion Tester/Extender: Kalman Shan ?Weeks in Treatment: 0 ?Visit Information ?Patient Arrived: Ambulatory ?Arrival Time: 15:15 ?Accompanied By: self ?Transfer Assistance: None ?Patient Identification Verified: Yes ?Secondary Verification Process Completed: Yes ?Patient Requires Transmission-Based Precautions: No ?Patient Has Alerts: Yes ?Patient Alerts: DIABETIC ?Electronic Signature(s) ?Signed: 06/26/2021 3:52:05 PM By: Donnamarie Poag ?Entered By: Donnamarie Poag on 06/26/2021 15:16:10 ?Christopher Escobar ([id number]) ?-------------------------------------------------------------------------------- ?Clinic Level of Care Assessment Details ?Patient Name: Christopher Escobar, Christopher Escobar. ?Date of Service: 06/26/2021 3:15 PM ?Medical Record Number: [id number] ?Patient Account Number: [id number] ?Date of Birth/Sex: [birth year] (66 y.o. M) ?Treating RN: Donnamarie Poag ?Primary Care Tymara Saur:  Benita Stabile Other Clinician: ?Referring Zanyla Klebba: Boneta Lucks ?Treating Starkeisha Vanwinkle/Extender: Kalman Shan ?Weeks in Treatment: 0 ?Clinic Level of Care Assessment Items ?TOOL 2 Quantity Score ?'[]'$  - Use when only an EandM is performed on the INITIAL visit 0 ?ASSESSMENTS - Nursing Assessment / Reassessment ?X - General Physical Exam (combine w/ comprehensive assessment (listed just below) when performed on new ?1 20 ?pt. evals) ?X- 1 25 ?Comprehensive Assessment (HX, ROS, Risk Assessments, Wounds Hx, etc.) ?ASSESSMENTS - Wound and Skin Assessment / Reassessment ?X - Simple Wound Assessment / Reassessment - one wound 1 5 ?'[]'$  - 0 ?Complex Wound Assessment / Reassessment - multiple wounds ?'[]'$  - 0 ?Dermatologic / Skin Assessment (not related to wound area) ?ASSESSMENTS - Ostomy and/or Continence Assessment and Care ?'[]'$  - Incontinence Assessment and Management 0 ?'[]'$  - 0 ?Ostomy Care Assessment and Management (repouching, etc.) ?PROCESS - Coordination of Care ?X - Simple Patient / Family Education for ongoing care 1 15 ?'[]'$  - 0 ?Complex (extensive) Patient / Family Education for ongoing care ?'[]'$  - 0 ?Staff obtains Consents, Records, Test Results / Process Orders ?'[]'$  - 0 ?Staff telephones HHA, Nursing Homes / Clarify orders / etc ?'[]'$  - 0 ?Routine Transfer to another Facility (non-emergent condition) ?'[]'$  - 0 ?Routine Hospital Admission (non-emergent condition) ?X- 1 15 ?New Admissions / Biomedical engineer / Ordering NPWT, Apligraf, etc. ?'[]'$  - 0 ?Emergency Hospital Admission (emergent condition) ?X- 1 10 ?Simple Discharge Coordination ?'[]'$  - 0 ?Complex (extensive) Discharge Coordination ?PROCESS - Special Needs ?'[]'$  - Pediatric / Minor Patient Management 0 ?'[]'$  - 0 ?Isolation Patient Management ?'[]'$  - 0 ?Hearing / Language / Visual special needs ?'[]'$  - 0 ?Assessment of Community assistance (transportation, D/C planning, etc.) ?'[]'$  - 0 ?Additional assistance / Altered mentation ?'[]'$  - 0 ?Support Surface(s) Assessment (bed,  cushion, seat, etc.) ?INTERVENTIONS - Wound Cleansing / Measurement ?X - Wound Imaging (photographs - any number of wounds) 1 5 ?'[]'$  - 0 ?Wound Tracing (instead of photographs) ?X- 1 5 ?Simple Wound Measurement - one wound ?'[]'$  - 0 ?Complex Wound Measurement - multiple wounds ?Christopher Escobar, Christopher Escobar. ([id number]) ?X- 1 5 ?Simple Wound  Cleansing - one wound ?'[]'$  - 0 ?Complex Wound Cleansing - multiple wounds ?INTERVENTIONS - Wound Dressings ?X - Small Wound Dressing one or multiple wounds 1 10 ?'[]'$  - 0 ?Medium Wound Dressing one or multiple wounds ?'[]'$  - 0 ?Large Wound Dressing one or multiple wounds ?'[]'$  - 0 ?Application of Medications - injection ?INTERVENTIONS - Miscellaneous ?'[]'$  - External ear exam 0 ?'[]'$  - 0 ?Specimen Collection (cultures, biopsies, blood, body fluids, etc.) ?'[]'$  - 0 ?Specimen(s) / Culture(s) sent or taken to Lab for analysis ?'[]'$  - 0 ?Patient Transfer (multiple staff / Civil Service fast streamer / Similar devices) ?'[]'$  - 0 ?Simple Staple / Suture removal (25 or less) ?'[]'$  - 0 ?Complex Staple / Suture removal (26 or more) ?'[]'$  - 0 ?Hypo / Hyperglycemic Management (close monitor of Blood Glucose) ?'[]'$  - 0 ?Ankle / Brachial Index (ABI) - do not check if billed separately ?Has the patient been seen at the hospital within the last three years: Yes ?Total Score: 115 ?Level Of Care: New/Established - Level ?3 ?Electronic Signature(s) ?Unsigned ?Entered ByCarlene Coria on 06/26/2021 16:20:22 ?Signature(s): ?Date(s): ?Christopher Escobar, Christopher Escobar ([id number]) ?-------------------------------------------------------------------------------- ?Encounter Discharge Information Details ?Patient Name: Christopher Escobar, Christopher Escobar. ?Date of Service: 06/26/2021 3:15 PM ?Medical Record Number: [id number] ?Patient Account Number: [id number] ?Date of Birth/Sex: April 13, 1955 (66 y.o. M) ?Treating RN: Donnamarie Poag ?Primary Care Everette Mall: Benita Stabile Other Clinician: ?Referring Maytal Mijangos: Boneta Lucks ?Treating Delshon Blanchfield/Extender: Kalman Shan ?Weeks in Treatment: 0 ?Encounter  Discharge Information Items Post Procedure Vitals ?Discharge Condition: Stable ?Temperature (?F): 97.7 ?Ambulatory Status: Ambulatory ?Pulse (bpm): 90 ?Discharge Destination: Home ?Respiratory Rate (breaths/min): 16 ?Transportation: Private Auto ?Blood Pressure (mmHg): 106/73 ?Accompanied By: self ?Schedule Follow-up Appointment: Yes ?Clinical Summary of Care: ?Electronic Signature(s) ?Signed: 06/27/2021 1:26:53 PM By: Donnamarie Poag ?Entered ByDonnamarie Poag on 06/26/2021 16:16:33 ?Christopher Escobar, Christopher Escobar. ([id number]) ?-------------------------------------------------------------------------------- ?Lower Extremity Assessment Details ?Patient Name: Christopher Escobar, Christopher Escobar. ?Date of Service: 06/26/2021 3:15 PM ?Medical Record Number: [id number] ?Patient Account Number: [id number] ?Date of Birth/Sex: 1955-10-27 (66 y.o. M) ?Treating RN: Donnamarie Poag ?Primary Care Ophie Burrowes: Benita Stabile Other Clinician: ?Referring Darianny Momon: Boneta Lucks ?Treating Annasofia Pohl/Extender: Kalman Shan ?Weeks in Treatment: 0 ?Edema Assessment ?Assessed: [Left: No] [Right: Yes] ?Edema: [Left: N] [Right: o] ?Calf ?Left: Right: ?Point of Measurement: 36 cm From Medial Instep 40 cm ?Ankle ?Left: Right: ?Point of Measurement: 11 cm From Medial Instep 21.3 cm ?Knee To Floor ?Left: Right: ?From Medial Instep 47 cm ?Vascular Assessment ?Pulses: ?Dorsalis Pedis ?Palpable: [Right:Yes] ?Notes ?ABI/TBI ordered at vascular; non compressible in clinic bilateral ?Electronic Signature(s) ?Signed: 06/26/2021 3:52:05 PM By: Donnamarie Poag ?Previous Signature: 06/26/2021 1:29:02 PM Version By: Donnamarie Poag ?Entered ByDonnamarie Poag on 06/26/2021 15:27:36 ?Christopher Escobar, Christopher Escobar. ([id number]) ?-------------------------------------------------------------------------------- ?Multi Wound Chart Details ?Patient Name: Christopher Escobar, Christopher Escobar. ?Date of Service: 06/26/2021 3:15 PM ?Medical Record Number: [id number] ?Patient Account Number: [id number] ?Date of Birth/Sex: Nov 20, 1955 (66 y.o. M) ?Treating RN:  Donnamarie Poag ?Primary Care Ellysia Char: Benita Stabile Other Clinician: ?Referring Stephens Shreve: Boneta Lucks ?Treating Spring San/Extender: Kalman Shan ?Weeks in Treatment: 0 ?Vital Signs ?Height(in): 71 Capillary Blo

## 2021-06-26 NOTE — Progress Notes (Signed)
MASSON, NALEPA. ([id number]) ?Visit Report for 06/26/2021 ?Abuse Risk Screen Details ?Patient Name: Christopher Escobar, Christopher Escobar. ?Date of Service: 06/26/2021 3:15 PM ?Medical Record Number: [id number] ?Patient Account Number: [id number] ?Date of Birth/Sex: 12/05/1955 (66 y.o. M) ?Treating RN: Donnamarie Poag ?Primary Care Jeanet Lupe: Benita Stabile Other Clinician: ?Referring Toryn Mcclinton: Boneta Lucks ?Treating Smitty Ackerley/Extender: Kalman Shan ?Weeks in Treatment: 0 ?Abuse Risk Screen Items ?Answer ?ABUSE RISK SCREEN: ?Has anyone close to you tried to hurt or harm you recentlyo No ?Do you feel uncomfortable with anyone in your familyo No ?Has anyone forced you do things that you didnot want to doo No ?Electronic Signature(s) ?Signed: 06/26/2021 3:52:05 PM By: Donnamarie Poag ?Entered ByDonnamarie Poag on 06/26/2021 15:19:47 ?MUSA, REWERTS. ([id number]) ?-------------------------------------------------------------------------------- ?Activities of Daily Living Details ?Patient Name: Christopher Escobar, Christopher Escobar. ?Date of Service: 06/26/2021 3:15 PM ?Medical Record Number: [id number] ?Patient Account Number: [id number] ?Date of Birth/Sex: [birth year] (66 y.o. M) ?Treating RN: Donnamarie Poag ?Primary Care Lynnda Wiersma: Benita Stabile Other Clinician: ?Referring Shawntavia Saunders: Boneta Lucks ?Treating Iori Gigante/Extender: Kalman Shan ?Weeks in Treatment: 0 ?Activities of Daily Living Items ?Answer ?Activities of Daily Living (Please select one for each item) ?[place] ?Take Medications Completely Able ?Use Telephone Completely Able ?Care for Appearance Completely Able ?Use Toilet Completely Able ?Bath / Shower Completely Able ?Dress Self Completely Able ?Feed Self Completely Able ?Walk Completely Able ?Get In / Out Bed Completely Able ?Housework Completely Able ?Prepare Meals Completely Able ?Handle Money Completely Able ?Shop for Self Completely Able ?Electronic Signature(s) ?Signed: 06/26/2021 3:52:05 PM By: Donnamarie Poag ?Entered ByDonnamarie Poag on  06/26/2021 15:20:10 ?JARMAL, LEWELLING. ([id number]) ?-------------------------------------------------------------------------------- ?Education Screening Details ?Patient Name: Christopher Escobar, Christopher Escobar. ?Date of Service: 06/26/2021 3:15 PM ?Medical Record Number: [id number] ?Patient Account Number: [id number] ?Date of Birth/Sex: 04/27/1955 (66 y.o. M) ?Treating RN: Donnamarie Poag ?Primary Care Anay Walter: Benita Stabile Other Clinician: ?Referring Shawnetta Lein: Boneta Lucks ?Treating Ameya Kutz/Extender: Kalman Shan ?Weeks in Treatment: 0 ?Primary Learner Assessed: Patient ?Learning Preferences/Education Level/Primary Language ?Learning Preference: Explanation ?Highest Education Level: High School ?Preferred Language: English ?Cognitive Barrier ?Language Barrier: No ?Translator Needed: No ?Memory Deficit: No ?Emotional Barrier: No ?Cultural/Religious Beliefs Affecting Medical Care: No ?Physical Barrier ?Impaired Vision: No ?Impaired Hearing: No ?Decreased Hand dexterity: No ?Knowledge/Comprehension ?Knowledge Level: High ?Comprehension Level: High ?Ability to understand written instructions: High ?Ability to understand verbal instructions: High ?Motivation ?Anxiety Level: Calm ?Cooperation: Cooperative ?Education Importance: Acknowledges Need ?Interest in Health Problems: Asks Questions ?Perception: Coherent ?Willingness to Engage in Self-Management ?High ?Activities: ?Readiness to Engage in Self-Management ?High ?Activities: ?Electronic Signature(s) ?Signed: 06/26/2021 3:52:05 PM By: Donnamarie Poag ?Entered ByDonnamarie Poag on 06/26/2021 15:20:32 ?Christopher Escobar, Christopher Escobar. ([id number]) ?-------------------------------------------------------------------------------- ?Fall Risk Assessment Details ?Patient Name: Christopher Escobar, Christopher Escobar. ?Date of Service: 06/26/2021 3:15 PM ?Medical Record Number: [id number] ?Patient Account Number: [id number] ?Date of Birth/Sex: [birth year] (66 y.o. M) ?Treating RN: Donnamarie Poag ?Primary Care Chastity Noland: Benita Stabile Other  Clinician: ?Referring Mack Alvidrez: Boneta Lucks ?Treating Farhan Jean/Extender: Kalman Shan ?Weeks in Treatment: 0 ?Fall Risk Assessment Items ?Have you had 2 or more falls in the last 12 monthso 0 No ?Have you had any fall that resulted in injury in the last 12 monthso 0 No ?FALLS RISK SCREEN ?History of falling - immediate or within 3 months 0 No ?Secondary diagnosis (Do you have 2 or more medical diagnoseso) 15 Yes ?Ambulatory aid ?None/bed rest/wheelchair/nurse 0 Yes ?Crutches/cane/walker 0 No ?Furniture 0 No ?Intravenous therapy Access/Saline/Heparin Lock 0 No ?Gait/Transferring ?Normal/ bed rest/ wheelchair 0 Yes ?Weak (short steps with or without shuffle, stooped  but able to lift head while walking, may ?0 No ?seek support from furniture) ?Impaired (short steps with shuffle, may have difficulty arising from chair, head down, impaired ?0 No ?balance) ?Mental Status ?Oriented to own ability 0 Yes ?Electronic Signature(s) ?Signed: 06/26/2021 3:52:05 PM By: Donnamarie Poag ?Entered ByDonnamarie Poag on 06/26/2021 15:21:57 ?Christopher Escobar, Christopher Escobar. ([id number]) ?-------------------------------------------------------------------------------- ?Foot Assessment Details ?Patient Name: Christopher Escobar, Christopher Escobar. ?Date of Service: 06/26/2021 3:15 PM ?Medical Record Number: [id number] ?Patient Account Number: [id number] ?Date of Birth/Sex: 03/27/1956 (66 y.o. M) ?Treating RN: Donnamarie Poag ?Primary Care Dywane Peruski: Benita Stabile Other Clinician: ?Referring Kimaya Whitlatch: Boneta Lucks ?Treating Sahil Milner/Extender: Kalman Shan ?Weeks in Treatment: 0 ?Foot Assessment Items ?Site Locations ?+ = Sensation present, - = Sensation absent, C = Callus, U = Ulcer ?R = Redness, W = Warmth, M = Maceration, PU = Pre-ulcerative lesion ?F = Fissure, S = Swelling, D = Dryness ?Assessment ?Right: Left: ?Other Deformity: No No ?Prior Foot Ulcer: No No ?Prior Amputation: Yes No ?Charcot Joint: No No ?Ambulatory Status: Ambulatory Without Help ?Gait: Steady ?Notes ?prior  amputation of R 2nd toe ?Electronic Signature(s) ?Signed: 06/26/2021 3:52:05 PM By: Donnamarie Poag ?Entered ByDonnamarie Poag on 06/26/2021 15:24:22 ?Christopher Escobar, Christopher Escobar. ([id number]) ?-------------------------------------------------------------------------------- ?Nutrition Risk Screening Details ?Patient Name: Christopher Escobar, Christopher Escobar. ?Date of Service: 06/26/2021 3:15 PM ?Medical Record Number: [id number] ?Patient Account Number: [id number] ?Date of Birth/Sex: 1955-07-13 (66 y.o. M) ?Treating RN: Donnamarie Poag ?Primary Care Kirin Pastorino: Benita Stabile Other Clinician: ?Referring Mckay Tegtmeyer: Boneta Lucks ?Treating Teasha Murrillo/Extender: Kalman Shan ?Weeks in Treatment: 0 ?Height (in): 71 ?Weight (lbs): 254 ?Body Mass Index (BMI): 35.4 ?Nutrition Risk Screening Items ?Score Screening ?NUTRITION RISK SCREEN: ?I have an illness or condition that made me change the kind and/or amount of food I eat 0 No ?I eat fewer than two meals per day 0 No ?I eat few fruits and vegetables, or milk products 0 No ?I have three or more drinks of beer, liquor or wine almost every day 0 No ?I have tooth or mouth problems that make it hard for me to eat 0 No ?I don't always have enough money to buy the food I need 0 No ?I eat alone most of the time 0 No ?I take three or more different prescribed or over-the-counter drugs a day 1 Yes ?Without wanting to, I have lost or gained 10 pounds in the last six months 0 No ?I am not always physically able to shop, cook and/or feed myself 0 No ?Nutrition Protocols ?Good Risk Protocol 0 No interventions needed ?Moderate Risk Protocol ?High Risk Proctocol ?Risk Level: Good Risk ?Score: 1 ?Electronic Signature(s) ?Signed: 06/26/2021 3:52:05 PM By: Donnamarie Poag ?Entered ByDonnamarie Poag on 06/26/2021 15:22:06 ?

## 2021-06-27 NOTE — Progress Notes (Signed)
Christopher, Escobar. ([id number]) ?Visit Report for 06/26/2021 ?Chief Complaint Document Details ?Patient Name: Christopher Escobar, Christopher Escobar. ?Date of Service: 06/26/2021 3:15 PM ?Medical Record Number: [id number] ?Patient Account Number: [id number] ?Date of Birth/Sex: Oct 09, 1955 (66 y.o. M) ?Treating RN: Donnamarie Poag ?Primary Care Provider: Benita Stabile Other Clinician: ?Referring Provider: Boneta Lucks ?Treating Provider/Extender: Kalman Shan ?Weeks in Treatment: 0 ?Information Obtained from: Patient ?Chief Complaint ?06/13/2021; Right plantar diabetic foot wound ?Electronic Signature(s) ?Signed: 06/26/2021 4:15:51 PM By: Kalman Shan DO ?Entered By: Kalman Shan on 06/26/2021 16:10:20 ?Christopher Escobar. ([id number]) ?-------------------------------------------------------------------------------- ?Debridement Details ?Patient Name: Christopher Escobar, Christopher Escobar. ?Date of Service: 06/26/2021 3:15 PM ?Medical Record Number: [id number] ?Patient Account Number: [id number] ?Date of Birth/Sex: 12/09/1955 (66 y.o. M) ?Treating RN: Donnamarie Poag ?Primary Care Provider: Benita Stabile Other Clinician: ?Referring Provider: Boneta Lucks ?Treating Provider/Extender: Kalman Shan ?Weeks in Treatment: 0 ?Debridement Performed for ?Wound #1 Right,Plantar Foot ?Assessment: ?Performed By: Physician Kalman Shan, MD ?Debridement Type: Chemical/Enzymatic/Mechanical ?Agent Used: Gauze and saline ?Severity of Tissue Pre Debridement: Fat layer exposed ?Level of Consciousness (Pre- ?Awake and Alert ?procedure): ?Pre-procedure Verification/Time Out ?Yes - 16:11 ?Taken: ?Start Time: 16:01 ?Pain Control: Lidocaine ?Instrument: ?Other : saline gauze ?Bleeding: None ?Response to Treatment: Procedure was tolerated well ?Level of Consciousness (Post- ?Awake and Alert ?procedure): ?Post Debridement Measurements of Total Wound ?Length: (cm) 1.1 ?Width: (cm) 1 ?Depth: (cm) 0.5 ?Volume: (cm?) 0.432 ?Character of Wound/Ulcer Post Debridement: Improved ?Severity of  Tissue Post Debridement: Fat layer exposed ?Post Procedure Diagnosis ?Same as Pre-procedure ?Electronic Signature(s) ?Signed: 06/26/2021 4:15:51 PM By: Kalman Shan DO ?Signed: 06/27/2021 1:26:53 PM By: Donnamarie Poag ?Entered ByDonnamarie Poag on 06/26/2021 16:03:25 ?Christopher Escobar, Christopher Escobar. ([id number]) ?-------------------------------------------------------------------------------- ?HPI Details ?Patient Name: Christopher Escobar, Christopher Escobar. ?Date of Service: 06/26/2021 3:15 PM ?Medical Record Number: [id number] ?Patient Account Number: [id number] ?Date of Birth/Sex: 1955-05-29 (66 y.o. M) ?Treating RN: Donnamarie Poag ?Primary Care Provider: Benita Stabile Other Clinician: ?Referring Provider: Boneta Lucks ?Treating Provider/Extender: Kalman Shan ?Weeks in Treatment: 0 ?History of Present Illness ?HPI Description: Admission 06/13/2021 ?Christopher Escobar is a 66 year old male with a past medical history of insulin-dependent type 2 diabetes with current self-reported hemoglobin ?A1c of 6.8, and peripheral neuropathy that presents to the clinic for a 45-monthhistory of nonhealing ulcer to the right plantar foot. He follows with ?[facility] for this issue. He has been using Betadine wet-to-dry dressings. He is not currently using any offloading device. He denies ?systemic signs of infection. ?06/20/2021; patient presents for follow-up. He obtained his x-ray of the right foot. He has not heard from vein and vascular about setting up an ?appointment for his ABIs. He has been using Hydrofera Blue to the wound bed and using his front offloading shoe. He reports improvement in ?wound healing. ?3/29; patient presents for follow-up. [facility] I have ?been following him for his diabetic foot wound. He states he received the defender boot and has been using it for the past 3 days without issues. ?He continues to use Hydrofera Blue to the wound bed. He is  scheduled for his MRI on 3/31. He denies signs of infection. ?Electronic Signature(s) ?Signed: 06/26/2021 4:15:51 PM By: HKalman ShanDO ?Entered By: HKalman Shanon 06/26/2021 16:10:43 ?Christopher Escobar, Christopher Escobar ([id number] ?-------------------------------------------------------------------------------- ?Physical Exam Details ?Patient Name: Christopher Escobar, Christopher Escobar ?Date of Service: 06/26/2021 3:15 PM ?Medical Record Number: [id number]?Patient Account Number: [id number]?Date of Birth/Sex: 508/08/1955((67y.o. M) ?Treating RN: BLyndel Safe  Joy ?Primary Care Provider: Benita Stabile Other Clinician: ?Referring Provider: Boneta Lucks ?Treating Provider/Extender: Kalman Shan ?Weeks in Treatment: 0 ?Constitutional ?. ?Cardiovascular ?Marland Kitchen ?Psychiatric ?Marland Kitchen ?Notes ?Right foot: To the plantar aspect there is a punched-out lesion with granulation tissue present. No surrounding signs of infection. ?Electronic Signature(s) ?Signed: 06/26/2021 4:15:51 PM By: Kalman Shan DO ?Entered By: Kalman Shan on 06/26/2021 16:11:16 ?Christopher Escobar, Christopher Escobar. ([id number]) ?-------------------------------------------------------------------------------- ?Physician Orders Details ?Patient Name: Christopher Escobar, Christopher Escobar. ?Date of Service: 06/26/2021 3:15 PM ?Medical Record Number: [id number] ?Patient Account Number: [id number] ?Date of Birth/Sex: 18-Jul-1955 (66 y.o. M) ?Treating RN: Donnamarie Poag ?Primary Care Provider: Benita Stabile Other Clinician: ?Referring Provider: Boneta Lucks ?Treating Provider/Extender: Kalman Shan ?Weeks in Treatment: 0 ?Verbal / Phone Orders: No ?Diagnosis Coding ?Follow-up Appointments ?o Return Appointment in 1 week. ?Bathing/ Shower/ Hygiene ?o May shower; gently cleanse wound with antibacterial soap, rinse and pat dry prior to dressing wounds - keep dressing dry or ?change after shower ?o No tub bath. ?Anesthetic (Use 'Patient Medications' Section for Anesthetic Order Entry) ?o Lidocaine applied to wound bed ?Edema Control -  Lymphedema / Segmental Compressive Device / Other ?o Elevate leg(s) parallel to the floor when sitting. ?o DO YOUR BEST to sleep in the bed at night. DO NOT sleep in your recliner. Long hours of sitting in a recliner leads to ?swelling of the legs and/or potential wounds on your backside. ?Off-Loading ?o Foot Defender o - right foot-keep shin covered for protection ?o Turn and reposition every 2 hours ?Additional Orders / Instructions ?o Follow Nutritious Diet and Increase Protein Intake - monitor blood sugar to maintain normal level ?Wound Treatment ?Wound #1 - Foot Wound Laterality: Plantar, Right ?Cleanser: Soap and Water 1 x Per Day/30 Days ?Discharge Instructions: Gently cleanse wound with antibacterial soap, rinse and pat dry prior to dressing wounds ?Cleanser: Wound Cleanser 1 x Per Day/30 Days ?Discharge Instructions: Wash your hands with soap and water. Remove old dressing, discard into plastic bag and place into trash. ?Cleanse the wound with Wound Cleanser prior to applying a clean dressing using gauze sponges, not tissues or cotton balls. Do not ?scrub or use excessive force. Pat dry using gauze sponges, not tissue or cotton balls. ?Primary Dressing: Hydrofera Blue Ready Transfer Foam, 2.5x2.5 (in/in) 1 x Per Day/30 Days ?Discharge Instructions: Apply Hydrofera Blue Ready to wound bed as directed ?Secondary Dressing: ABD Pad 5x9 (in/in) 1 x Per Day/30 Days ?Discharge Instructions: Cover with ABD pad ?Secured With: Medipore Tape - 27M Medipore H Soft Cloth Surgical Tape, 2x2 (in/yd) 1 x Per Day/30 Days ?Secured With: [employer] or Non-Sterile 6-ply 4.5x4 (yd/yd) 1 x Per Day/30 Days ?Discharge Instructions: Apply Kerlix as directed ?Radiology ?o MRI with and without Contrast - right foot ?Services and Therapies ?o Ankle Brachial Index (ABI)-Bilateral - ABI/TBI ?Electronic Signature(s) ?JACOB, CICERO. ([id number]) ?Signed: 06/26/2021 4:15:51 PM By: Kalman Shan DO ?Signed:  06/27/2021 1:26:53 PM By: Donnamarie Poag ?Entered ByDonnamarie Poag on 06/26/2021 16:11:21 ?Christopher Escobar, Christopher Escobar. ([id number]) ?-------------------------------------------------------------------------------- ?Problem List

## 2021-06-28 ENCOUNTER — Ambulatory Visit (HOSPITAL_COMMUNITY): Admission: RE | Admit: 2021-06-28 | Payer: Medicare HMO | Source: Ambulatory Visit

## 2021-07-02 ENCOUNTER — Ambulatory Visit: Payer: Medicare HMO | Admitting: Internal Medicine

## 2021-07-02 ENCOUNTER — Other Ambulatory Visit (INDEPENDENT_AMBULATORY_CARE_PROVIDER_SITE_OTHER): Payer: Self-pay | Admitting: Internal Medicine

## 2021-07-02 DIAGNOSIS — L97512 Non-pressure chronic ulcer of other part of right foot with fat layer exposed: Secondary | ICD-10-CM

## 2021-07-03 ENCOUNTER — Ambulatory Visit (INDEPENDENT_AMBULATORY_CARE_PROVIDER_SITE_OTHER): Payer: Medicare HMO

## 2021-07-03 ENCOUNTER — Encounter: Payer: Medicare HMO | Attending: Internal Medicine | Admitting: Internal Medicine

## 2021-07-03 DIAGNOSIS — L97512 Non-pressure chronic ulcer of other part of right foot with fat layer exposed: Secondary | ICD-10-CM | POA: Diagnosis not present

## 2021-07-03 DIAGNOSIS — E11621 Type 2 diabetes mellitus with foot ulcer: Secondary | ICD-10-CM | POA: Diagnosis present

## 2021-07-03 DIAGNOSIS — E1142 Type 2 diabetes mellitus with diabetic polyneuropathy: Secondary | ICD-10-CM | POA: Insufficient documentation

## 2021-07-03 DIAGNOSIS — Z794 Long term (current) use of insulin: Secondary | ICD-10-CM | POA: Diagnosis not present

## 2021-07-03 DIAGNOSIS — R2689 Other abnormalities of gait and mobility: Secondary | ICD-10-CM | POA: Diagnosis not present

## 2021-07-03 NOTE — Progress Notes (Signed)
XAVYER, STEENSON. ([id number]) ?Visit Report for 07/03/2021 ?Chief Complaint Document Details ?Patient Name: Christopher Escobar, Christopher Escobar. ?Date of Service: 07/03/2021 2:00 PM ?Medical Record Number: [id number] ?Patient Account Number: [id number] ?Date of Birth/Sex: 09/26/1955 (66 y.o. M) ?Treating RN: Carlene Coria ?Primary Care Provider: Benita Stabile Other Clinician: ?Referring Provider: Benita Stabile ?Treating Provider/Extender: Kalman Shan ?Weeks in Treatment: 1 ?Information Obtained from: Patient ?Chief Complaint ?06/13/2021; Right plantar diabetic foot wound ?Electronic Signature(s) ?Signed: 07/03/2021 3:34:33 PM By: Kalman Shan DO ?Entered By: Kalman Shan on 07/03/2021 15:26:00 ?ANDERSON, MIDDLEBROOKS. ([id number]) ?-------------------------------------------------------------------------------- ?HPI Details ?Patient Name: Christopher Escobar, Christopher Escobar. ?Date of Service: 07/03/2021 2:00 PM ?Medical Record Number: [id number] ?Patient Account Number: [id number] ?Date of Birth/Sex: May 19, 1955 (66 y.o. M) ?Treating RN: Carlene Coria ?Primary Care Provider: Benita Stabile Other Clinician: ?Referring Provider: Benita Stabile ?Treating Provider/Extender: Kalman Shan ?Weeks in Treatment: 1 ?History of Present Illness ?HPI Description: Admission 06/13/2021 ?Mr. Timithy Arons is a 66 year old male with a past medical history of insulin-dependent type 2 diabetes with current self-reported hemoglobin ?A1c of 6.8, and peripheral neuropathy that presents to the clinic for a 63-month history of nonhealing ulcer to the right plantar foot. He follows with ?Dr. Posey Pronto, podiatry for this issue. He has been using Betadine wet-to-dry dressings. He is not currently using any offloading device. He denies ?systemic signs of infection. ?06/20/2021; patient presents for follow-up. He obtained his x-ray of the right foot. He has not heard from vein and vascular about setting up an ?appointment for his ABIs. He has been using Hydrofera Blue to the wound bed and using his  front offloading shoe. He reports improvement in ?wound healing. ?3/29; patient presents for follow-up. He was originally seen at the [facility] site and has transferred over to the [place] wound center. I have ?been following him for his diabetic foot wound. He states he received the defender boot and has been using it for the past 3 days without issues. ?He continues to use Hydrofera Blue to the wound bed. He is scheduled for his MRI on 3/31. He denies signs of infection. ?4/5; patient presents for follow-up. He has been using Hydrofera Blue to the wound bed without issues. He denies signs of infection. He has been ?using the Conservation officer, nature. He was scheduled for his MRI on 3/31 however insurance denied coverage. It is unclear why. The x-ray showed ?suspicious osteo to the second medial met head Where the wound is at. He currently denies systemic signs of infection. He had ABIs completed ?that showed a right ABI of 1.27 and right toe pressure of 0.84 with triphasic waveforms at ATA and PTA. ?Electronic Signature(s) ?Signed: 07/03/2021 3:34:33 PM By: Kalman Shan DO ?Entered By: Kalman Shan on 07/03/2021 15:29:26 ?Christopher Escobar, Christopher Escobar. ([id number]) ?-------------------------------------------------------------------------------- ?Physical Exam Details ?Patient Name: Christopher Escobar, Christopher Escobar. ?Date of Service: 07/03/2021 2:00 PM ?Medical Record Number: [id number] ?Patient Account Number: [id number] ?Date of Birth/Sex: [birth year] (66 y.o. M) ?Treating RN: Carlene Coria ?Primary Care Provider: Benita Stabile Other Clinician: ?Referring Provider: Benita Stabile ?Treating Provider/Extender: Kalman Shan ?Weeks in Treatment: 1 ?Constitutional ?. ?Cardiovascular ?Marland Kitchen ?Psychiatric ?Marland Kitchen ?Notes ?Right foot: To the plantar aspect there is a punched-out lesion with granulation tissue present. No surrounding signs of infection. ?Electronic Signature(s) ?Signed: 07/03/2021 3:34:33 PM By: Kalman Shan DO ?Entered By: Kalman Shan on  07/03/2021 15:29:47 ?Christopher Escobar, Christopher Escobar. ([id number]) ?-------------------------------------------------------------------------------- ?Physician Orders Details ?Patient Name: Christopher Escobar, Christopher Escobar. ?Date of Service: 07/03/2021 2:00 PM ?Medical Record Number: [id number] ?Patient Account Number: [id number] ?Date of Birth/Sex: 16-Mar-1956 (66 y.o. M) ?Treating  RN: Carlene Coria ?Primary Care Provider: Benita Stabile Other Clinician: ?Referring Provider: Benita Stabile ?Treating Provider/Extender: Kalman Shan ?Weeks in Treatment: 1 ?Verbal / Phone Orders: No ?Diagnosis Coding ?Follow-up Appointments ?o Return Appointment in 1 week. ?Bathing/ Shower/ Hygiene ?o May shower; gently cleanse wound with antibacterial soap, rinse and pat dry prior to dressing wounds - keep dressing dry or ?change after shower ?o No tub bath. ?Anesthetic (Use 'Patient Medications' Section for Anesthetic Order Entry) ?o Lidocaine applied to wound bed ?Edema Control - Lymphedema / Segmental Compressive Device / Other ?o Elevate leg(s) parallel to the floor when sitting. ?o DO YOUR BEST to sleep in the bed at night. DO NOT sleep in your recliner. Long hours of sitting in a recliner leads to ?swelling of the legs and/or potential wounds on your backside. ?Off-Loading ?o Foot Defender o - right foot-keep shin covered for protection ?o Turn and reposition every 2 hours ?Additional Orders / Instructions ?o Follow Nutritious Diet and Increase Protein Intake - monitor blood sugar to maintain normal level ?Wound Treatment ?Wound #1 - Foot Wound Laterality: Plantar, Right ?Cleanser: Soap and Water 1 x Per Day/30 Days ?Discharge Instructions: Gently cleanse wound with antibacterial soap, rinse and pat dry prior to dressing wounds ?Cleanser: Wound Cleanser 1 x Per Day/30 Days ?Discharge Instructions: Wash your hands with soap and water. Remove old dressing, discard into plastic bag and place into trash. ?Cleanse the wound with Wound Cleanser prior to  applying a clean dressing using gauze sponges, not tissues or cotton balls. Do not ?scrub or use excessive force. Pat dry using gauze sponges, not tissue or cotton balls. ?Primary Dressing: Hydrofera Blue Ready Transfer Foam, 2.5x2.5 (in/in) 1 x Per Day/30 Days ?Discharge Instructions: Apply Hydrofera Blue Ready to wound bed as directed ?Secondary Dressing: ABD Pad 5x9 (in/in) 1 x Per Day/30 Days ?Discharge Instructions: Cover with ABD pad ?Secured With: Medipore Tape - 50M Medipore H Soft Cloth Surgical Tape, 2x2 (in/yd) 1 x Per Day/30 Days ?Secured With: [employer] or Non-Sterile 6-ply 4.5x4 (yd/yd) 1 x Per Day/30 Days ?Discharge Instructions: Apply Kerlix as directed ?Electronic Signature(s) ?Signed: 07/03/2021 3:34:33 PM By: Kalman Shan DO ?Entered By: Kalman Shan on 07/03/2021 15:33:17 ?Christopher Escobar, Christopher Escobar. ([id number]) ?-------------------------------------------------------------------------------- ?Problem List Details ?Patient Name: Christopher Escobar, Christopher Escobar. ?Date of Service: 07/03/2021 2:00 PM ?Medical Record Number: [id number] ?Patient Account Number: [id number] ?Date of Birth/Sex: 10/29/1955 (66 y.o. M) ?Treating RN: Carlene Coria ?Primary Care Provider: Benita Stabile Other Clinician: ?Referring Provider: Benita Stabile ?Treating Provider/Extender: Kalman Shan ?Weeks in Treatment: 1 ?Active Problems ?ICD-10 ?Encounter ?Code Description Active Date MDM ?Diagnosis ?E11.621 Type 2 diabetes mellitus with foot ulcer 06/26/2021 No Yes ?V49.449 Non-pressure chronic ulcer of other part of right foot with fat layer 06/26/2021 No Yes ?exposed ?R26.89 Other abnormalities of gait and mobility 06/26/2021 No Yes ?Inactive Problems ?Resolved Problems ?Electronic Signature(s) ?Signed: 07/03/2021 3:34:33 PM By: Kalman Shan DO ?Entered By: Kalman Shan on 07/03/2021 15:25:54 ?Christopher Escobar, Christopher Escobar. ([id number]) ?-------------------------------------------------------------------------------- ?Progress Note Details ?Patient  Name: Christopher Escobar, Christopher Escobar. ?Date of Service: 07/03/2021 2:00 PM ?Medical Record Number: [id number] ?Patient Account Number: [id number] ?Date of Birth/Sex: Sep 14, 1955 (66 y.o. M) ?Treating RN: Carlene Coria ?Primary C

## 2021-07-04 NOTE — Progress Notes (Signed)
Christopher, Escobar. ([id number]) ?Visit Report for 07/03/2021 ?Arrival Information Details ?Patient Name: Christopher, Escobar. ?Date of Service: 07/03/2021 2:00 PM ?Medical Record Number: [id number] ?Patient Account Number: [id number] ?Date of Birth/Sex: [birth year] (66 y.o. M) ?Treating RN: Carlene Coria ?Primary Care Mendel Binsfeld: Benita Stabile Other Clinician: ?Referring Taneka Espiritu: Benita Stabile ?Treating Nasiir Monts/Extender: Kalman Shan ?Weeks in Treatment: 1 ?Visit Information History Since Last Visit ?All ordered tests and consults were completed: No ?Patient Arrived: Ambulatory ?Added or deleted any medications: No ?Arrival Time: 14:18 ?Any new allergies or adverse reactions: No ?Accompanied By: self ?Had a fall or experienced change in No ?Transfer Assistance: None ?activities of daily living that may affect ?Patient Identification Verified: Yes ?risk of falls: ?Secondary Verification Process Completed: Yes ?Signs or symptoms of abuse/neglect since last visito No ?Patient Requires Transmission-Based No ?Hospitalized since last visit: No ?Precautions: ?Implantable device outside of the clinic excluding No ?Patient Has Alerts: Yes ?cellular tissue based products placed in the center ?Patient Alerts: DIABETIC ?since last visit: ?AVVS ABI/TBI ?Has Dressing in Place as Prescribed: Yes ?07/03/21 ?Has Footwear/Offloading in Place as Prescribed: Yes ?R 1.27; L 1.23 ?Right: Other:defender boot ?Pain Present Now: No ?Electronic Signature(s) ?Signed: 07/04/2021 9:23:23 AM By: Carlene Coria RN ?Previous Signature: 07/03/2021 2:40:17 PM Version By: Donnamarie Poag ?Entered By: Carlene Coria on 07/03/2021 14:59:13 ?Christopher, Escobar. ([id number]) ?-------------------------------------------------------------------------------- ?Clinic Level of Care Assessment Details ?Patient Name: Christopher, Escobar. ?Date of Service: 07/03/2021 2:00 PM ?Medical Record Number: [id number] ?Patient Account Number: [id number] ?Date of Birth/Sex: Feb 11, 1956 (66 y.o. M) ?Treating  RN: Carlene Coria ?Primary Care Sparsh Callens: Benita Stabile Other Clinician: ?Referring Gabriellia Rempel: Benita Stabile ?Treating Caia Lofaro/Extender: Kalman Shan ?Weeks in Treatment: 1 ?Clinic Level of Care Assessment Items ?TOOL 4 Quantity Score ?X - Use when only an EandM is performed on FOLLOW-UP visit 1 0 ?ASSESSMENTS - Nursing Assessment / Reassessment ?X - Reassessment of Co-morbidities (includes updates in patient status) 1 10 ?X- 1 5 ?Reassessment of Adherence to Treatment Plan ?ASSESSMENTS - Wound and Skin Assessment / Reassessment ?X - Simple Wound Assessment / Reassessment - one wound 1 5 ?'[]'$  - 0 ?Complex Wound Assessment / Reassessment - multiple wounds ?'[]'$  - 0 ?Dermatologic / Skin Assessment (not related to wound area) ?ASSESSMENTS - Focused Assessment ?'[]'$  - Circumferential Edema Measurements - multi extremities 0 ?'[]'$  - 0 ?Nutritional Assessment / Counseling / Intervention ?'[]'$  - 0 ?Lower Extremity Assessment (monofilament, tuning fork, pulses) ?'[]'$  - 0 ?Peripheral Arterial Disease Assessment (using hand held doppler) ?ASSESSMENTS - Ostomy and/or Continence Assessment and Care ?'[]'$  - Incontinence Assessment and Management 0 ?'[]'$  - 0 ?Ostomy Care Assessment and Management (repouching, etc.) ?PROCESS - Coordination of Care ?X - Simple Patient / Family Education for ongoing care 1 15 ?'[]'$  - 0 ?Complex (extensive) Patient / Family Education for ongoing care ?'[]'$  - 0 ?Staff obtains Consents, Records, Test Results / Process Orders ?'[]'$  - 0 ?Staff telephones HHA, Nursing Homes / Clarify orders / etc ?'[]'$  - 0 ?Routine Transfer to another Facility (non-emergent condition) ?'[]'$  - 0 ?Routine Hospital Admission (non-emergent condition) ?'[]'$  - 0 ?New Admissions / Biomedical engineer / Ordering NPWT, Apligraf, etc. ?'[]'$  - 0 ?Emergency Hospital Admission (emergent condition) ?X- 1 10 ?Simple Discharge Coordination ?'[]'$  - 0 ?Complex (extensive) Discharge Coordination ?PROCESS - Special Needs ?'[]'$  - Pediatric / Minor Patient Management  0 ?'[]'$  - 0 ?Isolation Patient Management ?'[]'$  - 0 ?Hearing / Language / Visual special needs ?'[]'$  - 0 ?Assessment of Community assistance (transportation, D/C planning, etc.) ?'[]'$  - 0 ?Additional assistance /  Altered mentation ?'[]'$  - 0 ?Support Surface(s) Assessment (bed, cushion, seat, etc.) ?INTERVENTIONS - Wound Cleansing / Measurement ?Christopher, Escobar. ([id number]) ?X- 1 5 ?Simple Wound Cleansing - one wound ?'[]'$  - 0 ?Complex Wound Cleansing - multiple wounds ?X- 1 5 ?Wound Imaging (photographs - any number of wounds) ?'[]'$  - 0 ?Wound Tracing (instead of photographs) ?X- 1 5 ?Simple Wound Measurement - one wound ?'[]'$  - 0 ?Complex Wound Measurement - multiple wounds ?INTERVENTIONS - Wound Dressings ?X - Small Wound Dressing one or multiple wounds 1 10 ?'[]'$  - 0 ?Medium Wound Dressing one or multiple wounds ?'[]'$  - 0 ?Large Wound Dressing one or multiple wounds ?'[]'$  - 0 ?Application of Medications - topical ?'[]'$  - 0 ?Application of Medications - injection ?INTERVENTIONS - Miscellaneous ?'[]'$  - External ear exam 0 ?'[]'$  - 0 ?Specimen Collection (cultures, biopsies, blood, body fluids, etc.) ?'[]'$  - 0 ?Specimen(s) / Culture(s) sent or taken to Lab for analysis ?'[]'$  - 0 ?Patient Transfer (multiple staff / Civil Service fast streamer / Similar devices) ?'[]'$  - 0 ?Simple Staple / Suture removal (25 or less) ?'[]'$  - 0 ?Complex Staple / Suture removal (26 or more) ?'[]'$  - 0 ?Hypo / Hyperglycemic Management (close monitor of Blood Glucose) ?'[]'$  - 0 ?Ankle / Brachial Index (ABI) - do not check if billed separately ?X- 1 5 ?Vital Signs ?Has the patient been seen at the hospital within the last three years: Yes ?Total Score: 75 ?Level Of Care: New/Established - Level ?2 ?Electronic Signature(s) ?Signed: 07/04/2021 9:23:23 AM By: Carlene Coria RN ?Entered By: Carlene Coria on 07/03/2021 14:50:29 ?Christopher, Escobar. ([id number]) ?-------------------------------------------------------------------------------- ?Encounter Discharge Information Details ?Patient Name: Christopher, Escobar. ?Date of Service: 07/03/2021 2:00 PM ?Medical Record Number: [id number] ?Patient Account Number: [id number] ?Date of Birth/Sex: [birth year] (66 y.o. M) ?Treating RN: Carlene Coria ?Primary Care Lorelei Heikkila: Benita Stabile Other Clinician: ?Referring Lucyann Romano: Benita Stabile ?Treating Hisashi Amadon/Extender: Kalman Shan ?Weeks in Treatment: 1 ?Encounter Discharge Information Items ?Discharge Condition: Stable ?Ambulatory Status: Ambulatory ?Discharge Destination: Home ?Transportation: Private Auto ?Accompanied By: self ?Schedule Follow-up Appointment: Yes ?Clinical Summary of Care: Patient Declined ?Electronic Signature(s) ?Signed: 07/04/2021 9:23:23 AM By: Carlene Coria RN ?Entered By: Carlene Coria on 07/03/2021 14:51:45 ?Christopher, Escobar. ([id number]) ?-------------------------------------------------------------------------------- ?Lower Extremity Assessment Details ?Patient Name: Christopher, Escobar. ?Date of Service: 07/03/2021 2:00 PM ?Medical Record Number: [id number] ?Patient Account Number: [id number] ?Date of Birth/Sex: 05-Dec-1955 (66 y.o. M) ?Treating RN: Carlene Coria ?Primary Care Justa Hatchell: Benita Stabile Other Clinician: ?Referring Sheilah Rayos: Benita Stabile ?Treating Dillan Lunden/Extender: Kalman Shan ?Weeks in Treatment: 1 ?Edema Assessment ?Assessed: [Left: No] [Right: No] ?Edema: [Left: Ye] [Right: s] ?Calf ?Left: Right: ?Point of Measurement: 36 cm From Medial Instep 40 cm ?Ankle ?Left: Right: ?Point of Measurement: 11 cm From Medial Instep 21 cm ?Vascular Assessment ?Pulses: ?Dorsalis Pedis ?Palpable: [Right:Yes] ?Electronic Signature(s) ?Signed: 07/04/2021 9:23:23 AM By: Carlene Coria RN ?Entered By: Carlene Coria on 07/03/2021 14:27:04 ?Christopher, Escobar. ([id number]) ?-------------------------------------------------------------------------------- ?Multi Wound Chart Details ?Patient Name: Christopher, Escobar. ?Date of Service: 07/03/2021 2:00 PM ?Medical Record Number: [id number] ?Patient Account Number: [id number] ?Date of  Birth/Sex: December 24, 1955 (66 y.o. M) ?Treating RN: Carlene Coria ?Primary Care Sipriano Fendley: Benita Stabile Other Clinician: ?Referring Kaysia Willard: Benita Stabile ?Treating Tristian Bouska/Extender: Kalman Shan ?Weeks in [place]

## 2021-07-08 ENCOUNTER — Ambulatory Visit: Payer: Medicare HMO

## 2021-07-10 ENCOUNTER — Encounter: Payer: Medicare HMO | Admitting: Internal Medicine

## 2021-07-10 DIAGNOSIS — E11621 Type 2 diabetes mellitus with foot ulcer: Secondary | ICD-10-CM | POA: Diagnosis not present

## 2021-07-12 ENCOUNTER — Ambulatory Visit
Admission: RE | Admit: 2021-07-12 | Discharge: 2021-07-12 | Disposition: A | Discharge status: Home / Self Care | Payer: Medicare HMO | Source: Ambulatory Visit | Attending: Internal Medicine | Admitting: Internal Medicine

## 2021-07-12 DIAGNOSIS — L97519 Non-pressure chronic ulcer of other part of right foot with unspecified severity: Secondary | ICD-10-CM | POA: Insufficient documentation

## 2021-07-12 DIAGNOSIS — E11621 Type 2 diabetes mellitus with foot ulcer: Secondary | ICD-10-CM | POA: Diagnosis not present

## 2021-07-12 IMAGING — MR MR FOOT*R* WO/W CM
9 series · 40 of 40 positions shown · IV contrast (gadavist)
Comparison: None.

CLINICAL DATA: Right foot pain, no known injury

EXAM:
MRI OF THE RIGHT FOREFOOT WITHOUT AND WITH CONTRAST
TECHNIQUE: Multiplanar, multisequence MR imaging of the right forefoot was
performed before and after the administration of intravenous
contrast.
CONTRAST:  10mL GADAVIST GADOBUTROL 1 MMOL/ML IV SOLN

[Series 5: T1 · coronal · right · 3.0mm · 0.38mm/px · 5 of 45 slices shown (1 of 2)]
[im 1/45]
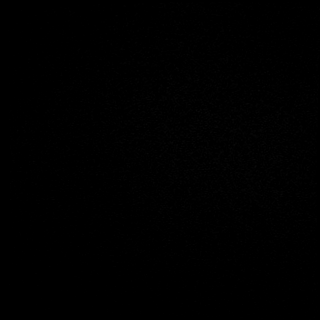
[im 12/45]
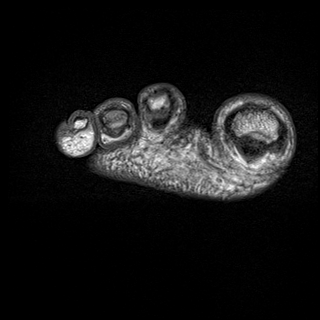
[im 23/45]
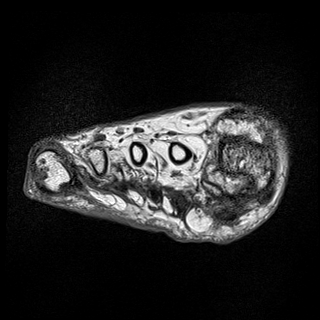
[im 34/45]
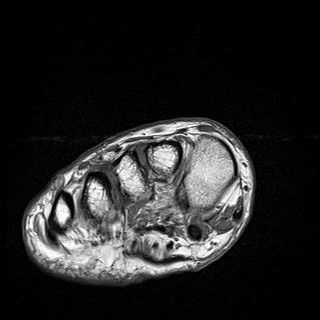
[im 45/45]
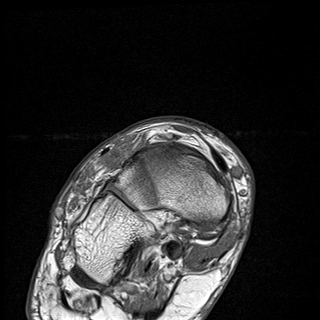

[Series 7: T2 · coronal · right · 3.0mm · 0.38mm/px · 6 of 45 slices shown (1 of 2)]
[im 1/45]
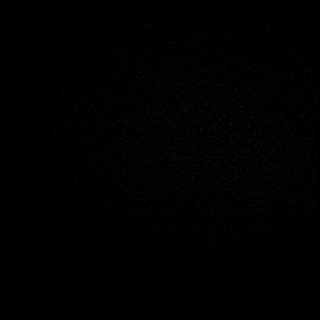
[im 9/45]
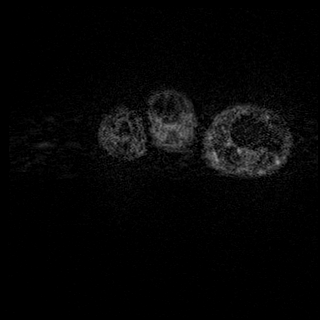
[im 18/45]
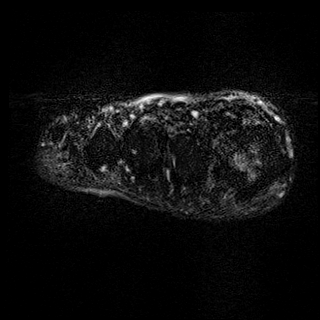
[im 27/45]
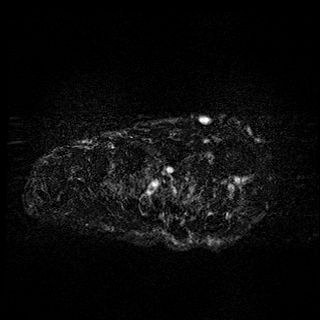
[im 36/45]
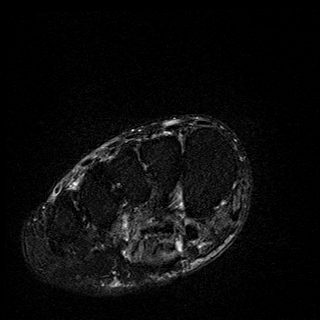
[im 45/45]
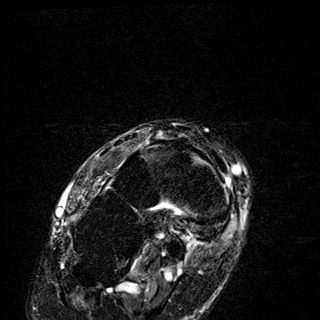

[Series 10: T1 · oblique · right · 3.0mm · 0.70mm/px · 3 of 23 slices shown (2 of 2)]
[im 1/23]
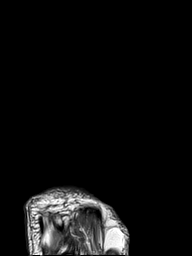
[im 12/23]
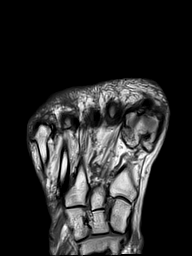
[im 23/23]
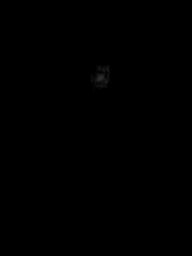

[Series 12: T2 · oblique · right · 3.0mm · 0.70mm/px · 3 of 24 slices shown (2 of 2)]
[im 1/24]
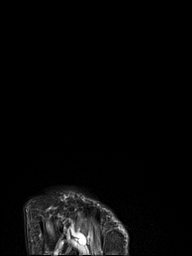
[im 12/24]
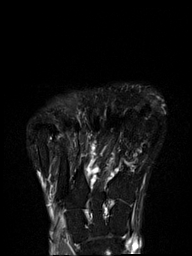
[im 24/24]
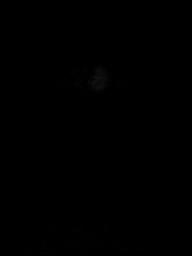

[Series 13: STIR · sagittal · right · 3.0mm · 0.62mm/px · 4 of 33 slices shown]
[im 1/33]
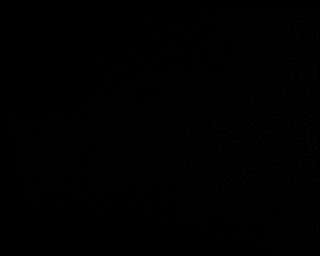
[im 11/33]
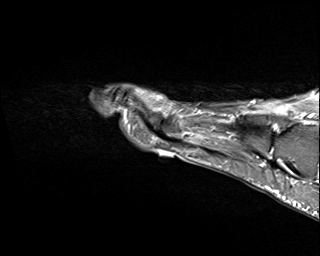
[im 22/33]
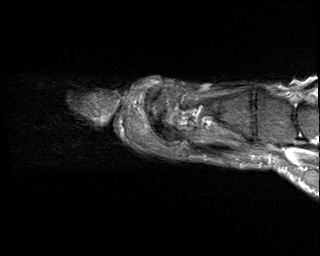
[im 33/33]
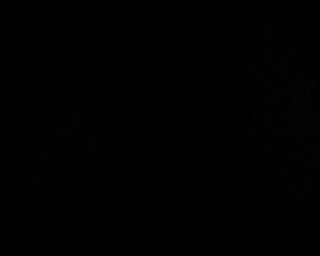

[Series 14: T1 fat-sat · coronal · non-contrast · right · 3.0mm · 0.47mm/px · 6 of 45 slices shown (1 of 3)]
[im 1/45]
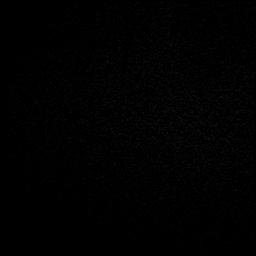
[im 9/45]
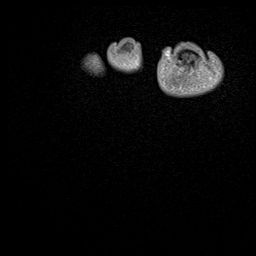
[im 18/45]
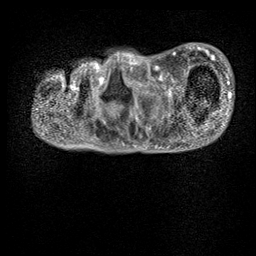
[im 27/45]
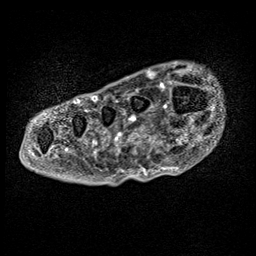
[im 36/45]
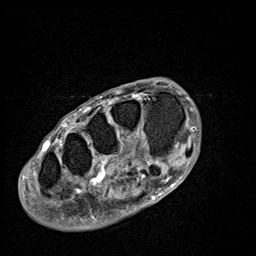
[im 45/45]
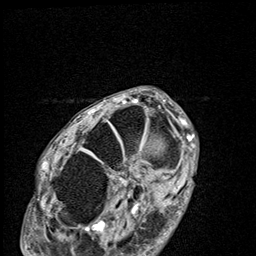

[Series 15: T1 fat-sat post-contrast · coronal · right · 3.0mm · 0.47mm/px · 6 of 44 slices shown]
[im 1/44]
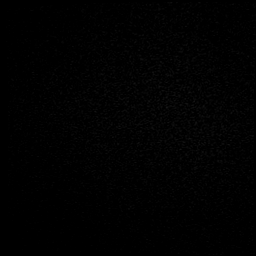
[im 9/44]
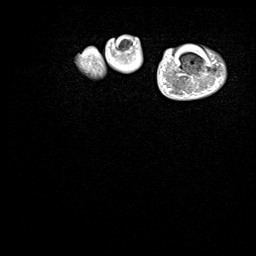
[im 18/44]
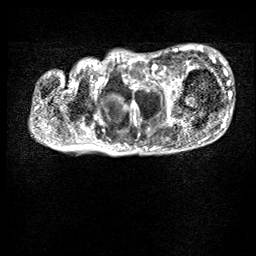
[im 26/44]
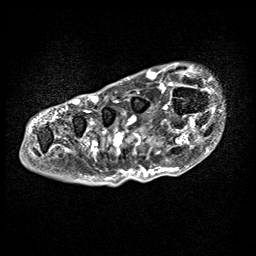
[im 35/44]
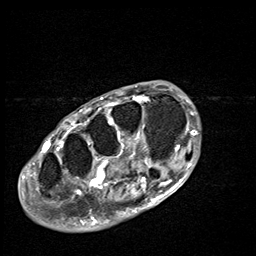
[im 44/44]
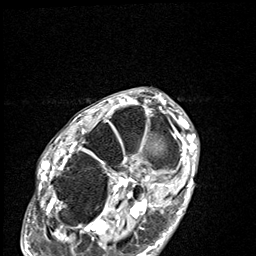

[Series 16: T1 fat-sat · sagittal · right · 3.0mm · 0.62mm/px · 4 of 33 slices shown (2 of 3)]
[im 1/33]
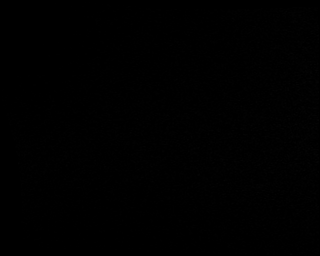
[im 11/33]
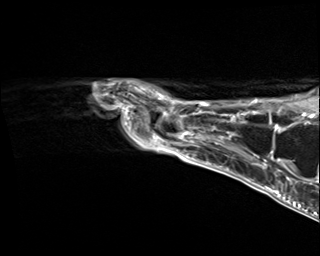
[im 22/33]
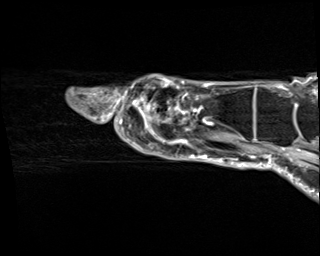
[im 33/33]
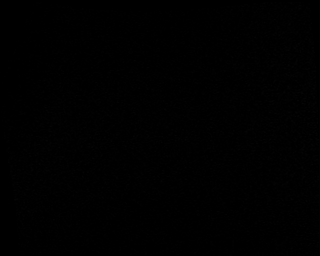

[Series 18: T1 fat-sat · axial · right · 3.0mm · 0.56mm/px · z∈[-144,-71]mm · 3 of 24 slices shown (3 of 3)]
[im 1/24]
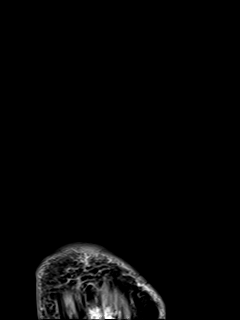
[im 12/24]
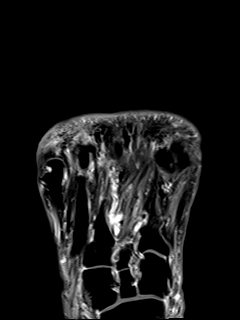
[im 24/24]
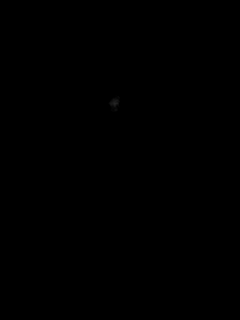

[40 of 40 positions shown; findings below may reference images not displayed]

FINDINGS: Bones/Joint/Cartilage

No fracture or dislocation. Normal alignment. No joint effusion.

Severe osteoarthritis of the first MTP joint with bulky marginal
osteophytes and subchondral reactive marrow changes.

Prior amputation of the second phalanx. No periosteal reaction or
bone destruction.

Mild osteoarthritis of the second and third tarsometatarsal joints.

Ligaments

Collateral ligaments are intact.  Lisfranc ligament is intact.

Muscles and Tendons

Flexor, peroneal and extensor compartment tendons are intact.
Generalized muscle atrophy.

Soft tissue
No fluid collection or hematoma. No soft tissue mass. Soft tissue
wound along the plantar aspect of the forefoot. Mild soft tissue
edema along the dorsal aspect of the foot.
IMPRESSION: 1. No osteomyelitis of the right forefoot.
2. Severe osteoarthritis of the first MTP joint.
3. Mild osteoarthritis of the second and third tarsometatarsal
joints.

## 2021-07-12 MED ORDER — GADOBUTROL 1 MMOL/ML IV SOLN
10.0000 mL | Freq: Once | INTRAVENOUS | Status: AC | PRN
  Administered 2021-07-12: 10 mL via INTRAVENOUS

## 2021-07-16 NOTE — Progress Notes (Signed)
KHYRIE, MASI. ([id number]) ?Visit Report for 07/10/2021 ?HPI Details ?Patient Name: Christopher Escobar, NAVES. ?Date of Service: 07/10/2021 2:00 PM ?Medical Record Number: [id number] ?Patient Account Number: [id number] ?Date of Birth/Sex: [birth year] (66 y.o. M) ?Treating RN: Carlene Coria ?Primary Care Provider: Benita Stabile Other Clinician: ?Referring Provider: Benita Stabile ?Treating Provider/Extender: Ricard Dillon ?Weeks in Treatment: 2 ?History of Present Illness ?HPI Description: Admission 06/13/2021 ?[name] is a 66 year old male with a past medical history of insulin-dependent type 2 diabetes with current self-reported hemoglobin ?A1c of 6.8, and peripheral neuropathy that presents to the clinic for a 41-month history of nonhealing ulcer to the right plantar foot. He follows with ?Dr. Posey Pronto, podiatry for this issue. He has been using Betadine wet-to-dry dressings. He is not currently using any offloading device. He denies ?systemic signs of infection. ?06/20/2021; patient presents for follow-up. He obtained his x-ray of the right foot. He has not heard from vein and vascular about setting up an ?appointment for his ABIs. He has been using Hydrofera Blue to the wound bed and using his front offloading shoe. He reports improvement in ?wound healing. ?3/29; patient presents for follow-up. He was originally seen at the [facility] site and has transferred over to the [facility] wound center. I have ?been following him for his diabetic foot wound. He states he received the defender boot and has been using it for the past 3 days without issues. ?He continues to use Hydrofera Blue to the wound bed. He is scheduled for his MRI on 3/31. He denies signs of infection. ?4/5; patient presents for follow-up. He has been using Hydrofera Blue to the wound bed without issues. He denies signs of infection. He has been ?using the Conservation officer, nature. He was scheduled for his MRI on 3/31 however insurance denied coverage. It is unclear  why. The x-ray showed ?suspicious osteo to the second medial met head Where the wound is at. He currently denies systemic signs of infection. He had ABIs completed ?that showed a right ABI of 1.27 and right toe pressure of 0.84 with triphasic waveforms at ATA and PTA. ?4/12; right fourth met head diabetic foot wound. Using [facility] has a Conservation officer, nature. Apparently some suspicion of osteomyelitis noted on ?his plain films but he was denied for an MRI. He states he is being religious with use of the defender boot and offloading his foot ?Electronic Signature(s) ?Signed: 07/11/2021 7:47:50 AM By: Linton Ham MD ?Entered By: Linton Ham on 07/10/2021 14:43:10 ?Christopher, Escobar. ([id number]) ?-------------------------------------------------------------------------------- ?Physical Exam Details ?Patient Name: Christopher, Escobar. ?Date of Service: 07/10/2021 2:00 PM ?Medical Record Number: [id number] ?Patient Account Number: [id number] ?Date of Birth/Sex: Jul 10, 1955 (66 y.o. M) ?Treating RN: Carlene Coria ?Primary Care Provider: Benita Stabile Other Clinician: ?Referring Provider: Benita Stabile ?Treating Provider/Extender: Ricard Dillon ?Weeks in Treatment: 2 ?Constitutional ?Sitting or standing Blood Pressure is within target range for patient.. Pulse regular and within target range for patient.Marland Kitchen Respirations regular, non- ?labored and within target range.. Temperature is normal and within the target range for the patient.Marland Kitchen appears in no distress. ?Notes ?Wound exam; right foot plantar aspect fourth metatarsal head. Small circular area punched-out in appearance. He has roughly 3 mm of ?undermining from roughly 7-12 o'clock. Base of the wound looks healthy. No debridement was necessary. There is no surrounding evidence of ?infection ?Electronic Signature(s) ?Signed: 07/11/2021 7:47:50 AM By: Linton Ham MD ?Entered By: Linton Ham on 07/10/2021 14:44:18 ?Christopher, Escobar.  ([id number]) ?-------------------------------------------------------------------------------- ?Physician Orders Details ?Patient Name: Christopher, [name] ?Date of Service: 07/10/2021 2:00 PM ?Medical Record Number: [id number] ?Patient Account Number: [id number] ?Date of Birth/Sex: 12-22-1955 (66 y.o. M) ?Treating RN: Carlene Coria ?Primary Care Provider: Benita Stabile Other Clinician: ?Referring Provider: Benita Stabile ?Treating Provider/Extender: Ricard Dillon ?Weeks in Treatment: 2 ?Verbal / Phone Orders: No ?Diagnosis Coding ?Follow-up Appointments ?o Return Appointment in 1 week. ?Bathing/ Shower/ Hygiene ?o May shower; gently cleanse wound with antibacterial soap, rinse and pat dry prior to dressing wounds - keep dressing dry or ?change after shower ?o No tub bath. ?Anesthetic (Use 'Patient Medications' Section for Anesthetic Order Entry) ?o Lidocaine applied to wound bed ?Edema Control - Lymphedema / Segmental Compressive Device / Other ?o Elevate leg(s) parallel to the floor when sitting. ?o DO YOUR BEST to sleep in the bed at night. DO NOT sleep in your recliner. Long hours of sitting in a recliner leads to ?swelling of the legs and/or potential wounds on your backside. ?Off-Loading ?o Foot Defender o - right foot-keep shin covered for protection ?o Turn and reposition every 2 hours ?Additional Orders / Instructions ?o Follow Nutritious Diet and Increase Protein Intake - monitor blood sugar to maintain normal level ?Wound Treatment ?Wound #1 - Foot Wound Laterality: Plantar, Right ?Cleanser: Soap and Water 1 x Per Day/30 Days ?Discharge Instructions: Gently cleanse wound with antibacterial soap, rinse and pat dry prior to dressing wounds ?Cleanser: Wound Cleanser 1 x Per Day/30 Days ?Discharge Instructions: Wash your hands with soap and water. Remove old dressing, discard into plastic bag and place into trash. ?Cleanse the wound with Wound Cleanser prior to applying a clean dressing using gauze  sponges, not tissues or cotton balls. Do not ?scrub or use excessive force. Pat dry using gauze sponges, not tissue or cotton balls. ?Primary Dressing: Hydrofera Blue Ready Transfer Foam, 2.5x2.5 (in/in) 1 x Per Day/30 Days ?Discharge Instructions: Apply Hydrofera Blue Ready to wound bed as directed ?Secondary Dressing: ABD Pad 5x9 (in/in) 1 x Per Day/30 Days ?Discharge Instructions: Cover with ABD pad ?Secured With: Medipore Tape - 95M Medipore H Soft Cloth Surgical Tape, 2x2 (in/yd) 1 x Per Day/30 Days ?Secured With: [employer] or Non-Sterile 6-ply 4.5x4 (yd/yd) 1 x Per Day/30 Days ?Discharge Instructions: Apply Kerlix as directed ?Electronic Signature(s) ?Signed: 07/11/2021 7:47:50 AM By: Linton Ham MD ?Signed: 07/16/2021 9:16:22 AM By: Carlene Coria RN ?Entered By: Carlene Coria on 07/10/2021 14:27:07 ?Escobar, STOCKS. ([id number]) ?-------------------------------------------------------------------------------- ?Problem List Details ?Patient Name: YAMAN, GRAUBERGER. ?Date of Service: 07/10/2021 2:00 PM ?Medical Record Number: [id number] ?Patient Account Number: [id number] ?Date of Birth/Sex: 1955-05-25 (66 y.o. M) ?Treating RN: Carlene Coria ?Primary Care Provider: Benita Stabile Other Clinician: ?Referring Provider: Benita Stabile ?Treating Provider/Extender: Ricard Dillon ?Weeks in Treatment: 2 ?Active Problems ?ICD-10 ?Encounter ?Code Description Active Date MDM ?Diagnosis ?E11.621 Type 2 diabetes mellitus with foot ulcer 06/26/2021 No Yes ?N86.767 Non-pressure chronic ulcer of other part of right foot with fat layer 06/26/2021 No Yes ?exposed ?R26.89 Other abnormalities of gait and mobility 06/26/2021 No Yes ?Inactive Problems ?Resolved Problems ?Electronic Signature(s) ?Signed: 07/11/2021 7:47:50 AM By: Linton Ham MD ?Entered By: Linton Ham on 07/10/2021 14:39:34 ?ARDIS, FULLWOOD. ([id number]) ?-------------------------------------------------------------------------------- ?Progress Note  Details ?Patient Name: CORBAN, Christopher Escobar. ?Date of Service: 07/10/2021 2:00 PM ?Medical Record Number: [id number] ?Patient Account Number: [id number] ?Date of Birth/Sex: [birth year] (66 y.o. M) ?Treating RN: Carlene Coria ?Primary Care Provider: Hall Busing,

## 2021-07-16 NOTE — Progress Notes (Signed)
KHAMBREL, AMSDEN. ([id number]) ?Visit Report for 07/10/2021 ?Arrival Information Details ?Patient Name: Christopher Escobar, Christopher Escobar. ?Date of Service: 07/10/2021 2:00 PM ?Medical Record Number: [id number] ?Patient Account Number: [id number] ?Date of Birth/Sex: 1956-01-20 (66 y.o. M) ?Treating RN: Carlene Coria ?Primary Care Moira Umholtz: Benita Stabile Other Clinician: ?Referring Josephyne Tarter: Benita Stabile ?Treating Josephine Wooldridge/Extender: Ricard Dillon ?Weeks in Treatment: 2 ?Visit Information History Since Last Visit ?All ordered tests and consults were completed: No ?Patient Arrived: Ambulatory ?Added or deleted any medications: No ?Arrival Time: 14:00 ?Any new allergies or adverse reactions: No ?Accompanied By: self ?Had a fall or experienced change in No ?Transfer Assistance: None ?activities of daily living that may affect ?Patient Identification Verified: Yes ?risk of falls: ?Secondary Verification Process Completed: Yes ?Signs or symptoms of abuse/neglect since last visito No ?Patient Requires Transmission-Based No ?Hospitalized since last visit: No ?Precautions: ?Implantable device outside of the clinic excluding No ?Patient Has Alerts: Yes ?cellular tissue based products placed in the center ?Patient Alerts: DIABETIC ?since last visit: ?AVVS ABI/TBI ?Has Dressing in Place as Prescribed: Yes ?07/03/21 ?Has Footwear/Offloading in Place as Prescribed: Yes ?R 1.27; L 1.23 ?Left: Other:foot defender ?Pain Present Now: No ?Electronic Signature(s) ?Signed: 07/16/2021 9:16:22 AM By: Carlene Coria RN ?Entered By: Carlene Coria on 07/10/2021 14:06:52 ?Christopher Escobar, Christopher Escobar. ([id number]) ?-------------------------------------------------------------------------------- ?Clinic Level of Care Assessment Details ?Patient Name: Christopher Escobar, Christopher Escobar. ?Date of Service: 07/10/2021 2:00 PM ?Medical Record Number: [id number] ?Patient Account Number: [id number] ?Date of Birth/Sex: [birth year] (66 y.o. M) ?Treating RN: Carlene Coria ?Primary Care Bessye Stith: Benita Stabile Other  Clinician: ?Referring Alaysha Jefcoat: Benita Stabile ?Treating Terisa Belardo/Extender: Ricard Dillon ?Weeks in Treatment: 2 ?Clinic Level of Care Assessment Items ?TOOL 4 Quantity Score ?X - Use when only an EandM is performed on FOLLOW-UP visit 1 0 ?ASSESSMENTS - Nursing Assessment / Reassessment ?X - Reassessment of Co-morbidities (includes updates in patient status) 1 10 ?X- 1 5 ?Reassessment of Adherence to Treatment Plan ?ASSESSMENTS - Wound and Skin Assessment / Reassessment ?X - Simple Wound Assessment / Reassessment - one wound 1 5 ?'[]'$  - 0 ?Complex Wound Assessment / Reassessment - multiple wounds ?'[]'$  - 0 ?Dermatologic / Skin Assessment (not related to wound area) ?ASSESSMENTS - Focused Assessment ?'[]'$  - Circumferential Edema Measurements - multi extremities 0 ?'[]'$  - 0 ?Nutritional Assessment / Counseling / Intervention ?'[]'$  - 0 ?Lower Extremity Assessment (monofilament, tuning fork, pulses) ?'[]'$  - 0 ?Peripheral Arterial Disease Assessment (using hand held doppler) ?ASSESSMENTS - Ostomy and/or Continence Assessment and Care ?'[]'$  - Incontinence Assessment and Management 0 ?'[]'$  - 0 ?Ostomy Care Assessment and Management (repouching, etc.) ?PROCESS - Coordination of Care ?X - Simple Patient / Family Education for ongoing care 1 15 ?'[]'$  - 0 ?Complex (extensive) Patient / Family Education for ongoing care ?'[]'$  - 0 ?Staff obtains Consents, Records, Test Results / Process Orders ?'[]'$  - 0 ?Staff telephones HHA, Nursing Homes / Clarify orders / etc ?'[]'$  - 0 ?Routine Transfer to another Facility (non-emergent condition) ?'[]'$  - 0 ?Routine Hospital Admission (non-emergent condition) ?'[]'$  - 0 ?New Admissions / Biomedical engineer / Ordering NPWT, Apligraf, etc. ?'[]'$  - 0 ?Emergency Hospital Admission (emergent condition) ?X- 1 10 ?Simple Discharge Coordination ?'[]'$  - 0 ?Complex (extensive) Discharge Coordination ?PROCESS - Special Needs ?'[]'$  - Pediatric / Minor Patient Management 0 ?'[]'$  - 0 ?Isolation Patient Management ?'[]'$  - 0 ?Hearing /  Language / Visual special needs ?'[]'$  - 0 ?Assessment of Community assistance (transportation, D/C planning, etc.) ?'[]'$  - 0 ?Additional assistance / Altered mentation ?'[]'$  - 0 ?Support Surface(s)  Assessment (bed, cushion, seat, etc.) ?INTERVENTIONS - Wound Cleansing / Measurement ?Christopher Escobar, Christopher Escobar. ([id number]) ?X- 1 5 ?Simple Wound Cleansing - one wound ?'[]'$  - 0 ?Complex Wound Cleansing - multiple wounds ?X- 1 5 ?Wound Imaging (photographs - any number of wounds) ?'[]'$  - 0 ?Wound Tracing (instead of photographs) ?X- 1 5 ?Simple Wound Measurement - one wound ?'[]'$  - 0 ?Complex Wound Measurement - multiple wounds ?INTERVENTIONS - Wound Dressings ?X - Small Wound Dressing one or multiple wounds 1 10 ?'[]'$  - 0 ?Medium Wound Dressing one or multiple wounds ?'[]'$  - 0 ?Large Wound Dressing one or multiple wounds ?'[]'$  - 0 ?Application of Medications - topical ?'[]'$  - 0 ?Application of Medications - injection ?INTERVENTIONS - Miscellaneous ?'[]'$  - External ear exam 0 ?'[]'$  - 0 ?Specimen Collection (cultures, biopsies, blood, body fluids, etc.) ?'[]'$  - 0 ?Specimen(s) / Culture(s) sent or taken to Lab for analysis ?'[]'$  - 0 ?Patient Transfer (multiple staff / Civil Service fast streamer / Similar devices) ?'[]'$  - 0 ?Simple Staple / Suture removal (25 or less) ?'[]'$  - 0 ?Complex Staple / Suture removal (26 or more) ?'[]'$  - 0 ?Hypo / Hyperglycemic Management (close monitor of Blood Glucose) ?'[]'$  - 0 ?Ankle / Brachial Index (ABI) - do not check if billed separately ?X- 1 5 ?Vital Signs ?Has the patient been seen at the hospital within the last three years: Yes ?Total Score: 75 ?Level Of Care: New/Established - Level ?2 ?Electronic Signature(s) ?Signed: 07/16/2021 9:16:22 AM By: Carlene Coria RN ?Entered By: Carlene Coria on 07/10/2021 14:27:33 ?Christopher Escobar, Christopher Escobar. ([id number]) ?-------------------------------------------------------------------------------- ?Encounter Discharge Information Details ?Patient Name: Christopher Escobar, Christopher Escobar. ?Date of Service: 07/10/2021 2:00 PM ?Medical  Record Number: [id number] ?Patient Account Number: [id number] ?Date of Birth/Sex: 23-Apr-1955 (66 y.o. M) ?Treating RN: Carlene Coria ?Primary Care Zaylan Kissoon: Benita Stabile Other Clinician: ?Referring Skylarr Liz: Benita Stabile ?Treating Dushaun Okey/Extender: Ricard Dillon ?Weeks in Treatment: 2 ?Encounter Discharge Information Items ?Discharge Condition: Stable ?Ambulatory Status: Ambulatory ?Discharge Destination: Home ?Transportation: Private Auto ?Accompanied By: self ?Schedule Follow-up Appointment: Yes ?Clinical Summary of Care: Patient Declined ?Electronic Signature(s) ?Signed: 07/16/2021 9:16:22 AM By: Carlene Coria RN ?Entered By: Carlene Coria on 07/10/2021 14:28:41 ?Christopher Escobar, Christopher Escobar. ([id number]) ?-------------------------------------------------------------------------------- ?Lower Extremity Assessment Details ?Patient Name: Christopher Escobar, Christopher Escobar. ?Date of Service: 07/10/2021 2:00 PM ?Medical Record Number: [id number] ?Patient Account Number: [id number] ?Date of Birth/Sex: 08/17/1955 (66 y.o. M) ?Treating RN: Carlene Coria ?Primary Care Makynzi Eastland: Benita Stabile Other Clinician: ?Referring Kaylyn Garrow: Benita Stabile ?Treating Rose-Marie Hickling/Extender: Ricard Dillon ?Weeks in Treatment: 2 ?Edema Assessment ?Assessed: [Left: No] [Right: No] ?Edema: [Left: N] [Right: o] ?Calf ?Left: Right: ?Point of Measurement: 36 cm From Medial Instep 39 cm ?Ankle ?Left: Right: ?Point of Measurement: 11 cm From Medial Instep 21 cm ?Vascular Assessment ?Pulses: ?Dorsalis Pedis ?Palpable: [Right:Yes] ?Electronic Signature(s) ?Signed: 07/16/2021 9:16:22 AM By: Carlene Coria RN ?Entered By: Carlene Coria on 07/10/2021 14:11:08 ?Christopher Escobar, Christopher Escobar. ([id number]) ?-------------------------------------------------------------------------------- ?Multi Wound Chart Details ?Patient Name: Christopher Escobar, Christopher Escobar. ?Date of Service: 07/10/2021 2:00 PM ?Medical Record Number: [id number] ?Patient Account Number: [id number] ?Date of Birth/Sex: Nov 24, 1955 (66 y.o. M) ?Treating RN: Carlene Coria ?Primary Care Aarav Burgett: Benita Stabile Other Clinician: ?Referring Candi Profit: Benita Stabile ?Treating Casie Sturgeon/Extender: Ricard Dillon ?Weeks in Treatment: 2 ?Vital Signs ?Height(in): 71 ?Pulse(bpm): 75 ?

## 2021-07-17 ENCOUNTER — Encounter (HOSPITAL_BASED_OUTPATIENT_CLINIC_OR_DEPARTMENT_OTHER): Payer: Medicare HMO | Admitting: Internal Medicine

## 2021-07-17 DIAGNOSIS — L97512 Non-pressure chronic ulcer of other part of right foot with fat layer exposed: Secondary | ICD-10-CM | POA: Diagnosis not present

## 2021-07-17 DIAGNOSIS — R2689 Other abnormalities of gait and mobility: Secondary | ICD-10-CM | POA: Diagnosis not present

## 2021-07-17 DIAGNOSIS — E11621 Type 2 diabetes mellitus with foot ulcer: Secondary | ICD-10-CM

## 2021-07-17 NOTE — Progress Notes (Signed)
SHAHRUKH, PASCH. ([id number]) ?Visit Report for 07/17/2021 ?Chief Complaint Document Details ?Patient Name: Christopher Escobar, Christopher Escobar. ?Date of Service: 07/17/2021 1:00 PM ?Medical Record Number: [id number] ?Patient Account Number: [id number] ?Date of Birth/Sex: 07/29/1955 (66 y.o. M) ?Treating RN: Donnamarie Poag ?Primary Care Provider: Benita Stabile Other Clinician: ?Referring Provider: Benita Stabile ?Treating Provider/Extender: Kalman Shan ?Weeks in Treatment: 3 ?Information Obtained from: Patient ?Chief Complaint ?06/13/2021; Right plantar diabetic foot wound ?Electronic Signature(s) ?Signed: 07/17/2021 2:50:10 PM By: Kalman Shan DO ?Entered By: Kalman Shan on 07/17/2021 14:36:40 ?Christopher Escobar, Christopher Escobar. ([id number]) ?-------------------------------------------------------------------------------- ?HPI Details ?Patient Name: Christopher Escobar, Christopher Escobar. ?Date of Service: 07/17/2021 1:00 PM ?Medical Record Number: [id number] ?Patient Account Number: [id number] ?Date of Birth/Sex: [birth year] (66 y.o. M) ?Treating RN: Donnamarie Poag ?Primary Care Provider: Benita Stabile Other Clinician: ?Referring Provider: Benita Stabile ?Treating Provider/Extender: Kalman Shan ?Weeks in Treatment: 3 ?History of Present Illness ?HPI Description: Admission 06/13/2021 ?[name] is a 66 year old male with a past medical history of insulin-dependent type 2 diabetes with current self-reported hemoglobin ?A1c of 6.8, and peripheral neuropathy that presents to the clinic for a 70-month history of nonhealing ulcer to the right plantar foot. He follows with ?Dr. Posey Escobar, podiatry for this issue. He has been using Betadine wet-to-dry dressings. He is not currently using any offloading device. He denies ?systemic signs of infection. ?06/20/2021; patient presents for follow-up. He obtained his x-ray of the right foot. He has not heard from vein and vascular about setting up an ?appointment for his ABIs. He has been using Hydrofera Blue to the wound bed and using his  front offloading shoe. He reports improvement in ?wound healing. ?3/29; patient presents for follow-up. He was originally seen at the [facility] site and has transferred over to the [facility] wound center. I have ?been following him for his diabetic foot wound. He states he received the defender boot and has been using it for the past 3 days without issues. ?He continues to use Hydrofera Blue to the wound bed. He is scheduled for his MRI on 3/31. He denies signs of infection. ?4/5; patient presents for follow-up. He has been using Hydrofera Blue to the wound bed without issues. He denies signs of infection. He has been ?using the Conservation officer, nature. He was scheduled for his MRI on 3/31 however insurance denied coverage. It is unclear why. The x-ray showed ?suspicious osteo to the second medial met head Where the wound is at. He currently denies systemic signs of infection. He had ABIs completed ?that showed a right ABI of 1.27 and right toe pressure of 0.84 with triphasic waveforms at ATA and PTA. ?4/12; right fourth met head diabetic foot wound. Using [facility] has a Conservation officer, nature. Apparently some suspicion of osteomyelitis noted on ?his plain films but he was denied for an MRI. He states he is being religious with use of the defender boot and offloading his foot ?4/19; patient presents for follow-up. He has been using Hydrofera Blue with the Conservation officer, nature. He had his MRI completed. He currently denies ?signs of infection. ?Electronic Signature(s) ?Signed: 07/17/2021 2:50:10 PM By: Kalman Shan DO ?Entered By: Kalman Shan on 07/17/2021 14:37:24 ?Christopher Escobar, Christopher Escobar. ([id number]) ?-------------------------------------------------------------------------------- ?Physical Exam Details ?Patient Name: Christopher Escobar, Christopher Escobar. ?Date of Service: 07/17/2021 1:00 PM ?Medical Record Number: [id number] ?Patient Account Number: [id number] ?Date of Birth/Sex: [birth year] (66 y.o. M) ?Treating RN: Donnamarie Poag ?Primary Care Provider:  Benita Stabile Other Clinician: ?Referring Provider: Benita Stabile ?Treating Provider/Extender: Kalman Shan ?Weeks in Treatment: 3 ?Constitutional ?. ?Cardiovascular ?Marland Kitchen ?Psychiatric ?Marland Kitchen ?  Notes ?Right lower extremity: To the right plantar fourth metatarsal head there is a small circular punched-out wound with granulation tissue. No signs of ?surrounding infection. ?Electronic Signature(s) ?Signed: 07/17/2021 2:50:10 PM By: Kalman Shan DO ?Entered By: Kalman Shan on 07/17/2021 14:38:16 ?Christopher Escobar, Christopher Escobar. ([id number]) ?-------------------------------------------------------------------------------- ?Physician Orders Details ?Patient Name: Christopher Escobar, Christopher Escobar. ?Date of Service: 07/17/2021 1:00 PM ?Medical Record Number: [id number] ?Patient Account Number: [id number] ?Date of Birth/Sex: [birth year] (66 y.o. M) ?Treating RN: Levora Dredge ?Primary Care Provider: Benita Stabile Other Clinician: ?Referring Provider: Benita Stabile ?Treating Provider/Extender: Kalman Shan ?Weeks in Treatment: 3 ?Verbal / Phone Orders: No ?Diagnosis Coding ?Follow-up Appointments ?o Return Appointment in 1 week. ?Bathing/ Shower/ Hygiene ?o May shower; gently cleanse wound with antibacterial soap, rinse and pat dry prior to dressing wounds - keep dressing dry or ?change after shower ?o No tub bath. ?Anesthetic (Use 'Patient Medications' Section for Anesthetic Order Entry) ?o Lidocaine applied to wound bed ?Edema Control - Lymphedema / Segmental Compressive Device / Other ?o Elevate leg(s) parallel to the floor when sitting. ?o DO YOUR BEST to sleep in the bed at night. DO NOT sleep in your recliner. Long hours of sitting in a recliner leads to ?swelling of the legs and/or potential wounds on your backside. ?Off-Loading ?o Foot Defender o - right foot-keep shin covered for protection ?o Turn and reposition every 2 hours ?Additional Orders / Instructions ?o Follow Nutritious Diet and Increase Protein Intake - monitor blood  sugar to maintain normal level ?Wound Treatment ?Wound #1 - Foot Wound Laterality: Plantar, Right ?Cleanser: Soap and Water 1 x Per Day/30 Days ?Discharge Instructions: Gently cleanse wound with antibacterial soap, rinse and pat dry prior to dressing wounds ?Cleanser: Wound Cleanser 1 x Per Day/30 Days ?Discharge Instructions: Wash your hands with soap and water. Remove old dressing, discard into plastic bag and place into trash. ?Cleanse the wound with Wound Cleanser prior to applying a clean dressing using gauze sponges, not tissues or cotton balls. Do not ?scrub or use excessive force. Pat dry using gauze sponges, not tissue or cotton balls. ?Primary Dressing: Hydrofera Blue Ready Transfer Foam, 2.5x2.5 (in/in) 1 x Per Day/30 Days ?Discharge Instructions: Apply Hydrofera Blue Ready to wound bed as directed ?Secondary Dressing: ABD Pad 5x9 (in/in) 1 x Per Day/30 Days ?Discharge Instructions: Cover with ABD pad ?Secured With: Medipore Tape - 47M Medipore H Soft Cloth Surgical Tape, 2x2 (in/yd) 1 x Per Day/30 Days ?Secured With: [employer] or Non-Sterile 6-ply 4.5x4 (yd/yd) 1 x Per Day/30 Days ?Discharge Instructions: Apply Kerlix as directed ?Electronic Signature(s) ?Signed: 07/17/2021 2:50:10 PM By: Kalman Shan DO ?Entered By: Kalman Shan on 07/17/2021 14:41:45 ?Christopher Escobar, Christopher Escobar. ([id number]) ?-------------------------------------------------------------------------------- ?Problem List Details ?Patient Name: Christopher Escobar, Christopher Escobar. ?Date of Service: 07/17/2021 1:00 PM ?Medical Record Number: [id number] ?Patient Account Number: [id number] ?Date of Birth/Sex: 1955-10-22 (66 y.o. M) ?Treating RN: Donnamarie Poag ?Primary Care Provider: Benita Stabile Other Clinician: ?Referring Provider: Benita Stabile ?Treating Provider/Extender: Kalman Shan ?Weeks in Treatment: 3 ?Active Problems ?ICD-10 ?Encounter ?Code Description Active Date MDM ?Diagnosis ?E11.621 Type 2 diabetes mellitus with foot ulcer 06/26/2021 No  Yes ?J03.159 Non-pressure chronic ulcer of other part of right foot with fat layer 06/26/2021 No Yes ?exposed ?R26.89 Other abnormalities of gait and mobility 06/26/2021 No Yes ?Inactive Problems ?Resolved Probl

## 2021-07-17 NOTE — Progress Notes (Signed)
LADD, CEN. ([id number]) ?Visit Report for 07/17/2021 ?Arrival Information Details ?Patient Name: Christopher Escobar, Christopher Escobar. ?Date of Service: 07/17/2021 1:00 PM ?Medical Record Number: [id number] ?Patient Account Number: [id number] ?Date of Birth/Sex: [birth year] (66 y.o. M) ?Treating RN: Levora Dredge ?Primary Care Dorthia Tout: Benita Stabile Other Clinician: ?Referring Connelly [place]: Benita Stabile ?Treating [place] Mcphee/Extender: Kalman Shan ?Weeks in Treatment: 3 ?Visit Information History Since Last Visit ?Added or deleted any medications: No ?Patient Arrived: Ambulatory ?Any new allergies or adverse reactions: No ?Arrival Time: 13:00 ?Had a fall or experienced change in No ?Accompanied By: self ?activities of daily living that may affect ?Transfer Assistance: None ?risk of falls: ?Patient Identification Verified: Yes ?Hospitalized since last visit: No ?Secondary Verification Process Completed: Yes ?Has Dressing in Place as Prescribed: Yes ?Patient Requires Transmission-Based No ?Has Footwear/Offloading in Place as Prescribed: Yes ?Precautions: ?Right: Other:defender boot ?Patient Has Alerts: Yes ?Pain Present Now: No ?Patient Alerts: DIABETIC ?AVVS ABI/TBI ?07/03/21 ?R 1.27; L 1.23 ?Electronic Signature(s) ?Signed: 07/17/2021 3:51:42 PM By: Levora Dredge ?Entered By: Levora Dredge on 07/17/2021 13:01:00 ?NICOLAAS, SAVO. ([id number]) ?-------------------------------------------------------------------------------- ?Clinic Level of Care Assessment Details ?Patient Name: Christopher Escobar. ?Date of Service: 07/17/2021 1:00 PM ?Medical Record Number: [id number] ?Patient Account Number: [id number] ?Date of Birth/Sex: 19-Jan-1956 (66 y.o. M) ?Treating RN: Levora Dredge ?Primary Care Camerin Ladouceur: Benita Stabile Other Clinician: ?Referring Maxum Cassarino: Benita Stabile ?Treating Valkyrie Guardiola/Extender: Kalman Shan ?Weeks in Treatment: 3 ?Clinic Level of Care Assessment Items ?TOOL 4 Quantity Score ?'[]'$  - Use when only an EandM is performed on  FOLLOW-UP visit 0 ?ASSESSMENTS - Nursing Assessment / Reassessment ?X - Reassessment of Co-morbidities (includes updates in patient status) 1 10 ?X- 1 5 ?Reassessment of Adherence to Treatment Plan ?ASSESSMENTS - Wound and Skin Assessment / Reassessment ?X - Simple Wound Assessment / Reassessment - one wound 1 5 ?'[]'$  - 0 ?Complex Wound Assessment / Reassessment - multiple wounds ?'[]'$  - 0 ?Dermatologic / Skin Assessment (not related to wound area) ?ASSESSMENTS - Focused Assessment ?X - Circumferential Edema Measurements - multi extremities 1 5 ?'[]'$  - 0 ?Nutritional Assessment / Counseling / Intervention ?'[]'$  - 0 ?Lower Extremity Assessment (monofilament, tuning fork, pulses) ?'[]'$  - 0 ?Peripheral Arterial Disease Assessment (using hand held doppler) ?ASSESSMENTS - Ostomy and/or Continence Assessment and Care ?'[]'$  - Incontinence Assessment and Management 0 ?'[]'$  - 0 ?Ostomy Care Assessment and Management (repouching, etc.) ?PROCESS - Coordination of Care ?X - Simple Patient / Family Education for ongoing care 1 15 ?'[]'$  - 0 ?Complex (extensive) Patient / Family Education for ongoing care ?'[]'$  - 0 ?Staff obtains Consents, Records, Test Results / Process Orders ?'[]'$  - 0 ?Staff telephones HHA, Nursing Homes / Clarify orders / etc ?'[]'$  - 0 ?Routine Transfer to another Facility (non-emergent condition) ?'[]'$  - 0 ?Routine Hospital Admission (non-emergent condition) ?'[]'$  - 0 ?New Admissions / Biomedical engineer / Ordering NPWT, Apligraf, etc. ?'[]'$  - 0 ?Emergency Hospital Admission (emergent condition) ?X- 1 10 ?Simple Discharge Coordination ?'[]'$  - 0 ?Complex (extensive) Discharge Coordination ?PROCESS - Special Needs ?'[]'$  - Pediatric / Minor Patient Management 0 ?'[]'$  - 0 ?Isolation Patient Management ?'[]'$  - 0 ?Hearing / Language / Visual special needs ?'[]'$  - 0 ?Assessment of Community assistance (transportation, D/C planning, etc.) ?'[]'$  - 0 ?Additional assistance / Altered mentation ?'[]'$  - 0 ?Support Surface(s) Assessment (bed, cushion, seat,  etc.) ?INTERVENTIONS - Wound Cleansing / Measurement ?AVEL, OGAWA. ([id number]) ?X- 1 5 ?Simple Wound Cleansing - one wound ?'[]'$  - 0 ?Complex Wound Cleansing - multiple wounds ?X- 1 5 ?Wound Imaging (photographs -  any number of wounds) ?'[]'$  - 0 ?Wound Tracing (instead of photographs) ?X- 1 5 ?Simple Wound Measurement - one wound ?'[]'$  - 0 ?Complex Wound Measurement - multiple wounds ?INTERVENTIONS - Wound Dressings ?X - Small Wound Dressing one or multiple wounds 1 10 ?'[]'$  - 0 ?Medium Wound Dressing one or multiple wounds ?'[]'$  - 0 ?Large Wound Dressing one or multiple wounds ?X- 1 5 ?Application of Medications - topical ?'[]'$  - 0 ?Application of Medications - injection ?INTERVENTIONS - Miscellaneous ?'[]'$  - External ear exam 0 ?'[]'$  - 0 ?Specimen Collection (cultures, biopsies, blood, body fluids, etc.) ?'[]'$  - 0 ?Specimen(s) / Culture(s) sent or taken to Lab for analysis ?'[]'$  - 0 ?Patient Transfer (multiple staff / Civil Service fast streamer / Similar devices) ?'[]'$  - 0 ?Simple Staple / Suture removal (25 or less) ?'[]'$  - 0 ?Complex Staple / Suture removal (26 or more) ?'[]'$  - 0 ?Hypo / Hyperglycemic Management (close monitor of Blood Glucose) ?'[]'$  - 0 ?Ankle / Brachial Index (ABI) - do not check if billed separately ?X- 1 5 ?Vital Signs ?Has the patient been seen at the hospital within the last three years: Yes ?Total Score: 85 ?Level Of Care: New/Established - Level ?3 ?Electronic Signature(s) ?Signed: 07/17/2021 3:51:42 PM By: Levora Dredge ?Entered By: Levora Dredge on 07/17/2021 13:35:45 ?REID, REGAS. ([id number]) ?-------------------------------------------------------------------------------- ?Encounter Discharge Information Details ?Patient Name: Escobar, [name]. ?Date of Service: 07/17/2021 1:00 PM ?Medical Record Number: [id number] ?Patient Account Number: [id number] ?Date of Birth/Sex: 11/16/1955 (66 y.o. M) ?Treating RN: Levora Dredge ?Primary Care Kinzy Weyers: Benita Stabile Other Clinician: ?Referring Rmoni Keplinger: Benita Stabile ?Treating Thelia Tanksley/Extender: Kalman Shan ?Weeks in Treatment: 3 ?Encounter Discharge Information Items ?Discharge Condition: Stable ?Ambulatory Status: Ambulatory ?Discharge Destination: Home ?Transportation: Private Auto ?Accompanied By: self ?Schedule Follow-up Appointment: Yes ?Clinical Summary of Care: Patient Declined ?Electronic Signature(s) ?Signed: 07/17/2021 3:51:42 PM By: Levora Dredge ?Entered By: Levora Dredge on 07/17/2021 13:37:39 ?EIAN, VANDERVELDEN. ([id number]) ?-------------------------------------------------------------------------------- ?Lower Extremity Assessment Details ?Patient Name: KAMARION, ZAGAMI. ?Date of Service: 07/17/2021 1:00 PM ?Medical Record Number: [id number] ?Patient Account Number: [id number] ?Date of Birth/Sex: [birth year] (66 y.o. M) ?Treating RN: Levora Dredge ?Primary Care Warden Buffa: Benita Stabile Other Clinician: ?Referring Jahrel Borthwick: Benita Stabile ?Treating Santosh Petter/Extender: Kalman Shan ?Weeks in Treatment: 3 ?Edema Assessment ?Assessed: [Left: No] [Right: No] ?Edema: [Left: N] [Right: o] ?Calf ?Left: Right: ?Point of Measurement: 36 cm From Medial Instep 39.2 cm ?Ankle ?Left: Right: ?Point of Measurement: 11 cm From Medial Instep 22 cm ?Vascular Assessment ?Pulses: ?Dorsalis Pedis ?Palpable: [Right:Yes] ?Electronic Signature(s) ?Signed: 07/17/2021 3:51:42 PM By: Levora Dredge ?Entered By: Levora Dredge on 07/17/2021 13:08:12 ?CALI, CUARTAS. ([id number]) ?-------------------------------------------------------------------------------- ?Multi Wound Chart Details ?Patient Name: QUINDARIUS, CABELLO. ?Date of Service: 07/17/2021 1:00 PM ?Medical Record Number: [id number] ?Patient Account Number: [id number] ?Date of Birth/Sex: 03/28/1956 (66 y.o. M) ?Treating RN: Levora Dredge ?Primary Care Roderick Calo: Benita Stabile Other Clinician: ?Referring Keydi Giel: Benita Stabile ?Treating Delmus Warwick/Extender: Kalman Shan ?Weeks in Treatment: 3 ?Vital Signs ?Height(in):  71 ?Pulse(bpm): 77 ?Weight(lbs): 254 ?Blood Pressure(mmHg): 98/66 ?Body Mass Index(BMI): 35.4 ?Temperature(??F): 97.6 ?Respiratory Rate(breaths/min): 18 ?Photos: [N/A:N/A] ?Wound Location: Right, Plantar Foot N/A N/A ?Wounding Eve

## 2021-07-24 ENCOUNTER — Encounter (HOSPITAL_BASED_OUTPATIENT_CLINIC_OR_DEPARTMENT_OTHER): Payer: Medicare HMO | Admitting: Internal Medicine

## 2021-07-24 DIAGNOSIS — L97512 Non-pressure chronic ulcer of other part of right foot with fat layer exposed: Secondary | ICD-10-CM

## 2021-07-24 DIAGNOSIS — R2689 Other abnormalities of gait and mobility: Secondary | ICD-10-CM

## 2021-07-24 DIAGNOSIS — E11621 Type 2 diabetes mellitus with foot ulcer: Secondary | ICD-10-CM

## 2021-07-24 NOTE — Progress Notes (Signed)
Christopher Escobar, Christopher Escobar. ([id number]) ?Visit Report for 07/24/2021 ?Arrival Information Details ?Patient Name: Christopher Escobar, Christopher Escobar. ?Date of Service: 07/24/2021 1:00 PM ?Medical Record Number: [id number] ?Patient Account Number: [id number] ?Date of Birth/Sex: [birth year] (66 y.o. M) ?Treating RN: Donnamarie Poag ?Primary Care Cobey Raineri: Benita Stabile Other Clinician: ?Referring Kymoni Monday: Benita Stabile ?Treating Hallel Denherder/Extender: Kalman Shan ?Weeks in Treatment: 4 ?Visit Information History Since Last Visit ?Added or deleted any medications: No ?Patient Arrived: Ambulatory ?Had a fall or experienced change in No ?Arrival Time: 13:00 ?activities of daily living that may affect ?Accompanied By: self ?risk of falls: ?Transfer Assistance: None ?Hospitalized since last visit: No ?Patient Identification Verified: Yes ?Has Dressing in Place as Prescribed: Yes ?Secondary Verification Process Completed: Yes ?Has Footwear/Offloading in Place as Prescribed: Yes ?Patient Requires Transmission-Based No ?Right: Other:foot defender boot ?Precautions: ?Pain Present Now: No ?Patient Has Alerts: Yes ?Patient Alerts: DIABETIC ?AVVS ABI/TBI ?07/03/21 ?R 1.27; L 1.23 ?Electronic Signature(s) ?Signed: 07/24/2021 4:04:00 PM By: Donnamarie Poag ?Entered ByDonnamarie Poag on 07/24/2021 13:04:37 ?Christopher Escobar, Christopher Escobar. ([id number]) ?-------------------------------------------------------------------------------- ?Clinic Level of Care Assessment Details ?Patient Name: Christopher Escobar, Christopher Escobar. ?Date of Service: 07/24/2021 1:00 PM ?Medical Record Number: [id number] ?Patient Account Number: [id number] ?Date of Birth/Sex: [birth year] (66 y.o. M) ?Treating RN: Donnamarie Poag ?Primary Care Herley Bernardini: Benita Stabile Other Clinician: ?Referring Daizha Anand: Benita Stabile ?Treating Drina Jobst/Extender: Kalman Shan ?Weeks in Treatment: 4 ?Clinic Level of Care Assessment Items ?TOOL 4 Quantity Score ?'[]'$  - Use when only an EandM is performed on FOLLOW-UP visit 0 ?ASSESSMENTS - Nursing Assessment /  Reassessment ?'[]'$  - Reassessment of Co-morbidities (includes updates in patient status) 0 ?'[]'$  - 0 ?Reassessment of Adherence to Treatment Plan ?ASSESSMENTS - Wound and Skin Assessment / Reassessment ?X - Simple Wound Assessment / Reassessment - one wound 1 5 ?'[]'$  - 0 ?Complex Wound Assessment / Reassessment - multiple wounds ?'[]'$  - 0 ?Dermatologic / Skin Assessment (not related to wound area) ?ASSESSMENTS - Focused Assessment ?'[]'$  - Circumferential Edema Measurements - multi extremities 0 ?'[]'$  - 0 ?Nutritional Assessment / Counseling / Intervention ?'[]'$  - 0 ?Lower Extremity Assessment (monofilament, tuning fork, pulses) ?'[]'$  - 0 ?Peripheral Arterial Disease Assessment (using hand held doppler) ?ASSESSMENTS - Ostomy and/or Continence Assessment and Care ?'[]'$  - Incontinence Assessment and Management 0 ?'[]'$  - 0 ?Ostomy Care Assessment and Management (repouching, etc.) ?PROCESS - Coordination of Care ?X - Simple Patient / Family Education for ongoing care 1 15 ?'[]'$  - 0 ?Complex (extensive) Patient / Family Education for ongoing care ?'[]'$  - 0 ?Staff obtains Consents, Records, Test Results / Process Orders ?'[]'$  - 0 ?Staff telephones HHA, Nursing Homes / Clarify orders / etc ?'[]'$  - 0 ?Routine Transfer to another Facility (non-emergent condition) ?'[]'$  - 0 ?Routine Hospital Admission (non-emergent condition) ?'[]'$  - 0 ?New Admissions / Biomedical engineer / Ordering NPWT, Apligraf, etc. ?'[]'$  - 0 ?Emergency Hospital Admission (emergent condition) ?X- 1 10 ?Simple Discharge Coordination ?'[]'$  - 0 ?Complex (extensive) Discharge Coordination ?PROCESS - Special Needs ?'[]'$  - Pediatric / Minor Patient Management 0 ?'[]'$  - 0 ?Isolation Patient Management ?'[]'$  - 0 ?Hearing / Language / Visual special needs ?'[]'$  - 0 ?Assessment of Community assistance (transportation, D/C planning, etc.) ?'[]'$  - 0 ?Additional assistance / Altered mentation ?'[]'$  - 0 ?Support Surface(s) Assessment (bed, cushion, seat, etc.) ?INTERVENTIONS - Wound Cleansing /  Measurement ?Christopher Escobar, Christopher Escobar. ([id number]) ?X- 1 5 ?Simple Wound Cleansing - one wound ?'[]'$  - 0 ?Complex Wound Cleansing - multiple wounds ?X- 1 5 ?Wound Imaging (photographs - any number of wounds) ?'[]'$  - 0 ?  Wound Tracing (instead of photographs) ?X- 1 5 ?Simple Wound Measurement - one wound ?'[]'$  - 0 ?Complex Wound Measurement - multiple wounds ?INTERVENTIONS - Wound Dressings ?X - Small Wound Dressing one or multiple wounds 1 10 ?'[]'$  - 0 ?Medium Wound Dressing one or multiple wounds ?'[]'$  - 0 ?Large Wound Dressing one or multiple wounds ?X- 1 5 ?Application of Medications - topical ?'[]'$  - 0 ?Application of Medications - injection ?INTERVENTIONS - Miscellaneous ?'[]'$  - External ear exam 0 ?'[]'$  - 0 ?Specimen Collection (cultures, biopsies, blood, body fluids, etc.) ?'[]'$  - 0 ?Specimen(s) / Culture(s) sent or taken to Lab for analysis ?'[]'$  - 0 ?Patient Transfer (multiple staff / Civil Service fast streamer / Similar devices) ?'[]'$  - 0 ?Simple Staple / Suture removal (25 or less) ?'[]'$  - 0 ?Complex Staple / Suture removal (26 or more) ?'[]'$  - 0 ?Hypo / Hyperglycemic Management (close monitor of Blood Glucose) ?'[]'$  - 0 ?Ankle / Brachial Index (ABI) - do not check if billed separately ?X- 1 5 ?Vital Signs ?Has the patient been seen at the hospital within the last three years: Yes ?Total Score: 65 ?Level Of Care: New/Established - Level ?2 ?Electronic Signature(s) ?Signed: 07/24/2021 4:04:00 PM By: Donnamarie Poag ?Entered ByDonnamarie Poag on 07/24/2021 13:23:14 ?Christopher Escobar, Christopher Escobar. ([id number]) ?-------------------------------------------------------------------------------- ?Encounter Discharge Information Details ?Patient Name: Christopher Escobar, Christopher Escobar. ?Date of Service: 07/24/2021 1:00 PM ?Medical Record Number: [id number] ?Patient Account Number: [id number] ?Date of Birth/Sex: September 18, 1955 (66 y.o. M) ?Treating RN: Donnamarie Poag ?Primary Care Latreece Mochizuki: Benita Stabile Other Clinician: ?Referring Levi Crass: Benita Stabile ?Treating Tylicia Sherman/Extender: Kalman Shan ?Weeks in  Treatment: 4 ?Encounter Discharge Information Items ?Discharge Condition: Stable ?Ambulatory Status: Ambulatory ?Discharge Destination: Home ?Transportation: Private Auto ?Accompanied By: self ?Schedule Follow-up Appointment: Yes ?Clinical Summary of Care: ?Electronic Signature(s) ?Signed: 07/24/2021 4:04:00 PM By: Donnamarie Poag ?Entered ByDonnamarie Poag on 07/24/2021 13:26:08 ?Christopher Escobar, Christopher Escobar. ([id number]) ?-------------------------------------------------------------------------------- ?Lower Extremity Assessment Details ?Patient Name: Christopher Escobar, Christopher Escobar. ?Date of Service: 07/24/2021 1:00 PM ?Medical Record Number: [id number] ?Patient Account Number: [id number] ?Date of Birth/Sex: Nov 08, 1955 (66 y.o. M) ?Treating RN: Donnamarie Poag ?Primary Care Elbony Mcclimans: Benita Stabile Other Clinician: ?Referring Takeyla Million: Benita Stabile ?Treating Foday Cone/Extender: Kalman Shan ?Weeks in Treatment: 4 ?Edema Assessment ?Assessed: [Left: No] [Right: Yes] ?Edema: [Left: N] [Right: o] ?Calf ?Left: Right: ?Point of Measurement: 36 cm From Medial Instep 37.5 cm ?Ankle ?Left: Right: ?Point of Measurement: 11 cm From Medial Instep 22 cm ?Vascular Assessment ?Pulses: ?Dorsalis Pedis ?Palpable: [Right:Yes] ?Electronic Signature(s) ?Signed: 07/24/2021 4:04:00 PM By: Donnamarie Poag ?Entered ByDonnamarie Poag on 07/24/2021 13:09:16 ?Christopher Escobar, Christopher Escobar. ([id number]) ?-------------------------------------------------------------------------------- ?Multi Wound Chart Details ?Patient Name: Christopher Escobar, Christopher Escobar. ?Date of Service: 07/24/2021 1:00 PM ?Medical Record Number: [id number] ?Patient Account Number: [id number] ?Date of Birth/Sex: 1955/10/13 (66 y.o. M) ?Treating RN: Donnamarie Poag ?Primary Care Azarie Coriz: Benita Stabile Other Clinician: ?Referring Uzoma Vivona: Benita Stabile ?Treating Laiylah Roettger/Extender: Kalman Shan ?Weeks in Treatment: 4 ?Vital Signs ?Height(in): 71 ?Pulse(bpm): 80 ?Weight(lbs): 254 ?Blood Pressure(mmHg): 113/75 ?Body Mass Index(BMI):  35.4 ?Temperature(??F): 97.8 ?Respiratory Rate(breaths/min): 16 ?Photos: [N/A:N/A] ?Wound Location: Right, Plantar Foot N/A N/A ?Wounding Event: Blister N/A N/A ?Primary Etiology: Diabetic Wound/Ulcer of the Lower N/A N/A ?Extremity ?Comorbid History

## 2021-07-24 NOTE — Progress Notes (Signed)
Christopher, Escobar. ([id number]) ?Visit Report for 07/24/2021 ?Chief Complaint Document Details ?Patient Name: Christopher Escobar, Christopher Escobar. ?Date of Service: 07/24/2021 1:00 PM ?Medical Record Number: [id number] ?Patient Account Number: [id number] ?Date of Birth/Sex: July 13, 1955 (66 y.o. M) ?Treating RN: Donnamarie Poag ?Primary Care Provider: Benita Stabile Other Clinician: ?Referring Provider: Benita Stabile ?Treating Provider/Extender: Kalman Shan ?Weeks in Treatment: 4 ?Information Obtained from: Patient ?Chief Complaint ?06/13/2021; Right plantar diabetic foot wound ?Electronic Signature(s) ?Signed: 07/24/2021 2:08:03 PM By: Kalman Shan DO ?Entered By: Kalman Shan on 07/24/2021 13:27:48 ?SAFAL, HALDERMAN. ([id number]) ?-------------------------------------------------------------------------------- ?HPI Details ?Patient Name: Christopher, Escobar. ?Date of Service: 07/24/2021 1:00 PM ?Medical Record Number: [id number] ?Patient Account Number: [id number] ?Date of Birth/Sex: [birth year] (67 y.o. M) ?Treating RN: Donnamarie Poag ?Primary Care Provider: Benita Stabile Other Clinician: ?Referring Provider: Benita Stabile ?Treating Provider/Extender: Kalman Shan ?Weeks in Treatment: 4 ?History of Present Illness ?HPI Description: Admission 06/13/2021 ?Mr. Malic Rosten is a 66 year old male with a past medical history of insulin-dependent type 2 diabetes with current self-reported hemoglobin ?A1c of 6.8, and peripheral neuropathy that presents to the clinic for a 35-monthhistory of nonhealing ulcer to the right plantar foot. He follows with ?Dr. PPosey Pronto podiatry for this issue. He has been using Betadine wet-to-dry dressings. He is not currently using any offloading device. He denies ?systemic signs of infection. ?06/20/2021; patient presents for follow-up. He obtained his x-ray of the right foot. He has not heard from vein and vascular about setting up an ?appointment for his ABIs. He has been using Hydrofera Blue to the wound bed and using his  front offloading shoe. He reports improvement in ?wound healing. ?3/29; patient presents for follow-up. [facility] I have ?been following him for his diabetic foot wound. He states he received the defender boot and has been using it for the past 3 days without issues. ?He continues to use Hydrofera Blue to the wound bed. He is scheduled for his MRI on 3/31. He denies signs of infection. ?4/5; patient presents for follow-up. He has been using Hydrofera Blue to the wound bed without issues. He denies signs of infection. He has been ?using the dConservation officer, nature He was scheduled for his MRI on 3/31 however insurance denied coverage. It is unclear why. The x-ray showed ?suspicious osteo to the second medial met head Where the wound is at. He currently denies systemic signs of infection. He had ABIs completed ?that showed a right ABI of 1.27 and right toe pressure of 0.84 with triphasic waveforms at ATA and PTA. ?4/12; right fourth met head diabetic foot wound. [facility] dConservation officer, nature Apparently some suspicion of osteomyelitis noted on ?his plain films but he was denied for an MRI. He states he is being religious with use of the defender boot and offloading his foot ?4/19; patient presents for follow-up. He has been using Hydrofera Blue with the dConservation officer, nature He had his MRI completed. He currently denies ?signs of infection. ?4/26; patient presents for follow-up. [facility] He continues using the dConservation officer, naturehe has no issues or ?complaints today. He denies signs of infection. ?Electronic Signature(s) ?Signed: 07/24/2021 2:08:03 PM By: HKalman ShanDO ?Entered By: HKalman Shanon 07/24/2021 13:28:22 ?WCLEARNCE, LEJA ([id number] ?-------------------------------------------------------------------------------- ?Physical Exam Details ?Patient Name: Christopher, Escobar ?Date of Service: 07/24/2021 1:00 PM ?Medical Record Number: [id number]?Patient Account Number: [id number]?Date of Birth/Sex: [birth year](  66 y.o. M) ?Treating RN: Donnamarie Poag ?Primary Care Provider: Benita Stabile Other Clinician: ?Referring Provider: Benita Stabile ?Treating Provider/Extender: Kalman Shan ?Weeks in Treatment: 4 ?Constitutional ?. ?Cardiovascular ?Marland Kitchen ?Psychiatric ?Marland Kitchen ?Notes ?Right lower extremity: To the right plantar fourth metatarsal head there is a small circular punched-out wound with granulation tissue. No signs of ?surrounding infection. ?Electronic Signature(s) ?Signed: 07/24/2021 2:08:03 PM By: Kalman Shan DO ?Entered By: Kalman Shan on 07/24/2021 13:28:47 ?BALIAN, SCHALLER. ([id number]) ?-------------------------------------------------------------------------------- ?Physician Orders Details ?Patient Name: Christopher, Escobar. ?Date of Service: 07/24/2021 1:00 PM ?Medical Record Number: [id number] ?Patient Account Number: [id number] ?Date of Birth/Sex: 16-Dec-1955 (66 y.o. M) ?Treating RN: Donnamarie Poag ?Primary Care Provider: Benita Stabile Other Clinician: ?Referring Provider: Benita Stabile ?Treating Provider/Extender: Kalman Shan ?Weeks in Treatment: 4 ?Verbal / Phone Orders: No ?Diagnosis Coding ?Follow-up Appointments ?o Return Appointment in 1 week. ?Bathing/ Shower/ Hygiene ?o May shower; gently cleanse wound with antibacterial soap, rinse and pat dry prior to dressing wounds - keep dressing dry or ?change after shower ?o No tub bath. ?Anesthetic (Use 'Patient Medications' Section for Anesthetic Order Entry) ?o Lidocaine applied to wound bed ?Edema Control - Lymphedema / Segmental Compressive Device / Other ?o Elevate leg(s) parallel to the floor when sitting. ?o DO YOUR BEST to sleep in the bed at night. DO NOT sleep in your recliner. Long hours of sitting in a recliner leads to ?swelling of the legs and/or potential wounds on your backside. ?Off-Loading ?o Foot  Defender o - right foot-keep shin covered for protection ?o Turn and reposition every 2 hours ?Additional Orders / Instructions ?o Follow Nutritious Diet and Increase Protein Intake - monitor blood sugar to maintain normal level ?Wound Treatment ?Wound #1 - Foot Wound Laterality: Plantar, Right ?Cleanser: Soap and Water 1 x Per Day/30 Days ?Discharge Instructions: Gently cleanse wound with antibacterial soap, rinse and pat dry prior to dressing wounds ?Cleanser: Wound Cleanser 1 x Per Day/30 Days ?Discharge Instructions: Wash your hands with soap and water. Remove old dressing, discard into plastic bag and place into trash. ?Cleanse the wound with Wound Cleanser prior to applying a clean dressing using gauze sponges, not tissues or cotton balls. Do not ?scrub or use excessive force. Pat dry using gauze sponges, not tissue or cotton balls. ?Primary Dressing: Hydrofera Blue Ready Transfer Foam, 2.5x2.5 (in/in) 1 x Per Day/30 Days ?Discharge Instructions: Apply Hydrofera Blue Ready to wound bed as directed ?Secondary Dressing: ABD Pad 5x9 (in/in) 1 x Per Day/30 Days ?Discharge Instructions: Cover with ABD pad ?Secured With: Medipore Tape - 62M Medipore H Soft Cloth Surgical Tape, 2x2 (in/yd) 1 x Per Day/30 Days ?Secured With: [employer] or Non-Sterile 6-ply 4.5x4 (yd/yd) 1 x Per Day/30 Days ?Discharge Instructions: Apply Kerlix as directed ?Electronic Signature(s) ?Signed: 07/24/2021 2:08:03 PM By: Kalman Shan DO ?Entered By: Kalman Shan on 07/24/2021 13:30:27 ?BARNABY, RIPPEON. ([id number]) ?-------------------------------------------------------------------------------- ?Problem List Details ?Patient Name: KENITH, TRICKEL. ?Date of Service: 07/24/2021 1:00 PM ?Medical Record Number: [id number] ?Patient Account Number: [id number] ?Date of Birth/Sex: Jun 29, 1955 (66 y.o. M) ?Treating RN: Donnamarie Poag ?Primary Care Provider: Benita Stabile Other Clinician: ?Referring Provider: Benita Stabile ?Treating  Provider/Extender: Kalman Shan ?Weeks in Treatment: 4 ?Active Problems ?ICD-10 ?Encounter ?Code Description Active Date MDM ?Diagnosis ?E11.621 Type 2 diabetes mellitus with foot ulcer 06/26/2021 No Yes ?J85.631 Non-

## 2021-07-31 ENCOUNTER — Encounter: Payer: Medicare HMO | Attending: Internal Medicine | Admitting: Internal Medicine

## 2021-07-31 DIAGNOSIS — L97512 Non-pressure chronic ulcer of other part of right foot with fat layer exposed: Secondary | ICD-10-CM

## 2021-07-31 DIAGNOSIS — R2689 Other abnormalities of gait and mobility: Secondary | ICD-10-CM

## 2021-07-31 DIAGNOSIS — E1142 Type 2 diabetes mellitus with diabetic polyneuropathy: Secondary | ICD-10-CM | POA: Insufficient documentation

## 2021-07-31 DIAGNOSIS — E11621 Type 2 diabetes mellitus with foot ulcer: Secondary | ICD-10-CM

## 2021-07-31 DIAGNOSIS — Z794 Long term (current) use of insulin: Secondary | ICD-10-CM | POA: Insufficient documentation

## 2021-07-31 NOTE — Progress Notes (Signed)
LAQUAN, BEIER. ([id number]) ?Visit Report for 07/31/2021 ?Chief Complaint Document Details ?Patient Name: Christopher Escobar, Christopher Escobar. ?Date of Service: 07/31/2021 1:00 PM ?Medical Record Number: [id number] ?Patient Account Number: [id number] ?Date of Birth/Sex: Jul 13, 1955 (66 y.o. M) ?Treating RN: Donnamarie Poag ?Primary Care Provider: Benita Stabile Other Clinician: ?Referring Provider: Benita Stabile ?Treating Provider/Extender: Kalman Shan ?Weeks in Treatment: 5 ?Information Obtained from: Patient ?Chief Complaint ?06/13/2021; Right plantar diabetic foot wound ?Electronic Signature(s) ?Signed: 07/31/2021 2:19:50 PM By: Kalman Shan DO ?Entered By: Kalman Shan on 07/31/2021 13:38:00 ?Christopher Escobar, Christopher Escobar. ([id number]) ?-------------------------------------------------------------------------------- ?Debridement Details ?Patient Name: Christopher Escobar, Christopher Escobar. ?Date of Service: 07/31/2021 1:00 PM ?Medical Record Number: [id number] ?Patient Account Number: [id number] ?Date of Birth/Sex: 1955-09-23 (66 y.o. M) ?Treating RN: Donnamarie Poag ?Primary Care Provider: Benita Stabile Other Clinician: ?Referring Provider: Benita Stabile ?Treating Provider/Extender: Kalman Shan ?Weeks in Treatment: 5 ?Debridement Performed for ?Wound #1 Right,Plantar Foot ?Assessment: ?Performed By: Physician Kalman Shan, MD ?Debridement Type: Debridement ?Severity of Tissue Pre Debridement: Fat layer exposed ?Level of Consciousness (Pre- ?Awake and Alert ?procedure): ?Pre-procedure Verification/Time Out ?Yes - 13:29 ?Taken: ?Start Time: 13:30 ?Pain Control: Lidocaine ?Total Area Debrided (L x W): 0.7 (cm) x 0.8 (cm) = 0.56 (cm?) ?Tissue and other material ?Viable, Non-Viable, Callus, Skin: Dermis ?debrided: ?Level: Skin/Dermis ?Debridement Description: Selective/Open Wound ?Instrument: Curette ?Bleeding: Minimum ?Hemostasis Achieved: Pressure ?Response to Treatment: Procedure was tolerated well ?Level of Consciousness (Post- ?Awake and Alert ?procedure): ?Post  Debridement Measurements of Total Wound ?Length: (cm) 0.5 ?Width: (cm) 0.6 ?Depth: (cm) 0.3 ?Volume: (cm?) 0.071 ?Character of Wound/Ulcer Post Debridement: Improved ?Severity of Tissue Post Debridement: Fat layer exposed ?Post Procedure Diagnosis ?Same as Pre-procedure ?Electronic Signature(s) ?Signed: 07/31/2021 2:19:31 PM By: Donnamarie Poag ?Signed: 07/31/2021 2:19:50 PM By: Kalman Shan DO ?Entered ByDonnamarie Poag on 07/31/2021 13:33:01 ?Christopher Escobar, Christopher Escobar. ([id number]) ?-------------------------------------------------------------------------------- ?HPI Details ?Patient Name: Christopher Escobar, Christopher Escobar. ?Date of Service: 07/31/2021 1:00 PM ?Medical Record Number: [id number] ?Patient Account Number: [id number] ?Date of Birth/Sex: April 09, 1955 (66 y.o. M) ?Treating RN: Donnamarie Poag ?Primary Care Provider: Benita Stabile Other Clinician: ?Referring Provider: Benita Stabile ?Treating Provider/Extender: Kalman Shan ?Weeks in Treatment: 5 ?History of Present Illness ?HPI Description: Admission 06/13/2021 ?[name] is a 66 year old male with a past medical history of insulin-dependent type 2 diabetes with current self-reported hemoglobin ?A1c of 6.8, and peripheral neuropathy that presents to the clinic for a 18-monthhistory of nonhealing ulcer to the right plantar foot. He follows with ?Dr. PPosey Pronto podiatry for this issue. He has been using Betadine wet-to-dry dressings. He is not currently using any offloading device. He denies ?systemic signs of infection. ?06/20/2021; patient presents for follow-up. He obtained his x-ray of the right foot. He has not heard from vein and vascular about setting up an ?appointment for his ABIs. He has been using Hydrofera Blue to the wound bed and using his front offloading shoe. He reports improvement in ?wound healing. ?3/29; patient presents for follow-up. [facility] I have ?been following him for his  diabetic foot wound. He states he received the defender boot and has been using it for the past 3 days without issues. ?He continues to use Hydrofera Blue to the wound bed. He is scheduled for his MRI on 3/31. He denies signs of infection. ?4/5; patient presents for follow-up. He has been using Hydrofera Blue to the wound bed without issues. He denies signs of infection. He has been ?using the dConservation officer, nature  He was scheduled for his MRI on 3/31 however insurance denied coverage. It is unclear why. The x-ray showed ?suspicious osteo to the second medial met head Where the wound is at. He currently denies systemic signs of infection. He had ABIs completed ?that showed a right ABI of 1.27 and right toe pressure of 0.84 with triphasic waveforms at ATA and PTA. ?4/12; right fourth met head diabetic foot wound. Using [facility] has a Conservation officer, nature. Apparently some suspicion of osteomyelitis noted on ?his plain films but he was denied for an MRI. He states he is being religious with use of the defender boot and offloading his foot ?4/19; patient presents for follow-up. He has been using Hydrofera Blue with the Conservation officer, nature. He had his MRI completed. He currently denies ?signs of infection. ?4/26; patient presents for follow-up. He continues to use [facility] without issues. He continues using the Conservation officer, nature he has no issues or ?complaints today. He denies signs of infection. ?5/3; patient presents for follow-up. He has been using Hydrofera Blue to the wound bed without issues. He is using the Conservation officer, nature. He states ?he has a knee scooter. His wife went on a girls trip and he has been on his feet more often in the past week. He currently denies signs of ?infection. ?Electronic Signature(s) ?Signed: 07/31/2021 2:19:50 PM By: Kalman Shan DO ?Entered By: Kalman Shan on 07/31/2021 13:38:37 ?Christopher Escobar, Christopher Escobar.  ([id number]) ?-------------------------------------------------------------------------------- ?Physical Exam Details ?Patient Name: Christopher Escobar, Christopher Escobar. ?Date of Service: 07/31/2021 1:00 PM ?Medical Record Number: [id number] ?Patient Account Number: [id number] ?Date of Birth/Sex: 1955-06-18 (66 y.o. M) ?Treating RN: Donnamarie Poag ?Primary Care Provider: Benita Stabile Other Clinician: ?Referring Provider: Benita Stabile ?Treating Provider/Extender: Kalman Shan ?Weeks in Treatment: 5 ?Constitutional ?. ?Cardiovascular ?Marland Kitchen ?Psychiatric ?Marland Kitchen ?Notes ?Right lower extremity: To the right plantar fourth metatarsal head there is a small circular punched-out wound with granulation tissue And ?devitalized tissue circumferentially. No signs of surrounding infection. ?Electronic Signature(s) ?Signed: 07/31/2021 2:19:50 PM By: Kalman Shan DO ?Entered By: Kalman Shan on 07/31/2021 13:39:07 ?Christopher Escobar, Christopher Escobar. ([id number]) ?-------------------------------------------------------------------------------- ?Physician Orders Details ?Patient Name: Christopher Escobar, Christopher Escobar. ?Date of Service: 07/31/2021 1:00 PM ?Medical Record Number: [id number] ?Patient Account Number: [id number] ?Date of Birth/Sex: 1955/04/19 (66 y.o. M) ?Treating RN: Donnamarie Poag ?Primary Care Provider: Benita Stabile Other Clinician: ?Referring Provider: Benita Stabile ?Treating Provider/Extender: Kalman Shan ?Weeks in Treatment: 5 ?Verbal / Phone Orders: No ?Diagnosis Coding ?Follow-up Appointments ?o Return Appointment in 1 week. ?Bathing/ Shower/ Hygiene ?o May shower; gently cleanse wound with antibacterial soap, rinse and pat dry prior to dressing wounds - keep dressing dry or ?change after shower ?o No tub bath. ?Anesthetic (Use 'Patient Medications' Section for Anesthetic Order Entry) ?o Lidocaine applied to wound bed ?Edema Control - Lymphedema / Segmental Compressive Device / Other ?o Elevate leg(s) parallel to the floor when sitting. ?o DO YOUR BEST to sleep in  the bed at night. DO NOT sleep in your recliner. Long hours of sitting in a recliner leads to ?swelling of the legs and/or potential wounds on your backside. ?Off-Loading ?o Foot Defender o - right foot-keep shin covered for protection ?o Turn and reposition every 2 hours ?Additional Orders / Instructions

## 2021-07-31 NOTE — Progress Notes (Signed)
Christopher Escobar. ([id number]) ?Visit Report for 07/31/2021 ?Arrival Information Details ?Patient Name: Christopher Escobar, Christopher Escobar. ?Date of Service: 07/31/2021 1:00 PM ?Medical Record Number: [id number] ?Patient Account Number: [id number] ?Date of Birth/Sex: 1955-10-15 (66 y.o. M) ?Treating RN: Donnamarie Poag ?Primary Care Naarah Borgerding: Benita Stabile Other Clinician: ?Referring Hassan Blackshire: Benita Stabile ?Treating Amarii Amy/Extender: Kalman Shan ?Weeks in Treatment: 5 ?Visit Information History Since Last Visit ?Added or deleted any medications: No ?Patient Arrived: Ambulatory ?Had a fall or experienced change in No ?Arrival Time: 13:01 ?activities of daily living that may affect ?Accompanied By: self ?risk of falls: ?Transfer Assistance: None ?Hospitalized since last visit: No ?Patient Identification Verified: Yes ?Has Dressing in Place as Prescribed: Yes ?Secondary Verification Process Completed: Yes ?Has Footwear/Offloading in Place as Prescribed: Yes ?Patient Requires Transmission-Based No ?Right: Other:foot defender boot ?Precautions: ?Pain Present Now: Yes ?Patient Has Alerts: Yes ?Patient Alerts: DIABETIC ?AVVS ABI/TBI ?07/03/21 ?R 1.27; L 1.23 ?Electronic Signature(s) ?Signed: 07/31/2021 2:19:31 PM By: Donnamarie Poag ?Entered ByDonnamarie Poag on 07/31/2021 13:05:08 ?TAGG, EUSTICE. ([id number]) ?-------------------------------------------------------------------------------- ?Encounter Discharge Information Details ?Patient Name: Christopher Escobar. ?Date of Service: 07/31/2021 1:00 PM ?Medical Record Number: [id number] ?Patient Account Number: [id number] ?Date of Birth/Sex: [birth year] (66 y.o. M) ?Treating RN: Donnamarie Poag ?Primary Care Zameria Vogl: Benita Stabile Other Clinician: ?Referring Britny Riel: Benita Stabile ?Treating Daouda Lonzo/Extender: Kalman Shan ?Weeks in Treatment: 5 ?Encounter Discharge Information Items Post Procedure Vitals ?Discharge Condition: Stable ?Temperature (?F): 97.8 ?Ambulatory Status: Ambulatory ?Pulse (bpm): 50 ?Discharge  Destination: Home ?Respiratory Rate (breaths/min): 16 ?Transportation: Private Auto ?Blood Pressure (mmHg): 100/66 ?Accompanied By: self ?Schedule Follow-up Appointment: Yes ?Clinical Summary of Care: ?Electronic Signature(s) ?Signed: 07/31/2021 2:19:31 PM By: Donnamarie Poag ?Entered ByDonnamarie Poag on 07/31/2021 13:33:41 ?KAJUAN, GUYTON. ([id number]) ?-------------------------------------------------------------------------------- ?Lower Extremity Assessment Details ?Patient Name: Christopher Escobar. ?Date of Service: 07/31/2021 1:00 PM ?Medical Record Number: [id number] ?Patient Account Number: [id number] ?Date of Birth/Sex: Oct 27, 1955 (66 y.o. M) ?Treating RN: Donnamarie Poag ?Primary Care Denell Cothern: Benita Stabile Other Clinician: ?Referring Favio Moder: Benita Stabile ?Treating Nakeisha Greenhouse/Extender: Kalman Shan ?Weeks in Treatment: 5 ?Edema Assessment ?Assessed: [Left: No] [Right: Yes] ?Edema: [Left: N] [Right: o] ?Vascular Assessment ?Pulses: ?Dorsalis Pedis ?Palpable: [Right:Yes] ?Electronic Signature(s) ?Signed: 07/31/2021 2:19:31 PM By: Donnamarie Poag ?Entered ByDonnamarie Poag on 07/31/2021 13:11:34 ?GUSSIE, TOWSON. ([id number]) ?-------------------------------------------------------------------------------- ?Multi Wound Chart Details ?Patient Name: Christopher Escobar. ?Date of Service: 07/31/2021 1:00 PM ?Medical Record Number: [id number] ?Patient Account Number: [id number] ?Date of Birth/Sex: Aug 19, 1955 (66 y.o. M) ?Treating RN: Donnamarie Poag ?Primary Care Charletta Voight: Benita Stabile Other Clinician: ?Referring Pamula Luther: Benita Stabile ?Treating Delmo Matty/Extender: Kalman Shan ?Weeks in Treatment: 5 ?Vital Signs ?Height(in): 71 ?Pulse(bpm): 48 ?Weight(lbs): 254 ?Blood Pressure(mmHg): 100/66 ?Body Mass Index(BMI): 35.4 ?Temperature(??F): 97.8 ?Respiratory Rate(breaths/min): 16 ?Photos: [N/A:N/A] ?Wound Location: Right, Plantar Foot N/A N/A ?Wounding Event: Blister N/A N/A ?Primary Etiology: Diabetic Wound/Ulcer of the Lower N/A  N/A ?Extremity ?Comorbid History: Sleep Apnea, Hypertension, Type II N/A N/A ?Diabetes, History of pressure ?wounds, Osteomyelitis, Neuropathy ?Date Acquired: 03/14/2021 N/A N/A ?Weeks of Treatment: 5 N/A N/A ?Wound Status: Open N/A N/A ?Wound Recurrence: No N/A N/A ?Measurements L x W x D (cm) 0.5x0.6x0.3 N/A N/A ?Area (cm?) : 0.236 N/A N/A ?Volume (cm?) : 0.071 N/A N/A ?% Reduction in Area: 72.70% N/A N/A ?% Reduction in Volume: 83.60% N/A N/A ?Starting Position 1 (o'clock): 8 ?Ending Position 1 (o'clock): 12 ?Maximum Distance 1 (cm): 0.3 ?Undermining: Yes N/A N/A ?Classification: Grade 2 N/A N/A ?Exudate Amount: Medium N/A N/A ?Exudate Type: Serosanguineous N/A N/A ?Exudate Color: red, brown N/A  N/A ?Wound Margin: Thickened N/A N/A ?Granulation Amount: Large (67-100%) N/A N/A ?Granulation Quality: Red, Pink N/A N/A ?Necrotic Amount: Small (1-33%) N/A N/A ?Exposed Structures: ?Fat Layer (Subcutaneous Tissue): N/A N/A ?Yes ?Fascia: No ?Tendon: No ?Muscle: No ?Joint: No ?Bone: No ?Epithelialization: Large (67-100%) N/A N/A ?Treatment Notes ?Electronic Signature(s) ?KOLBIE, LEPKOWSKI. ([id number]) ?Signed: 07/31/2021 2:19:31 PM By: Donnamarie Poag ?Entered ByDonnamarie Poag on 07/31/2021 13:12:01 ?YASIR, KITNER. ([id number]) ?-------------------------------------------------------------------------------- ?Multi-Disciplinary Care Plan Details ?Patient Name: Christopher Escobar. ?Date of Service: 07/31/2021 1:00 PM ?Medical Record Number: [id number] ?Patient Account Number: [id number] ?Date of Birth/Sex: 1956/02/26 (66 y.o. M) ?Treating RN: Donnamarie Poag ?Primary Care Obrien Huskins: Benita Stabile Other Clinician: ?Referring Micajah Dennin: Benita Stabile ?Treating Shaquel Josephson/Extender: Kalman Shan ?Weeks in Treatment: 5 ?Active Inactive ?Wound/Skin Impairment ?Nursing Diagnoses: ?Impaired tissue integrity ?Knowledge deficit related to smoking impact on wound healing ?Knowledge deficit related to ulceration/compromised skin  integrity ?Goals: ?Patient/caregiver will verbalize understanding of skin care regimen ?Date Initiated: 06/26/2021 ?Date Inactivated: 07/24/2021 ?Target Resolution Date: 07/05/2021 ?Goal Status: Met ?Ulcer/skin breakdown will have a volume reduction of 30% by week 4 ?Date Initiated: 06/26/2021 ?Target Resolution Date: 07/24/2021 ?Goal Status: Active ?Ulcer/skin breakdown will have a volume reduction of 50% by week 8 ?Date Initiated: 06/26/2021 ?Target Resolution Date: 08/21/2021 ?Goal Status: Active ?Ulcer/skin breakdown will have a volume reduction of 80% by week 12 ?Date Initiated: 06/26/2021 ?Target Resolution Date: 09/18/2021 ?Goal Status: Active ?Ulcer/skin breakdown will heal within 14 weeks ?Date Initiated: 06/26/2021 ?Target Resolution Date: 10/02/2021 ?Goal Status: Active ?Interventions: ?Assess patient/caregiver ability to obtain necessary supplies ?Assess patient/caregiver ability to perform ulcer/skin care regimen upon admission and as needed ?Assess ulceration(s) every visit ?Notes: ?Electronic Signature(s) ?Signed: 07/31/2021 2:19:31 PM By: Donnamarie Poag ?Entered ByDonnamarie Poag on 07/31/2021 13:11:48 ?BENFORD, ASCH. ([id number]) ?-------------------------------------------------------------------------------- ?Pain Assessment Details ?Patient Name: SAMAR, DASS. ?Date of Service: 07/31/2021 1:00 PM ?Medical Record Number: [id number] ?Patient Account Number: [id number] ?Date of Birth/Sex: 12-17-1955 (66 y.o. M) ?Treating RN: Donnamarie Poag ?Primary Care Davian Wollenberg: Benita Stabile Other Clinician: ?Referring Kseniya Grunden: Benita Stabile ?Treating Louden Houseworth/Extender: Kalman Shan ?Weeks in Treatment: 5 ?Active Problems ?Location of Pain Severity and Description of Pain ?Patient Has Paino Yes ?Site Locations ?Pain Location: ?Generalized Pain, Pain in Ulcers ?Rate the pain. ?Current Pain Level: 3 ?Pain Management and Medication ?Current Pain Management: ?Electronic Signature(s) ?Signed: 07/31/2021 2:19:31 PM By: Donnamarie Poag ?Entered  ByDonnamarie Poag on 07/31/2021 13:08:07 ?RAWSON, MINIX. ([id number]) ?-------------------------------------------------------------------------------- ?Patient/Caregiver Education Details ?Patient Name: DEMETRIO, LEIGHTY. ?Date of Ser

## 2021-08-07 ENCOUNTER — Encounter (HOSPITAL_BASED_OUTPATIENT_CLINIC_OR_DEPARTMENT_OTHER): Payer: Medicare HMO | Admitting: Internal Medicine

## 2021-08-07 DIAGNOSIS — L97512 Non-pressure chronic ulcer of other part of right foot with fat layer exposed: Secondary | ICD-10-CM

## 2021-08-07 DIAGNOSIS — E11621 Type 2 diabetes mellitus with foot ulcer: Secondary | ICD-10-CM

## 2021-08-07 NOTE — Progress Notes (Signed)
KIYOTO, SLOMSKI. ([id number]) ?Visit Report for 08/07/2021 ?Arrival Information Details ?Patient Name: MEDHANSH, BRINKMEIER. ?Date of Service: 08/07/2021 1:00 PM ?Medical Record Number: [id number] ?Patient Account Number: [id number] ?Date of Birth/Sex: Jan 10, 1956 (66 y.o. M) ?Treating RN: Donnamarie Poag ?Primary Care Caterin Tabares: Benita Stabile ?Other Clinician: Cornell Barman ?Referring Julies Carmickle: Benita Stabile ?Treating Zyaire Dumas/Extender: Kalman Shan ?Weeks in Treatment: 6 ?Visit Information History Since Last Visit ?Added or deleted any medications: No ?Patient Arrived: Ambulatory ?Had a fall or experienced change in No ?Arrival Time: 12:53 ?activities of daily living that may affect ?Accompanied By: self ?risk of falls: ?Transfer Assistance: None ?Hospitalized since last visit: No ?Patient Identification Verified: Yes ?Has Dressing in Place as Prescribed: Yes ?Secondary Verification Process Completed: Yes ?Has Compression in Place as Prescribed: Yes ?Patient Requires Transmission-Based No ?Has Footwear/Offloading in Place as Prescribed: Yes ?Precautions: ?Right: Other:foot defender boot ?Patient Has Alerts: Yes ?Pain Present Now: No ?Patient Alerts: DIABETIC ?[facility] ABI/TBI ?07/03/21 ?R 1.27; L 1.23 ?Electronic Signature(s) ?Signed: 08/07/2021 2:56:37 PM By: Donnamarie Poag ?Entered ByDonnamarie Poag on 08/07/2021 13:00:32 ?HAWKE, VILLALPANDO. ([id number]) ?-------------------------------------------------------------------------------- ?Encounter Discharge Information Details ?Patient Name: JAHMIR, SALO. ?Date of Service: 08/07/2021 1:00 PM ?Medical Record Number: [id number] ?Patient Account Number: [id number] ?Date of Birth/Sex: 1955-10-03 (66 y.o. M) ?Treating RN: Donnamarie Poag ?Primary Care Liban Guedes: Benita Stabile ?Other Clinician: Cornell Barman ?Referring Carlene Bickley: Benita Stabile ?Treating Judea Riches/Extender: Kalman Shan ?Weeks in Treatment: 6 ?Encounter Discharge Information Items ?Discharge Condition: Stable ?Ambulatory Status:  Ambulatory ?Discharge Destination: Home ?Transportation: Private Auto ?Accompanied By: driver ?Schedule Follow-up Appointment: Yes ?Clinical Summary of Care: ?Electronic Signature(s) ?Signed: 08/07/2021 2:56:37 PM By: Donnamarie Poag ?Entered ByDonnamarie Poag on 08/07/2021 13:43:02 ?TALTON, DELPRIORE. ([id number]) ?-------------------------------------------------------------------------------- ?Lower Extremity Assessment Details ?Patient Name: JOHNCHRISTOPHER, SARVIS. ?Date of Service: 08/07/2021 1:00 PM ?Medical Record Number: [id number] ?Patient Account Number: [id number] ?Date of Birth/Sex: [birth year] (66 y.o. M) ?Treating RN: Donnamarie Poag ?Primary Care Arend Bahl: Benita Stabile ?Other Clinician: Cornell Barman ?Referring Soledad Budreau: Benita Stabile ?Treating Boots Mcglown/Extender: Kalman Shan ?Weeks in Treatment: 6 ?Edema Assessment ?Assessed: [Left: No] [Right: Yes] ?Edema: [Left: N] [Right: o] ?Ankle ?Left: Right: ?Point of Measurement: 9 cm From Medial Instep 20.2 cm ?Electronic Signature(s) ?Signed: 08/07/2021 2:56:37 PM By: Donnamarie Poag ?Entered ByDonnamarie Poag on 08/07/2021 13:07:31 ?HASTON, CASEBOLT. ([id number]) ?-------------------------------------------------------------------------------- ?Multi Wound Chart Details ?Patient Name: DEONDREA, MARKOS. ?Date of Service: 08/07/2021 1:00 PM ?Medical Record Number: [id number] ?Patient Account Number: [id number] ?Date of Birth/Sex: 1956/03/18 (66 y.o. M) ?Treating RN: Donnamarie Poag ?Primary Care Gailene Youkhana: Benita Stabile ?Other Clinician: Cornell Barman ?Referring Midge Momon: Benita Stabile ?Treating Lynise Porr/Extender: Kalman Shan ?Weeks in Treatment: 6 ?Vital Signs ?Height(in): 71 ?Pulse(bpm): 80 ?Weight(lbs): 254 ?Blood Pressure(mmHg): 115/65 ?Body Mass Index(BMI): 35.4 ?Temperature(??F): 97.6 ?Respiratory Rate(breaths/min): 16 ?Photos: [N/A:N/A] ?Wound Location: Right, Plantar Foot N/A N/A ?Wounding Event: Blister N/A N/A ?Primary Etiology: Diabetic Wound/Ulcer of the Lower N/A  N/A ?Extremity ?Comorbid History: Sleep Apnea, Hypertension, Type II N/A N/A ?Diabetes, History of pressure ?wounds, Osteomyelitis, Neuropathy ?Date Acquired: 03/14/2021 N/A N/A ?Weeks of Treatment: 6 N/A N/A ?Wound Status: Open N/A N/A ?Wound Recurrence: No N/A N/A ?Measurements L x W x D (cm) 0.5x0.5x0.5 N/A N/A ?Area (cm?) : 0.196 N/A N/A ?Volume (cm?) : 0.098 N/A N/A ?% Reduction in Area: 77.30% N/A N/A ?% Reduction in Volume: 77.30% N/A N/A ?Starting Position 1 (o'clock): 8 ?Ending Position 1 (o'clock): 12 ?Maximum Distance 1 (cm): 0.5 ?Undermining: Yes N/A N/A ?Classification: Grade 2 N/A N/A ?Exudate Amount: Medium N/A N/A ?Exudate Type: Serosanguineous N/A N/A ?Exudate  Color: red, brown N/A N/A ?Wound Margin: Thickened N/A N/A ?Granulation Amount: Large (67-100%) N/A N/A ?Granulation Quality: Red, Pink N/A N/A ?Necrotic Amount: Small (1-33%) N/A N/A ?Exposed Structures: ?Fat Layer (Subcutaneous Tissue): N/A N/A ?Yes ?Fascia: No ?Tendon: No ?Muscle: No ?Joint: No ?Bone: No ?Epithelialization: Large (67-100%) N/A N/A ?Treatment Notes ?Electronic Signature(s) ?AJEET, CASASOLA. ([id number]) ?Signed: 08/07/2021 2:56:37 PM By: Donnamarie Poag ?Entered ByDonnamarie Poag on 08/07/2021 13:08:01 ?DIMA, FERRUFINO. ([id number]) ?-------------------------------------------------------------------------------- ?Multi-Disciplinary Care Plan Details ?Patient Name: IORI, GIGANTE. ?Date of Service: 08/07/2021 1:00 PM ?Medical Record Number: [id number] ?Patient Account Number: [id number] ?Date of Birth/Sex: November 25, 1955 (66 y.o. M) ?Treating RN: Donnamarie Poag ?Primary Care Emeli Goguen: Benita Stabile ?Other Clinician: Cornell Barman ?Referring Ramey Schiff: Benita Stabile ?Treating Jaydalynn Olivero/Extender: Kalman Shan ?Weeks in Treatment: 6 ?Active Inactive ?Wound/Skin Impairment ?Nursing Diagnoses: ?Impaired tissue integrity ?Knowledge deficit related to smoking impact on wound healing ?Knowledge deficit related to ulceration/compromised skin  integrity ?Goals: ?Patient/caregiver will verbalize understanding of skin care regimen ?Date Initiated: 06/26/2021 ?Date Inactivated: 07/24/2021 ?Target Resolution Date: 07/05/2021 ?Goal Status: Met ?Ulcer/skin breakdown will have a volume reduction of 30% by week 4 ?Date Initiated: 06/26/2021 ?Date Inactivated: 08/07/2021 ?Target Resolution Date: 07/24/2021 ?Unmet Reason: con't tx/consider ?Goal Status: Unmet ?TCC ?Ulcer/skin breakdown will have a volume reduction of 50% by week 8 ?Date Initiated: 06/26/2021 ?Target Resolution Date: 08/21/2021 ?Goal Status: Active ?Ulcer/skin breakdown will have a volume reduction of 80% by week 12 ?Date Initiated: 06/26/2021 ?Target Resolution Date: 09/18/2021 ?Goal Status: Active ?Ulcer/skin breakdown will heal within 14 weeks ?Date Initiated: 06/26/2021 ?Target Resolution Date: 10/02/2021 ?Goal Status: Active ?Interventions: ?Assess patient/caregiver ability to obtain necessary supplies ?Assess patient/caregiver ability to perform ulcer/skin care regimen upon admission and as needed ?Assess ulceration(s) every visit ?Notes: ?Electronic Signature(s) ?Signed: 08/07/2021 2:56:37 PM By: Donnamarie Poag ?Entered ByDonnamarie Poag on 08/07/2021 13:07:49 ?TRENNON, TORBECK. ([id number]) ?-------------------------------------------------------------------------------- ?Pain Assessment Details ?Patient Name: JAMOND, NEELS. ?Date of Service: 08/07/2021 1:00 PM ?Medical Record Number: [id number] ?Patient Account Number: [id number] ?Date of Birth/Sex: [birth year] (66 y.o. M) ?Treating RN: Donnamarie Poag ?Primary Care Sahej Schrieber: Benita Stabile ?Other Clinician: Cornell Barman ?Referring Zonnie Landen: Benita Stabile ?Treating Rylen Swindler/Extender: Kalman Shan ?Weeks in Treatment: 6 ?Active Problems ?Location of Pain Severity and Description of Pain ?Patient Has Paino No ?Site Locations ?Rate the pain. ?Current Pain Level: 0 ?Pain Management and Medication ?Current Pain Management: ?Electronic Signature(s) ?Signed: 08/07/2021  2:56:37 PM By: Donnamarie Poag ?Entered ByDonnamarie Poag on 08/07/2021 13:01:02 ?DRESHON, PROFFIT. ([id number]) ?-------------------------------------------------------------------------------- ?Patient/Caregiver Education Details ?Patient Name: SKILLMAN,

## 2021-08-07 NOTE — Progress Notes (Signed)
[name], [name]. ([id number]) ?Visit Report for 08/07/2021 ?Chief Complaint Document Details ?Patient Name: Christopher, Escobar. ?Date of Service: 08/07/2021 1:00 PM ?Medical Record Number: [id number] ?Patient Account Number: [id number] ?Date of Birth/Sex: 12/22/1955 (66 y.o. M) ?Treating RN: Donnamarie Poag ?Primary Care Provider: Benita Stabile ?Other Clinician: Cornell Barman ?Referring Provider: Benita Stabile ?Treating Provider/Extender: Kalman Shan ?Weeks in Treatment: 6 ?Information Obtained from: Patient ?Chief Complaint ?06/13/2021; Right plantar diabetic foot wound ?Electronic Signature(s) ?Signed: 08/07/2021 2:23:27 PM By: Kalman Shan DO ?Entered By: Kalman Shan on 08/07/2021 14:14:15 ?Christopher, Escobar. ([id number]) ?-------------------------------------------------------------------------------- ?HPI Details ?Patient Name: Christopher, Escobar. ?Date of Service: 08/07/2021 1:00 PM ?Medical Record Number: [id number] ?Patient Account Number: [id number] ?Date of Birth/Sex: [birth year] (66 y.o. M) ?Treating RN: Donnamarie Poag ?Primary Care Provider: Benita Stabile ?Other Clinician: Cornell Barman ?Referring Provider: Benita Stabile ?Treating Provider/Extender: Kalman Shan ?Weeks in Treatment: 6 ?History of Present Illness ?HPI Description: Admission 06/13/2021 ?[name] is a 66 year old male with a past medical history of insulin-dependent type 2 diabetes with current self-reported hemoglobin ?A1c of 6.8, and peripheral neuropathy that presents to the clinic for a 45-month history of nonhealing ulcer to the right plantar foot. He follows with ?Dr. Posey Pronto, podiatry for this issue. He has been using Betadine wet-to-dry dressings. He is not currently using any offloading device. He denies ?systemic signs of infection. ?06/20/2021; patient presents for follow-up. He obtained his x-ray of the right foot. He has not heard from vein and vascular about setting up an ?appointment for his ABIs. He has been using Hydrofera Blue to the  wound bed and using his front offloading shoe. He reports improvement in ?wound healing. ?3/29; patient presents for follow-up. He was originally seen at the [facility] site and has transferred over to the [facility] wound center. I have ?been following him for his diabetic foot wound. He states he received the defender boot and has been using it for the past 3 days without issues. ?He continues to use Hydrofera Blue to the wound bed. He is scheduled for his MRI on 3/31. He denies signs of infection. ?4/5; patient presents for follow-up. He has been using Hydrofera Blue to the wound bed without issues. He denies signs of infection. He has been ?using the Conservation officer, nature. He was scheduled for his MRI on 3/31 however insurance denied coverage. It is unclear why. The x-ray showed ?suspicious osteo to the second medial met head Where the wound is at. He currently denies systemic signs of infection. He had ABIs completed ?that showed a right ABI of 1.27 and right toe pressure of 0.84 with triphasic waveforms at ATA and PTA. ?4/12; right fourth met head diabetic foot wound. Using [facility] has a Conservation officer, nature. Apparently some suspicion of osteomyelitis noted on ?his plain films but he was denied for an MRI. He states he is being religious with use of the defender boot and offloading his foot ?4/19; patient presents for follow-up. He has been using Hydrofera Blue with the Conservation officer, nature. He had his MRI completed. He currently denies ?signs of infection. ?4/26; patient presents for follow-up. He continues to use [facility] without issues. He continues using the Conservation officer, nature he has no issues or ?complaints today. He denies signs of infection. ?5/3; patient presents for follow-up. He has been using Hydrofera Blue to the wound bed without issues. He is using the Conservation officer, nature. He states ?he has a knee scooter. His wife went on a girls trip and he has been on his feet more  often in the past week. He currently denies signs  of ?infection. ?5/10; patient presents for follow-up. He has been using Hydrofera Blue to the wound bed. He has no issues or complaints today. He states he ?would like to try the total contact cast. ?Electronic Signature(s) ?Signed: 08/07/2021 2:23:27 PM By: Kalman Shan DO ?Entered By: Kalman Shan on 08/07/2021 14:14:47 ?Christopher, Escobar. ([id number]) ?-------------------------------------------------------------------------------- ?Physical Exam Details ?Patient Name: Christopher, Escobar. ?Date of Service: 08/07/2021 1:00 PM ?Medical Record Number: [id number] ?Patient Account Number: [id number] ?Date of Birth/Sex: [birth year] (66 y.o. M) ?Treating RN: Donnamarie Poag ?Primary Care Provider: Benita Stabile ?Other Clinician: Cornell Barman ?Referring Provider: Benita Stabile ?Treating Provider/Extender: Kalman Shan ?Weeks in Treatment: 6 ?Constitutional ?. ?Cardiovascular ?Marland Kitchen ?Psychiatric ?Marland Kitchen ?Notes ?Right lower extremity: To the right plantar fourth metatarsal head there is a small circular punched-out wound with granulation tissue. No ?surrounding signs of infection. ?Electronic Signature(s) ?Signed: 08/07/2021 2:23:27 PM By: Kalman Shan DO ?Entered By: Kalman Shan on 08/07/2021 14:17:44 ?Christopher, Escobar. ([id number]) ?-------------------------------------------------------------------------------- ?Physician Orders Details ?Patient Name: Christopher, Escobar. ?Date of Service: 08/07/2021 1:00 PM ?Medical Record Number: [id number] ?Patient Account Number: [id number] ?Date of Birth/Sex: [birth year] (66 y.o. M) ?Treating RN: Donnamarie Poag ?Primary Care Provider: Benita Stabile ?Other Clinician: Cornell Barman ?Referring Provider: Benita Stabile ?Treating Provider/Extender: Kalman Shan ?Weeks in Treatment: 6 ?Verbal / Phone Orders: No ?Diagnosis Coding ?Follow-up Appointments ?o Return Appointment in 1 week. - Cast #2 on 5/12, then keep on Wednesdays ?o Nurse Visit as needed ?Bathing/ Shower/ Hygiene ?o May shower; gently  cleanse wound with antibacterial soap, rinse and pat dry prior to dressing wounds - keep dressing dry or ?change after shower ?o No tub bath. ?Anesthetic (Use 'Patient Medications' Section for Anesthetic Order Entry) ?o Lidocaine applied to wound bed ?Edema Control - Lymphedema / Segmental Compressive Device / Other ?o Elevate leg(s) parallel to the floor when sitting. ?o DO YOUR BEST to sleep in the bed at night. DO NOT sleep in your recliner. Long hours of sitting in a recliner leads to ?swelling of the legs and/or potential wounds on your backside. ?Off-Loading ?o Total Contact Cast to Right Lower Extremity - Pad bony prominence on foot with foam pad-always wear the boot when walking ?o Turn and reposition every 2 hours ?Additional Orders / Instructions ?o Follow Nutritious Diet and Increase Protein Intake - monitor blood sugar to maintain normal level ?Wound Treatment ?Wound #1 - Foot Wound Laterality: Plantar, Right ?Cleanser: Soap and Water 2 x Per Week/30 Days ?Discharge Instructions: Gently cleanse wound with antibacterial soap, rinse and pat dry prior to dressing wounds ?Cleanser: Wound Cleanser 2 x Per Week/30 Days ?Discharge Instructions: Wash your hands with soap and water. Remove old dressing, discard into plastic bag and place into trash. ?Cleanse the wound with Wound Cleanser prior to applying a clean dressing using gauze sponges, not tissues or cotton balls. Do not ?scrub or use excessive force. Pat dry using gauze sponges, not tissue or cotton balls. ?Primary Dressing: Hydrofera Blue Ready Transfer Foam, 2.5x2.5 (in/in) 2 x Per Week/30 Days ?Discharge Instructions: Apply Hydrofera Blue Ready to wound bed as directed ?Secondary Dressing: ABD Pad 5x9 (in/in) 2 x Per Week/30 Days ?Discharge Instructions: Cover with ABD pad ?Secured With: Medipore Tape - 70M Medipore H Soft Cloth Surgical Tape, 2x2 (in/yd) 2 x Per Week/30 Days ?Secured With: [employer] or Non-Sterile 6-ply 4.5x4  (yd/yd) 2 x Per Week/30 Days ?Discharge Instructions: Apply Kerlix as directed ?Electronic Signature(s) ?Signed: 08/07/2021 2:23:27 PM  By: Kalman Shan DO ?Entered By: Kalman Shan on 08/07/2021 14:

## 2021-08-09 ENCOUNTER — Encounter: Payer: Medicare HMO | Admitting: Internal Medicine

## 2021-08-09 DIAGNOSIS — E11621 Type 2 diabetes mellitus with foot ulcer: Secondary | ICD-10-CM | POA: Diagnosis not present

## 2021-08-09 NOTE — Progress Notes (Signed)
RONIEL, HALLORAN. ([id number]) ?Visit Report for 08/09/2021 ?Arrival Information Details ?Patient Name: KYRIAN, STAGE. ?Date of Service: 08/09/2021 8:00 AM ?Medical Record Number: [id number] ?Patient Account Number: [id number] ?Date of Birth/Sex: [birth year] (66 y.o. M) ?Treating RN: Levora Dredge ?Primary Care Winna Golla: Benita Stabile Other Clinician: ?Referring Ermal Brzozowski: Benita Stabile ?Treating Malcomb Gangemi/Extender: Jeri Cos ?Weeks in Treatment: 6 ?Visit Information History Since Last Visit ?Added or deleted any medications: No ?Patient Arrived: Ambulatory ?Any new allergies or adverse reactions: No ?Arrival Time: 08:00 ?Had a fall or experienced change in No ?Accompanied By: self ?activities of daily living that may affect ?Transfer Assistance: None ?risk of falls: ?Patient Identification Verified: Yes ?Hospitalized since last visit: No ?Secondary Verification Process Completed: Yes ?Has Dressing in Place as Prescribed: Yes ?Patient Requires Transmission-Based No ?Has Footwear/Offloading in Place as Prescribed: Yes ?Precautions: ?Right: Total Contact Cast ?Patient Has Alerts: Yes ?Pain Present Now: No ?Patient Alerts: DIABETIC ?AVVS ABI/TBI ?07/03/21 ?R 1.27; L 1.23 ?Electronic Signature(s) ?Signed: 08/09/2021 4:35:33 PM By: Levora Dredge ?Entered By: Levora Dredge on 08/09/2021 08:06:31 ?HADLEY, DETLOFF. ([id number]) ?-------------------------------------------------------------------------------- ?Clinic Level of Care Assessment Details ?Patient Name: ALBIE, ARIZPE. ?Date of Service: 08/09/2021 8:00 AM ?Medical Record Number: [id number] ?Patient Account Number: [id number] ?Date of Birth/Sex: 01/11/1956 (66 y.o. M) ?Treating RN: Levora Dredge ?Primary Care Brennyn Haisley: Benita Stabile Other Clinician: ?Referring Joden Bonsall: Benita Stabile ?Treating Poet Hineman/Extender: Jeri Cos ?Weeks in Treatment: 6 ?Clinic Level of Care Assessment Items ?TOOL 1 Quantity Score ?'[]'$  - Use when EandM and Procedure is performed on INITIAL  visit 0 ?ASSESSMENTS - Nursing Assessment / Reassessment ?'[]'$  - General Physical Exam (combine w/ comprehensive assessment (listed just below) when performed on new ?0 ?pt. evals) ?'[]'$  - 0 ?Comprehensive Assessment (HX, ROS, Risk Assessments, Wounds Hx, etc.) ?ASSESSMENTS - Wound and Skin Assessment / Reassessment ?'[]'$  - Dermatologic / Skin Assessment (not related to wound area) 0 ?ASSESSMENTS - Ostomy and/or Continence Assessment and Care ?'[]'$  - Incontinence Assessment and Management 0 ?'[]'$  - 0 ?Ostomy Care Assessment and Management (repouching, etc.) ?PROCESS - Coordination of Care ?'[]'$  - Simple Patient / Family Education for ongoing care 0 ?'[]'$  - 0 ?Complex (extensive) Patient / Family Education for ongoing care ?'[]'$  - 0 ?Staff obtains Consents, Records, Test Results / Process Orders ?'[]'$  - 0 ?Staff telephones HHA, Nursing Homes / Clarify orders / etc ?'[]'$  - 0 ?Routine Transfer to another Facility (non-emergent condition) ?'[]'$  - 0 ?Routine Hospital Admission (non-emergent condition) ?'[]'$  - 0 ?New Admissions / Biomedical engineer / Ordering NPWT, Apligraf, etc. ?'[]'$  - 0 ?Emergency Hospital Admission (emergent condition) ?PROCESS - Special Needs ?'[]'$  - Pediatric / Minor Patient Management 0 ?'[]'$  - 0 ?Isolation Patient Management ?'[]'$  - 0 ?Hearing / Language / Visual special needs ?'[]'$  - 0 ?Assessment of Community assistance (transportation, D/C planning, etc.) ?'[]'$  - 0 ?Additional assistance / Altered mentation ?'[]'$  - 0 ?Support Surface(s) Assessment (bed, cushion, seat, etc.) ?INTERVENTIONS - Miscellaneous ?'[]'$  - External ear exam 0 ?'[]'$  - 0 ?Patient Transfer (multiple staff / Civil Service fast streamer / Similar devices) ?'[]'$  - 0 ?Simple Staple / Suture removal (25 or less) ?'[]'$  - 0 ?Complex Staple / Suture removal (26 or more) ?'[]'$  - 0 ?Hypo/Hyperglycemic Management (do not check if billed separately) ?'[]'$  - 0 ?Ankle / Brachial Index (ABI) - do not check if billed separately ?Has the patient been seen at the hospital within the last three years:  Yes ?Total Score: 0 ?Level Of Care: ____ ?TRIGO, WINTERBOTTOM. ([id number]) ?Electronic Signature(s) ?Signed: 08/09/2021 4:35:33 PM By: Levora Dredge ?  Entered By: Levora Dredge on 08/09/2021 09:20:29 ?MORDECHAI, MATUSZAK. ([id number]) ?-------------------------------------------------------------------------------- ?Encounter Discharge Information Details ?Patient Name: ARGIE, APPLEGATE. ?Date of Service: 08/09/2021 8:00 AM ?Medical Record Number: [id number] ?Patient Account Number: [id number] ?Date of Birth/Sex: 1956-01-07 (66 y.o. M) ?Treating RN: Levora Dredge ?Primary Care Keiarra Charon: Benita Stabile Other Clinician: ?Referring Aveen Stansel: Benita Stabile ?Treating Lizabeth Fellner/Extender: Jeri Cos ?Weeks in Treatment: 6 ?Encounter Discharge Information Items Post Procedure Vitals ?Discharge Condition: Stable ?Temperature (?F): 97.6 ?Ambulatory Status: Ambulatory ?Pulse (bpm): 66 ?Discharge Destination: Home ?Respiratory Rate (breaths/min): 18 ?Transportation: Other ?Blood Pressure (mmHg): 117/75 ?Accompanied By: self ?Schedule Follow-up Appointment: Yes ?Clinical Summary of Care: Patient Declined ?Electronic Signature(s) ?Signed: 08/09/2021 9:21:26 AM By: Levora Dredge ?Entered By: Levora Dredge on 08/09/2021 09:21:26 ?PANTELIS, ELGERSMA. ([id number]) ?-------------------------------------------------------------------------------- ?Lower Extremity Assessment Details ?Patient Name: CORDAY, WYKA. ?Date of Service: 08/09/2021 8:00 AM ?Medical Record Number: [id number] ?Patient Account Number: [id number] ?Date of Birth/Sex: 23-Jul-1955 (66 y.o. M) ?Treating RN: Levora Dredge ?Primary Care Saundra Gin: Benita Stabile Other Clinician: ?Referring Taleah Bellantoni: Benita Stabile ?Treating Amadi Frady/Extender: Jeri Cos ?Weeks in Treatment: 6 ?Edema Assessment ?Assessed: [Left: No] [Right: No] ?[Left: Edema] [Right: :] ?Calf ?Left: Right: ?Point of Measurement: 38 cm From Medial Instep 39 cm ?Ankle ?Left: Right: ?Point of Measurement: 12 cm From  Medial Instep 21 cm ?Vascular Assessment ?Pulses: ?Dorsalis Pedis ?Palpable: [Right:Yes] ?Electronic Signature(s) ?Signed: 08/09/2021 4:35:33 PM By: Levora Dredge ?Entered By: Levora Dredge on 08/09/2021 08:24:38 ?PAYMON, ROSENSTEEL. ([id number]) ?-------------------------------------------------------------------------------- ?Multi Wound Chart Details ?Patient Name: RUFFIN, LADA. ?Date of Service: 08/09/2021 8:00 AM ?Medical Record Number: [id number] ?Patient Account Number: [id number] ?Date of Birth/Sex: 1955/10/19 (66 y.o. M) ?Treating RN: Levora Dredge ?Primary Care Maliq Pilley: Benita Stabile Other Clinician: ?Referring Aquila Delaughter: Benita Stabile ?Treating Damary Doland/Extender: Jeri Cos ?Weeks in Treatment: 6 ?Vital Signs ?Height(in): 71 ?Pulse(bpm): 66 ?Weight(lbs): 254 ?Blood Pressure(mmHg): 117/75 ?Body Mass Index(BMI): 35.4 ?Temperature(??F): 97.6 ?Respiratory Rate(breaths/min): 18 ?Photos: [N/A:N/A] ?Wound Location: Right, Plantar Foot Right Foot N/A ?Wounding Event: Blister Trauma N/A ?Primary Etiology: Diabetic Wound/Ulcer of the Lower Trauma, Other N/A ?Extremity ?Comorbid History: Sleep Apnea, Hypertension, Type II Sleep Apnea, Hypertension, Type II N/A ?Diabetes, History of pressure Diabetes, History of pressure ?wounds, Osteomyelitis, Neuropathy wounds, Osteomyelitis, Neuropathy ?Date Acquired: 03/14/2021 08/09/2021 N/A ?Weeks of Treatment: 6 0 N/A ?Wound Status: Open Open N/A ?Wound Recurrence: No No N/A ?Measurements L x W x D (cm) 0.2x0.3x0.2 1x0.1x0.1 N/A ?Area (cm?) : 0.047 0.079 N/A ?Volume (cm?) : 0.009 0.008 N/A ?% Reduction in Area: 94.60% N/A N/A ?% Reduction in Volume: 97.90% N/A N/A ?Classification: Grade 2 Partial Thickness N/A ?Exudate Amount: Medium None Present N/A ?Exudate Type: Serosanguineous N/A N/A ?Exudate Color: red, brown N/A N/A ?Wound Margin: Thickened N/A N/A ?Granulation Amount: Large (67-100%) Large (67-100%) N/A ?Granulation Quality: Red, Pink Red N/A ?Necrotic Amount: Small  (1-33%) None Present (0%) N/A ?Exposed Structures: ?Fat Layer (Subcutaneous Tissue): N/A N/A ?Yes ?Fascia: No ?Tendon: No ?Muscle: No ?Joint: No ?Bone: No ?Epithelialization: Medium (34-66%) None N/A ?Treatment

## 2021-08-09 NOTE — Progress Notes (Addendum)
Christopher Escobar, Christopher Escobar. ([id number]) ?Visit Report for 08/09/2021 ?Chief Complaint Document Details ?Patient Name: Christopher Escobar, Christopher Escobar. ?Date of Service: 08/09/2021 8:00 AM ?Medical Record Number: [id number] ?Patient Account Number: [id number] ?Date of Birth/Sex: [birth year] (66 y.o. M) ?Treating RN: Levora Dredge ?Primary Care Provider: Benita Stabile Other Clinician: ?Referring Provider: Benita Stabile ?Treating Provider/Extender: Jeri Cos ?Weeks in Treatment: 6 ?Information Obtained from: Patient ?Chief Complaint ?06/13/2021; Right plantar diabetic foot wound ?Electronic Signature(s) ?Signed: 08/09/2021 8:18:01 AM By: Worthy Keeler PA-C ?Entered By: Worthy Keeler on 08/09/2021 08:18:01 ?Christopher Escobar, Christopher Escobar. ([id number]) ?-------------------------------------------------------------------------------- ?Debridement Details ?Patient Name: Christopher Escobar, Christopher Escobar. ?Date of Service: 08/09/2021 8:00 AM ?Medical Record Number: [id number] ?Patient Account Number: [id number] ?Date of Birth/Sex: Aug 19, 1955 (66 y.o. M) ?Treating RN: Levora Dredge ?Primary Care Provider: Benita Stabile Other Clinician: ?Referring Provider: Benita Stabile ?Treating Provider/Extender: Jeri Cos ?Weeks in Treatment: 6 ?Debridement Performed for ?Wound #1 Right,Plantar Foot ?Assessment: ?Performed By: Physician Tommie Sams., PA-C ?Debridement Type: Debridement ?Severity of Tissue Pre Debridement: Fat layer exposed ?Level of Consciousness (Pre- ?Awake and Alert ?procedure): ?Pre-procedure Verification/Time Out ?Yes - 08:35 ?Taken: ?Total Area Debrided (L x W): 0.2 (cm) x 0.3 (cm) = 0.06 (cm?) ?Tissue and other material ?Viable, Non-Viable, Callus, Slough, Subcutaneous, [place] ?debrided: ?Level: Skin/Subcutaneous Tissue ?Debridement Description: Excisional ?Instrument: Curette ?Bleeding: Minimum ?Hemostasis Achieved: Pressure ?Response to Treatment: Procedure was tolerated well ?Level of Consciousness (Post- ?Awake and Alert ?procedure): ?Post Debridement Measurements of  Total Wound ?Length: (cm) 0.2 ?Width: (cm) 0.3 ?Depth: (cm) 0.2 ?Volume: (cm?) 0.009 ?Character of Wound/Ulcer Post Debridement: Stable ?Severity of Tissue Post Debridement: Fat layer exposed ?Post Procedure Diagnosis ?Same as Pre-procedure ?Electronic Signature(s) ?Signed: 08/09/2021 4:35:33 PM By: Levora Dredge ?Signed: 08/09/2021 6:04:02 PM By: Worthy Keeler PA-C ?Entered By: Levora Dredge on 08/09/2021 08:39:03 ?Christopher Escobar, Christopher Escobar. ([id number]) ?-------------------------------------------------------------------------------- ?HPI Details ?Patient Name: Christopher Escobar, Christopher Escobar. ?Date of Service: 08/09/2021 8:00 AM ?Medical Record Number: [id number] ?Patient Account Number: [id number] ?Date of Birth/Sex: [birth year] (66 y.o. M) ?Treating RN: Levora Dredge ?Primary Care Provider: Benita Stabile Other Clinician: ?Referring Provider: Benita Stabile ?Treating Provider/Extender: Jeri Cos ?Weeks in Treatment: 6 ?History of Present Illness ?HPI Description: Admission 06/13/2021 ?Christopher Escobar is a 66 year old male with a past medical history of insulin-dependent type 2 diabetes with current self-reported hemoglobin ?A1c of 6.8, and peripheral neuropathy that presents to the clinic for a 85-monthhistory of nonhealing ulcer to the right plantar foot. He follows with ?Dr. PPosey Pronto podiatry for this issue. He has been using Betadine wet-to-dry dressings. He is not currently using any offloading device. He denies ?systemic signs of infection. ?06/20/2021; patient presents for follow-up. He obtained his x-ray of the right foot. He has not heard from vein and vascular about setting up an ?appointment for his ABIs. He has been using Hydrofera Blue to the wound bed and using his front offloading shoe. He reports improvement in ?wound healing. ?3/29; patient presents for follow-up. [facility] I have ?been following him for his diabetic foot wound. He  states he received the defender boot and has been using it for the past 3 days without issues. ?He continues to use Hydrofera Blue to the wound bed. He is scheduled for his MRI on 3/31. He denies signs of infection. ?4/5; patient presents for follow-up. He has been using Hydrofera Blue to the wound bed without issues. He denies signs of infection. He has been ?using the dConservation officer, nature He  was scheduled for his MRI on 3/31 however insurance denied coverage. It is unclear why. The x-ray showed ?suspicious osteo to the second medial met head Where the wound is at. He currently denies systemic signs of infection. He had ABIs completed ?that showed a right ABI of 1.27 and right toe pressure of 0.84 with triphasic waveforms at ATA and PTA. ?4/12; right fourth met head diabetic foot wound. Using [facility] has a Conservation officer, nature. Apparently some suspicion of osteomyelitis noted on ?his plain films but he was denied for an MRI. He states he is being religious with use of the defender boot and offloading his foot ?4/19; patient presents for follow-up. He has been using Hydrofera Blue with the Conservation officer, nature. He had his MRI completed. He currently denies ?signs of infection. ?4/26; patient presents for follow-up. He continues to use [facility] without issues. He continues using the Conservation officer, nature he has no issues or ?complaints today. He denies signs of infection. ?5/3; patient presents for follow-up. He has been using Hydrofera Blue to the wound bed without issues. He is using the Conservation officer, nature. He states ?he has a knee scooter. His wife went on a girls trip and he has been on his feet more often in the past week. He currently denies signs of ?infection. ?5/10; patient presents for follow-up. He has been using Hydrofera Blue to the wound bed. He has no issues or complaints today. He states he ?would like to try the total contact cast. ?08-09-2021 upon evaluation today patient actually appears to be doing decently well in  regard to the wound on his right plantar foot. Fortunately ?there does not appear to be any evidence of infection which is great news and overall very pleased with where we stand I think there is a little bit ?of callus and when to try to trim that away today. Also there is little bit of slough and biofilm noted on the surface of the wound. Overall however I ?think that this is looking well after just 2 days there is a significant improvement already. This is much smaller. Unfortunately the padding on the ?plantar aspect of the heel had squished up towards the end of his toes and when we were removing the cast with a cast saw it did actually nicked ?his skin along the heel. With that being said this is extremely superficial and more like an abrasion or scratch than anything and overall I think it ?is going to heal by the time he has a cast change on Wednesday. This was discussed with the patient however and he is aware. ?Electronic Signature(s) ?Signed: 08/09/2021 5:47:40 PM By: Worthy Keeler PA-C ?Previous Signature: 08/09/2021 8:58:41 AM Version By: Worthy Keeler PA-C ?Previous Signature: 08/09/2021 8:57:03 AM Version By: Worthy Keeler PA-C ?Entered By: Worthy Keeler on 08/09/2021 17:47:40 ?Christopher Escobar, Christopher Escobar. ([id number]) ?-------------------------------------------------------------------------------- ?Physical Exam Details ?Patient Name: Christopher Escobar, Christopher Escobar. ?Date of Service: 08/09/2021 8:00 AM ?Medical Record Number: [id number] ?Patient Account Number: [id number] ?Date of Birth/Sex: Aug 05, 1955 (66 y.o. M) ?Treating RN: Levora Dredge ?Primary Care Provider: Benita Stabile Other Clinician: ?Referring Provider: Benita Stabile ?Treating Provider/Extender: Jeri Cos ?Weeks in Treatment: 6 ?Constitutional ?Well-nourished and well-hydrated in no acute distress. ?Respiratory ?normal breathing without difficulty. ?Psychiatric ?this patient is able to make decisions and demonstrates good insight into disease process. Alert  and Oriented x 3. pleasant and cooperative. ?Notes ?Upon inspection patient's wound bed actually showed signs of good granulation and epithelization at this point. I did perform  sharp debridement ?to clea

## 2021-08-14 ENCOUNTER — Encounter (HOSPITAL_BASED_OUTPATIENT_CLINIC_OR_DEPARTMENT_OTHER): Payer: Medicare HMO | Admitting: Internal Medicine

## 2021-08-14 DIAGNOSIS — L97512 Non-pressure chronic ulcer of other part of right foot with fat layer exposed: Secondary | ICD-10-CM

## 2021-08-14 DIAGNOSIS — E11621 Type 2 diabetes mellitus with foot ulcer: Secondary | ICD-10-CM | POA: Diagnosis not present

## 2021-08-14 NOTE — Progress Notes (Signed)
Christopher, Escobar. ([id number]) ?Visit Report for 08/14/2021 ?Arrival Information Details ?Patient Name: Christopher Escobar, Christopher Escobar. ?Date of Service: 08/14/2021 1:00 PM ?Medical Record Number: [id number] ?Patient Account Number: [id number] ?Date of Birth/Sex: April 02, 1955 (66 y.o. M) ?Treating RN: Donnamarie Poag ?Primary Care Provider: Benita Stabile Other Clinician: ?Referring Provider: Benita Stabile ?Treating Provider/Extender: Kalman Shan ?Weeks in Treatment: 7 ?Visit Information History Since Last Visit ?Added or deleted any medications: No ?Patient Arrived: Ambulatory ?Any new allergies or adverse reactions: No ?Arrival Time: 13:05 ?Had a fall or experienced change in No ?Accompanied By: self ?activities of daily living that may affect ?Transfer Assistance: None ?risk of falls: ?Patient Identification Verified: Yes ?Signs or symptoms of abuse/neglect since last visito No ?Secondary Verification Process Completed: Yes ?Hospitalized since last visit: No ?Patient Requires Transmission-Based No ?Has Dressing in Place as Prescribed: Yes ?Precautions: ?Has Footwear/Offloading in Place as Prescribed: Yes ?Patient Has Alerts: Yes ?Right: Total Contact Cast ?Patient Alerts: DIABETIC ?Pain Present Now: No ?AVVS ABI/TBI ?07/03/21 ?R 1.27; L 1.23 ?Electronic Signature(s) ?Signed: 08/14/2021 1:53:21 PM By: Donnamarie Poag ?Entered ByDonnamarie Poag on 08/14/2021 13:14:22 ?ZINEDINE, ELLNER. ([id number]) ?-------------------------------------------------------------------------------- ?Encounter Discharge Information Details ?Patient Name: Christopher Escobar, Christopher Escobar. ?Date of Service: 08/14/2021 1:00 PM ?Medical Record Number: [id number] ?Patient Account Number: [id number] ?Date of Birth/Sex: February 25, 1956 (66 y.o. M) ?Treating RN: Donnamarie Poag ?Primary Care Provider: Benita Stabile Other Clinician: ?Referring Provider: Benita Stabile ?Treating Provider/Extender: Kalman Shan ?Weeks in Treatment: 7 ?Encounter Discharge Information Items ?Discharge Condition:  Stable ?Ambulatory Status: Ambulatory ?Discharge Destination: Home ?Transportation: Private Auto ?Accompanied By: self ?Schedule Follow-up Appointment: Yes ?Clinical Summary of Care: ?Electronic Signature(s) ?Signed: 08/14/2021 1:53:21 PM By: Donnamarie Poag ?Entered ByDonnamarie Poag on 08/14/2021 13:51:58 ?BAIRD, POLINSKI. ([id number]) ?-------------------------------------------------------------------------------- ?Lower Extremity Assessment Details ?Patient Name: Christopher, Escobar. ?Date of Service: 08/14/2021 1:00 PM ?Medical Record Number: [id number] ?Patient Account Number: [id number] ?Date of Birth/Sex: 08/18/1955 (66 y.o. M) ?Treating RN: Donnamarie Poag ?Primary Care Provider: Benita Stabile Other Clinician: ?Referring Provider: Benita Stabile ?Treating Provider/Extender: Kalman Shan ?Weeks in Treatment: 7 ?Edema Assessment ?Assessed: [Left: No] [Right: Yes] ?Edema: [Left: N] [Right: o] ?Ankle ?Left: Right: ?Point of Measurement: 12 cm From Medial Instep 21.5 cm ?Vascular Assessment ?Pulses: ?Dorsalis Pedis ?Palpable: [Right:Yes] ?Electronic Signature(s) ?Signed: 08/14/2021 1:53:21 PM By: Donnamarie Poag ?Entered ByDonnamarie Poag on 08/14/2021 13:19:24 ?KWABENA, STRUTZ. ([id number]) ?-------------------------------------------------------------------------------- ?Multi Wound Chart Details ?Patient Name: Christopher, Escobar. ?Date of Service: 08/14/2021 1:00 PM ?Medical Record Number: [id number] ?Patient Account Number: [id number] ?Date of Birth/Sex: 02-Nov-1955 (66 y.o. M) ?Treating RN: Donnamarie Poag ?Primary Care Provider: Benita Stabile Other Clinician: ?Referring Provider: Benita Stabile ?Treating Provider/Extender: Kalman Shan ?Weeks in Treatment: 7 ?Vital Signs ?Height(in): 71 ?Pulse(bpm): 93 ?Weight(lbs): 254 ?Blood Pressure(mmHg): 120/76 ?Body Mass Index(BMI): 35.4 ?Temperature(??F): 98.0 ?Respiratory Rate(breaths/min): 18 ?Photos: [N/A:N/A] ?Wound Location: Right, Plantar Foot Right Foot N/A ?Wounding Event: Blister Trauma  N/A ?Primary Etiology: Diabetic Wound/Ulcer of the Lower Trauma, Other N/A ?Extremity ?Comorbid History: Sleep Apnea, Hypertension, Type II Sleep Apnea, Hypertension, Type II N/A ?Diabetes, History of pressure Diabetes, History of pressure ?wounds, Osteomyelitis, Neuropathy wounds, Osteomyelitis, Neuropathy ?Date Acquired: 03/14/2021 08/09/2021 N/A ?Weeks of Treatment: 7 0 N/A ?Wound Status: Open Healed - Epithelialized N/A ?Wound Recurrence: No No N/A ?Measurements L x W x D (cm) 0.1x0.1x0.1 0x0x0 N/A ?Area (cm?) : 0.008 0 N/A ?Volume (cm?) : 0.001 0 N/A ?% Reduction in Area: 99.10% 100.00% N/A ?% Reduction in Volume: 99.80% 100.00% N/A ?Classification: Grade 2 Partial Thickness N/A ?Exudate Amount: Medium None Present N/A ?  Christopher, Escobar. ([id number]) ?Visit Report for 08/14/2021 ?Arrival Information Details ?Patient Name: Christopher Escobar, Christopher Escobar. ?Date of Service: 08/14/2021 1:00 PM ?Medical Record Number: [id number] ?Patient Account Number: [id number] ?Date of Birth/Sex: April 02, 1955 (66 y.o. M) ?Treating RN: Donnamarie Poag ?Primary Care Pietra Zuluaga: Benita Stabile Other Clinician: ?Referring Rayma Hegg: Benita Stabile ?Treating Ha Shannahan/Extender: Kalman Shan ?Weeks in Treatment: 7 ?Visit Information History Since Last Visit ?Added or deleted any medications: No ?Patient Arrived: Ambulatory ?Any new allergies or adverse reactions: No ?Arrival Time: 13:05 ?Had a fall or experienced change in No ?Accompanied By: self ?activities of daily living that may affect ?Transfer Assistance: None ?risk of falls: ?Patient Identification Verified: Yes ?Signs or symptoms of abuse/neglect since last visito No ?Secondary Verification Process Completed: Yes ?Hospitalized since last visit: No ?Patient Requires Transmission-Based No ?Has Dressing in Place as Prescribed: Yes ?Precautions: ?Has Footwear/Offloading in Place as Prescribed: Yes ?Patient Has Alerts: Yes ?Right: Total Contact Cast ?Patient Alerts: DIABETIC ?Pain Present Now: No ?AVVS ABI/TBI ?07/03/21 ?R 1.27; L 1.23 ?Electronic Signature(s) ?Signed: 08/14/2021 1:53:21 PM By: Donnamarie Poag ?Entered ByDonnamarie Poag on 08/14/2021 13:14:22 ?ZINEDINE, ELLNER. ([id number]) ?-------------------------------------------------------------------------------- ?Encounter Discharge Information Details ?Patient Name: Christopher Escobar, Christopher Escobar. ?Date of Service: 08/14/2021 1:00 PM ?Medical Record Number: [id number] ?Patient Account Number: [id number] ?Date of Birth/Sex: February 25, 1956 (66 y.o. M) ?Treating RN: Donnamarie Poag ?Primary Care Alina Gilkey: Benita Stabile Other Clinician: ?Referring Adarryl Goldammer: Benita Stabile ?Treating Leiah Giannotti/Extender: Kalman Shan ?Weeks in Treatment: 7 ?Encounter Discharge Information Items ?Discharge Condition:  Stable ?Ambulatory Status: Ambulatory ?Discharge Destination: Home ?Transportation: Private Auto ?Accompanied By: self ?Schedule Follow-up Appointment: Yes ?Clinical Summary of Care: ?Electronic Signature(s) ?Signed: 08/14/2021 1:53:21 PM By: Donnamarie Poag ?Entered ByDonnamarie Poag on 08/14/2021 13:51:58 ?BAIRD, POLINSKI. ([id number]) ?-------------------------------------------------------------------------------- ?Lower Extremity Assessment Details ?Patient Name: Christopher, Escobar. ?Date of Service: 08/14/2021 1:00 PM ?Medical Record Number: [id number] ?Patient Account Number: [id number] ?Date of Birth/Sex: 08/18/1955 (66 y.o. M) ?Treating RN: Donnamarie Poag ?Primary Care Saraann Enneking: Benita Stabile Other Clinician: ?Referring Kensleigh Gates: Benita Stabile ?Treating Susane Bey/Extender: Kalman Shan ?Weeks in Treatment: 7 ?Edema Assessment ?Assessed: [Left: No] [Right: Yes] ?Edema: [Left: N] [Right: o] ?Ankle ?Left: Right: ?Point of Measurement: 12 cm From Medial Instep 21.5 cm ?Vascular Assessment ?Pulses: ?Dorsalis Pedis ?Palpable: [Right:Yes] ?Electronic Signature(s) ?Signed: 08/14/2021 1:53:21 PM By: Donnamarie Poag ?Entered ByDonnamarie Poag on 08/14/2021 13:19:24 ?KWABENA, STRUTZ. ([id number]) ?-------------------------------------------------------------------------------- ?Multi Wound Chart Details ?Patient Name: Christopher, Escobar. ?Date of Service: 08/14/2021 1:00 PM ?Medical Record Number: [id number] ?Patient Account Number: [id number] ?Date of Birth/Sex: 02-Nov-1955 (66 y.o. M) ?Treating RN: Donnamarie Poag ?Primary Care Kenecia Barren: Benita Stabile Other Clinician: ?Referring Tracy Gerken: Benita Stabile ?Treating Alycia Cooperwood/Extender: Kalman Shan ?Weeks in Treatment: 7 ?Vital Signs ?Height(in): 71 ?Pulse(bpm): 93 ?Weight(lbs): 254 ?Blood Pressure(mmHg): 120/76 ?Body Mass Index(BMI): 35.4 ?Temperature(??F): 98.0 ?Respiratory Rate(breaths/min): 18 ?Photos: [N/A:N/A] ?Wound Location: Right, Plantar Foot Right Foot N/A ?Wounding Event: Blister Trauma  N/A ?Primary Etiology: Diabetic Wound/Ulcer of the Lower Trauma, Other N/A ?Extremity ?Comorbid History: Sleep Apnea, Hypertension, Type II Sleep Apnea, Hypertension, Type II N/A ?Diabetes, History of pressure Diabetes, History of pressure ?wounds, Osteomyelitis, Neuropathy wounds, Osteomyelitis, Neuropathy ?Date Acquired: 03/14/2021 08/09/2021 N/A ?Weeks of Treatment: 7 0 N/A ?Wound Status: Open Healed - Epithelialized N/A ?Wound Recurrence: No No N/A ?Measurements L x W x D (cm) 0.1x0.1x0.1 0x0x0 N/A ?Area (cm?) : 0.008 0 N/A ?Volume (cm?) : 0.001 0 N/A ?% Reduction in Area: 99.10% 100.00% N/A ?% Reduction in Volume: 99.80% 100.00% N/A ?Classification: Grade 2 Partial Thickness N/A ?Exudate Amount: Medium None Present N/A ?

## 2021-08-14 NOTE — Progress Notes (Signed)
AKAI, DOLLARD. ([id number]) ?Visit Report for 08/14/2021 ?Chief Complaint Document Details ?Patient Name: ZIGMUND, LINSE. ?Date of Service: 08/14/2021 1:00 PM ?Medical Record Number: [id number] ?Patient Account Number: [id number] ?Date of Birth/Sex: 1955/07/03 (65 y.o. M) ?Treating RN: Donnamarie Poag ?Primary Care Provider: Benita Stabile Other Clinician: ?Referring Provider: Benita Stabile ?Treating Provider/Extender: Kalman Shan ?Weeks in Treatment: 7 ?Information Obtained from: Patient ?Chief Complaint ?06/13/2021; Right plantar diabetic foot wound ?Electronic Signature(s) ?Signed: 08/14/2021 2:45:23 PM By: Kalman Shan DO ?Entered By: Kalman Shan on 08/14/2021 13:40:11 ?ALECXANDER, MAINWARING. ([id number]) ?-------------------------------------------------------------------------------- ?HPI Details ?Patient Name: REI, MEDLEN. ?Date of Service: 08/14/2021 1:00 PM ?Medical Record Number: [id number] ?Patient Account Number: [id number] ?Date of Birth/Sex: 12/23/1955 (66 y.o. M) ?Treating RN: Donnamarie Poag ?Primary Care Provider: Benita Stabile Other Clinician: ?Referring Provider: Benita Stabile ?Treating Provider/Extender: Kalman Shan ?Weeks in Treatment: 7 ?History of Present Illness ?HPI Description: Admission 06/13/2021 ?Mr. Zymeir Salminen is a 66 year old male with a past medical history of insulin-dependent type 2 diabetes with current self-reported hemoglobin ?A1c of 6.8, and peripheral neuropathy that presents to the clinic for a 46-month history of nonhealing ulcer to the right plantar foot. He follows with ?Dr. Posey Pronto, podiatry for this issue. He has been using Betadine wet-to-dry dressings. He is not currently using any offloading device. He denies ?systemic signs of infection. ?06/20/2021; patient presents for follow-up. He obtained his x-ray of the right foot. He has not heard from vein and vascular about setting up an ?appointment for his ABIs. He has been using Hydrofera Blue to the wound bed and using his  front offloading shoe. He reports improvement in ?wound healing. ?3/29; patient presents for follow-up. He was originally seen at the [facility] site and has transferred over to the [place] wound center. I have ?been following him for his diabetic foot wound. He states he received the defender boot and has been using it for the past 3 days without issues. ?He continues to use Hydrofera Blue to the wound bed. He is scheduled for his MRI on 3/31. He denies signs of infection. ?4/5; patient presents for follow-up. He has been using Hydrofera Blue to the wound bed without issues. He denies signs of infection. He has been ?using the Conservation officer, nature. He was scheduled for his MRI on 3/31 however insurance denied coverage. It is unclear why. The x-ray showed ?suspicious osteo to the second medial met head Where the wound is at. He currently denies systemic signs of infection. He had ABIs completed ?that showed a right ABI of 1.27 and right toe pressure of 0.84 with triphasic waveforms at ATA and PTA. ?4/12; right fourth met head diabetic foot wound. Using [facility] has a Conservation officer, nature. Apparently some suspicion of osteomyelitis noted on ?his plain films but he was denied for an MRI. He states he is being religious with use of the defender boot and offloading his foot ?4/19; patient presents for follow-up. He has been using Hydrofera Blue with the Conservation officer, nature. He had his MRI completed. He currently denies ?signs of infection. ?4/26; patient presents for follow-up. He continues to use [facility] without issues. He continues using the Conservation officer, nature he has no issues or ?complaints today. He denies signs of infection. ?5/3; patient presents for follow-up. He has been using Hydrofera Blue to the wound bed without issues. He is using the Conservation officer, nature. He states ?he has a knee scooter. His wife went on a girls trip and he has been on his feet more often in the past  week. He currently denies signs of ?infection. ?5/10;  patient presents for follow-up. He has been using Hydrofera Blue to the wound bed. He has no issues or complaints today. He states he ?would like to try the total contact cast. ?08-09-2021 upon evaluation today patient actually appears to be doing decently well in regard to the wound on his right plantar foot. Fortunately ?there does not appear to be any evidence of infection which is great news and overall very pleased with where we stand I think there is a little bit ?of callus and when to try to trim that away today. Also there is little bit of slough and biofilm noted on the surface of the wound. Overall however I ?think that this is looking well after just 2 days there is a significant improvement already. This is much smaller. Unfortunately the padding on the ?plantar aspect of the heel had squished up towards the end of his toes and when we were removing the cast with a cast saw it did actually nicked ?his skin along the heel. With that being said this is extremely superficial and more like an abrasion or scratch than anything and overall I think it ?is going to heal by the time he has a cast change on Wednesday. This was discussed with the patient however and he is aware. ?5/17; patient presents for follow-up. He tolerated the cast well. He has no issues or complaints today. ?Electronic Signature(s) ?Signed: 08/14/2021 2:45:23 PM By: Kalman Shan DO ?Entered By: Kalman Shan on 08/14/2021 13:48:44 ?TREBOR, GALDAMEZ. ([id number]) ?-------------------------------------------------------------------------------- ?Physical Exam Details ?Patient Name: BRITAIN, SABER. ?Date of Service: 08/14/2021 1:00 PM ?Medical Record Number: [id number] ?Patient Account Number: [id number] ?Date of Birth/Sex: 1956/03/14 (66 y.o. M) ?Treating RN: Donnamarie Poag ?Primary Care Provider: Benita Stabile Other Clinician: ?Referring Provider: Benita Stabile ?Treating Provider/Extender: Kalman Shan ?Weeks in Treatment:  7 ?Constitutional ?. ?Cardiovascular ?Marland Kitchen ?Psychiatric ?Marland Kitchen ?Notes ?Right foot: To the plantar aspect there is a pinpoint open wound present. No signs of infection. To the heel there is a small crack. ?Electronic Signature(s) ?Signed: 08/14/2021 2:45:23 PM By: Kalman Shan DO ?Entered By: Kalman Shan on 08/14/2021 13:49:50 ?CHAZ, MCGLASSON. ([id number]) ?-------------------------------------------------------------------------------- ?Physician Orders Details ?Patient Name: JEMEL, ONO. ?Date of Service: 08/14/2021 1:00 PM ?Medical Record Number: [id number] ?Patient Account Number: [id number] ?Date of Birth/Sex: [birth year] (66 y.o. 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 - Cast #3 on 5/17, keep on Wednesdays ?o Nurse Visit as needed ?Bathing/ Shower/ Hygiene ?o May shower; gently cleanse wound with antibacterial soap, rinse and pat dry prior to dressing wounds - keep dressing dry or ?change after shower ?o No tub bath. ?Anesthetic (Use 'Patient Medications' Section for Anesthetic Order Entry) ?o Lidocaine applied to wound bed ?Edema Control - Lymphedema / Segmental Compressive Device / Other ?o Elevate leg(s) parallel to the floor when sitting. ?o DO YOUR BEST to sleep in the bed at night. DO NOT sleep in your recliner. Long hours of sitting in a recliner leads to ?swelling of the legs and/or potential wounds on your backside. ?Off-Loading ?o Total Contact Cast to Right Lower Extremity -  Pad bony prominence on foot with foam pad-always  wear the boot when walking ?o Turn and reposition every 2 hours ?Additional Orders / Instructions ?o Follow Nutritious Diet and Increase Protein Intake - monitor blood sugar to maintain normal level ?Wound Treatment

## 2021-08-15 ENCOUNTER — Encounter: Payer: Medicare HMO | Admitting: Physician Assistant

## 2021-08-15 DIAGNOSIS — E11621 Type 2 diabetes mellitus with foot ulcer: Secondary | ICD-10-CM | POA: Diagnosis not present

## 2021-08-15 NOTE — Progress Notes (Addendum)
Christopher, Escobar ([id number]) Visit Report for 08/15/2021 Chief Complaint Document Details Patient Name: Christopher Escobar, Christopher Escobar. Date of Service: 08/15/2021 9:45 AM Medical Record Number: [id number] Patient Account Number: [id number] Date of Birth/Sex: 1955/12/18 (66 y.o. M) Treating RN: Donnamarie Poag Primary Care Provider: Benita Stabile Other Clinician: Referring Provider: Benita Stabile Treating Provider/Extender: Skipper Cliche in Treatment: 7 Information Obtained from: Patient Chief Complaint 06/13/2021; Right plantar diabetic foot wound Electronic Signature(s) Signed: 08/15/2021 9:57:22 AM By: Worthy Keeler PA-C Entered By: Worthy Keeler on 08/15/2021 09:57:21 BENOIT, MEECH ([id number]) -------------------------------------------------------------------------------- HPI Details Patient Name: Christopher, Escobar. Date of Service: 08/15/2021 9:45 AM Medical Record Number: [id number] Patient Account Number: [id number] Date of Birth/Sex: 1955/06/11 (65 y.o. M) Treating RN: Donnamarie Poag Primary Care Provider: Benita Stabile Other Clinician: Referring Provider: Benita Stabile Treating Provider/Extender: Skipper Cliche in Treatment: 7 History of Present Illness HPI Description: Admission 06/13/2021 [name] is a 66 year old male with a past medical history of insulin-dependent type 2 diabetes with current self-reported hemoglobin A1c of 6.8, and peripheral neuropathy that presents to the clinic for a 85-month history of nonhealing ulcer to the right plantar foot. He follows with Dr. Posey Pronto, podiatry for this issue. He has been using Betadine wet-to-dry dressings. He is not currently using any offloading device. He denies systemic signs of infection. 06/20/2021; patient presents for follow-up. He obtained his x-ray of the right foot. He has not heard from vein and vascular about setting up an appointment for his ABIs. He has been using Hydrofera Blue to the wound bed and using his front  offloading shoe. He reports improvement in wound healing. 3/29; patient presents for follow-up. He was originally seen at the [facility] site and has transferred over to the [facility] wound center. I have been following him for his diabetic foot wound. He states he received the defender boot and has been using it for the past 3 days without issues. He continues to use Hydrofera Blue to the wound bed. He is scheduled for his MRI on 3/31. He denies signs of infection. 4/5; patient presents for follow-up. He has been using Hydrofera Blue to the wound bed without issues. He denies signs of infection. He has been using the Conservation officer, nature. He was scheduled for his MRI on 3/31 however insurance denied coverage. It is unclear why. The x-ray showed suspicious osteo to the second medial met head Where the wound is at. He currently denies systemic signs of infection. He had ABIs completed that showed a right ABI of 1.27 and right toe pressure of 0.84 with triphasic waveforms at ATA and PTA. 4/12; right fourth met head diabetic foot wound. Using [facility] has a Conservation officer, nature. Apparently some suspicion of osteomyelitis noted on his plain films but he was denied for an MRI. He states he is being religious with use of the defender boot and offloading his foot 4/19; patient presents for follow-up. He has been using Hydrofera Blue with the Conservation officer, nature. He had his MRI completed. He currently denies signs of infection. 4/26; patient presents for follow-up. He continues to use [facility] without issues. He continues using the Conservation officer, nature he has no issues or complaints today. He denies signs of infection. 5/3; patient presents for follow-up. He has been using Hydrofera Blue to the wound bed without issues. He is using the Conservation officer, nature. He states he has a knee scooter. His wife went on a girls trip and he has been on his feet more often in  the past week. He currently denies signs of infection. 5/10; patient  presents for follow-up. He has been using Hydrofera Blue to the wound bed. He has no issues or complaints today. He states he would like to try the total contact cast. 08-09-2021 upon evaluation today patient actually appears to be doing decently well in regard to the wound on his right plantar foot. Fortunately there does not appear to be any evidence of infection which is great news and overall very pleased with where we stand I think there is a little bit of callus and when to try to trim that away today. Also there is little bit of slough and biofilm noted on the surface of the wound. Overall however I think that this is looking well after just 2 days there is a significant improvement already. This is much smaller. Unfortunately the padding on the plantar aspect of the heel had squished up towards the end of his toes and when we were removing the cast with a cast saw it did actually nicked his skin along the heel. With that being said this is extremely superficial and more like an abrasion or scratch than anything and overall I think it is going to heal by the time he has a cast change on Wednesday. This was discussed with the patient however and he is aware. 5/17; patient presents for follow-up. He tolerated the cast well. He has no issues or complaints today. 08-15-2021 upon evaluation today patient appears to be doing well with regard to his wound in fact this appears to be completely healed. He is here today because yesterday during his visit he had a cast reapplied there was a debate on whether that was needed or not they opted to go for 1 more week. Nonetheless he is having trouble with the cast sliding and not staying put which I think is the part of the issue he had before. Nonetheless I do believe that he is doing well I think the wound is healed and honestly I do not think he needs the cast any longer anyway. Electronic Signature(s) Signed: 08/15/2021 10:37:28 AM By: Worthy Keeler  PA-C Entered By: Worthy Keeler on 08/15/2021 10:37:28 ETIENNE, MOWERS ([id number]) -------------------------------------------------------------------------------- Physical Exam Details Patient Name: TARO, HIDROGO. Date of Service: 08/15/2021 9:45 AM Medical Record Number: [id number] Patient Account Number: [id number] Date of Birth/Sex: [birth year] (66 y.o. M) Treating RN: Donnamarie Poag Primary Care Provider: Benita Stabile Other Clinician: Referring Provider: Benita Stabile Treating Provider/Extender: Skipper Cliche in Treatment: 7 Constitutional Well-nourished and well-hydrated in no acute distress. Respiratory normal breathing without difficulty. Psychiatric this patient is able to make decisions and demonstrates good insight into disease process. Alert and Oriented x 3. pleasant and cooperative. Notes Upon inspection patient's wound bed actually showed signs of good granulation and epithelization at this point. Fortunately there does not appear to be any signs of active infection locally or systemically which is great news and overall very pleased with where we stand. Patient's wound does appear to be completely healed which is great news. Electronic Signature(s) Signed: 08/15/2021 10:37:49 AM By: Worthy Keeler PA-C Entered By: Worthy Keeler on 08/15/2021 10:37:49 MAMOUDOU, MULVEHILL ([id number]) -------------------------------------------------------------------------------- Physician Orders Details Patient Name: TRAVORIS, BUSHEY. Date of Service: 08/15/2021 9:45 AM Medical Record Number: [id number] Patient Account Number: [id number] Date of Birth/Sex: January 13, 1956 (66 y.o. M) Treating RN: Cornell Barman Primary Care Provider: Benita Stabile Other Clinician: Referring Provider: Benita Stabile Treating Provider/Extender: Skipper Cliche in  Treatment: 7 Verbal / Phone Orders: No Diagnosis Coding ICD-10 Coding Code Description E11.621 Type 2 diabetes mellitus with foot ulcer L97.512  Non-pressure chronic ulcer of other part of right foot with fat layer exposed R26.89 Other abnormalities of gait and mobility Discharge From [facility] Services o Discharge from [place] Treatment Complete Electronic Signature(s) Signed: 08/15/2021 1:24:30 PM By: Gretta Cool, BSN, RN, CWS, Kim RN, BSN Signed: 08/15/2021 5:51:16 PM By: Worthy Keeler PA-C Entered By: Gretta Cool, BSN, RN, CWS, Kim on 08/15/2021 10:20:11 IAM, LIPSON ([id number]) -------------------------------------------------------------------------------- Problem List Details Patient Name: LEWELLYN, FULTZ. Date of Service: 08/15/2021 9:45 AM Medical Record Number: [id number] Patient Account Number: [id number] Date of Birth/Sex: 02/04/1956 (66 y.o. M) Treating RN: Donnamarie Poag Primary Care Provider: Benita Stabile Other Clinician: Referring Provider: Benita Stabile Treating Provider/Extender: Skipper Cliche in Treatment: 7 Active Problems ICD-10 Encounter Code Description Active Date MDM Diagnosis E11.621 Type 2 diabetes mellitus with foot ulcer 06/26/2021 No Yes L97.512 Non-pressure chronic ulcer of other part of right foot with fat layer 06/26/2021 No Yes exposed R26.89 Other abnormalities of gait and mobility 06/26/2021 No Yes Inactive Problems Resolved Problems Electronic Signature(s) Signed: 08/15/2021 9:57:11 AM By: Worthy Keeler PA-C Entered By: Worthy Keeler on 08/15/2021 09:57:11 Jeronimo Greaves ([id number]) -------------------------------------------------------------------------------- Progress Note Details Patient Name: Jeronimo Greaves. Date of Service: 08/15/2021 9:45 AM Medical Record Number: [id number] Patient Account Number: [id number] Date of Birth/Sex: [birth year] (66 y.o. M) Treating RN: Donnamarie Poag Primary Care Provider: Benita Stabile Other Clinician: Referring Provider: Benita Stabile Treating Provider/Extender: Skipper Cliche in Treatment: 7 Subjective Chief Complaint Information obtained  from Patient 06/13/2021; Right plantar diabetic foot wound History of Present Illness (HPI) Admission 06/13/2021 Mr. Liliana Brentlinger is a 66 year old male with a past medical history of insulin-dependent type 2 diabetes with current self-reported hemoglobin A1c of 6.8, and peripheral neuropathy that presents to the clinic for a 53-month history of nonhealing ulcer to the right plantar foot. He follows with Dr. Posey Pronto, podiatry for this issue. He has been using Betadine wet-to-dry dressings. He is not currently using any offloading device. He denies systemic signs of infection. 06/20/2021; patient presents for follow-up. He obtained his x-ray of the right foot. He has not heard from vein and vascular about setting up an appointment for his ABIs. He has been using Hydrofera Blue to the wound bed and using his front offloading shoe. He reports improvement in wound healing. 3/29; patient presents for follow-up. He was originally seen at the [facility] site and has transferred over to the [place] wound center. I have been following him for his diabetic foot wound. He states he received the defender boot and has been using it for the past 3 days without issues. He continues to use Hydrofera Blue to the wound bed. He is scheduled for his MRI on 3/31. He denies signs of infection. 4/5; patient presents for follow-up. He has been using Hydrofera Blue to the wound bed without issues. He denies signs of infection. He has been using the Conservation officer, nature. He was scheduled for his MRI on 3/31 however insurance denied coverage. It is unclear why. The x-ray showed suspicious osteo to the second medial met head Where the wound is at. He currently denies systemic signs of infection. He had ABIs completed that showed a right ABI of 1.27 and right toe pressure of 0.84 with triphasic waveforms at ATA and PTA. 4/12; right fourth met head diabetic foot wound. Using [facility] has a Conservation officer, nature.  Apparently some suspicion of  osteomyelitis noted on his plain films but he was denied for an MRI. He states he is being religious with use of the defender boot and offloading his foot 4/19; patient presents for follow-up. He has been using Hydrofera Blue with the Conservation officer, nature. He had his MRI completed. He currently denies signs of infection. 4/26; patient presents for follow-up. He continues to use [facility] without issues. He continues using the Conservation officer, nature he has no issues or complaints today. He denies signs of infection. 5/3; patient presents for follow-up. He has been using Hydrofera Blue to the wound bed without issues. He is using the Conservation officer, nature. He states he has a knee scooter. His wife went on a girls trip and he has been on his feet more often in the past week. He currently denies signs of infection. 5/10; patient presents for follow-up. He has been using Hydrofera Blue to the wound bed. He has no issues or complaints today. He states he would like to try the total contact cast. 08-09-2021 upon evaluation today patient actually appears to be doing decently well in regard to the wound on his right plantar foot. Fortunately there does not appear to be any evidence of infection which is great news and overall very pleased with where we stand I think there is a little bit of callus and when to try to trim that away today. Also there is little bit of slough and biofilm noted on the surface of the wound. Overall however I think that this is looking well after just 2 days there is a significant improvement already. This is much smaller. Unfortunately the padding on the plantar aspect of the heel had squished up towards the end of his toes and when we were removing the cast with a cast saw it did actually nicked his skin along the heel. With that being said this is extremely superficial and more like an abrasion or scratch than anything and overall I think it is going to heal by the time he has a cast change on  Wednesday. This was discussed with the patient however and he is aware. 5/17; patient presents for follow-up. He tolerated the cast well. He has no issues or complaints today. 08-15-2021 upon evaluation today patient appears to be doing well with regard to his wound in fact this appears to be completely healed. He is here today because yesterday during his visit he had a cast reapplied there was a debate on whether that was needed or not they opted to go for 1 more week. Nonetheless he is having trouble with the cast sliding and not staying put which I think is the part of the issue he had before. Nonetheless I do believe that he is doing well I think the wound is healed and honestly I do not think he needs the cast any longer anyway. NIHAAL, FRIESEN. ([id number]) Objective Constitutional Well-nourished and well-hydrated in no acute distress. Vitals Time Taken: 10:00 AM, Height: 71 in, Weight: 254 lbs, BMI: 35.4, Temperature: 97.7 F, Pulse: 79 bpm, Respiratory Rate: 18 breaths/min, Blood Pressure: 113/71 mmHg. Respiratory normal breathing without difficulty. Psychiatric this patient is able to make decisions and demonstrates good insight into disease process. Alert and Oriented x 3. pleasant and cooperative. General Notes: Upon inspection patient's wound bed actually showed signs of good granulation and epithelization at this point. Fortunately there does not appear to be any signs of active infection locally or systemically which is great news  and overall very pleased with where we stand. Patient's wound does appear to be completely healed which is great news. Integumentary (Hair, Skin) Wound #1 status is Healed - Epithelialized. Original cause of wound was Blister. The date acquired was: 03/14/2021. The wound has been in treatment 7 weeks. The wound is located on the [place]. The wound measures 0cm length x 0cm width x 0cm depth; 0cm^2 area and 0cm^3 volume. There is Fat Layer  (Subcutaneous Tissue) exposed. There is a medium amount of serosanguineous drainage noted. The wound margin is thickened. There is large (67-100%) red, pink granulation within the wound bed. There is a small (1-33%) amount of necrotic tissue within the wound bed including Adherent Slough. Assessment Active Problems ICD-10 Type 2 diabetes mellitus with foot ulcer Non-pressure chronic ulcer of other part of right foot with fat layer exposed Other abnormalities of gait and mobility Plan Discharge From [facility] Services: Discharge from [place] Treatment Complete 1. Based on what I am seeing I Georgina Peer recommend that we going discontinue wound care services as the patient appears to be completely healed. 2. I am good to have him wear his foot defender offloading boot for 2 weeks following that he can get into his normal shoe. 3. I am can recommend as well he monitor his foot every night for any signs of changes if anything changes he should let us know soon as possible. We will see patient back for reevaluation in 1 week here in the clinic. If anything worsens or changes patient will contact our office for additional recommendations. Electronic Signature(s) Signed: 08/15/2021 10:39:16 AM By: Worthy Keeler PA-C Entered By: Worthy Keeler on 08/15/2021 10:39:15 MITCH, ARQUETTE ([id number]) -------------------------------------------------------------------------------- SuperBill Details Patient Name: SEHAJ, MCENROE. Date of Service: 08/15/2021 Medical Record Number: [id number] Patient Account Number: [id number] Date of Birth/Sex: Sep 29, 1955 (66 y.o. M) Treating RN: Donnamarie Poag Primary Care Provider: Benita Stabile Other Clinician: Referring Provider: Benita Stabile Treating Provider/Extender: Skipper Cliche in Treatment: 7 Diagnosis Coding ICD-10 Codes Code Description E11.621 Type 2 diabetes mellitus with foot ulcer L97.512 Non-pressure chronic ulcer of other part of right foot with  fat layer exposed R26.89 Other abnormalities of gait and mobility Facility Procedures CPT4 Code: [id number] Description: [phone] - WOUND CARE VISIT-LEV 2 EST PT Modifier: Quantity: 1 Physician Procedures CPT4 Code: [id number] Description: [id number] - WC PHYS LEVEL 3 - EST PT Modifier: Quantity: 1 CPT4 Code: Description: ICD-10 Diagnosis Description E11.621 Type 2 diabetes mellitus with foot ulcer L97.512 Non-pressure chronic ulcer of other part of right foot with fat layer e R26.89 Other abnormalities of gait and mobility Modifier: xposed Quantity: Electronic Signature(s) Signed: 08/15/2021 10:41:13 AM By: Worthy Keeler PA-C Entered By: Worthy Keeler on 08/15/2021 10:41:12

## 2021-08-15 NOTE — Progress Notes (Addendum)
Christopher, Escobar ([id number]) Visit Report for 08/15/2021 Arrival Information Details Patient Name: Christopher Escobar, Christopher Escobar. Date of Service: 08/15/2021 9:45 AM Medical Record Number: [id number] Patient Account Number: [id number] Date of Birth/Sex: [birth year] (66 y.o. M) Treating RN: Christopher Escobar Primary Care Christopher Escobar: Christopher Escobar Other Clinician: Referring Christopher Escobar: Christopher Escobar Treating Cate Oravec/Extender: Christopher Escobar in Treatment: 7 Visit Information History Since Last Visit Added or deleted any medications: No Patient Arrived: Ambulatory Any new allergies or adverse reactions: No Arrival Time: 09:49 Hospitalized since last visit: No Accompanied By: self Pain Present Now: No Transfer Assistance: None Patient Requires Transmission-Based No Precautions: Patient Has Alerts: Yes Patient Alerts: DIABETIC AVVS ABI/TBI 07/03/21 R 1.27; L 1.23 Electronic Signature(s) Signed: 08/15/2021 1:24:30 PM By: Christopher Escobar, BSN, RN, CWS, Kim RN, BSN Entered By: Christopher Escobar, BSN, RN, CWS, Christopher Escobar on 08/15/2021 10:06:57 Christopher Escobar ([id number]) -------------------------------------------------------------------------------- Clinic Level of Care Assessment Details Patient Name: Christopher Escobar, Christopher Escobar. Date of Service: 08/15/2021 9:45 AM Medical Record Number: [id number] Patient Account Number: [id number] Date of Birth/Sex: [birth year] (66 y.o. M) Treating RN: Christopher Escobar Primary Care Annaclaire Walsworth: Christopher Escobar Other Clinician: Referring Christopher Escobar: Christopher Escobar Treating Christopher Escobar/Extender: Christopher Escobar in Treatment: 7 Clinic Level of Care Assessment Items TOOL 4 Quantity Score '[]'$  - Use when only an EandM is performed on FOLLOW-UP visit 0 ASSESSMENTS - Nursing Assessment / Reassessment '[]'$  - Reassessment of Co-morbidities (includes updates in patient status) 0 X- 1 5 Reassessment of Adherence to Treatment Plan ASSESSMENTS - Wound and Skin Assessment / Reassessment X - Simple Wound Assessment / Reassessment - one wound 1  5 '[]'$  - 0 Complex Wound Assessment / Reassessment - multiple wounds '[]'$  - 0 Dermatologic / Skin Assessment (not related to wound area) ASSESSMENTS - Focused Assessment '[]'$  - Circumferential Edema Measurements - multi extremities 0 '[]'$  - 0 Nutritional Assessment / Counseling / Intervention '[]'$  - 0 Lower Extremity Assessment (monofilament, tuning fork, pulses) '[]'$  - 0 Peripheral Arterial Disease Assessment (using hand held doppler) ASSESSMENTS - Ostomy and/or Continence Assessment and Care '[]'$  - Incontinence Assessment and Management 0 '[]'$  - 0 Ostomy Care Assessment and Management (repouching, etc.) PROCESS - Coordination of Care X - Simple Patient / Family Education for ongoing care 1 15 '[]'$  - 0 Complex (extensive) Patient / Family Education for ongoing care X- 1 10 Staff obtains Consents, Records, Test Results / Process Orders '[]'$  - 0 Staff telephones HHA, Nursing Homes / Clarify orders / etc '[]'$  - 0 Routine Transfer to another Facility (non-emergent condition) '[]'$  - 0 Routine Hospital Admission (non-emergent condition) '[]'$  - 0 New Admissions / Biomedical engineer / Ordering NPWT, Apligraf, etc. '[]'$  - 0 Emergency Hospital Admission (emergent condition) X- 1 10 Simple Discharge Coordination '[]'$  - 0 Complex (extensive) Discharge Coordination PROCESS - Special Needs '[]'$  - Pediatric / Minor Patient Management 0 '[]'$  - 0 Isolation Patient Management '[]'$  - 0 Hearing / Language / Visual special needs '[]'$  - 0 Assessment of Community assistance (transportation, D/C planning, etc.) '[]'$  - 0 Additional assistance / Altered mentation '[]'$  - 0 Support Surface(s) Assessment (bed, cushion, seat, etc.) INTERVENTIONS - Wound Cleansing / Measurement Christopher Escobar, Christopher Escobar. ([id number]) '[]'$  - 0 Simple Wound Cleansing - one wound '[]'$  - 0 Complex Wound Cleansing - multiple wounds '[]'$  - 0 Wound Imaging (photographs - any number of wounds) '[]'$  - 0 Wound Tracing (instead of photographs) '[]'$  - 0 Simple Wound  Measurement - one wound '[]'$  - 0 Complex Wound Measurement - multiple wounds INTERVENTIONS - Wound Dressings '[]'$  - Small  Wound Dressing one or multiple wounds 0 '[]'$  - 0 Medium Wound Dressing one or multiple wounds '[]'$  - 0 Large Wound Dressing one or multiple wounds '[]'$  - 0 Application of Medications - topical '[]'$  - 0 Application of Medications - injection INTERVENTIONS - Miscellaneous '[]'$  - External ear exam 0 '[]'$  - 0 Specimen Collection (cultures, biopsies, blood, body fluids, etc.) '[]'$  - 0 Specimen(s) / Culture(s) sent or taken to Lab for analysis '[]'$  - 0 Patient Transfer (multiple staff / Civil Service fast streamer / Similar devices) '[]'$  - 0 Simple Staple / Suture removal (25 or less) '[]'$  - 0 Complex Staple / Suture removal (26 or more) '[]'$  - 0 Hypo / Hyperglycemic Management (close monitor of Blood Glucose) '[]'$  - 0 Ankle / Brachial Index (ABI) - do not check if billed separately X- 1 5 Vital Signs Has the patient been seen at the hospital within the last three years: Yes Total Score: 50 Level Of Care: New/Established - Level 2 Electronic Signature(s) Signed: 08/15/2021 1:24:30 PM By: Christopher Escobar, BSN, RN, CWS, Kim RN, BSN Entered By: Christopher Escobar, BSN, RN, CWS, Christopher Escobar on 08/15/2021 10:21:00 Christopher Escobar ([id number]) -------------------------------------------------------------------------------- Encounter Discharge Information Details Patient Name: Christopher, Escobar. Date of Service: 08/15/2021 9:45 AM Medical Record Number: [id number] Patient Account Number: [id number] Date of Birth/Sex: 09/12/1955 (66 y.o. M) Treating RN: Christopher Escobar Primary Care Christopher Escobar: Christopher Escobar Other Clinician: Referring Christopher Escobar: Christopher Escobar Treating Christopher Escobar/Extender: Christopher Escobar in Treatment: 7 Encounter Discharge Information Items Discharge Condition: Stable Ambulatory Status: Ambulatory Discharge Destination: Home Transportation: Private Auto Accompanied By: self Schedule Follow-up Appointment: No Clinical Summary  of Care: Electronic Signature(s) Signed: 08/15/2021 1:24:30 PM By: Christopher Escobar, BSN, RN, CWS, Kim RN, BSN Entered By: Christopher Escobar, BSN, RN, CWS, Christopher Escobar on 08/15/2021 10:23:07 Christopher Escobar ([id number]) -------------------------------------------------------------------------------- Lower Extremity Assessment Details Patient Name: Christopher Escobar, Christopher Escobar. Date of Service: 08/15/2021 9:45 AM Medical Record Number: [id number] Patient Account Number: [id number] Date of Birth/Sex: 1956-03-29 (66 y.o. M) Treating RN: Christopher Escobar Primary Care Idania Desouza: Christopher Escobar Other Clinician: Referring Feliza Diven: Christopher Escobar Treating Hidaya Daniel/Extender: Jeri Cos Weeks in Treatment: 7 Edema Assessment Assessed: Shirlyn Goltz: No] [Right: Yes] Edema: [Left: N] [Right: o] Vascular Assessment Pulses: Dorsalis Pedis Palpable: [Right:Yes] Electronic Signature(s) Signed: 08/15/2021 1:24:30 PM By: Christopher Escobar, BSN, RN, CWS, Kim RN, BSN Entered By: Christopher Escobar, BSN, RN, CWS, Christopher Escobar on 08/15/2021 10:09:53 Christopher Escobar, Christopher Escobar ([id number]) -------------------------------------------------------------------------------- Multi Wound Chart Details Patient Name: Christopher Escobar, Christopher Escobar. Date of Service: 08/15/2021 9:45 AM Medical Record Number: [id number] Patient Account Number: [id number] Date of Birth/Sex: 10-Feb-1956 (66 y.o. M) Treating RN: Christopher Escobar Primary Care Marlow Hendrie: Christopher Escobar Other Clinician: Referring Alexus Galka: Christopher Escobar Treating Tangia Pinard/Extender: Christopher Escobar in Treatment: 7 Vital Signs Height(in): 71 Pulse(bpm): 52 Weight(lbs): 254 Blood Pressure(mmHg): 113/71 Body Mass Index(BMI): 35.4 Temperature(F): 97.7 Respiratory Rate(breaths/min): 18 Photos: [1:No Photos] [N/A:N/A] Wound Location: [1:Right, Plantar Foot] [N/A:N/A] Wounding Event: [1:Blister] [N/A:N/A] Primary Etiology: [1:Diabetic Wound/Ulcer of the Lower Extremity] [N/A:N/A] Comorbid History: [1:Sleep Apnea, Hypertension, Type II Diabetes, History of pressure wounds,  Osteomyelitis, Neuropathy] [N/A:N/A] Date Acquired: [1:03/14/2021] [N/A:N/A] Weeks of Treatment: [1:7] [N/A:N/A] Wound Status: [1:Healed - Epithelialized] [N/A:N/A] Wound Recurrence: [1:No] [N/A:N/A] Measurements L x W x D (cm) [1:0x0x0] [N/A:N/A] Area (cm) : [1:0] [N/A:N/A] Volume (cm) : [1:0] [N/A:N/A] % Reduction in Area: [1:100.00%] [N/A:N/A] % Reduction in Volume: [1:100.00%] [N/A:N/A] Classification: [1:Grade 2] [N/A:N/A] Exudate Amount: [1:Medium] [N/A:N/A] Exudate Type: [1:Serosanguineous] [N/A:N/A] Exudate Color: [1:red, brown] [N/A:N/A] Wound Margin: [1:Thickened] [N/A:N/A] Granulation Amount: [1:Large (67-100%)] [N/A:N/A] Granulation Quality: [1:Red, Pink] [N/A:N/A]  Necrotic Amount: [1:Small (1-33%)] [N/A:N/A] Exposed Structures: [1:Fat Layer (Subcutaneous Tissue): Yes Fascia: No Tendon: No Muscle: No Joint: No Bone: No Medium (34-66%)] [N/A:N/A N/A] Treatment Notes Electronic Signature(s) Signed: 08/15/2021 1:24:30 PM By: Christopher Escobar, BSN, RN, CWS, Kim RN, BSN Entered By: Christopher Escobar, BSN, RN, CWS, Christopher Escobar on 08/15/2021 10:19:30 Christopher Escobar, Christopher Escobar ([id number]) -------------------------------------------------------------------------------- [place] Details Patient Name: Christopher Escobar, Christopher Escobar. Date of Service: 08/15/2021 9:45 AM Medical Record Number: [id number] Patient Account Number: [id number] Date of Birth/Sex: Nov 22, 1955 (66 y.o. M) Treating RN: Christopher Escobar Primary Care Samari Bittinger: Christopher Escobar Other Clinician: Referring Lejuan Botto: Christopher Escobar Treating Chesney Klimaszewski/Extender: Christopher Escobar in Treatment: 7 Active Inactive Electronic Signature(s) Signed: 08/15/2021 1:24:30 PM By: Christopher Escobar, BSN, RN, CWS, Kim RN, BSN Entered By: Christopher Escobar, BSN, RN, CWS, Christopher Escobar on 08/15/2021 10:19:20 Christopher Escobar, Christopher Escobar ([id number]) -------------------------------------------------------------------------------- Pain Assessment Details Patient Name: Christopher Escobar, Christopher Escobar. Date of Service: 08/15/2021 9:45  AM Medical Record Number: [id number] Patient Account Number: [id number] Date of Birth/Sex: [birth year] (66 y.o. M) Treating RN: Christopher Escobar Primary Care Cashlyn Huguley: Christopher Escobar Other Clinician: Referring Duha Abair: Christopher Escobar Treating Tykerria Mccubbins/Extender: Christopher Escobar in Treatment: 7 Active Problems Location of Pain Severity and Description of Pain Patient Has Paino No Site Locations Pain Management and Medication Current Pain Management: Electronic Signature(s) Signed: 08/15/2021 1:24:30 PM By: Christopher Escobar, BSN, RN, CWS, Kim RN, BSN Entered By: Christopher Escobar, BSN, RN, CWS, Christopher Escobar on 08/15/2021 10:08:41 Christopher Escobar ([id number]) -------------------------------------------------------------------------------- Patient/Caregiver Education Details Patient Name: Christopher Escobar, Christopher Escobar. Date of Service: 08/15/2021 9:45 AM Medical Record Number: [id number] Patient Account Number: [id number] Date of Birth/Gender: 1956/01/13 (66 y.o. M) Treating RN: Christopher Escobar Primary Care Physician: Christopher Escobar Other Clinician: Referring Physician: Benita Escobar Treating Physician/Extender: Christopher Escobar in Treatment: 7 Education Assessment Education Provided To: Patient Education Topics Provided Offloading: Handouts: Other: patient to [place] [facility] foot defender for 2 weeks Electronic Signature(s) Signed: 08/15/2021 1:24:30 PM By: Christopher Escobar, BSN, RN, CWS, Kim RN, BSN Entered By: Christopher Escobar, BSN, RN, CWS, Christopher Escobar on 08/15/2021 10:22:09 ORESTE, MAJEED ([id number]) -------------------------------------------------------------------------------- Wound Assessment Details Patient Name: Christopher Escobar, Christopher Escobar. Date of Service: 08/15/2021 9:45 AM Medical Record Number: [id number] Patient Account Number: [id number] Date of Birth/Sex: 01-24-1956 (66 y.o. M) Treating RN: Christopher Escobar Primary Care Tyjay Galindo: Christopher Escobar Other Clinician: Referring Layden Caterino: Christopher Escobar Treating Khriz Liddy/Extender: Christopher Escobar in Treatment: 7 Wound Status Wound  Number: 1 Primary Diabetic Wound/Ulcer of the Lower Extremity Etiology: Wound Location: Right, Plantar Foot Wound Healed - Epithelialized Wounding Event: Blister Status: Date Acquired: 03/14/2021 Comorbid Sleep Apnea, Hypertension, Type II Diabetes, History of Weeks Of Treatment: 7 History: pressure wounds, Osteomyelitis, Neuropathy Clustered Wound: No Wound Measurements Length: (cm) 0 Width: (cm) 0 Depth: (cm) 0 Area: (cm) 0 Volume: (cm) 0 % Reduction in Area: 100% % Reduction in Volume: 100% Epithelialization: Medium (34-66%) Wound Description Classification: Grade 2 Wound Margin: Thickened Exudate Amount: Medium Exudate Type: Serosanguineous Exudate Color: red, brown Foul Odor After Cleansing: No Slough/Fibrino Yes Wound Bed Granulation Amount: Large (67-100%) Exposed Structure Granulation Quality: Red, Pink Fascia Exposed: No Necrotic Amount: Small (1-33%) Fat Layer (Subcutaneous Tissue) Exposed: Yes Necrotic Quality: Adherent Slough Tendon Exposed: No Muscle Exposed: No Joint Exposed: No Bone Exposed: No Treatment Notes Wound #1 (Foot) Wound Laterality: Plantar, Right Cleanser Peri-Wound Care Topical Primary Dressing Secondary Dressing Secured With Compression Wrap Compression Stockings Add-Ons Electronic Signature(s) Signed: 08/15/2021 1:24:30 PM By: Christopher Escobar, BSN, RN, CWS, Kim RN, BSN Entered By: Christopher Escobar, BSN, RN, CWS, Christopher Escobar on 08/15/2021 10:09:08 Christopher Escobar ([id number]) Dahlia Client, [place]  Jerilynn Mages ([id number]) -------------------------------------------------------------------------------- Vitals Details Patient Name: BRANDELL, MAREADY. Date of Service: 08/15/2021 9:45 AM Medical Record Number: [id number] Patient Account Number: [id number] Date of Birth/Sex: [birth year] (66 y.o. M) Treating RN: Christopher Escobar Primary Care Ezariah Nace: Christopher Escobar Other Clinician: Referring Sander Speckman: Christopher Escobar Treating Antar Milks/Extender: Christopher Escobar in Treatment: 7 Vital  Signs Time Taken: 10:00 Temperature (F): 97.7 Height (in): 71 Pulse (bpm): 79 Weight (lbs): 254 Respiratory Rate (breaths/min): 18 Body Mass Index (BMI): 35.4 Blood Pressure (mmHg): 113/71 Reference Range: 80 - 120 mg / dl Electronic Signature(s) Signed: 08/15/2021 1:24:30 PM By: Christopher Escobar, BSN, RN, CWS, Kim RN, BSN Entered By: Christopher Escobar, BSN, RN, CWS, Christopher Escobar on 08/15/2021 10:07:35

## 2021-08-21 ENCOUNTER — Encounter: Payer: Medicare HMO | Admitting: Internal Medicine

## 2021-08-28 ENCOUNTER — Encounter: Payer: Medicare HMO | Admitting: Internal Medicine

## 2022-01-02 NOTE — Progress Notes (Addendum)
Referring Physician:  Albina Billet, MD [address]   [place],  [place] [address]  Primary Physician:  Albina Billet, MD  History of Present Illness: 01/02/2022 Mr. Anna Livers was seen at [facility] on 8/21/123 for LBP and right buttock pain.   Given medrol dose pack and flexeril on 10/29/21. These did not help. PCP had him on percocet 7.5 as well. This took the edge.   History of lumbar fusion L3-L5 in 1989.    He has constant LBP with right buttock and right posterior leg pain to his knee x 2-3 months. He has numbness/tingling in right leg. No left leg pain. Right buttock pain is his worst pain. Pain is worse with sitting. He does okay with walking. Can't sleep at night due to pain. No weakness in right leg.   History of HTN, DM, and OSA. He has neuropathy in both feet.   Bowel/Bladder Dysfunction: none  Conservative measures:  Physical therapy: no recent Multimodal medical therapy including regular antiinflammatories: percocet 7.5, medrol dose pack, flexeril, lyrica Injections: No recent epidural steroid injections  Past Surgery: Instrumented fusion L3-L5 in [place] has no symptoms of cervical myelopathy.  The symptoms are causing a significant impact on the patient's life.   Review of Systems:  A 10 point review of systems is negative, except for the pertinent positives and negatives detailed in the HPI.  Past Medical History: Past Medical History:  Diagnosis Date   Diabetes mellitus without complication ([place]) 3419   Hypertension 2008   Personal history of colonic polyps    TA polyp   Sleep apnea    not using cpap    Past Surgical History: Past Surgical History:  Procedure Laterality Date   AMPUTATION Right 09/15/2019   Procedure: AMPUTATION RIGHT FOOT 2ND DIGIT;  Surgeon: Felipa Furnace, DPM;  Location: [facility];  Service: Podiatry;  Laterality: Right;   AMPUTATION Left 02/08/2021   Procedure: AMPUTATION RAY-Partial 5th Ray;  Surgeon:  [name], DPM;  Location: [facility];  Service: Podiatry;  Laterality: Left;   BACK SURGERY  1989   COLONOSCOPY  2013   COLONOSCOPY WITH PROPOFOL N/A 10/28/2016   Procedure: COLONOSCOPY WITH PROPOFOL;  Surgeon: Robert Bellow, MD;  Location: [facility];  Service: Endoscopy;  Laterality: N/A;   SHOULDER ARTHROSCOPY WITH SUBACROMIAL DECOMPRESSION Left 04/02/2017   Procedure: SHOULDER ARTHROSCOPY WITH DECOMPRESSION, DEBRIDEMENT, SLAP REPAIR,;  Surgeon: Corky Mull, MD;  Location: [facility];  Service: Orthopedics;  Laterality: Left;   SHOULDER SURGERY Bilateral 2011,2012    Allergies: Allergies as of 01/03/2022   (No Known Allergies)    Medications: Outpatient Encounter Medications as of 01/03/2022  Medication Sig   acetaminophen (TYLENOL) 325 MG tablet Take 2 tablets (650 mg total) by mouth every 6 (six) hours as needed for mild pain (or Fever >/= 101).   atorvastatin (LIPITOR) 40 MG tablet Take 40 mg by mouth daily at 6 PM.    collagenase (SANTYL) ointment Apply 1 application topically daily.   insulin detemir (LEVEMIR) 100 UNIT/ML injection Inject 60 Units into the skin at bedtime.   JARDIANCE 25 MG TABS tablet Take 25 mg by mouth daily.   metoprolol succinate (TOPROL-XL) 25 MG 24 hr tablet Take 25 mg every morning by mouth.    multivitamin-lutein (OCUVITE-LUTEIN) CAPS capsule Take 1 capsule by mouth 2 (two) times daily.   mupirocin ointment (BACTROBAN) 2 % Apply 1 application topically 2 (two) times daily.  NOVOLOG FLEXPEN 100 UNIT/ML FlexPen Inject 7-10 Units into the skin 2 (two) times daily before a meal.   polyethylene glycol (MIRALAX) 17 g packet Take 17 g by mouth daily as needed.   pregabalin (LYRICA) 75 MG capsule Take 75 mg by mouth 2 (two) times daily.    senna (SENOKOT) 8.6 MG TABS tablet Take 2 tablets (17.2 mg total) by mouth at bedtime.   tamsulosin (FLOMAX) 0.4 MG CAPS capsule Take 0.4 mg by mouth daily.   No facility-administered encounter medications on file  as of 01/03/2022.    Social History: Social History   Tobacco Use   Smoking status: Former    Packs/day: 1.00    Years: 30.00    Total pack years: 30.00    Types: Cigarettes    Quit date: 02/03/2007    Years since quitting: 14.9   Smokeless tobacco: Never  Vaping Use   Vaping Use: Never used  Substance Use Topics   Alcohol use: Yes    Comment: seldom   Drug use: No    Family Medical History: No family history on file.  Physical Examination: There were no vitals filed for this visit.  General: Patient is well developed, well nourished, calm, collected, and in no apparent distress. Attention to examination is appropriate.  Respiratory: Patient is breathing without any difficulty.   NEUROLOGICAL:     Awake, alert, oriented to person, place, and time.  Speech is clear and fluent. Fund of knowledge is appropriate.   Cranial Nerves: Pupils equal round and reactive to light.  Facial tone is symmetric.  Facial sensation is symmetric.  ROM of spine:  Reasonable ROM of lumbar spine with no pain  No abnormal lesions on exposed skin.   Well healed lumbar incision.   Mild lower lumbar tenderness with point tenderness over right SI joint.   Strength: Side Biceps Triceps Deltoid Interossei Grip Wrist Ext. Wrist Flex.  R '5 5 5 5 5 5 5  '$ L '5 5 5 5 5 5 5   '$ Side Iliopsoas Quads Hamstring PF DF EHL  R '5 5 5 5 5 5  '$ L '5 5 5 5 5 5   '$ Reflexes are 2+ and symmetric at the biceps, triceps, brachioradialis, patella and achilles.   Hoffman's is absent.  Clonus is not present.   Bilateral upper and lower extremity sensation is intact to light touch.     Gait is normal.     Medical Decision Making  Imaging: None to review  I have personally reviewed the images and agree with the above interpretation.  Assessment and Plan: Mr. Eatherly is a pleasant 66 y.o. male with constant LBP with right buttock and right posterior leg pain to his knee x 2-3 months. He has numbness/tingling in  right leg. No left leg pain. Right buttock pain is his worst pain.   History of lumbar fusion L3-L5 in 1989.    No imaging to review. He does have significant tenderness over right SI joint.   Treatment options discussed with patient and following plan made:   - Lumbar xrays with flexion/extension views. Will message him with results.  - PT for lumbar spine. Orders to [facility] in [place].  - Depending on progress with above, may consider lumbar MRI and/or referral to PMR for possible right SI joint injection.  - Follow up with me in 6 weeks.   I spent a total of 30 minutes in face-to-face and non-face-to-face activities related to this patient's care today.  ADDENDUM 01/06/22:  Lumbar xrays dated 01/03/22:  FINDINGS: Five lumbar type vertebral bodies. Normal alignment. Postoperative changes with posterior laminectomies and posterior rod and screw fixation from L3 through L5. Surgical hardware appears intact without change in position or appearance since previous study. No significant change in alignment through flexion and extension. Degenerative changes throughout the lumbar spine with narrowed interspaces and endplate osteophyte formation. Progression of degenerative changes is demonstrated since the prior study. Visualized sacrum appears intact.   IMPRESSION: Postoperative and degenerative changes in the lumbar spine as described.     Electronically Signed   By: Lucienne Capers M.D.   On: 01/04/2022 20:24  I have personally reviewed the images and agree with the above interpretation.   No change to above plan. He has mild DDD/spondylosis with previous L3-L5 fusion. Patient sent message regarding xray results.   Thank you for involving me in the care of this patient.   Geronimo Boot PA-C Dept. of Neurosurgery

## 2022-01-03 ENCOUNTER — Encounter: Payer: Self-pay | Admitting: Orthopedic Surgery

## 2022-01-03 ENCOUNTER — Ambulatory Visit
Admission: RE | Admit: 2022-01-03 | Discharge: 2022-01-03 | Disposition: A | Discharge status: Home / Self Care | Payer: Medicare HMO | Attending: Orthopedic Surgery | Admitting: Orthopedic Surgery

## 2022-01-03 ENCOUNTER — Ambulatory Visit
Admission: RE | Admit: 2022-01-03 | Discharge: 2022-01-03 | Disposition: A | Discharge status: Home / Self Care | Payer: Medicare HMO | Source: Ambulatory Visit | Attending: Orthopedic Surgery | Admitting: Orthopedic Surgery

## 2022-01-03 ENCOUNTER — Ambulatory Visit: Payer: Medicare HMO | Admitting: Orthopedic Surgery

## 2022-01-03 VITALS — BP 132/80 | Ht 71.0 in | Wt 242.6 lb

## 2022-01-03 DIAGNOSIS — M5441 Lumbago with sciatica, right side: Secondary | ICD-10-CM | POA: Insufficient documentation

## 2022-01-03 DIAGNOSIS — M5416 Radiculopathy, lumbar region: Secondary | ICD-10-CM

## 2022-01-03 DIAGNOSIS — Z981 Arthrodesis status: Secondary | ICD-10-CM | POA: Diagnosis present

## 2022-01-03 NOTE — Patient Instructions (Signed)
It was so nice to see you today, I am sorry that you are hurting so much.   I want to get some updated lower back xrays. You can go across the street ([place]). The order is in. I will message you with the results.   I sent physical therapy orders to [facility] in [place], they should call you to schedule.   If no improvement, may consider lumbar MRI.   I will see you back in 6 weeks. Please do not hesitate to call if you have any questions or concerns. You can also message me in [place].   If you have not heard back about any of the tests/procedures in the next week, please call the office so we can help you get these things scheduled.   Geronimo Boot PA-C [phone]

## 2022-01-09 ENCOUNTER — Ambulatory Visit: Payer: Medicare HMO | Attending: Orthopedic Surgery | Admitting: Physical Therapy

## 2022-01-09 DIAGNOSIS — M5459 Other low back pain: Secondary | ICD-10-CM | POA: Insufficient documentation

## 2022-01-09 DIAGNOSIS — M25551 Pain in right hip: Secondary | ICD-10-CM | POA: Insufficient documentation

## 2022-01-09 DIAGNOSIS — M5416 Radiculopathy, lumbar region: Secondary | ICD-10-CM | POA: Insufficient documentation

## 2022-01-09 DIAGNOSIS — R262 Difficulty in walking, not elsewhere classified: Secondary | ICD-10-CM | POA: Diagnosis present

## 2022-01-09 DIAGNOSIS — M6281 Muscle weakness (generalized): Secondary | ICD-10-CM | POA: Insufficient documentation

## 2022-01-09 DIAGNOSIS — Z981 Arthrodesis status: Secondary | ICD-10-CM | POA: Diagnosis not present

## 2022-01-09 DIAGNOSIS — M5441 Lumbago with sciatica, right side: Secondary | ICD-10-CM | POA: Insufficient documentation

## 2022-01-09 NOTE — Therapy (Signed)
OUTPATIENT PHYSICAL THERAPY THORACOLUMBAR EVALUATION   Patient Name: Christopher Escobar MRN: [id number] DOB:1955/07/21, 66 y.o., male Today's Date: 01/14/2022   PT End of Session - 01/14/22 1220     Visit Number 1    Number of Visits 13    Date for PT Re-Evaluation 02/20/22    Authorization Type [employer] 2023    Authorization Time Period initial eval 01/09/22    Progress Note Due on Visit 10    PT Start Time 1301    PT Stop Time 1347    PT Time Calculation (min) 46 min    Activity Tolerance Patient limited by pain    Behavior During Therapy [facility] for tasks assessed/performed              Past Medical History:  Diagnosis Date   Diabetes mellitus without complication ([place]) 7262   Hypertension 2008   Personal history of colonic polyps    TA polyp   Sleep apnea    not using cpap   Past Surgical History:  Procedure Laterality Date   AMPUTATION Right 09/15/2019   Procedure: AMPUTATION RIGHT FOOT 2ND DIGIT;  Surgeon: Felipa Furnace, DPM;  Location: [place];  Service: Podiatry;  Laterality: Right;   AMPUTATION Left 02/08/2021   Procedure: AMPUTATION RAY-Partial 5th Ray;  Surgeon: [name], DPM;  Location: [facility];  Service: Podiatry;  Laterality: Left;   BACK SURGERY  1989   COLONOSCOPY  2013   COLONOSCOPY WITH PROPOFOL N/A 10/28/2016   Procedure: COLONOSCOPY WITH PROPOFOL;  Surgeon: Robert Bellow, MD;  Location: [facility];  Service: Endoscopy;  Laterality: N/A;   SHOULDER ARTHROSCOPY WITH SUBACROMIAL DECOMPRESSION Left 04/02/2017   Procedure: SHOULDER ARTHROSCOPY WITH DECOMPRESSION, DEBRIDEMENT, SLAP REPAIR,;  Surgeon: Corky Mull, MD;  Location: [facility];  Service: Orthopedics;  Laterality: Left;   SHOULDER SURGERY Bilateral 2011,2012   Patient Active Problem List   Diagnosis Date Noted   Hypotension    Benign prostatic hyperplasia without lower urinary tract symptoms    Sepsis without acute organ dysfunction ([place])    Diabetic polyneuropathy associated with  type 2 diabetes mellitus ([place])    Pyogenic inflammation of bone ([place]) 02/07/2021   Right foot infection 09/13/2019   HLD (hyperlipidemia) 09/13/2019   AKI (acute kidney injury) ([place]) 09/13/2019   Hyponatremia 09/13/2019   Class 2 obesity with body mass index (BMI) of 37.0 to 37.9 in adult 09/06/2019   Essential hypertension 06/03/2017   Degenerative joint disease of shoulder, left 12/03/2016   Incomplete tear of left rotator cuff 12/03/2016   Rotator cuff tendinitis, left 12/03/2016   Type 2 diabetes mellitus with hyperlipidemia ([place]) 11/03/2016   History of arthroscopy of left shoulder 11/03/2016   History of colonic polyps 10/08/2016    PCP: Albina Billet, MD  REFERRING PROVIDER: Geronimo Boot, PA-C  REFERRING DIAGNOSIS:  M54.41 (ICD-10-CM) - Bilateral low back pain with right-sided sciatica, unspecified chronicity  M54.16 (ICD-10-CM) - Lumbar radiculopathy  Z98.1 (ICD-10-CM) - S/P lumbar spinal fusion    THERAPY DIAG: No diagnosis found.  RATIONALE FOR EVALUATION AND TREATMENT: Rehabilitation  ONSET DATE: 10/03/21  FOLLOW UP APPT WITH PROVIDER: Yes 02/14/22   SUBJECTIVE:  Chief Complaint: Patient is a 66 year old male with primary complaint of low back and R buttock pain.   Pertinent History Patient is a 66 year old male with primary complaint of low back and R buttock pain.  Patient was seen at [facility] on 8/21/123 for LBP and right buttock pain.  Given medrol dose pack and flexeril on 10/29/21. These did not help. PCP had him on percocet 7.5 as well. This took the edge off.  History of lumbar fusion L3-L5 in 1989.   He has constant LBP with right buttock and right posterior leg pain to his knee x 2-3 months. He has numbness/tingling in right leg. No left leg pain. Right buttock pain is his  worst pain. Pain is worse with sitting. He does okay with walking. Can't sleep at night due to pain. Pt reports recently he was moving dog with his foot an had notable pain along R buttock after that. Patient reports that it hurts to sit on hard surfaces. Patient reports numbness in R anterior thigh. Pt reports years of anterior thigh numbness prior to this incident involving R hip. Patient reports pain along R SI region that runs into buttock down to gluteal fold. Pt reports he also fell onto his R hip after this, which aggravated the area. Hx of Type 2 DM and hx of partial amputations along L and R foot. Pt reports no bowel/bladder changes. Pt reports disturbed sleep, he reports it is better if he rolls to his L side. Pt reports recent weight loss c trying to lose weight.   Pain:  Pain Intensity: Present: 4/10, Best: /10, Worst: 7-8/10 Pain location: R iliolumbar/R SI region down to R buttock Pain Quality: aching , constant Radiating: Yes to buttock/gluteal fold  Numbness/Tingling: Yes, longstanding numbness in R anterior thigh that seems to have worsened  Focal Weakness: weakness in RLE "once or twice"  Aggravating factors: sitting (especially on hard surface), yard work, lying on his R side, prolonged walking Relieving factors: reclined bed frame (adjustable bed frame), inversion/positional traction, pain medicine  24-hour pain behavior: None How long can you walk: to back yard and back to house  History of prior back injury, pain, surgery, or therapy: Yes, L3-5 fusion in 1989 Falls: Has patient fallen in last 6 months? 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  Red flags (bowel/bladder changes, saddle paresthesia, personal history of cancer, h/o spinal tumors, h/o compression fx, h/o abdominal aneurysm, abdominal pain,  chills/fever, night sweats, nausea, vomiting, unrelenting pain, first onset of insidious LBP <20 y/o): Negative  Precautions: None  Weight Bearing Restrictions: No  Living Environment Lives with: lives with their family, pt lives with wife; 2 sons live close by  Lives in: House/apartment   Patient Goals: Patient wants more functionality, wants to be able to do more activity     OBJECTIVE:   Patient Surveys  FOTO 44, predicted score 14   Cognition Patient is oriented to person, place, and time.  Recent memory is intact.  Remote memory is intact.  Attention span and concentration are intact.  Expressive speech is intact.  Patient's fund of knowledge is within normal limits for educational level.     Gross Musculoskeletal Assessment Tremor: None Bulk: Normal Tone: Normal No visible step-off along spinal column, no signs of scoliosis. Well healed mid-line incision from old lumbar spinal  fusion.   GAIT: Forward flexed posture through gait cycle, genu valgum increased during stance phase   Posture: Pt shifts weight to L ileum in sitting, FHRS posture Lumbar lordosis: Decreased Lumbar lateral shift: Negative    AROM  AROM (Normal range in degrees) AROM  01/14/2022  Lumbar   Flexion (65) 75% *pain with return to neutral upright position   Extension (30) 25%  Right lateral flexion (25) 75%  Left lateral flexion (25) 75%  Right rotation (30) 75%*  Left rotation (30) 75%      Hip Right Left  Flexion (125) WNL   Extension (15)    Abduction (40)    Adduction     Internal Rotation (45) 15   External Rotation (45) 45       Knee    Flexion (135) WNL   Extension (0) WNL       Ankle    Dorsiflexion (20)    Plantarflexion (50)    Inversion (35)    Eversion (15    (* = pain; Blank rows = not tested)   LE MMT:  MMT (out of 5) Right 01/14/2022 Left 01/14/2022  Hip flexion 4+* 4+  Hip extension    Hip abduction 4-*   Hip adduction    Hip internal  rotation    Hip external rotation    Knee flexion 5* 5  Knee extension 5* 5  Ankle dorsiflexion 5 5  Ankle plantarflexion    Ankle inversion    Ankle eversion    (* = pain; Blank rows = not tested)   Sensation Grossly intact to light touch bilateral LEs as determined by testing dermatomes L2-S2 with exception of sensation loss in bilateral feet. Proprioception, and hot/cold testing deferred on this date.   Reflexes R/L Knee Jerk (L3/4): 2+/2+  Ankle Jerk (S1/2): 1+/1+  Clonus: Negative bilaterally Hoffman's sign: Negative bilaterally   Muscle Length Hamstrings: R: Positive, lacking 20 deg extension L: Positive, lacking 20 deg extension [place] (quadriceps): R: Positive L: Positive   Palpation  Location LEFT  RIGHT           Lumbar paraspinals 2 2  Quadratus Lumborum  2  Gluteus Maximus  2  Gluteus Medius  2  Deep hip external rotators    PSIS 1 1  Fortin's Area (SIJ)  2  Greater Trochanter  1 (mild)  (Blank rows = not tested) Graded on 0-4 scale (0 = no pain, 1 = pain, 2 = pain with wincing/grimacing/flinching, 3 = pain with withdrawal, 4 = unwilling to allow palpation), (Blank rows = not tested)   Passive Accessory Intervertebral Motion Deferred at lumbar spine due to hx of L3-5 fusion    SPECIAL TESTS Lumbar Radiculopathy and Discogenic: Slump (SN 83, -LR 0.32): R: Positive L: Negative SLR (SN 92, -LR 0.29): R: Negative L:  Negative  Facet Joint: Extension-Rotation (SN 100, -LR 0.0): R: Positive L: Negative  Lumbar Foraminal Stenosis: Lumbar quadrant (SN 70): R: Positive (mild), L: Negative  Hip: FABER (SN 81): R: Positive L: Positive FADIR (SN 94): R: Not examined L: Not examined Hip scour (SN 50): R: Negative L: Negative  SIJ:  Thigh Thrust (SN 88, -LR 0.18) : R: Negative L: Negative    TODAY'S TREATMENT   Therapeutic Exercise - for HEP establishment, discussion on appropriate exercise/activity modification, PT education  Reviewed baseline home  exercises and provided handout for Med[employer] program (see Access Code); tactile cueing and therapist demonstration utilized as needed for carryover of proper  technique to HEP.    Patient education on current condition, role of PT, prognosis, plan of care. Discussion on activity modification to prevent flare-up of condition, including temporary limitation of aggravating postures and painful movements.     PATIENT EDUCATION:  Education details: see above for patient education details  Person educated: Patient Education method: Explanation, Demonstration, and Handouts Education comprehension: verbalized understanding and returned demonstration   HOME EXERCISE PROGRAM: Access Code: [email or web address] URL: [email or web address] Date: 01/14/2022 Prepared by: Valentina Gu  Exercises - Supine Lower Trunk Rotation  - 2 x daily - 7 x weekly - 2 sets - 10 reps - Supine Piriformis Stretch with Foot on Ground  - 2 x daily - 7 x weekly - 3 sets - 30sec hold   ASSESSMENT:  CLINICAL IMPRESSION: Patient is a 66 y.o. male who was seen today for physical therapy evaluation and treatment for back pain with Hx of lumbar spinal fusion from 1989 at L3-5 which is intact on radiographs. Pt has primary complaint of R buttock pain at this time following pt attempting to move dog with his foot in while he was in standing position; neural impingement at L3-5 is unexpected given intact hardware without change in position on imaging. Pt has typical degenerative changes expected for his age. Pt has history of long-standing numbness along R anterior thigh (present prior to current injury). Objective impairments include difficulty walking, decreased ROM, decreased strength, impaired flexibility, postural dysfunction, and pain. These impairments are limiting patient from community activity and yard work. Personal factors including Age, Past/current experiences, and 3+ comorbidities: (Type 2 DM, HTN, Hx of L3-5  fusion) are also affecting patient's functional outcome. Patient will benefit from skilled PT to address above impairments and improve overall function.  REHAB POTENTIAL: Good  CLINICAL DECISION MAKING: Evolving/moderate complexity  EVALUATION COMPLEXITY: Moderate   GOALS:  SHORT TERM GOALS: Target date: 01/30/2022  Pt will be independent with HEP to improve strength and decrease back pain to improve pain-free function at home and work. Baseline: 01/09/22: Baseline HEP initiated Goal status: INITIAL   LONG TERM GOALS: Target date: 02/20/2022  Pt will increase FOTO to at least 58 to demonstrate significant improvement in function at home and work related to back pain  Baseline: 01/09/22: 44 Goal status: INITIAL  2.  Pt will decrease worst back pain by at least 2 points on the NPRS in order to demonstrate clinically significant reduction in back pain. Baseline: 01/09/22: Pain 7-8/10 at worst Goal status: INITIAL  3.  Pt will access 75% or greater thoracolumbar AROM without reproduction of symptoms as needed for reaching to floor and self-care activities e.g. bathing/dressing Baseline: 01/09/22: Mild to severe motion loss with all directions, pain with R rotation and return to neutral from lumbar flexion Goal status: INITIAL  4.  Pt will ambulate 150 feet or greater without significant gait deviation/antalgic pattern and symmetrical weightbearing on each LE without reproduction of pain > 1-2/10 as needed for household mobility and short-distance community-level gait Baseline: 01/09/22: Significant pain with walking from his house to backyard Goal status: INITIAL  5.  Pt will sleep through the night without disturbance secondary to back or hip/thigh pain as needed for long-term sleep quality needed for lifelong health Baseline: 01/09/22: Poor sleep quality, pt states he can't sleep due to pain.   Goal status: INITIAL   PLAN: PT FREQUENCY: 2x/week  PT DURATION: 6  weeks  PLANNED INTERVENTIONS: Therapeutic exercises, Therapeutic activity, Neuromuscular re-education, Patient/Family education, Dry Needling, Electrical stimulation, Cryotherapy,  Moist heat, Traction, and Manual therapy  PLAN FOR NEXT SESSION: Manual therapy, STM, modalities as needed for pain control. Will consider trial of DN for primary region of pain along R hip. Quad/hamstrings flexibility. Graded movement as tolerated.    Valentina Gu, PT, DPT #[id number]  Eilleen Kempf 01/14/2022, 12:20 PM

## 2022-01-14 ENCOUNTER — Encounter: Payer: Self-pay | Admitting: Physical Therapy

## 2022-01-14 ENCOUNTER — Ambulatory Visit: Payer: Medicare HMO | Admitting: Physical Therapy

## 2022-01-14 DIAGNOSIS — R262 Difficulty in walking, not elsewhere classified: Secondary | ICD-10-CM

## 2022-01-14 DIAGNOSIS — M6281 Muscle weakness (generalized): Secondary | ICD-10-CM

## 2022-01-14 DIAGNOSIS — M25551 Pain in right hip: Secondary | ICD-10-CM

## 2022-01-14 DIAGNOSIS — M5459 Other low back pain: Secondary | ICD-10-CM

## 2022-01-14 NOTE — Therapy (Incomplete)
OUTPATIENT PHYSICAL THERAPY TREATMENT NOTE   Patient Name: Christopher Escobar MRN: [id number] DOB:06-Nov-1955, 66 y.o., male Today's Date: 01/16/2022   END OF SESSION:   PT End of Session - 01/16/22 0548     Visit Number 2    Number of Visits 13    Date for PT Re-Evaluation 02/20/22    Authorization Type [employer] 2023    Authorization Time Period initial eval 01/09/22    Progress Note Due on Visit 10    PT Start Time 1440    PT Stop Time 1522    PT Time Calculation (min) 42 min    Activity Tolerance Patient limited by pain    Behavior During Therapy [facility] for tasks assessed/performed              Past Medical History:  Diagnosis Date   Diabetes mellitus without complication ([place]) 2440   Hypertension 2008   Personal history of colonic polyps    TA polyp   Sleep apnea    not using cpap   Past Surgical History:  Procedure Laterality Date   AMPUTATION Right 09/15/2019   Procedure: AMPUTATION RIGHT FOOT 2ND DIGIT;  Surgeon: Felipa Furnace, DPM;  Location: [facility];  Service: Podiatry;  Laterality: Right;   AMPUTATION Left 02/08/2021   Procedure: AMPUTATION RAY-Partial 5th Ray;  Surgeon: [name], DPM;  Location: [facility];  Service: Podiatry;  Laterality: Left;   BACK SURGERY  1989   COLONOSCOPY  2013   COLONOSCOPY WITH PROPOFOL N/A 10/28/2016   Procedure: COLONOSCOPY WITH PROPOFOL;  Surgeon: Robert Bellow, MD;  Location: [facility];  Service: Endoscopy;  Laterality: N/A;   SHOULDER ARTHROSCOPY WITH SUBACROMIAL DECOMPRESSION Left 04/02/2017   Procedure: SHOULDER ARTHROSCOPY WITH DECOMPRESSION, DEBRIDEMENT, SLAP REPAIR,;  Surgeon: Corky Mull, MD;  Location: [facility];  Service: Orthopedics;  Laterality: Left;   SHOULDER SURGERY Bilateral 2011,2012   Patient Active Problem List   Diagnosis Date Noted   Hypotension    Benign prostatic hyperplasia without lower urinary tract symptoms    Sepsis without acute organ dysfunction ([place])    Diabetic polyneuropathy  associated with type 2 diabetes mellitus ([place])    Pyogenic inflammation of bone ([place]) 02/07/2021   Right foot infection 09/13/2019   HLD (hyperlipidemia) 09/13/2019   AKI (acute kidney injury) ([place]) 09/13/2019   Hyponatremia 09/13/2019   Class 2 obesity with body mass index (BMI) of 37.0 to 37.9 in adult 09/06/2019   Essential hypertension 06/03/2017   Degenerative joint disease of shoulder, left 12/03/2016   Incomplete tear of left rotator cuff 12/03/2016   Rotator cuff tendinitis, left 12/03/2016   Type 2 diabetes mellitus with hyperlipidemia ([place]) 11/03/2016   History of arthroscopy of left shoulder 11/03/2016   History of colonic polyps 10/08/2016     PCP: Albina Billet, MD   REFERRING PROVIDER: Geronimo Boot, PA-C   REFERRING DIAGNOSIS:  M54.41 (ICD-10-CM) - Bilateral low back pain with right-sided sciatica, unspecified chronicity  M54.16 (ICD-10-CM) - Lumbar radiculopathy  Z98.1 (ICD-10-CM) - S/P lumbar spinal fusion      THERAPY DIAG: No diagnosis found.   RATIONALE FOR EVALUATION AND TREATMENT: Rehabilitation   ONSET DATE: 10/03/21   FOLLOW UP APPT WITH PROVIDER: Yes 02/14/22    PERTINENT HISTORY: Patient is a 66 year old male with primary complaint of low back and R buttock pain.  Patient was seen at [facility] on 8/21/123 for LBP and right buttock pain.  Given medrol dose pack and flexeril on 10/29/21. These  did not help. PCP had him on percocet 7.5 as well. This took the edge off.  History of lumbar fusion L3-L5 in 1989.   He has constant LBP with right buttock and right posterior leg pain to his knee x 2-3 months. He has numbness/tingling in right leg. No left leg pain. Right buttock pain is his worst pain. Pain is worse with sitting. He does okay with walking. Can't sleep at night due to pain. Pt reports recently he was moving dog with his foot an had notable pain along R buttock after that. Patient reports that it hurts to sit on hard surfaces. Patient reports  numbness in R anterior thigh. Pt reports years of anterior thigh numbness prior to this incident involving R hip. Patient reports pain along R SI region that runs into buttock down to gluteal fold. Pt reports he also fell onto his R hip after this, which aggravated the area. Hx of Type 2 DM and hx of partial amputations along L and R foot. Pt reports no bowel/bladder changes. Pt reports disturbed sleep, he reports it is better if he rolls to his L side. Pt reports recent weight loss c trying to lose weight.    Pain:  Pain Intensity: Present: 4/10, Best: /10, Worst: 7-8/10 Pain location: R iliolumbar/R SI region down to R buttock Pain Quality: aching , constant Radiating: Yes to buttock/gluteal fold  Numbness/Tingling: Yes, longstanding numbness in R anterior thigh that seems to have worsened  Focal Weakness: weakness in RLE "once or twice"  Aggravating factors: sitting (especially on hard surface), yard work, lying on his R side, prolonged walking Relieving factors: reclined bed frame (adjustable bed frame), inversion/positional traction, pain medicine  24-hour pain behavior: None How long can you walk: to back yard and back to house  History of prior back injury, pain, surgery, or therapy: Yes, L3-5 fusion in 1989 Falls: Has patient fallen in last 6 months? 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  Red flags (bowel/bladder changes, saddle paresthesia, personal history of cancer, h/o spinal tumors, h/o compression fx, h/o abdominal aneurysm, abdominal pain, chills/fever, night sweats, nausea, vomiting, unrelenting pain, first onset of insidious LBP <20 y/o): Negative   Precautions: None   Weight Bearing Restrictions: No   Living Environment Lives with: lives with their family, pt lives with wife; 2 sons live close by   Lives in: House/apartment     Patient Goals: Patient wants more functionality, wants to be able to do more activity       PRECAUTIONS: S/p L3-5 lumbar fusion in 1989   OBJECTIVE: (objective measures completed at initial evaluation unless otherwise dated)  Patient Surveys  FOTO 92, predicted score 58    Gross Musculoskeletal Assessment Tremor: None Bulk: Normal Tone: Normal No visible step-off along spinal column, no signs of scoliosis. Well healed mid-line incision from old lumbar spinal fusion.     GAIT: Forward flexed posture through gait cycle, genu valgum increased during stance phase     Posture: Pt shifts weight to L ileum in sitting, FHRS posture Lumbar lordosis: Decreased Lumbar lateral shift: Negative       AROM       AROM (Normal range in degrees) AROM  01/14/2022  Lumbar    Flexion (65) 75% *pain with return to neutral upright position   Extension (  30) 25%  Right lateral flexion (25) 75%  Left lateral flexion (25) 75%  Right rotation (30) 75%*  Left rotation (30) 75%         Hip Right Left  Flexion (125) WNL    Extension (15)      Abduction (40)      Adduction       Internal Rotation (45) 15    External Rotation (45) 45           Knee      Flexion (135) WNL    Extension (0) WNL           Ankle      Dorsiflexion (20)      Plantarflexion (50)      Inversion (35)      Eversion (15      (* = pain; Blank rows = not tested)     LE MMT:   MMT (out of 5) Right 01/14/2022 Left 01/14/2022  Hip flexion 4+* 4+  Hip extension      Hip abduction 4-*    Hip adduction      Hip internal rotation      Hip external rotation      Knee flexion 5* 5  Knee extension 5* 5  Ankle dorsiflexion 5 5  Ankle plantarflexion      Ankle inversion      Ankle eversion      (* = pain; Blank rows = not tested)     Sensation Grossly intact to light touch bilateral LEs as determined by testing dermatomes L2-S2 with exception of sensation loss in bilateral  feet. Proprioception, and hot/cold testing deferred on this date.     Reflexes R/L Knee Jerk (L3/4): 2+/2+  Ankle Jerk (S1/2): 1+/1+  Clonus: Negative bilaterally Hoffman's sign: Negative bilaterally     Muscle Length Hamstrings: R: Positive, lacking 20 deg extension L: Positive, lacking 20 deg extension [place] (quadriceps): R: Positive L: Positive     Palpation   Location LEFT  RIGHT           Lumbar paraspinals 2 2  Quadratus Lumborum   2  Gluteus Maximus   2  Gluteus Medius   2  Deep hip external rotators      PSIS 1 1  Fortin's Area (SIJ)   2  Greater Trochanter   1 (mild)  (Blank rows = not tested) Graded on 0-4 scale (0 = no pain, 1 = pain, 2 = pain with wincing/grimacing/flinching, 3 = pain with withdrawal, 4 = unwilling to allow palpation), (Blank rows = not tested)     Passive Accessory Intervertebral Motion Deferred at lumbar spine due to hx of L3-5 fusion     SPECIAL TESTS Lumbar Radiculopathy and Discogenic: Slump (SN 83, -LR 0.32): R: Positive L: Negative SLR (SN 92, -LR 0.29): R: Negative L:  Negative   Facet Joint: Extension-Rotation (SN 100, -LR 0.0): R: Positive L: Negative   Lumbar Foraminal Stenosis: Lumbar quadrant (SN 70): R: Positive (mild), L: Negative   Hip: FABER (SN 81): R: Positive L: Positive FADIR (SN 94): R: Not examined L: Not examined Hip scour (SN 50): R: Negative L: Negative   SIJ:  Thigh Thrust (SN 88, -LR 0.18) : R: Negative L: Negative          TODAY'S TREATMENT     SUBJECTIVE: Patient reports doing well with his initial HEP. Patient reports pain is "about the same." Pt reports a little paresthesia affecting R anterior thigh.  PAIN:  Are you having pain? Yes: NPRS scale: 4/10 Pain location: R buttock   MHP (unbilled) utilized at beginning of session prior to manual therapy for analgesic effect and improved soft tissue extensibility, along lumbosacral region/R gluteus medius in prone lying, 2 pillows under pelvis;  x 5 minutes    Manual Therapy - for symptom modulation, soft tissue sensitivity and mobility, joint mobility, ROM   In prone lying with 2 pillows under pelvis: STM/DTM R gluteus medius/maximus, R piriformis/gemelli compolex, R longissimus lumborum, R quadratus lumborum x 23 minutes    Trigger Point Dry Needling (TDN), unbilled Education performed with patient regarding potential benefit of TDN. Reviewed precautions and risks with patient. Reviewed special precautions/risks over lung fields which include pneumothorax. Reviewed signs and symptoms of pneumothorax and advised pt to go to ER immediately if these symptoms develop advise them of dry needling treatment. Extensive time spent with pt to ensure full understanding of TDN risks. Pt provided verbal consent to treatment. TDN performed to R gluteus medius, R gluteus maximus, R longissimus lumborum at L5 level with 0.30 x 60 single needle placements with strong local twitch response (LTR) in gluteal musculature today, mild reproduction of LBP at longissimus lumborum. Pistoning technique utilized. Improved pain-free motion following intervention.      Therapeutic Exercise - for improved soft tissue flexibility and extensibility as needed for ROM, graded exposure to thoracolumbar ROM  For HEP review Lower trunk rotations; x10 ea dir Piriformis stretch; 3x30 sec, in hooklying       PATIENT EDUCATION:  Education details: see above for patient education details   Person educated: Patient Education method: Explanation, Demonstration, and Handouts Education comprehension: verbalized understanding and returned demonstration     HOME EXERCISE PROGRAM: Access Code: [email or web address] URL: [email or web address] Date: 01/14/2022 Prepared by: Valentina Gu   Exercises - Supine Lower Trunk Rotation  - 2 x daily - 7 x weekly - 2 sets - 10 reps - Supine Piriformis Stretch with Foot on Ground  - 2 x daily - 7 x weekly - 3 sets - 30sec  hold     ASSESSMENT:   CLINICAL IMPRESSION: Patient has notable sensitivity affecting R lower lumbar paraspinal musculature and R gluteus medius/maximus. Utilized DN today for symptom modulation for TrPs along these affected regions with sound twitch response obtained and moderate concordant pain reproduced during needle insertion. Patient tolerates sessions fairly well today with mild post-treatment soreness. Pt has ongoing thoracolumbar AROM motion deficits and positional intolerance for sitting given primary region of pain along R buttock. Patient will benefit from continued skilled PT to address above impairments and improve overall function.   REHAB POTENTIAL: Good   CLINICAL DECISION MAKING: Evolving/moderate complexity   EVALUATION COMPLEXITY: Moderate     GOALS:   SHORT TERM GOALS: Target date: 01/30/2022   Pt will be independent with HEP to improve strength and decrease back pain to improve pain-free function at home and work. Baseline: 01/09/22: Baseline HEP initiated Goal status: INITIAL     LONG TERM GOALS: Target date: 02/20/2022   Pt will increase FOTO to at least 58 to demonstrate significant improvement in function at home and work related to back pain  Baseline: 01/09/22: 44 Goal status: INITIAL   2.  Pt will decrease worst back pain by at least 2 points on the NPRS in order to demonstrate clinically significant reduction in back pain. Baseline: 01/09/22: Pain 7-8/10 at worst Goal status: INITIAL   3.  Pt will access 75% or greater thoracolumbar AROM  without reproduction of symptoms as needed for reaching to floor and self-care activities e.g. bathing/dressing Baseline: 01/09/22: Mild to severe motion loss with all directions, pain with R rotation and return to neutral from lumbar flexion Goal status: INITIAL   4.  Pt will ambulate 150 feet or greater without significant gait deviation/antalgic pattern and symmetrical weightbearing on each LE without reproduction  of pain > 1-2/10 as needed for household mobility and short-distance community-level gait Baseline: 01/09/22: Significant pain with walking from his house to backyard Goal status: INITIAL   5.  Pt will sleep through the night without disturbance secondary to back or hip/thigh pain as needed for long-term sleep quality needed for lifelong health Baseline: 01/09/22: Poor sleep quality, pt states he can't sleep due to pain.   Goal status: INITIAL     PLAN: PT FREQUENCY: 2x/week   PT DURATION: 6 weeks   PLANNED INTERVENTIONS: Therapeutic exercises, Therapeutic activity, Neuromuscular re-education, Patient/Family education, Dry Needling, Electrical stimulation, Cryotherapy, Moist heat, Traction, and Manual therapy   PLAN FOR NEXT SESSION: Manual therapy, STM, modalities as needed for pain control. F/u on response with dry needling; utilize at future sessions as needed. Quad/hamstrings flexibility. Graded movement as tolerated.     Valentina Gu, PT, DPT #[id number]  Eilleen Kempf, PT 01/16/2022, 5:49 AM

## 2022-01-16 ENCOUNTER — Encounter: Payer: Self-pay | Admitting: Physical Therapy

## 2022-01-16 ENCOUNTER — Ambulatory Visit: Payer: Medicare HMO | Admitting: Physical Therapy

## 2022-01-16 DIAGNOSIS — R262 Difficulty in walking, not elsewhere classified: Secondary | ICD-10-CM

## 2022-01-16 DIAGNOSIS — M25551 Pain in right hip: Secondary | ICD-10-CM | POA: Diagnosis not present

## 2022-01-16 DIAGNOSIS — M6281 Muscle weakness (generalized): Secondary | ICD-10-CM

## 2022-01-16 DIAGNOSIS — M5459 Other low back pain: Secondary | ICD-10-CM

## 2022-01-16 NOTE — Therapy (Signed)
OUTPATIENT PHYSICAL THERAPY TREATMENT NOTE   Patient Name: Christopher Escobar MRN: [id number] DOB:09/10/1955, 66 y.o., male Today's Date: 01/16/2022   END OF SESSION:   PT End of Session - 01/16/22 1432     Visit Number 3    Number of Visits 13    Date for PT Re-Evaluation 02/20/22    Authorization Type [employer] 2023    Authorization Time Period initial eval 01/09/22    Progress Note Due on Visit 10    PT Start Time 1345    PT Stop Time 1433    PT Time Calculation (min) 48 min    Activity Tolerance Patient limited by pain    Behavior During Therapy [facility] for tasks assessed/performed               Past Medical History:  Diagnosis Date   Diabetes mellitus without complication ([name]) 0092   Hypertension 2008   Personal history of colonic polyps    TA polyp   Sleep apnea    not using cpap   Past Surgical History:  Procedure Laterality Date   AMPUTATION Right 09/15/2019   Procedure: AMPUTATION RIGHT FOOT 2ND DIGIT;  Surgeon: Felipa Furnace, DPM;  Location: [place];  Service: Podiatry;  Laterality: Right;   AMPUTATION Left 02/08/2021   Procedure: AMPUTATION RAY-Partial 5th Ray;  Surgeon: [name], DPM;  Location: [facility];  Service: Podiatry;  Laterality: Left;   BACK SURGERY  1989   COLONOSCOPY  2013   COLONOSCOPY WITH PROPOFOL N/A 10/28/2016   Procedure: COLONOSCOPY WITH PROPOFOL;  Surgeon: Robert Bellow, MD;  Location: [facility];  Service: Endoscopy;  Laterality: N/A;   SHOULDER ARTHROSCOPY WITH SUBACROMIAL DECOMPRESSION Left 04/02/2017   Procedure: SHOULDER ARTHROSCOPY WITH DECOMPRESSION, DEBRIDEMENT, SLAP REPAIR,;  Surgeon: Corky Mull, MD;  Location: [facility];  Service: Orthopedics;  Laterality: Left;   SHOULDER SURGERY Bilateral 2011,2012   Patient Active Problem List   Diagnosis Date Noted   Hypotension    Benign prostatic hyperplasia without lower urinary tract symptoms    Sepsis without acute organ dysfunction ([place])    Diabetic polyneuropathy  associated with type 2 diabetes mellitus ([place])    Pyogenic inflammation of bone ([place]) 02/07/2021   Right foot infection 09/13/2019   HLD (hyperlipidemia) 09/13/2019   AKI (acute kidney injury) ([place]) 09/13/2019   Hyponatremia 09/13/2019   Class 2 obesity with body mass index (BMI) of 37.0 to 37.9 in adult 09/06/2019   Essential hypertension 06/03/2017   Degenerative joint disease of shoulder, left 12/03/2016   Incomplete tear of left rotator cuff 12/03/2016   Rotator cuff tendinitis, left 12/03/2016   Type 2 diabetes mellitus with hyperlipidemia ([place]) 11/03/2016   History of arthroscopy of left shoulder 11/03/2016   History of colonic polyps 10/08/2016     PCP: Albina Billet, MD   REFERRING PROVIDER: Geronimo Boot, PA-C   REFERRING DIAGNOSIS:  M54.41 (ICD-10-CM) - Bilateral low back pain with right-sided sciatica, unspecified chronicity  M54.16 (ICD-10-CM) - Lumbar radiculopathy  Z98.1 (ICD-10-CM) - S/P lumbar spinal fusion      THERAPY DIAG: No diagnosis found.   RATIONALE FOR EVALUATION AND TREATMENT: Rehabilitation   ONSET DATE: 10/03/21   FOLLOW UP APPT WITH PROVIDER: Yes 02/14/22    PERTINENT HISTORY: Patient is a 66 year old male with primary complaint of low back and R buttock pain.  Patient was seen at [facility] on 8/21/123 for LBP and right buttock pain.  Given medrol dose pack and flexeril on 10/29/21.  These did not help. PCP had him on percocet 7.5 as well. This took the edge off.  History of lumbar fusion L3-L5 in 1989.   He has constant LBP with right buttock and right posterior leg pain to his knee x 2-3 months. He has numbness/tingling in right leg. No left leg pain. Right buttock pain is his worst pain. Pain is worse with sitting. He does okay with walking. Can't sleep at night due to pain. Pt reports recently he was moving dog with his foot an had notable pain along R buttock after that. Patient reports that it hurts to sit on hard surfaces. Patient reports  numbness in R anterior thigh. Pt reports years of anterior thigh numbness prior to this incident involving R hip. Patient reports pain along R SI region that runs into buttock down to gluteal fold. Pt reports he also fell onto his R hip after this, which aggravated the area. Hx of Type 2 DM and hx of partial amputations along L and R foot. Pt reports no bowel/bladder changes. Pt reports disturbed sleep, he reports it is better if he rolls to his L side. Pt reports recent weight loss c trying to lose weight.    Pain:  Pain Intensity: Present: 4/10, Best: /10, Worst: 7-8/10 Pain location: R iliolumbar/R SI region down to R buttock Pain Quality: aching , constant Radiating: Yes to buttock/gluteal fold  Numbness/Tingling: Yes, longstanding numbness in R anterior thigh that seems to have worsened  Focal Weakness: weakness in RLE "once or twice"  Aggravating factors: sitting (especially on hard surface), yard work, lying on his R side, prolonged walking Relieving factors: reclined bed frame (adjustable bed frame), inversion/positional traction, pain medicine  24-hour pain behavior: None How long can you walk: to back yard and back to house  History of prior back injury, pain, surgery, or therapy: Yes, L3-5 fusion in 1989 Falls: Has patient fallen in last 6 months? 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  Red flags (bowel/bladder changes, saddle paresthesia, personal history of cancer, h/o spinal tumors, h/o compression fx, h/o abdominal aneurysm, abdominal pain, chills/fever, night sweats, nausea, vomiting, unrelenting pain, first onset of insidious LBP <20 y/o): Negative   Precautions: None   Weight Bearing Restrictions: No   Living Environment Lives with: lives with their family, pt lives with wife; 2 sons live close by   Lives in: House/apartment     Patient Goals: Patient wants more functionality, wants to be able to do more activity       PRECAUTIONS: S/p L3-5 lumbar fusion in 1989   OBJECTIVE: (objective measures completed at initial evaluation unless otherwise dated)  Patient Surveys  FOTO 48, predicted score 58    Gross Musculoskeletal Assessment Tremor: None Bulk: Normal Tone: Normal No visible step-off along spinal column, no signs of scoliosis. Well healed mid-line incision from old lumbar spinal fusion.     GAIT: Forward flexed posture through gait cycle, genu valgum increased during stance phase     Posture: Pt shifts weight to L ileum in sitting, FHRS posture Lumbar lordosis: Decreased Lumbar lateral shift: Negative       AROM       AROM (Normal range in degrees) AROM  01/14/2022  Lumbar    Flexion (65) 75% *pain with return to neutral upright position  Extension (30) 25%  Right lateral flexion (25) 75%  Left lateral flexion (25) 75%  Right rotation (30) 75%*  Left rotation (30) 75%         Hip Right Left  Flexion (125) WNL    Extension (15)      Abduction (40)      Adduction       Internal Rotation (45) 15    External Rotation (45) 45           Knee      Flexion (135) WNL    Extension (0) WNL           Ankle      Dorsiflexion (20)      Plantarflexion (50)      Inversion (35)      Eversion (15      (* = pain; Blank rows = not tested)     LE MMT:   MMT (out of 5) Right 01/14/2022 Left 01/14/2022  Hip flexion 4+* 4+  Hip extension      Hip abduction 4-*    Hip adduction      Hip internal rotation      Hip external rotation      Knee flexion 5* 5  Knee extension 5* 5  Ankle dorsiflexion 5 5  Ankle plantarflexion      Ankle inversion      Ankle eversion      (* = pain; Blank rows = not tested)     Sensation Grossly intact to light touch bilateral LEs as determined by testing dermatomes L2-S2 with exception of sensation loss in bilateral  feet. Proprioception, and hot/cold testing deferred on this date.     Reflexes R/L Knee Jerk (L3/4): 2+/2+  Ankle Jerk (S1/2): 1+/1+  Clonus: Negative bilaterally Hoffman's sign: Negative bilaterally     Muscle Length Hamstrings: R: Positive, lacking 20 deg extension L: Positive, lacking 20 deg extension [place] (quadriceps): R: Positive L: Positive     Palpation   Location LEFT  RIGHT           Lumbar paraspinals 2 2  Quadratus Lumborum   2  Gluteus Maximus   2  Gluteus Medius   2  Deep hip external rotators      PSIS 1 1  Fortin's Area (SIJ)   2  Greater Trochanter   1 (mild)  (Blank rows = not tested) Graded on 0-4 scale (0 = no pain, 1 = pain, 2 = pain with wincing/grimacing/flinching, 3 = pain with withdrawal, 4 = unwilling to allow palpation), (Blank rows = not tested)     Passive Accessory Intervertebral Motion Deferred at lumbar spine due to hx of L3-5 fusion     SPECIAL TESTS Lumbar Radiculopathy and Discogenic: Slump (SN 83, -LR 0.32): R: Positive L: Negative SLR (SN 92, -LR 0.29): R: Negative L:  Negative   Facet Joint: Extension-Rotation (SN 100, -LR 0.0): R: Positive L: Negative   Lumbar Foraminal Stenosis: Lumbar quadrant (SN 70): R: Positive (mild), L: Negative   Hip: FABER (SN 81): R: Positive L: Positive FADIR (SN 94): R: Not examined L: Not examined Hip scour (SN 50): R: Negative L: Negative   SIJ:  Thigh Thrust (SN 88, -LR 0.18) : R: Negative L: Negative          TODAY'S TREATMENT     SUBJECTIVE: Patient reports similar intensity of pain. He reports feeling more stiff in low back. He reports no significant gluteal pain at arrival. He  feel she may have overdone it with stretching/exercise at home.   PAIN:  Are you having pain? Yes: NPRS scale: 4/10 Pain location: R buttock   MHP (unbilled) utilized at beginning of session prior to manual therapy for analgesic effect and improved soft tissue extensibility, along lumbosacral region/R  gluteus medius in prone lying, 2 pillows under pelvis; x 5 minutes    Manual Therapy - for symptom modulation, soft tissue sensitivity and mobility, joint mobility, ROM   In prone lying with 2 pillows under pelvis: STM/DTM and IASTM with Hypervolt  R and L longissimus lumborum, R quadratus lumborum x 23 minutes    Trigger Point Dry Needling (TDN), unbilled Education performed with patient regarding potential benefit of TDN. Reviewed precautions and risks with patient. Reviewed special precautions/risks over lung fields which include pneumothorax. Reviewed signs and symptoms of pneumothorax and advised pt to go to ER immediately if these symptoms develop advise them of dry needling treatment. Extensive time spent with pt to ensure full understanding of TDN risks. Pt provided verbal consent to treatment. TDN performed to R gluteus medius, R gluteus maximus with 0.30 x 60 single needle placements with strong local twitch response (LTR) in gluteal musculature today, mild reproduction of LBP at longissimus lumborum. Pistoning technique utilized. Improved pain-free motion following intervention.      Therapeutic Exercise - for improved soft tissue flexibility and extensibility as needed for ROM, graded exposure to thoracolumbar ROM  For HEP review Piriformis stretch, figure-4 pull and push; 2x30 sec each, in hooklying    Open book; 1x10 on each side  Glute bridge; 2x10 Physioball rollout, blue ball; x5 ea dir, 5 sec hold   NuStep, 5 minutes, Level 2; utilized post-session for cool down and improved soft tissue extensibility to reduce sensation of stiffness/tightness   -3 minutes unbilled, discussed response to treatment and patient's HEP during 2 minutes of time on bike    PATIENT EDUCATION:  Education details: see above for patient education details   Person educated: Patient Education method: Explanation, Demonstration, and Handouts Education comprehension: verbalized understanding and  returned demonstration     HOME EXERCISE PROGRAM: Access Code: [email or web address] URL: [email or web address] Date: 01/14/2022 Prepared by: Valentina Gu   Exercises - Supine Lower Trunk Rotation  - 2 x daily - 7 x weekly - 2 sets - 10 reps - Supine Piriformis Stretch with Foot on Ground  - 2 x daily - 7 x weekly - 3 sets - 30sec hold     ASSESSMENT:   CLINICAL IMPRESSION: Patient reports notable axial low back pain and notable stiffness with significant volume of exercise performed the previous day. He does report lessening subjective experience of R gluteal pain, but he is unsure if this is solely due to pt noticing more axial low back pain this afternoon. Utilized thermotherapy and STM/IASTM for symptom modulation and 2nd trial of dry needling for R gluteal region. Pt tolerates session well today with report of mild post-dry needling soreness post-treatment.  Pt has ongoing thoracolumbar AROM motion deficits and positional intolerance for sitting given primary region of pain along R buttock. Patient will benefit from continued skilled PT to address above impairments and improve overall function.   REHAB POTENTIAL: Good   CLINICAL DECISION MAKING: Evolving/moderate complexity   EVALUATION COMPLEXITY: Moderate     GOALS:   SHORT TERM GOALS: Target date: 01/30/2022   Pt will be independent with HEP to improve strength and decrease back pain to improve pain-free function at home and work. Baseline:  01/09/22: Baseline HEP initiated Goal status: INITIAL     LONG TERM GOALS: Target date: 02/20/2022   Pt will increase FOTO to at least 58 to demonstrate significant improvement in function at home and work related to back pain  Baseline: 01/09/22: 44 Goal status: INITIAL   2.  Pt will decrease worst back pain by at least 2 points on the NPRS in order to demonstrate clinically significant reduction in back pain. Baseline: 01/09/22: Pain 7-8/10 at worst Goal status: INITIAL    3.  Pt will access 75% or greater thoracolumbar AROM without reproduction of symptoms as needed for reaching to floor and self-care activities e.g. bathing/dressing Baseline: 01/09/22: Mild to severe motion loss with all directions, pain with R rotation and return to neutral from lumbar flexion Goal status: INITIAL   4.  Pt will ambulate 150 feet or greater without significant gait deviation/antalgic pattern and symmetrical weightbearing on each LE without reproduction of pain > 1-2/10 as needed for household mobility and short-distance community-level gait Baseline: 01/09/22: Significant pain with walking from his house to backyard Goal status: INITIAL   5.  Pt will sleep through the night without disturbance secondary to back or hip/thigh pain as needed for long-term sleep quality needed for lifelong health Baseline: 01/09/22: Poor sleep quality, pt states he can't sleep due to pain.   Goal status: INITIAL     PLAN: PT FREQUENCY: 2x/week   PT DURATION: 6 weeks   PLANNED INTERVENTIONS: Therapeutic exercises, Therapeutic activity, Neuromuscular re-education, Patient/Family education, Dry Needling, Electrical stimulation, Cryotherapy, Moist heat, Traction, and Manual therapy   PLAN FOR NEXT SESSION: Manual therapy, STM, modalities as needed for pain control. F/u on response with dry needling; utilize at future sessions as needed. Quad/hamstrings flexibility. Graded movement as tolerated.     Valentina Gu, PT, DPT #[id number]  Eilleen Kempf, PT 01/16/2022, 2:41 PM

## 2022-01-18 ENCOUNTER — Encounter: Payer: Self-pay | Admitting: Physical Therapy

## 2022-01-21 ENCOUNTER — Ambulatory Visit: Payer: Medicare HMO | Admitting: Physical Therapy

## 2022-01-21 DIAGNOSIS — M25551 Pain in right hip: Secondary | ICD-10-CM

## 2022-01-21 DIAGNOSIS — M5459 Other low back pain: Secondary | ICD-10-CM

## 2022-01-21 DIAGNOSIS — M6281 Muscle weakness (generalized): Secondary | ICD-10-CM

## 2022-01-21 DIAGNOSIS — R262 Difficulty in walking, not elsewhere classified: Secondary | ICD-10-CM

## 2022-01-21 NOTE — Therapy (Signed)
OUTPATIENT PHYSICAL THERAPY TREATMENT NOTE   Patient Name: Christopher Escobar MRN: [id number] DOB:09-13-1955, 66 y.o., male Today's Date: 01/21/2022   END OF SESSION:   PT End of Session - 01/21/22 1303     Visit Number 4    Number of Visits 13    Date for PT Re-Evaluation 02/20/22    Authorization Type [employer] 2023    Authorization Time Period initial eval 01/09/22    Progress Note Due on Visit 10    PT Start Time 1300    PT Stop Time 1347    PT Time Calculation (min) 47 min    Activity Tolerance Patient limited by pain    Behavior During Therapy [facility] for tasks assessed/performed                Past Medical History:  Diagnosis Date   Diabetes mellitus without complication ([place]) 6295   Hypertension 2008   Personal history of colonic polyps    TA polyp   Sleep apnea    not using cpap   Past Surgical History:  Procedure Laterality Date   AMPUTATION Right 09/15/2019   Procedure: AMPUTATION RIGHT FOOT 2ND DIGIT;  Surgeon: Felipa Furnace, DPM;  Location: [facility];  Service: Podiatry;  Laterality: Right;   AMPUTATION Left 02/08/2021   Procedure: AMPUTATION RAY-Partial 5th Ray;  Surgeon: [name], DPM;  Location: [facility];  Service: Podiatry;  Laterality: Left;   BACK SURGERY  1989   COLONOSCOPY  2013   COLONOSCOPY WITH PROPOFOL N/A 10/28/2016   Procedure: COLONOSCOPY WITH PROPOFOL;  Surgeon: Robert Bellow, MD;  Location: [facility];  Service: Endoscopy;  Laterality: N/A;   SHOULDER ARTHROSCOPY WITH SUBACROMIAL DECOMPRESSION Left 04/02/2017   Procedure: SHOULDER ARTHROSCOPY WITH DECOMPRESSION, DEBRIDEMENT, SLAP REPAIR,;  Surgeon: Corky Mull, MD;  Location: [facility];  Service: Orthopedics;  Laterality: Left;   SHOULDER SURGERY Bilateral 2011,2012   Patient Active Problem List   Diagnosis Date Noted   Hypotension    Benign prostatic hyperplasia without lower urinary tract symptoms    Sepsis without acute organ dysfunction ([place])    Diabetic polyneuropathy  associated with type 2 diabetes mellitus ([place])    Pyogenic inflammation of bone ([place]) 02/07/2021   Right foot infection 09/13/2019   HLD (hyperlipidemia) 09/13/2019   AKI (acute kidney injury) ([place]) 09/13/2019   Hyponatremia 09/13/2019   Class 2 obesity with body mass index (BMI) of 37.0 to 37.9 in adult 09/06/2019   Essential hypertension 06/03/2017   Degenerative joint disease of shoulder, left 12/03/2016   Incomplete tear of left rotator cuff 12/03/2016   Rotator cuff tendinitis, left 12/03/2016   Type 2 diabetes mellitus with hyperlipidemia ([place]) 11/03/2016   History of arthroscopy of left shoulder 11/03/2016   History of colonic polyps 10/08/2016     PCP: Albina Billet, MD   REFERRING PROVIDER: Geronimo Boot, PA-C   REFERRING DIAGNOSIS:  M54.41 (ICD-10-CM) - Bilateral low back pain with right-sided sciatica, unspecified chronicity  M54.16 (ICD-10-CM) - Lumbar radiculopathy  Z98.1 (ICD-10-CM) - S/P lumbar spinal fusion      THERAPY DIAG: No diagnosis found.   RATIONALE FOR EVALUATION AND TREATMENT: Rehabilitation   ONSET DATE: 10/03/21   FOLLOW UP APPT WITH PROVIDER: Yes 02/14/22    PERTINENT HISTORY: Patient is a 66 year old male with primary complaint of low back and R buttock pain.  Patient was seen at [facility] on 8/21/123 for LBP and right buttock pain.  Given medrol dose pack and flexeril on  10/29/21. These did not help. PCP had him on percocet 7.5 as well. This took the edge off.  History of lumbar fusion L3-L5 in 1989.   He has constant LBP with right buttock and right posterior leg pain to his knee x 2-3 months. He has numbness/tingling in right leg. No left leg pain. Right buttock pain is his worst pain. Pain is worse with sitting. He does okay with walking. Can't sleep at night due to pain. Pt reports recently he was moving dog with his foot an had notable pain along R buttock after that. Patient reports that it hurts to sit on hard surfaces. Patient reports  numbness in R anterior thigh. Pt reports years of anterior thigh numbness prior to this incident involving R hip. Patient reports pain along R SI region that runs into buttock down to gluteal fold. Pt reports he also fell onto his R hip after this, which aggravated the area. Hx of Type 2 DM and hx of partial amputations along L and R foot. Pt reports no bowel/bladder changes. Pt reports disturbed sleep, he reports it is better if he rolls to his L side. Pt reports recent weight loss c trying to lose weight.    Pain:  Pain Intensity: Present: 4/10, Best: /10, Worst: 7-8/10 Pain location: R iliolumbar/R SI region down to R buttock Pain Quality: aching , constant Radiating: Yes to buttock/gluteal fold  Numbness/Tingling: Yes, longstanding numbness in R anterior thigh that seems to have worsened  Focal Weakness: weakness in RLE "once or twice"  Aggravating factors: sitting (especially on hard surface), yard work, lying on his R side, prolonged walking Relieving factors: reclined bed frame (adjustable bed frame), inversion/positional traction, pain medicine  24-hour pain behavior: None How long can you walk: to back yard and back to house  History of prior back injury, pain, surgery, or therapy: Yes, L3-5 fusion in 1989 Falls: Has patient fallen in last 6 months? 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  Red flags (bowel/bladder changes, saddle paresthesia, personal history of cancer, h/o spinal tumors, h/o compression fx, h/o abdominal aneurysm, abdominal pain, chills/fever, night sweats, nausea, vomiting, unrelenting pain, first onset of insidious LBP <18 y/o): Negative   Precautions: None   Weight Bearing Restrictions: No   Living Environment Lives with: lives with their family, pt lives with wife; 2 sons live close by   Lives in: House/apartment     Patient Goals: Patient wants more functionality, wants to be able to do more activity       PRECAUTIONS: S/p L3-5 lumbar fusion in 1989   OBJECTIVE: (objective measures completed at initial evaluation unless otherwise dated)  Patient Surveys  FOTO 50, predicted score 58    Gross Musculoskeletal Assessment Tremor: None Bulk: Normal Tone: Normal No visible step-off along spinal column, no signs of scoliosis. Well healed mid-line incision from old lumbar spinal fusion.     GAIT: Forward flexed posture through gait cycle, genu valgum increased during stance phase     Posture: Pt shifts weight to L ileum in sitting, FHRS posture Lumbar lordosis: Decreased Lumbar lateral shift: Negative       AROM       AROM (Normal range in degrees) AROM  01/14/2022  Lumbar    Flexion (65) 75% *pain with return to neutral upright position  Extension (30) 25%  Right lateral flexion (25) 75%  Left lateral flexion (25) 75%  Right rotation (30) 75%*  Left rotation (30) 75%         Hip Right Left  Flexion (125) WNL    Extension (15)      Abduction (40)      Adduction       Internal Rotation (45) 15    External Rotation (45) 45           Knee      Flexion (135) WNL    Extension (0) WNL           Ankle      Dorsiflexion (20)      Plantarflexion (50)      Inversion (35)      Eversion (15      (* = pain; Blank rows = not tested)     LE MMT:   MMT (out of 5) Right 01/14/2022 Left 01/14/2022  Hip flexion 4+* 4+  Hip extension      Hip abduction 4-*    Hip adduction      Hip internal rotation      Hip external rotation      Knee flexion 5* 5  Knee extension 5* 5  Ankle dorsiflexion 5 5  Ankle plantarflexion      Ankle inversion      Ankle eversion      (* = pain; Blank rows = not tested)     Sensation Grossly intact to light touch bilateral LEs as determined by testing dermatomes L2-S2 with exception of sensation loss in bilateral  feet. Proprioception, and hot/cold testing deferred on this date.     Reflexes R/L Knee Jerk (L3/4): 2+/2+  Ankle Jerk (S1/2): 1+/1+  Clonus: Negative bilaterally Hoffman's sign: Negative bilaterally     Muscle Length Hamstrings: R: Positive, lacking 20 deg extension L: Positive, lacking 20 deg extension [place] (quadriceps): R: Positive L: Positive     Palpation   Location LEFT  RIGHT           Lumbar paraspinals 2 2  Quadratus Lumborum   2  Gluteus Maximus   2  Gluteus Medius   2  Deep hip external rotators      PSIS 1 1  Fortin's Area (SIJ)   2  Greater Trochanter   1 (mild)  (Blank rows = not tested) Graded on 0-4 scale (0 = no pain, 1 = pain, 2 = pain with wincing/grimacing/flinching, 3 = pain with withdrawal, 4 = unwilling to allow palpation), (Blank rows = not tested)     Passive Accessory Intervertebral Motion Deferred at lumbar spine due to hx of L3-5 fusion     SPECIAL TESTS Lumbar Radiculopathy and Discogenic: Slump (SN 83, -LR 0.32): R: Positive L: Negative SLR (SN 92, -LR 0.29): R: Negative L:  Negative   Facet Joint: Extension-Rotation (SN 100, -LR 0.0): R: Positive L: Negative   Lumbar Foraminal Stenosis: Lumbar quadrant (SN 70): R: Positive (mild), L: Negative   Hip: FABER (SN 81): R: Positive L: Positive FADIR (SN 94): R: Not examined L: Not examined Hip scour (SN 50): R: Negative L: Negative   SIJ:  Thigh Thrust (SN 88, -LR 0.18) : R: Negative L: Negative          TODAY'S TREATMENT     SUBJECTIVE: Patient reports his symptoms might be "easing up just a little bit." He reports tolerating last session well. Patient reports compliance with his  HEP. 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   PAIN:  Are you having pain? Yes: NPRS scale: 3.5/10 Pain location: R buttock   MHP (unbilled) utilized at beginning of session prior to manual therapy for analgesic effect and improved soft tissue extensibility, along  lumbosacral region/R gluteus medius in prone lying, 2 pillows under pelvis; x 5 minutes    Manual Therapy - for symptom modulation, soft tissue sensitivity and mobility, joint mobility, ROM   In prone lying with 2 pillows under pelvis: STM/DTM and IASTM with Hypervolt  R and L longissimus lumborum, R quadratus lumborum, R gluteus medius and maximus x 20 minutes    Trigger Point Dry Needling (TDN), unbilled Education performed with patient regarding potential benefit of TDN. Reviewed precautions and risks with patient. Reviewed special precautions/risks over lung fields which include pneumothorax. Reviewed signs and symptoms of pneumothorax and advised pt to go to ER immediately if these symptoms develop advise them of dry needling treatment. Extensive time spent with pt to ensure full understanding of TDN risks. Pt provided verbal consent to treatment. TDN performed to R gluteus medius, R gluteus maximus with 0.30 x 60 single needle placements with strong local twitch response (LTR) in gluteal musculature today, mild reproduction of LBP at longissimus lumborum. Pistoning technique utilized. Improved pain-free motion following intervention.      Therapeutic Exercise - for improved soft tissue flexibility and extensibility as needed for ROM, graded exposure to thoracolumbar ROM  NuStep, 5 minutes, Level 2; utilized post-session for cool down and improved soft tissue extensibility to reduce sensation of stiffness/tightness -2 minutes unbilled, subjective information gathered during this time   Lower trunk rotations, quadratus lumborum bias (figure-4 position); x10, 3 sec hold  Piriformis stretch, figure-4 pull and push; 2x30 sec each, in hooklying   Glute bridge; 2x10 Hip abduction isometric; x10, 10 sec   Physioball rollout, blue ball; x5 ea dir, 5 sec hold    *not today* Open book; 1x10 on each side      PATIENT EDUCATION:  Education details: see above for patient education details    Person educated: Patient Education method: Explanation, Demonstration, and Handouts Education comprehension: verbalized understanding and returned demonstration     HOME EXERCISE PROGRAM: Access Code: [email or web address] URL: [email or web address] Date: 01/14/2022 Prepared by: Valentina Gu   Exercises - Supine Lower Trunk Rotation  - 2 x daily - 7 x weekly - 2 sets - 10 reps - Supine Piriformis Stretch with Foot on Ground  - 2 x daily - 7 x weekly - 3 sets - 30sec hold     ASSESSMENT:   CLINICAL IMPRESSION: Patient reports notable axial low back pain and notable stiffness with significant volume of exercise performed the previous day. He does report lessening subjective experience of R gluteal pain, but he is unsure if this is solely due to pt noticing more axial low back pain this afternoon. Utilized thermotherapy and STM/IASTM for symptom modulation and 2nd trial of dry needling for R gluteal region. Pt tolerates session well today with report of mild post-dry needling soreness post-treatment.  Pt has ongoing thoracolumbar AROM motion deficits and positional intolerance for sitting given primary region of pain along R buttock. Patient will benefit from continued skilled PT to address above impairments and improve overall function.   REHAB POTENTIAL: Good   CLINICAL DECISION MAKING: Evolving/moderate complexity   EVALUATION COMPLEXITY: Moderate     GOALS:   SHORT TERM GOALS: Target date: 01/30/2022   Pt will be independent  with HEP to improve strength and decrease back pain to improve pain-free function at home and work. Baseline: 01/09/22: Baseline HEP initiated Goal status: INITIAL     LONG TERM GOALS: Target date: 02/20/2022   Pt will increase FOTO to at least 58 to demonstrate significant improvement in function at home and work related to back pain  Baseline: 01/09/22: 44 Goal status: INITIAL   2.  Pt will decrease worst back pain by at least 2 points on the  NPRS in order to demonstrate clinically significant reduction in back pain. Baseline: 01/09/22: Pain 7-8/10 at worst Goal status: INITIAL   3.  Pt will access 75% or greater thoracolumbar AROM without reproduction of symptoms as needed for reaching to floor and self-care activities e.g. bathing/dressing Baseline: 01/09/22: Mild to severe motion loss with all directions, pain with R rotation and return to neutral from lumbar flexion Goal status: INITIAL   4.  Pt will ambulate 150 feet or greater without significant gait deviation/antalgic pattern and symmetrical weightbearing on each LE without reproduction of pain > 1-2/10 as needed for household mobility and short-distance community-level gait Baseline: 01/09/22: Significant pain with walking from his house to backyard Goal status: INITIAL   5.  Pt will sleep through the night without disturbance secondary to back or hip/thigh pain as needed for long-term sleep quality needed for lifelong health Baseline: 01/09/22: Poor sleep quality, pt states he can't sleep due to pain.  Goal status: INITIAL     PLAN: PT FREQUENCY: 2x/week   PT DURATION: 6 weeks   PLANNED INTERVENTIONS: Therapeutic exercises, Therapeutic activity, Neuromuscular re-education, Patient/Family education, Dry Needling, Electrical stimulation, Cryotherapy, Moist heat, Traction, and Manual therapy   PLAN FOR NEXT SESSION: Manual therapy, STM, modalities as needed for pain control. F/u on response with dry needling; utilize at future sessions as needed. Quad/hamstrings flexibility. Graded movement as tolerated.     Valentina Gu, PT, DPT #[id number]  Eilleen Kempf, PT 01/21/2022, 1:47 PM

## 2022-01-23 ENCOUNTER — Encounter: Payer: Self-pay | Admitting: Physical Therapy

## 2022-01-23 ENCOUNTER — Ambulatory Visit: Payer: Medicare HMO | Admitting: Physical Therapy

## 2022-01-23 DIAGNOSIS — M5459 Other low back pain: Secondary | ICD-10-CM

## 2022-01-23 DIAGNOSIS — M25551 Pain in right hip: Secondary | ICD-10-CM

## 2022-01-23 DIAGNOSIS — M6281 Muscle weakness (generalized): Secondary | ICD-10-CM

## 2022-01-23 DIAGNOSIS — R262 Difficulty in walking, not elsewhere classified: Secondary | ICD-10-CM

## 2022-01-23 NOTE — Therapy (Signed)
OUTPATIENT PHYSICAL THERAPY TREATMENT NOTE   Patient Name: Christopher Escobar MRN: [id number] DOB:04-Jan-1956, 66 y.o., male Today's Date: 01/23/2022   END OF SESSION:   PT End of Session - 01/23/22 1305     Visit Number 5    Number of Visits 13    Date for PT Re-Evaluation 02/20/22    Authorization Type [employer] 2023    Authorization Time Period initial eval 01/09/22    Progress Note Due on Visit 10    PT Start Time 1300    PT Stop Time 1345    PT Time Calculation (min) 45 min    Activity Tolerance Patient limited by pain    Behavior During Therapy [facility] for tasks assessed/performed                 Past Medical History:  Diagnosis Date   Diabetes mellitus without complication ([place]) 6295   Hypertension 2008   Personal history of colonic polyps    TA polyp   Sleep apnea    not using cpap   Past Surgical History:  Procedure Laterality Date   AMPUTATION Right 09/15/2019   Procedure: AMPUTATION RIGHT FOOT 2ND DIGIT;  Surgeon: Felipa Furnace, DPM;  Location: [place];  Service: Podiatry;  Laterality: Right;   AMPUTATION Left 02/08/2021   Procedure: AMPUTATION RAY-Partial 5th Ray;  Surgeon: [name], DPM;  Location: [facility];  Service: Podiatry;  Laterality: Left;   BACK SURGERY  1989   COLONOSCOPY  2013   COLONOSCOPY WITH PROPOFOL N/A 10/28/2016   Procedure: COLONOSCOPY WITH PROPOFOL;  Surgeon: Robert Bellow, MD;  Location: [facility];  Service: Endoscopy;  Laterality: N/A;   SHOULDER ARTHROSCOPY WITH SUBACROMIAL DECOMPRESSION Left 04/02/2017   Procedure: SHOULDER ARTHROSCOPY WITH DECOMPRESSION, DEBRIDEMENT, SLAP REPAIR,;  Surgeon: Corky Mull, MD;  Location: [facility];  Service: Orthopedics;  Laterality: Left;   SHOULDER SURGERY Bilateral 2011,2012   Patient Active Problem List   Diagnosis Date Noted   Hypotension    Benign prostatic hyperplasia without lower urinary tract symptoms    Sepsis without acute organ dysfunction ([place])    Diabetic polyneuropathy  associated with type 2 diabetes mellitus ([place])    Pyogenic inflammation of bone ([place]) 02/07/2021   Right foot infection 09/13/2019   HLD (hyperlipidemia) 09/13/2019   AKI (acute kidney injury) ([place]) 09/13/2019   Hyponatremia 09/13/2019   Class 2 obesity with body mass index (BMI) of 37.0 to 37.9 in adult 09/06/2019   Essential hypertension 06/03/2017   Degenerative joint disease of shoulder, left 12/03/2016   Incomplete tear of left rotator cuff 12/03/2016   Rotator cuff tendinitis, left 12/03/2016   Type 2 diabetes mellitus with hyperlipidemia ([place]) 11/03/2016   History of arthroscopy of left shoulder 11/03/2016   History of colonic polyps 10/08/2016     PCP: Albina Billet, MD   REFERRING PROVIDER: Geronimo Boot, PA-C   REFERRING DIAGNOSIS:  M54.41 (ICD-10-CM) - Bilateral low back pain with right-sided sciatica, unspecified chronicity  M54.16 (ICD-10-CM) - Lumbar radiculopathy  Z98.1 (ICD-10-CM) - S/P lumbar spinal fusion      THERAPY DIAG: No diagnosis found.   RATIONALE FOR EVALUATION AND TREATMENT: Rehabilitation   ONSET DATE: 10/03/21   FOLLOW UP APPT WITH PROVIDER: Yes 02/14/22    PERTINENT HISTORY: Patient is a 66 year old male with primary complaint of low back and R buttock pain.  Patient was seen at [facility] on 8/21/123 for LBP and right buttock pain.  Given medrol dose pack and flexeril  on 10/29/21. These did not help. PCP had him on percocet 7.5 as well. This took the edge off.  History of lumbar fusion L3-L5 in 1989.   He has constant LBP with right buttock and right posterior leg pain to his knee x 2-3 months. He has numbness/tingling in right leg. No left leg pain. Right buttock pain is his worst pain. Pain is worse with sitting. He does okay with walking. Can't sleep at night due to pain. Pt reports recently he was moving dog with his foot an had notable pain along R buttock after that. Patient reports that it hurts to sit on hard surfaces. Patient reports  numbness in R anterior thigh. Pt reports years of anterior thigh numbness prior to this incident involving R hip. Patient reports pain along R SI region that runs into buttock down to gluteal fold. Pt reports he also fell onto his R hip after this, which aggravated the area. Hx of Type 2 DM and hx of partial amputations along L and R foot. Pt reports no bowel/bladder changes. Pt reports disturbed sleep, he reports it is better if he rolls to his L side. Pt reports recent weight loss c trying to lose weight.    Pain:  Pain Intensity: Present: 4/10, Best: /10, Worst: 7-8/10 Pain location: R iliolumbar/R SI region down to R buttock Pain Quality: aching , constant Radiating: Yes to buttock/gluteal fold  Numbness/Tingling: Yes, longstanding numbness in R anterior thigh that seems to have worsened  Focal Weakness: weakness in RLE "once or twice"  Aggravating factors: sitting (especially on hard surface), yard work, lying on his R side, prolonged walking Relieving factors: reclined bed frame (adjustable bed frame), inversion/positional traction, pain medicine  24-hour pain behavior: None How long can you walk: to back yard and back to house  History of prior back injury, pain, surgery, or therapy: Yes, L3-5 fusion in 1989 Falls: Has patient fallen in last 6 months? 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  Red flags (bowel/bladder changes, saddle paresthesia, personal history of cancer, h/o spinal tumors, h/o compression fx, h/o abdominal aneurysm, abdominal pain, chills/fever, night sweats, nausea, vomiting, unrelenting pain, first onset of insidious LBP <20 y/o): Negative   Precautions: None   Weight Bearing Restrictions: No   Living Environment Lives with: lives with their family, pt lives with wife; 2 sons live close by   Lives in: House/apartment     Patient Goals: Patient wants more functionality, wants to be able to do more activity       PRECAUTIONS: S/p L3-5 lumbar fusion in 1989   OBJECTIVE: (objective measures completed at initial evaluation unless otherwise dated)  Patient Surveys  FOTO 38, predicted score 58    Gross Musculoskeletal Assessment Tremor: None Bulk: Normal Tone: Normal No visible step-off along spinal column, no signs of scoliosis. Well healed mid-line incision from old lumbar spinal fusion.     GAIT: Forward flexed posture through gait cycle, genu valgum increased during stance phase     Posture: Pt shifts weight to L ileum in sitting, FHRS posture Lumbar lordosis: Decreased Lumbar lateral shift: Negative       AROM       AROM (Normal range in degrees) AROM  01/14/2022  Lumbar    Flexion (65) 75% *pain with return to neutral upright position  Extension (30) 25%  Right lateral flexion (25) 75%  Left lateral flexion (25) 75%  Right rotation (30) 75%*  Left rotation (30) 75%         Hip Right Left  Flexion (125) WNL    Extension (15)      Abduction (40)      Adduction       Internal Rotation (45) 15    External Rotation (45) 45           Knee      Flexion (135) WNL    Extension (0) WNL           Ankle      Dorsiflexion (20)      Plantarflexion (50)      Inversion (35)      Eversion (15      (* = pain; Blank rows = not tested)     LE MMT:   MMT (out of 5) Right 01/14/2022 Left 01/14/2022  Hip flexion 4+* 4+  Hip extension      Hip abduction 4-*    Hip adduction      Hip internal rotation      Hip external rotation      Knee flexion 5* 5  Knee extension 5* 5  Ankle dorsiflexion 5 5  Ankle plantarflexion      Ankle inversion      Ankle eversion      (* = pain; Blank rows = not tested)     Sensation Grossly intact to light touch bilateral LEs as determined by testing dermatomes L2-S2 with exception of sensation loss in bilateral  feet. Proprioception, and hot/cold testing deferred on this date.     Reflexes R/L Knee Jerk (L3/4): 2+/2+  Ankle Jerk (S1/2): 1+/1+  Clonus: Negative bilaterally Hoffman's sign: Negative bilaterally     Muscle Length Hamstrings: R: Positive, lacking 20 deg extension L: Positive, lacking 20 deg extension [place] (quadriceps): R: Positive L: Positive     Palpation   Location LEFT  RIGHT           Lumbar paraspinals 2 2  Quadratus Lumborum   2  Gluteus Maximus   2  Gluteus Medius   2  Deep hip external rotators      PSIS 1 1  Fortin's Area (SIJ)   2  Greater Trochanter   1 (mild)  (Blank rows = not tested) Graded on 0-4 scale (0 = no pain, 1 = pain, 2 = pain with wincing/grimacing/flinching, 3 = pain with withdrawal, 4 = unwilling to allow palpation), (Blank rows = not tested)     Passive Accessory Intervertebral Motion Deferred at lumbar spine due to hx of L3-5 fusion     SPECIAL TESTS Lumbar Radiculopathy and Discogenic: Slump (SN 83, -LR 0.32): R: Positive L: Negative SLR (SN 92, -LR 0.29): R: Negative L:  Negative   Facet Joint: Extension-Rotation (SN 100, -LR 0.0): R: Positive L: Negative   Lumbar Foraminal Stenosis: Lumbar quadrant (SN 70): R: Positive (mild), L: Negative   Hip: FABER (SN 81): R: Positive L: Positive FADIR (SN 94): R: Not examined L: Not examined Hip scour (SN 50): R: Negative L: Negative   SIJ:  Thigh Thrust (SN 88, -LR 0.18) : R: Negative L: Negative          TODAY'S TREATMENT     SUBJECTIVE: Patient reports 4/10 pain at arrival. Pain affecting shoulders and his low back after raking and blowing leaves yesterday. He reports that R  gluteal region is "not that bad" at arrival. Patient reports mild soreness after last visit; he reports soreness for about 1 hour afterward. Pt reports compliance with HEP.   PAIN:  Are you having pain? Yes: NPRS scale: 4/10 Pain location: R buttock   MHP (unbilled) utilized at beginning of session  prior to manual therapy for analgesic effect and improved soft tissue extensibility, along lumbosacral region/R gluteus medius in prone lying, 2 pillows under pelvis; x 5 minutes    Manual Therapy - for symptom modulation, soft tissue sensitivity and mobility, joint mobility, ROM   In prone lying with 2 pillows under pelvis: STM/DTM and IASTM with Hypervolt  R and L longissimus lumborum, R quadratus lumborum, R gluteus medius and maximus x 15 minutes   Therapeutic Exercise - for improved soft tissue flexibility and extensibility as needed for ROM, graded exposure to thoracolumbar ROM  NuStep, 5 minutes, Level 3; utilized for improved soft tissue extensibility to reduce sensation of stiffness/tightness -2 minutes unbilled, subjective information gathered during this time   Lower trunk rotations; x10 ea dir, hooklying Lower trunk rotations, quadratus lumborum bias (figure-4 position); x10, 3 sec hold  Piriformis stretch, figure-4 pull and push; 2x30 sec each, in hooklying   Glute bridge; 2x10   Sidelying clamshelll; 2x10, Red Tband; tactile and verbal cueing to limit trunk rotation compensation  Sit to stand; with goblet hold c medicine ball; 6-lb Medball; 2x10   -verbal cueing and demo for posture Standing hip abduction, Red Tband; 2x10, performed on each LE; light touch on edge of treatment table for balance  -verbal cueing and demonstration to limit hip hike and contralateral thoracolumbar sidebend versus hip abduction    *not today* Physioball rollout, blue ball; x5 ea dir, 5 sec hold  Hip abduction isometric; x10, 10 sec Open book; 1x10 on each side      PATIENT EDUCATION:  Education details: see above for patient education details   Person educated: Patient Education method: Explanation, Demonstration, and Handouts Education comprehension: verbalized understanding and returned demonstration     HOME EXERCISE PROGRAM: Access Code: [email or web address] URL:  [email or web address] Date: 01/14/2022 Prepared by: Valentina Gu   Exercises - Supine Lower Trunk Rotation  - 2 x daily - 7 x weekly - 2 sets - 10 reps - Supine Piriformis Stretch with Foot on Ground  - 2 x daily - 7 x weekly - 3 sets - 30sec hold     ASSESSMENT:   CLINICAL IMPRESSION: Patient fortunately has mild symptoms affecting R posterolateral gluteal region at this time. He has ongoing R lumbar paraspinal pain that can worsen with repeated flexion-based activities. He is able to progress with graded loading to hip abductors/external rotators today (increase in load versus isometrics initiated last visit) without significant c/o increased R lower quarter pain. Pt fortunately does seem to be doing better with R gluteal pain, but R>L-sided low back pain may be more difficult to manage given chronicity and Hx of lumbar fusion. Patient will benefit from continued skilled PT to address above impairments and improve overall function.   REHAB POTENTIAL: Good   CLINICAL DECISION MAKING: Evolving/moderate complexity   EVALUATION COMPLEXITY: Moderate     GOALS:   SHORT TERM GOALS: Target date: 01/30/2022   Pt will be independent with HEP to improve strength and decrease back pain to improve pain-free function at home and work. Baseline: 01/09/22: Baseline HEP initiated Goal status: INITIAL     LONG TERM GOALS: Target date: 02/20/2022   Pt will increase  FOTO to at least 25 to demonstrate significant improvement in function at home and work related to back pain  Baseline: 01/09/22: 44 Goal status: INITIAL   2.  Pt will decrease worst back pain by at least 2 points on the NPRS in order to demonstrate clinically significant reduction in back pain. Baseline: 01/09/22: Pain 7-8/10 at worst Goal status: INITIAL   3.  Pt will access 75% or greater thoracolumbar AROM without reproduction of symptoms as needed for reaching to floor and self-care activities e.g.  bathing/dressing Baseline: 01/09/22: Mild to severe motion loss with all directions, pain with R rotation and return to neutral from lumbar flexion Goal status: INITIAL   4.  Pt will ambulate 150 feet or greater without significant gait deviation/antalgic pattern and symmetrical weightbearing on each LE without reproduction of pain > 1-2/10 as needed for household mobility and short-distance community-level gait Baseline: 01/09/22: Significant pain with walking from his house to backyard Goal status: INITIAL   5.  Pt will sleep through the night without disturbance secondary to back or hip/thigh pain as needed for long-term sleep quality needed for lifelong health Baseline: 01/09/22: Poor sleep quality, pt states he can't sleep due to pain.  Goal status: INITIAL     PLAN: PT FREQUENCY: 2x/week   PT DURATION: 6 weeks   PLANNED INTERVENTIONS: Therapeutic exercises, Therapeutic activity, Neuromuscular re-education, Patient/Family education, Dry Needling, Electrical stimulation, Cryotherapy, Moist heat, Traction, and Manual therapy   PLAN FOR NEXT SESSION: Manual therapy, STM, modalities as needed for pain control. F/u on response with dry needling; utilize at future sessions as needed. Quad/hamstrings flexibility. Graded movement as tolerated.     Valentina Gu, PT, DPT #[id number]  Eilleen Kempf, PT 01/23/2022, 1:06 PM

## 2022-01-27 ENCOUNTER — Encounter (INDEPENDENT_AMBULATORY_CARE_PROVIDER_SITE_OTHER): Payer: Self-pay

## 2022-01-28 ENCOUNTER — Ambulatory Visit: Payer: Medicare HMO | Admitting: Physical Therapy

## 2022-01-30 ENCOUNTER — Ambulatory Visit: Payer: Medicare HMO | Attending: Orthopedic Surgery | Admitting: Physical Therapy

## 2022-01-30 DIAGNOSIS — M5459 Other low back pain: Secondary | ICD-10-CM | POA: Insufficient documentation

## 2022-01-30 DIAGNOSIS — M25551 Pain in right hip: Secondary | ICD-10-CM | POA: Diagnosis present

## 2022-01-30 DIAGNOSIS — M6281 Muscle weakness (generalized): Secondary | ICD-10-CM | POA: Diagnosis present

## 2022-01-30 DIAGNOSIS — R262 Difficulty in walking, not elsewhere classified: Secondary | ICD-10-CM | POA: Diagnosis present

## 2022-01-30 NOTE — Therapy (Signed)
OUTPATIENT PHYSICAL THERAPY TREATMENT NOTE   Patient Name: Christopher Escobar MRN: [id number] DOB:September 18, 1955, 66 y.o., male Today's Date: 02/03/2022   END OF SESSION:   PT End of Session - 02/03/22 1004     Visit Number 6    Number of Visits 13    Date for PT Re-Evaluation 02/20/22    Authorization Type [employer] 2023    Authorization Time Period initial eval 01/09/22    Progress Note Due on Visit 10    PT Start Time 1302    PT Stop Time 1345    PT Time Calculation (min) 43 min    Activity Tolerance Patient limited by pain    Behavior During Therapy [facility] for tasks assessed/performed              Past Medical History:  Diagnosis Date   Diabetes mellitus without complication ([place]) 6269   Hypertension 2008   Personal history of colonic polyps    TA polyp   Sleep apnea    not using cpap   Past Surgical History:  Procedure Laterality Date   AMPUTATION Right 09/15/2019   Procedure: AMPUTATION RIGHT FOOT 2ND DIGIT;  Surgeon: Felipa Furnace, DPM;  Location: [place];  Service: Podiatry;  Laterality: Right;   AMPUTATION Left 02/08/2021   Procedure: AMPUTATION RAY-Partial 5th Ray;  Surgeon: [name], DPM;  Location: [facility];  Service: Podiatry;  Laterality: Left;   BACK SURGERY  1989   COLONOSCOPY  2013   COLONOSCOPY WITH PROPOFOL N/A 10/28/2016   Procedure: COLONOSCOPY WITH PROPOFOL;  Surgeon: Robert Bellow, MD;  Location: [facility];  Service: Endoscopy;  Laterality: N/A;   SHOULDER ARTHROSCOPY WITH SUBACROMIAL DECOMPRESSION Left 04/02/2017   Procedure: SHOULDER ARTHROSCOPY WITH DECOMPRESSION, DEBRIDEMENT, SLAP REPAIR,;  Surgeon: Corky Mull, MD;  Location: [facility];  Service: Orthopedics;  Laterality: Left;   SHOULDER SURGERY Bilateral 2011,2012   Patient Active Problem List   Diagnosis Date Noted   Hypotension    Benign prostatic hyperplasia without lower urinary tract symptoms    Sepsis without acute organ dysfunction ([place])    Diabetic polyneuropathy  associated with type 2 diabetes mellitus ([place])    Pyogenic inflammation of bone ([place]) 02/07/2021   Right foot infection 09/13/2019   HLD (hyperlipidemia) 09/13/2019   AKI (acute kidney injury) ([place]) 09/13/2019   Hyponatremia 09/13/2019   Class 2 obesity with body mass index (BMI) of 37.0 to 37.9 in adult 09/06/2019   Essential hypertension 06/03/2017   Degenerative joint disease of shoulder, left 12/03/2016   Incomplete tear of left rotator cuff 12/03/2016   Rotator cuff tendinitis, left 12/03/2016   Type 2 diabetes mellitus with hyperlipidemia ([place]) 11/03/2016   History of arthroscopy of left shoulder 11/03/2016   History of colonic polyps 10/08/2016     PCP: Albina Billet, MD   REFERRING PROVIDER: Geronimo Boot, PA-C   REFERRING DIAGNOSIS:  M54.41 (ICD-10-CM) - Bilateral low back pain with right-sided sciatica, unspecified chronicity  M54.16 (ICD-10-CM) - Lumbar radiculopathy  Z98.1 (ICD-10-CM) - S/P lumbar spinal fusion      THERAPY DIAG: No diagnosis found.   RATIONALE FOR EVALUATION AND TREATMENT: Rehabilitation   ONSET DATE: 10/03/21   FOLLOW UP APPT WITH PROVIDER: Yes 02/14/22    PERTINENT HISTORY: Patient is a 66 year old male with primary complaint of low back and R buttock pain.  Patient was seen at [facility] on 8/21/123 for LBP and right buttock pain.  Given medrol dose pack and flexeril on 10/29/21. These  did not help. PCP had him on percocet 7.5 as well. This took the edge off.  History of lumbar fusion L3-L5 in 1989.   He has constant LBP with right buttock and right posterior leg pain to his knee x 2-3 months. He has numbness/tingling in right leg. No left leg pain. Right buttock pain is his worst pain. Pain is worse with sitting. He does okay with walking. Can't sleep at night due to pain. Pt reports recently he was moving dog with his foot an had notable pain along R buttock after that. Patient reports that it hurts to sit on hard surfaces. Patient reports  numbness in R anterior thigh. Pt reports years of anterior thigh numbness prior to this incident involving R hip. Patient reports pain along R SI region that runs into buttock down to gluteal fold. Pt reports he also fell onto his R hip after this, which aggravated the area. Hx of Type 2 DM and hx of partial amputations along L and R foot. Pt reports no bowel/bladder changes. Pt reports disturbed sleep, he reports it is better if he rolls to his L side. Pt reports recent weight loss c trying to lose weight.    Pain:  Pain Intensity: Present: 4/10, Best: /10, Worst: 7-8/10 Pain location: R iliolumbar/R SI region down to R buttock Pain Quality: aching , constant Radiating: Yes to buttock/gluteal fold  Numbness/Tingling: Yes, longstanding numbness in R anterior thigh that seems to have worsened  Focal Weakness: weakness in RLE "once or twice"  Aggravating factors: sitting (especially on hard surface), yard work, lying on his R side, prolonged walking Relieving factors: reclined bed frame (adjustable bed frame), inversion/positional traction, pain medicine  24-hour pain behavior: None How long can you walk: to back yard and back to house  History of prior back injury, pain, surgery, or therapy: Yes, L3-5 fusion in 1989 Falls: Has patient fallen in last 6 months? 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  Red flags (bowel/bladder changes, saddle paresthesia, personal history of cancer, h/o spinal tumors, h/o compression fx, h/o abdominal aneurysm, abdominal pain, chills/fever, night sweats, nausea, vomiting, unrelenting pain, first onset of insidious LBP <20 y/o): Negative   Precautions: None   Weight Bearing Restrictions: No   Living Environment Lives with: lives with their family, pt lives with wife; 2 sons live close by   Lives in: House/apartment     Patient Goals: Patient wants more functionality, wants to be able to do more activity       PRECAUTIONS: S/p L3-5 lumbar fusion in 1989   OBJECTIVE: (objective measures completed at initial evaluation unless otherwise dated)  Patient Surveys  FOTO 92, predicted score 58    Gross Musculoskeletal Assessment Tremor: None Bulk: Normal Tone: Normal No visible step-off along spinal column, no signs of scoliosis. Well healed mid-line incision from old lumbar spinal fusion.     GAIT: Forward flexed posture through gait cycle, genu valgum increased during stance phase     Posture: Pt shifts weight to L ileum in sitting, FHRS posture Lumbar lordosis: Decreased Lumbar lateral shift: Negative       AROM       AROM (Normal range in degrees) AROM  01/14/2022  Lumbar    Flexion (65) 75% *pain with return to neutral upright position   Extension (  30) 25%  Right lateral flexion (25) 75%  Left lateral flexion (25) 75%  Right rotation (30) 75%*  Left rotation (30) 75%         Hip Right Left  Flexion (125) WNL    Extension (15)      Abduction (40)      Adduction       Internal Rotation (45) 15    External Rotation (45) 45           Knee      Flexion (135) WNL    Extension (0) WNL           Ankle      Dorsiflexion (20)      Plantarflexion (50)      Inversion (35)      Eversion (15      (* = pain; Blank rows = not tested)     LE MMT:   MMT (out of 5) Right 01/14/2022 Left 01/14/2022  Hip flexion 4+* 4+  Hip extension      Hip abduction 4-*    Hip adduction      Hip internal rotation      Hip external rotation      Knee flexion 5* 5  Knee extension 5* 5  Ankle dorsiflexion 5 5  Ankle plantarflexion      Ankle inversion      Ankle eversion      (* = pain; Blank rows = not tested)     Sensation Grossly intact to light touch bilateral LEs as determined by testing dermatomes L2-S2 with exception of sensation loss in bilateral  feet. Proprioception, and hot/cold testing deferred on this date.     Reflexes R/L Knee Jerk (L3/4): 2+/2+  Ankle Jerk (S1/2): 1+/1+  Clonus: Negative bilaterally Hoffman's sign: Negative bilaterally     Muscle Length Hamstrings: R: Positive, lacking 20 deg extension L: Positive, lacking 20 deg extension [place] (quadriceps): R: Positive L: Positive     Palpation   Location LEFT  RIGHT           Lumbar paraspinals 2 2  Quadratus Lumborum   2  Gluteus Maximus   2  Gluteus Medius   2  Deep hip external rotators      PSIS 1 1  Fortin's Area (SIJ)   2  Greater Trochanter   1 (mild)  (Blank rows = not tested) Graded on 0-4 scale (0 = no pain, 1 = pain, 2 = pain with wincing/grimacing/flinching, 3 = pain with withdrawal, 4 = unwilling to allow palpation), (Blank rows = not tested)     Passive Accessory Intervertebral Motion Deferred at lumbar spine due to hx of L3-5 fusion     SPECIAL TESTS Lumbar Radiculopathy and Discogenic: Slump (SN 83, -LR 0.32): R: Positive L: Negative SLR (SN 92, -LR 0.29): R: Negative L:  Negative   Facet Joint: Extension-Rotation (SN 100, -LR 0.0): R: Positive L: Negative   Lumbar Foraminal Stenosis: Lumbar quadrant (SN 70): R: Positive (mild), L: Negative   Hip: FABER (SN 81): R: Positive L: Positive FADIR (SN 94): R: Not examined L: Not examined Hip scour (SN 50): R: Negative L: Negative   SIJ:  Thigh Thrust (SN 88, -LR 0.18) : R: Negative L: Negative          TODAY'S TREATMENT     SUBJECTIVE: Patient reports being very stiff at arrival. 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 Patient reports ongoing  pain in his R hip this afternoon. Pt feels that pain along gluteal region is about the same. Patient reports most pain with prolonged sitting.   PAIN:  Are you having pain? Yes: NPRS scale: 4/10 Pain location: R buttock   MHP (unbilled) utilized at beginning of session prior to and  during initiation of manual therapy for analgesic effect and improved soft tissue extensibility, along lumbosacral region/R gluteus medius in prone lying, 2 pillows under pelvis; x 5 minutes    Manual Therapy - for symptom modulation, soft tissue sensitivity and mobility, joint mobility, ROM   In prone lying with 2 pillows under pelvis: DTM/TPR R piriformis x 3 minutes STM/DTM and IASTM with Hypervolt  R and L longissimus lumborum, R quadratus lumborum, R gluteus medius and maximus x 15 minutes   Therapeutic Exercise - for improved soft tissue flexibility and extensibility as needed for ROM, graded exposure to thoracolumbar ROM  NuStep, 5 minutes, Level 3; utilized for improved soft tissue extensibility to reduce sensation of stiffness/tightness -2 minutes unbilled, subjective information gathered during this time   Lower trunk rotations, quadratus lumborum bias (figure-4 position); x10, 3 sec hold   Glute bridge; 2x8  Sidelying hip abduction; 2x8  -tactile and verbal cueing to avoid TFL/hip flexion compensation  Sit to stand; with goblet hold c medicine ball; 6-lb Medball; 2x10   -verbal cueing and demo for posture Standing hip 3-way Red Tband; x8 ea dir, performed on each LE; light touch on edge of treatment table for balance  -verbal cueing and demonstration to limit hip hike and contralateral thoracolumbar sidebend versus hip abduction     *not today* Piriformis stretch, figure-4 pull and push; 2x30 sec each, in hooklying   Sidelying clamshelll; 2x10, Red Tband; tactile and verbal cueing to limit trunk rotation compensation Lower trunk rotations; x10 ea dir, hooklying Physioball rollout, blue ball; x5 ea dir, 5 sec hold  Hip abduction isometric; x10, 10 sec Open book; 1x10 on each side      PATIENT EDUCATION:  Education details: see above for patient education details   Person educated: Patient Education method: Explanation, Demonstration, and Handouts Education  comprehension: verbalized understanding and returned demonstration     HOME EXERCISE PROGRAM: Access Code: [email or web address] URL: [email or web address] Date: 01/14/2022 Prepared by: Valentina Gu   Exercises - Supine Lower Trunk Rotation  - 2 x daily - 7 x weekly - 2 sets - 10 reps - Supine Piriformis Stretch with Foot on Ground  - 2 x daily - 7 x weekly - 3 sets - 30sec hold     ASSESSMENT:   CLINICAL IMPRESSION: Patient has reported lesser concern with R gluteal pain with episodic increases in axial low back pain, but he feels today that his R gluteal pain is about the same and feels that his axial low back pain more so diverted his attention from R gluteal pain. Pt has remaining sensitivity along R gluteas medius and deep hip external rotators as well as R lower lumbar paraspinal region. Pt is able to further progress with hip strengthening today without notable c/o pain, though pt may have difficulty managing pain given persistent pain status following lumbar fusion in 1989. Pt does participate well with PT and has been compliant with home exercises, but he does report no change today in primary c/o R buttock pain. Patient will benefit from continued skilled PT to address above impairments and improve overall function.   REHAB POTENTIAL: Good   CLINICAL DECISION MAKING: Evolving/moderate complexity  EVALUATION COMPLEXITY: Moderate     GOALS:   SHORT TERM GOALS: Target date: 01/30/2022   Pt will be independent with HEP to improve strength and decrease back pain to improve pain-free function at home and work. Baseline: 01/09/22: Baseline HEP initiated Goal status: INITIAL     LONG TERM GOALS: Target date: 02/20/2022   Pt will increase FOTO to at least 58 to demonstrate significant improvement in function at home and work related to back pain  Baseline: 01/09/22: 44 Goal status: INITIAL   2.  Pt will decrease worst back pain by at least 2 points on the NPRS in order  to demonstrate clinically significant reduction in back pain. Baseline: 01/09/22: Pain 7-8/10 at worst Goal status: INITIAL   3.  Pt will access 75% or greater thoracolumbar AROM without reproduction of symptoms as needed for reaching to floor and self-care activities e.g. bathing/dressing Baseline: 01/09/22: Mild to severe motion loss with all directions, pain with R rotation and return to neutral from lumbar flexion Goal status: INITIAL   4.  Pt will ambulate 150 feet or greater without significant gait deviation/antalgic pattern and symmetrical weightbearing on each LE without reproduction of pain > 1-2/10 as needed for household mobility and short-distance community-level gait Baseline: 01/09/22: Significant pain with walking from his house to backyard Goal status: INITIAL   5.  Pt will sleep through the night without disturbance secondary to back or hip/thigh pain as needed for long-term sleep quality needed for lifelong health Baseline: 01/09/22: Poor sleep quality, pt states he can't sleep due to pain.  Goal status: INITIAL     PLAN: PT FREQUENCY: 2x/week   PT DURATION: 6 weeks   PLANNED INTERVENTIONS: Therapeutic exercises, Therapeutic activity, Neuromuscular re-education, Patient/Family education, Dry Needling, Electrical stimulation, Cryotherapy, Moist heat, Traction, and Manual therapy   PLAN FOR NEXT SESSION: Manual therapy, STM, modalities as needed for pain control. F/u on response with dry needling; utilize at future sessions as needed. Quad/hamstrings flexibility. Graded movement and progressive loading to develop resilience of affected tissues as tolerated.    Valentina Gu, PT, DPT #[id number]  Eilleen Kempf, PT 02/03/2022, 10:04 AM

## 2022-02-02 ENCOUNTER — Encounter: Payer: Self-pay | Admitting: Physical Therapy

## 2022-02-04 ENCOUNTER — Ambulatory Visit: Payer: Medicare HMO | Admitting: Physical Therapy

## 2022-02-04 ENCOUNTER — Encounter: Payer: Self-pay | Admitting: Physical Therapy

## 2022-02-04 DIAGNOSIS — R262 Difficulty in walking, not elsewhere classified: Secondary | ICD-10-CM

## 2022-02-04 DIAGNOSIS — M5459 Other low back pain: Secondary | ICD-10-CM | POA: Diagnosis not present

## 2022-02-04 DIAGNOSIS — M6281 Muscle weakness (generalized): Secondary | ICD-10-CM

## 2022-02-04 DIAGNOSIS — M25551 Pain in right hip: Secondary | ICD-10-CM

## 2022-02-04 NOTE — Therapy (Signed)
OUTPATIENT PHYSICAL THERAPY TREATMENT NOTE   Patient Name: Christopher Escobar MRN: [id number] DOB:May 08, 1955, 67 y.o., male Today's Date: 02/04/2022   END OF SESSION:   PT End of Session - 02/04/22 1302     Visit Number 7    Number of Visits 13    Date for PT Re-Evaluation 02/20/22    Authorization Type [employer] 2023    Authorization Time Period initial eval 01/09/22    Progress Note Due on Visit 10    PT Start Time 1300    PT Stop Time 1343    PT Time Calculation (min) 43 min    Activity Tolerance Patient limited by pain    Behavior During Therapy [facility] for tasks assessed/performed               Past Medical History:  Diagnosis Date   Diabetes mellitus without complication ([place]) 4431   Hypertension 2008   Personal history of colonic polyps    TA polyp   Sleep apnea    not using cpap   Past Surgical History:  Procedure Laterality Date   AMPUTATION Right 09/15/2019   Procedure: AMPUTATION RIGHT FOOT 2ND DIGIT;  Surgeon: Felipa Furnace, DPM;  Location: [facility];  Service: Podiatry;  Laterality: Right;   AMPUTATION Left 02/08/2021   Procedure: AMPUTATION RAY-Partial 5th Ray;  Surgeon: [name], DPM;  Location: [facility];  Service: Podiatry;  Laterality: Left;   BACK SURGERY  1989   COLONOSCOPY  2013   COLONOSCOPY WITH PROPOFOL N/A 10/28/2016   Procedure: COLONOSCOPY WITH PROPOFOL;  Surgeon: Robert Bellow, MD;  Location: [facility];  Service: Endoscopy;  Laterality: N/A;   SHOULDER ARTHROSCOPY WITH SUBACROMIAL DECOMPRESSION Left 04/02/2017   Procedure: SHOULDER ARTHROSCOPY WITH DECOMPRESSION, DEBRIDEMENT, SLAP REPAIR,;  Surgeon: Corky Mull, MD;  Location: [facility];  Service: Orthopedics;  Laterality: Left;   SHOULDER SURGERY Bilateral 2011,2012   Patient Active Problem List   Diagnosis Date Noted   Hypotension    Benign prostatic hyperplasia without lower urinary tract symptoms    Sepsis without acute organ dysfunction ([place])    Diabetic polyneuropathy  associated with type 2 diabetes mellitus ([place])    Pyogenic inflammation of bone ([place]) 02/07/2021   Right foot infection 09/13/2019   HLD (hyperlipidemia) 09/13/2019   AKI (acute kidney injury) ([place]) 09/13/2019   Hyponatremia 09/13/2019   Class 2 obesity with body mass index (BMI) of 37.0 to 37.9 in adult 09/06/2019   Essential hypertension 06/03/2017   Degenerative joint disease of shoulder, left 12/03/2016   Incomplete tear of left rotator cuff 12/03/2016   Rotator cuff tendinitis, left 12/03/2016   Type 2 diabetes mellitus with hyperlipidemia ([place]) 11/03/2016   History of arthroscopy of left shoulder 11/03/2016   History of colonic polyps 10/08/2016     PCP: Albina Billet, MD   REFERRING PROVIDER: Geronimo Boot, PA-C   REFERRING DIAGNOSIS:  M54.41 (ICD-10-CM) - Bilateral low back pain with right-sided sciatica, unspecified chronicity  M54.16 (ICD-10-CM) - Lumbar radiculopathy  Z98.1 (ICD-10-CM) - S/P lumbar spinal fusion      THERAPY DIAG: No diagnosis found.   RATIONALE FOR EVALUATION AND TREATMENT: Rehabilitation   ONSET DATE: 10/03/21   FOLLOW UP APPT WITH PROVIDER: Yes 02/14/22    PERTINENT HISTORY: Patient is a 66 year old male with primary complaint of low back and R buttock pain.  Patient was seen at [facility] on 8/21/123 for LBP and right buttock pain.  Given medrol dose pack and flexeril on 10/29/21.  These did not help. PCP had him on percocet 7.5 as well. This took the edge off.  History of lumbar fusion L3-L5 in 1989.   He has constant LBP with right buttock and right posterior leg pain to his knee x 2-3 months. He has numbness/tingling in right leg. No left leg pain. Right buttock pain is his worst pain. Pain is worse with sitting. He does okay with walking. Can't sleep at night due to pain. Pt reports recently he was moving dog with his foot an had notable pain along R buttock after that. Patient reports that it hurts to sit on hard surfaces. Patient reports  numbness in R anterior thigh. Pt reports years of anterior thigh numbness prior to this incident involving R hip. Patient reports pain along R SI region that runs into buttock down to gluteal fold. Pt reports he also fell onto his R hip after this, which aggravated the area. Hx of Type 2 DM and hx of partial amputations along L and R foot. Pt reports no bowel/bladder changes. Pt reports disturbed sleep, he reports it is better if he rolls to his L side. Pt reports recent weight loss c trying to lose weight.    Pain:  Pain Intensity: Present: 4/10, Best: 3-4/10, Worst: 7-8/10 Pain location: R iliolumbar/R SI region down to R buttock Pain Quality: aching , constant Radiating: Yes to buttock/gluteal fold  Numbness/Tingling: Yes, longstanding numbness in R anterior thigh that seems to have worsened  Focal Weakness: weakness in RLE "once or twice"  Aggravating factors: sitting (especially on hard surface), yard work, lying on his R side, prolonged walking Relieving factors: reclined bed frame (adjustable bed frame), inversion/positional traction, pain medicine  24-hour pain behavior: None How long can you walk: to back yard and back to house  History of prior back injury, pain, surgery, or therapy: Yes, L3-5 fusion in 1989 Falls: Has patient fallen in last 6 months? 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  Red flags (bowel/bladder changes, saddle paresthesia, personal history of cancer, h/o spinal tumors, h/o compression fx, h/o abdominal aneurysm, abdominal pain, chills/fever, night sweats, nausea, vomiting, unrelenting pain, first onset of insidious LBP <18 y/o): Negative   Precautions: None   Weight Bearing Restrictions: No   Living Environment Lives with: lives with their family, pt lives with wife; 2 sons live close by   Lives in: House/apartment     Patient Goals: Patient wants more functionality, wants to be able to do more activity       PRECAUTIONS: S/p L3-5 lumbar fusion in 1989   OBJECTIVE: (objective measures completed at initial evaluation unless otherwise dated)  Patient Surveys  FOTO 1, predicted score 58    Gross Musculoskeletal Assessment Tremor: None Bulk: Normal Tone: Normal No visible step-off along spinal column, no signs of scoliosis. Well healed mid-line incision from old lumbar spinal fusion.     GAIT: Forward flexed posture through gait cycle, genu valgum increased during stance phase     Posture: Pt shifts weight to L ileum in sitting, FHRS posture Lumbar lordosis: Decreased Lumbar lateral shift: Negative       AROM       AROM (Normal range in degrees) AROM  01/14/2022  Lumbar    Flexion (65) 75% *pain with return to neutral upright position  Extension (30) 25%  Right lateral flexion (25) 75%  Left lateral flexion (25) 75%  Right rotation (30) 75%*  Left rotation (30) 75%         Hip Right Left  Flexion (125) WNL    Extension (15)      Abduction (40)      Adduction       Internal Rotation (45) 15    External Rotation (45) 45           Knee      Flexion (135) WNL    Extension (0) WNL           Ankle      Dorsiflexion (20)      Plantarflexion (50)      Inversion (35)      Eversion (15      (* = pain; Blank rows = not tested)     LE MMT:   MMT (out of 5) Right 01/14/2022 Left 01/14/2022  Hip flexion 4+* 4+  Hip extension      Hip abduction 4-*    Hip adduction      Hip internal rotation      Hip external rotation      Knee flexion 5* 5  Knee extension 5* 5  Ankle dorsiflexion 5 5  Ankle plantarflexion      Ankle inversion      Ankle eversion      (* = pain; Blank rows = not tested)     Sensation Grossly intact to light touch bilateral LEs as determined by testing dermatomes L2-S2 with exception of sensation loss in bilateral  feet. Proprioception, and hot/cold testing deferred on this date.     Reflexes R/L Knee Jerk (L3/4): 2+/2+  Ankle Jerk (S1/2): 1+/1+  Clonus: Negative bilaterally Hoffman's sign: Negative bilaterally     Muscle Length Hamstrings: R: Positive, lacking 20 deg extension L: Positive, lacking 20 deg extension [place] (quadriceps): R: Positive L: Positive     Palpation   Location LEFT  RIGHT           Lumbar paraspinals 2 2  Quadratus Lumborum   2  Gluteus Maximus   2  Gluteus Medius   2  Deep hip external rotators      PSIS 1 1  Fortin's Area (SIJ)   2  Greater Trochanter   1 (mild)  (Blank rows = not tested) Graded on 0-4 scale (0 = no pain, 1 = pain, 2 = pain with wincing/grimacing/flinching, 3 = pain with withdrawal, 4 = unwilling to allow palpation), (Blank rows = not tested)     Passive Accessory Intervertebral Motion Deferred at lumbar spine due to hx of L3-5 fusion     SPECIAL TESTS Lumbar Radiculopathy and Discogenic: Slump (SN 83, -LR 0.32): R: Positive L: Negative SLR (SN 92, -LR 0.29): R: Negative L:  Negative   Facet Joint: Extension-Rotation (SN 100, -LR 0.0): R: Positive L: Negative   Lumbar Foraminal Stenosis: Lumbar quadrant (SN 70): R: Positive (mild), L: Negative   Hip: FABER (SN 81): R: Positive L: Positive FADIR (SN 94): R: Not examined L: Not examined Hip scour (SN 50): R: Negative L: Negative   SIJ:  Thigh Thrust (SN 88, -LR 0.18) : R: Negative L: Negative          TODAY'S TREATMENT     SUBJECTIVE: 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  Patient reports doing well with his home exercises. Patient reports 3-4/10 pain affecting gluteal region at arrival to physical therapy. Patient denies significant soreness after his last visit. Pt reports being limited in prolonged sitting >  1.5 hr. He reports having to lie on his L side at the time when  lying in bed.   PAIN:  Are you having pain? Yes: NPRS scale: 3-4/10 Pain location: R buttock   MHP (unbilled) utilized at beginning of session prior to and during initiation of manual therapy for analgesic effect and improved soft tissue extensibility, along lumbosacral region/R gluteus medius in prone lying, 2 pillows under pelvis; x 5 minutes    Manual Therapy - for symptom modulation, soft tissue sensitivity and mobility, joint mobility, ROM   In prone lying with 2 pillows under pelvis: R piriformis/gemelli ischemic compression with "passive pump" technique; x 2 minutes  STM/DTM and IASTM with Hypervolt  R and L longissimus lumborum, R quadratus lumborum, R gluteus medius and maximus x 10 minutes   Therapeutic Exercise - for improved soft tissue flexibility and extensibility as needed for ROM, graded exposure to thoracolumbar ROM  NuStep, 5 minutes, Level 3; utilized for improved soft tissue extensibility to reduce sensation of stiffness/tightness -2 minutes unbilled, subjective information gathered during this time   Piriformis stretch, figure-4 pull and push; 2x30 sec each, in hooklying  Lower trunk rotations, quadratus lumborum bias (figure-4 position); x10, 3 sec hold   Glute bridge; 2x10, with Blue Tband tied above knees  -verbal and tactile cueing to maintain taut band with hips abducted to neutral position  Sidelying hip abduction; 2x10  -tactile and verbal cueing to avoid TFL/hip flexion compensation  Sit to stand; with goblet hold c medicine ball; 10-lb Medball; 2x10   -verbal cueing and demo for posture     *not today* Standing hip 3-way Red Tband; x8 ea dir, performed on each LE; light touch on edge of treatment table for balance  -verbal cueing and demonstration to limit hip hike and contralateral thoracolumbar sidebend versus hip abduction  Sidelying clamshelll; 2x10, Red Tband; tactile and verbal cueing to limit trunk rotation compensation Lower trunk  rotations; x10 ea dir, hooklying Physioball rollout, blue ball; x5 ea dir, 5 sec hold  Hip abduction isometric; x10, 10 sec Open book; 1x10 on each side      PATIENT EDUCATION:  Education details: see above for patient education details   Person educated: Patient Education method: Explanation, Demonstration, and Handouts Education comprehension: verbalized understanding and returned demonstration     HOME EXERCISE PROGRAM: Access Code: [email or web address] URL: [email or web address] Date: 01/14/2022 Prepared by: Valentina Gu   Exercises - Supine Lower Trunk Rotation  - 2 x daily - 7 x weekly - 2 sets - 10 reps - Supine Piriformis Stretch with Foot on Ground  - 2 x daily - 7 x weekly - 3 sets - 30sec hold     ASSESSMENT:   CLINICAL IMPRESSION: Patient denies notable flare-up of symptoms with 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 However, he has elected to hold on performing certain tasks associated with this project e.g. installing shingles on roof. Pt does have ongoing difficulties with comfort with lying at night and states he has to lie on his L side at this time. Pt has been able to progress with gluteal isotonics without notable increase in pain. PT has focused on strengthening and building resiliency along affected tissues versus emphasis on manual therapy/modalities. Pt did not have notable improvement with DN, and manual  techniques have been limited in benefiting pt for pain modulation. Patient will benefit from continued skilled PT to address above impairments and improve overall function.   REHAB POTENTIAL: Good   CLINICAL DECISION MAKING: Evolving/moderate complexity   EVALUATION COMPLEXITY: Moderate     GOALS:   SHORT TERM GOALS: Target date: 01/30/2022   Pt will be independent with HEP to improve strength and decrease back pain to improve pain-free function at home and work. Baseline: 01/09/22: Baseline HEP initiated Goal status: INITIAL     LONG  TERM GOALS: Target date: 02/20/2022   Pt will increase FOTO to at least 58 to demonstrate significant improvement in function at home and work related to back pain  Baseline: 01/09/22: 44 Goal status: INITIAL   2.  Pt will decrease worst back pain by at least 2 points on the NPRS in order to demonstrate clinically significant reduction in back pain. Baseline: 01/09/22: Pain 7-8/10 at worst Goal status: INITIAL   3.  Pt will access 75% or greater thoracolumbar AROM without reproduction of symptoms as needed for reaching to floor and self-care activities e.g. bathing/dressing Baseline: 01/09/22: Mild to severe motion loss with all directions, pain with R rotation and return to neutral from lumbar flexion Goal status: INITIAL   4.  Pt will ambulate 150 feet or greater without significant gait deviation/antalgic pattern and symmetrical weightbearing on each LE without reproduction of pain > 1-2/10 as needed for household mobility and short-distance community-level gait Baseline: 01/09/22: Significant pain with walking from his house to backyard Goal status: INITIAL   5.  Pt will sleep through the night without disturbance secondary to back or hip/thigh pain as needed for long-term sleep quality needed for lifelong health Baseline: 01/09/22: Poor sleep quality, pt states he can't sleep due to pain.  Goal status: INITIAL     PLAN: PT FREQUENCY: 2x/week   PT DURATION: 6 weeks   PLANNED INTERVENTIONS: Therapeutic exercises, Therapeutic activity, Neuromuscular re-education, Patient/Family education, Dry Needling, Electrical stimulation, Cryotherapy, Moist heat, Traction, and Manual therapy   PLAN FOR NEXT SESSION: Manual therapy, STM, modalities as needed for pain control. F/u on response with dry needling; utilize at future sessions as needed. Quad/hamstrings flexibility. Graded movement and progressive loading to develop resilience of affected tissues as tolerated.    Valentina Gu, PT,  DPT #[id number]  Eilleen Kempf, PT 02/04/2022, 1:02 PM

## 2022-02-06 ENCOUNTER — Encounter: Payer: Self-pay | Admitting: Physical Therapy

## 2022-02-06 ENCOUNTER — Ambulatory Visit: Payer: Medicare HMO | Admitting: Physical Therapy

## 2022-02-06 DIAGNOSIS — R262 Difficulty in walking, not elsewhere classified: Secondary | ICD-10-CM

## 2022-02-06 DIAGNOSIS — M25551 Pain in right hip: Secondary | ICD-10-CM

## 2022-02-06 DIAGNOSIS — M6281 Muscle weakness (generalized): Secondary | ICD-10-CM

## 2022-02-06 DIAGNOSIS — M5459 Other low back pain: Secondary | ICD-10-CM

## 2022-02-06 NOTE — Therapy (Signed)
OUTPATIENT PHYSICAL THERAPY TREATMENT NOTE   Patient Name: Christopher Escobar MRN: [id number] DOB:10/26/1955, 66 y.o., male Today's Date: 02/10/2022   END OF SESSION:   PT End of Session - 02/10/22 0940     Visit Number 8    Number of Visits 13    Date for PT Re-Evaluation 02/20/22    Authorization Type [employer] 2023    Authorization Time Period initial eval 01/09/22    Progress Note Due on Visit 10    PT Start Time 1300    PT Stop Time 1350    PT Time Calculation (min) 50 min    Activity Tolerance Patient limited by pain    Behavior During Therapy [facility] for tasks assessed/performed                 Past Medical History:  Diagnosis Date   Diabetes mellitus without complication ([place]) 5643   Hypertension 2008   Personal history of colonic polyps    TA polyp   Sleep apnea    not using cpap   Past Surgical History:  Procedure Laterality Date   AMPUTATION Right 09/15/2019   Procedure: AMPUTATION RIGHT FOOT 2ND DIGIT;  Surgeon: Felipa Furnace, DPM;  Location: [place];  Service: Podiatry;  Laterality: Right;   AMPUTATION Left 02/08/2021   Procedure: AMPUTATION RAY-Partial 5th Ray;  Surgeon: [name], DPM;  Location: [facility];  Service: Podiatry;  Laterality: Left;   BACK SURGERY  1989   COLONOSCOPY  2013   COLONOSCOPY WITH PROPOFOL N/A 10/28/2016   Procedure: COLONOSCOPY WITH PROPOFOL;  Surgeon: Robert Bellow, MD;  Location: [facility];  Service: Endoscopy;  Laterality: N/A;   SHOULDER ARTHROSCOPY WITH SUBACROMIAL DECOMPRESSION Left 04/02/2017   Procedure: SHOULDER ARTHROSCOPY WITH DECOMPRESSION, DEBRIDEMENT, SLAP REPAIR,;  Surgeon: Corky Mull, MD;  Location: [facility];  Service: Orthopedics;  Laterality: Left;   SHOULDER SURGERY Bilateral 2011,2012   Patient Active Problem List   Diagnosis Date Noted   Hypotension    Benign prostatic hyperplasia without lower urinary tract symptoms    Sepsis without acute organ dysfunction ([place])    Diabetic polyneuropathy  associated with type 2 diabetes mellitus ([place])    Pyogenic inflammation of bone ([place]) 02/07/2021   Right foot infection 09/13/2019   HLD (hyperlipidemia) 09/13/2019   AKI (acute kidney injury) ([place]) 09/13/2019   Hyponatremia 09/13/2019   Class 2 obesity with body mass index (BMI) of 37.0 to 37.9 in adult 09/06/2019   Essential hypertension 06/03/2017   Degenerative joint disease of shoulder, left 12/03/2016   Incomplete tear of left rotator cuff 12/03/2016   Rotator cuff tendinitis, left 12/03/2016   Type 2 diabetes mellitus with hyperlipidemia ([place]) 11/03/2016   History of arthroscopy of left shoulder 11/03/2016   History of colonic polyps 10/08/2016     PCP: Albina Billet, MD   REFERRING PROVIDER: Geronimo Boot, PA-C   REFERRING DIAGNOSIS:  M54.41 (ICD-10-CM) - Bilateral low back pain with right-sided sciatica, unspecified chronicity  M54.16 (ICD-10-CM) - Lumbar radiculopathy  Z98.1 (ICD-10-CM) - S/P lumbar spinal fusion      THERAPY DIAG: No diagnosis found.   RATIONALE FOR EVALUATION AND TREATMENT: Rehabilitation   ONSET DATE: 10/03/21   FOLLOW UP APPT WITH PROVIDER: Yes 02/14/22    PERTINENT HISTORY: Patient is a 66 year old male with primary complaint of low back and R buttock pain.  Patient was seen at [facility] on 8/21/123 for LBP and right buttock pain.  Given medrol dose pack and flexeril  on 10/29/21. These did not help. PCP had him on percocet 7.5 as well. This took the edge off.  History of lumbar fusion L3-L5 in 1989.   He has constant LBP with right buttock and right posterior leg pain to his knee x 2-3 months. He has numbness/tingling in right leg. No left leg pain. Right buttock pain is his worst pain. Pain is worse with sitting. He does okay with walking. Can't sleep at night due to pain. Pt reports recently he was moving dog with his foot an had notable pain along R buttock after that. Patient reports that it hurts to sit on hard surfaces. Patient reports  numbness in R anterior thigh. Pt reports years of anterior thigh numbness prior to this incident involving R hip. Patient reports pain along R SI region that runs into buttock down to gluteal fold. Pt reports he also fell onto his R hip after this, which aggravated the area. Hx of Type 2 DM and hx of partial amputations along L and R foot. Pt reports no bowel/bladder changes. Pt reports disturbed sleep, he reports it is better if he rolls to his L side. Pt reports recent weight loss c trying to lose weight.    Pain:  Pain Intensity: Present: 4/10, Best: 3-4/10, Worst: 7-8/10 Pain location: R iliolumbar/R SI region down to R buttock Pain Quality: aching , constant Radiating: Yes to buttock/gluteal fold  Numbness/Tingling: Yes, longstanding numbness in R anterior thigh that seems to have worsened  Focal Weakness: weakness in RLE "once or twice"  Aggravating factors: sitting (especially on hard surface), yard work, lying on his R side, prolonged walking Relieving factors: reclined bed frame (adjustable bed frame), inversion/positional traction, pain medicine  24-hour pain behavior: None How long can you walk: to back yard and back to house  History of prior back injury, pain, surgery, or therapy: Yes, L3-5 fusion in 1989 Falls: Has patient fallen in last 6 months? 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  Red flags (bowel/bladder changes, saddle paresthesia, personal history of cancer, h/o spinal tumors, h/o compression fx, h/o abdominal aneurysm, abdominal pain, chills/fever, night sweats, nausea, vomiting, unrelenting pain, first onset of insidious LBP <20 y/o): Negative   Precautions: None   Weight Bearing Restrictions: No   Living Environment Lives with: lives with their family, pt lives with wife; 2 sons live close by   Lives in: House/apartment     Patient Goals: Patient wants more functionality, wants to be able to do more activity       PRECAUTIONS: S/p L3-5 lumbar fusion in 1989   OBJECTIVE: (objective measures completed at initial evaluation unless otherwise dated)  Patient Surveys  FOTO 64, predicted score 58    Gross Musculoskeletal Assessment Tremor: None Bulk: Normal Tone: Normal No visible step-off along spinal column, no signs of scoliosis. Well healed mid-line incision from old lumbar spinal fusion.     GAIT: Forward flexed posture through gait cycle, genu valgum increased during stance phase     Posture: Pt shifts weight to L ileum in sitting, FHRS posture Lumbar lordosis: Decreased Lumbar lateral shift: Negative       AROM       AROM (Normal range in degrees) AROM  01/14/2022  Lumbar    Flexion (65) 75% *pain with return to neutral upright position  Extension (30) 25%  Right lateral flexion (25) 75%  Left lateral flexion (25) 75%  Right rotation (30) 75%*  Left rotation (30) 75%         Hip Right Left  Flexion (125) WNL    Extension (15)      Abduction (40)      Adduction       Internal Rotation (45) 15    External Rotation (45) 45           Knee      Flexion (135) WNL    Extension (0) WNL           Ankle      Dorsiflexion (20)      Plantarflexion (50)      Inversion (35)      Eversion (15      (* = pain; Blank rows = not tested)     LE MMT:   MMT (out of 5) Right 01/14/2022 Left 01/14/2022  Hip flexion 4+* 4+  Hip extension      Hip abduction 4-*    Hip adduction      Hip internal rotation      Hip external rotation      Knee flexion 5* 5  Knee extension 5* 5  Ankle dorsiflexion 5 5  Ankle plantarflexion      Ankle inversion      Ankle eversion      (* = pain; Blank rows = not tested)     Sensation Grossly intact to light touch bilateral LEs as determined by testing dermatomes L2-S2 with exception of sensation loss in bilateral  feet. Proprioception, and hot/cold testing deferred on this date.     Reflexes R/L Knee Jerk (L3/4): 2+/2+  Ankle Jerk (S1/2): 1+/1+  Clonus: Negative bilaterally Hoffman's sign: Negative bilaterally     Muscle Length Hamstrings: R: Positive, lacking 20 deg extension L: Positive, lacking 20 deg extension [place] (quadriceps): R: Positive L: Positive     Palpation   Location LEFT  RIGHT           Lumbar paraspinals 2 2  Quadratus Lumborum   2  Gluteus Maximus   2  Gluteus Medius   2  Deep hip external rotators      PSIS 1 1  Fortin's Area (SIJ)   2  Greater Trochanter   1 (mild)  (Blank rows = not tested) Graded on 0-4 scale (0 = no pain, 1 = pain, 2 = pain with wincing/grimacing/flinching, 3 = pain with withdrawal, 4 = unwilling to allow palpation), (Blank rows = not tested)     Passive Accessory Intervertebral Motion Deferred at lumbar spine due to hx of L3-5 fusion     SPECIAL TESTS Lumbar Radiculopathy and Discogenic: Slump (SN 83, -LR 0.32): R: Positive L: Negative SLR (SN 92, -LR 0.29): R: Negative L:  Negative   Facet Joint: Extension-Rotation (SN 100, -LR 0.0): R: Positive L: Negative   Lumbar Foraminal Stenosis: Lumbar quadrant (SN 70): R: Positive (mild), L: Negative   Hip: FABER (SN 81): R: Positive L: Positive FADIR (SN 94): R: Not examined L: Not examined Hip scour (SN 50): R: Negative L: Negative   SIJ:  Thigh Thrust (SN 88, -LR 0.18) : R: Negative L: Negative          TODAY'S TREATMENT     SUBJECTIVE: Patient reports soreness in quads and shoulders today following yard work yesterday - blowing and raking leaves. Patient reports 3/10 pain along R  gluteal region/R flank at arrival to PT. Pt reports he has not aggravated his buttock region significantly today, since he has been sitting much. He reports moderate soreness after completing work yesterday.   PAIN:  Are you having pain? Yes: NPRS scale: 3/10 Pain location: R buttock    Manual  Therapy - for symptom modulation, soft tissue sensitivity and mobility, joint mobility, ROM   In supine: R hip manual lateral distraction with pt in hooklying; with Mulligan belt with 10 sec intermittent holds; x 3 minutes R hip mobilization with movement with Mulligan belt, c conjunct hip flexion and ER/IR; x 5 minutes   In prone lying with 2 pillows under pelvis: STM/DTM and IASTM with Hypervolt  R and L longissimus lumborum, R quadratus lumborum, R gluteus medius and maximus x 8 minutes   *not today* R piriformis/gemelli ischemic compression with "passive pump" technique; x 2 minutes    Therapeutic Exercise - for improved soft tissue flexibility and extensibility as needed for ROM, graded exposure to thoracolumbar ROM  NuStep, 5 minutes, Level 3; utilized for improved soft tissue extensibility to reduce sensation of stiffness/tightness -2 minutes unbilled, subjective information gathered during this time   Piriformis stretch, figure-4 pull and push; 2x30 sec each, in hooklying  Lower trunk rotations, quadratus lumborum bias (figure-4 position); x10, 3 sec hold   Glute bridge; 2x10, with Blue Tband tied above knees  -verbal and tactile cueing to maintain taut band with hips abducted to neutral position  Sidelying hip abduction; 2x10  -tactile and verbal cueing to avoid TFL/hip flexion compensation  Sit to stand; with goblet hold c medicine ball; 10-lb Medball; 2x10   -verbal cueing and demo for posture     *not today* Standing hip 3-way Red Tband; x8 ea dir, performed on each LE; light touch on edge of treatment table for balance  -verbal cueing and demonstration to limit hip hike and contralateral thoracolumbar sidebend versus hip abduction  Sidelying clamshelll; 2x10, Red Tband; tactile and verbal cueing to limit trunk rotation compensation Lower trunk rotations; x10 ea dir, hooklying Physioball rollout, blue ball; x5 ea dir, 5 sec hold  Hip abduction isometric; x10, 10  sec Open book; 1x10 on each side     Cold pack (unbilled) - for anti-inflammatory and analgesic effect as needed for reduced pain and improved ability to participate in active PT intervention, along R iliolumbar/gluteal region in prone lying with 2 pillows under pelvis, x 5 minutes     PATIENT EDUCATION:  Education details: see above for patient education details   Person educated: Patient Education method: Explanation, Demonstration, and Handouts Education comprehension: verbalized understanding and returned demonstration     HOME EXERCISE PROGRAM: Access Code: [email or web address] URL: [email or web address] Date: 01/14/2022 Prepared by: Valentina Gu   Exercises - Supine Lower Trunk Rotation  - 2 x daily - 7 x weekly - 2 sets - 10 reps - Supine Piriformis Stretch with Foot on Ground  - 2 x daily - 7 x weekly - 3 sets - 30sec hold     ASSESSMENT:   CLINICAL IMPRESSION: Patient has improved tolerance of hip flexion, external rotation, and internal rotation with conjunct use of Mulligan mobilization with movement. He does have similar NPRS for R gluteal/lumbar flank pain at this time with primary concern being pain along R buttock. Pt has longstanding chronic LBP following fusion in 1989 and progress with axial low back pain may be slower. Pt has been able to progress with gluteal strengthening without notable c/o pain.  Will follow-up next visit on benefit from use of Mulligan techniques focusing on coxofemoral joint versus purely focusing on soft tissue along R gluteal region and R lumbar paraspinal musculature. Patient will benefit from continued skilled PT to address above impairments and improve overall function.   REHAB POTENTIAL: Good   CLINICAL DECISION MAKING: Evolving/moderate complexity   EVALUATION COMPLEXITY: Moderate     GOALS:   SHORT TERM GOALS: Target date: 01/30/2022   Pt will be independent with HEP to improve strength and decrease back pain to improve  pain-free function at home and work. Baseline: 01/09/22: Baseline HEP initiated Goal status: INITIAL     LONG TERM GOALS: Target date: 02/20/2022   Pt will increase FOTO to at least 58 to demonstrate significant improvement in function at home and work related to back pain  Baseline: 01/09/22: 44 Goal status: INITIAL   2.  Pt will decrease worst back pain by at least 2 points on the NPRS in order to demonstrate clinically significant reduction in back pain. Baseline: 01/09/22: Pain 7-8/10 at worst Goal status: INITIAL   3.  Pt will access 75% or greater thoracolumbar AROM without reproduction of symptoms as needed for reaching to floor and self-care activities e.g. bathing/dressing Baseline: 01/09/22: Mild to severe motion loss with all directions, pain with R rotation and return to neutral from lumbar flexion Goal status: INITIAL   4.  Pt will ambulate 150 feet or greater without significant gait deviation/antalgic pattern and symmetrical weightbearing on each LE without reproduction of pain > 1-2/10 as needed for household mobility and short-distance community-level gait Baseline: 01/09/22: Significant pain with walking from his house to backyard Goal status: INITIAL   5.  Pt will sleep through the night without disturbance secondary to back or hip/thigh pain as needed for long-term sleep quality needed for lifelong health Baseline: 01/09/22: Poor sleep quality, pt states he can't sleep due to pain.  Goal status: INITIAL     PLAN: PT FREQUENCY: 2x/week   PT DURATION: 6 weeks   PLANNED INTERVENTIONS: Therapeutic exercises, Therapeutic activity, Neuromuscular re-education, Patient/Family education, Dry Needling, Electrical stimulation, Cryotherapy, Moist heat, Traction, and Manual therapy   PLAN FOR NEXT SESSION: Manual therapy, STM, modalities as needed for pain control. Quad/hamstrings flexibility. Graded movement and progressive loading to develop resilience of affected tissues  as tolerated.    Valentina Gu, PT, DPT #[id number]  Eilleen Kempf, PT 02/10/2022, 9:40 AM

## 2022-02-11 ENCOUNTER — Encounter: Payer: Self-pay | Admitting: Physical Therapy

## 2022-02-11 ENCOUNTER — Ambulatory Visit: Payer: Medicare HMO | Admitting: Physical Therapy

## 2022-02-11 DIAGNOSIS — M6281 Muscle weakness (generalized): Secondary | ICD-10-CM

## 2022-02-11 DIAGNOSIS — M5459 Other low back pain: Secondary | ICD-10-CM | POA: Diagnosis not present

## 2022-02-11 DIAGNOSIS — M25551 Pain in right hip: Secondary | ICD-10-CM

## 2022-02-11 DIAGNOSIS — R262 Difficulty in walking, not elsewhere classified: Secondary | ICD-10-CM

## 2022-02-11 NOTE — Therapy (Signed)
OUTPATIENT PHYSICAL THERAPY TREATMENT NOTE   Patient Name: Christopher Escobar MRN: [id number] DOB:May 04, 1955, 66 y.o., male Today's Date: 02/13/2022   END OF SESSION:   PT End of Session - 02/13/22 1046     Visit Number 9    Number of Visits 13    Date for PT Re-Evaluation 02/20/22    Authorization Type [employer] 2023    Authorization Time Period initial eval 01/09/22    Progress Note Due on Visit 10    PT Start Time 1430    PT Stop Time 1518    PT Time Calculation (min) 48 min    Activity Tolerance Patient limited by pain    Behavior During Therapy [facility] for tasks assessed/performed                  Past Medical History:  Diagnosis Date   Diabetes mellitus without complication ([place]) 1749   Hypertension 2008   Personal history of colonic polyps    TA polyp   Sleep apnea    not using cpap   Past Surgical History:  Procedure Laterality Date   AMPUTATION Right 09/15/2019   Procedure: AMPUTATION RIGHT FOOT 2ND DIGIT;  Surgeon: Felipa Furnace, DPM;  Location: [place];  Service: Podiatry;  Laterality: Right;   AMPUTATION Left 02/08/2021   Procedure: AMPUTATION RAY-Partial 5th Ray;  Surgeon: [name], DPM;  Location: [facility];  Service: Podiatry;  Laterality: Left;   BACK SURGERY  1989   COLONOSCOPY  2013   COLONOSCOPY WITH PROPOFOL N/A 10/28/2016   Procedure: COLONOSCOPY WITH PROPOFOL;  Surgeon: Robert Bellow, MD;  Location: [facility];  Service: Endoscopy;  Laterality: N/A;   SHOULDER ARTHROSCOPY WITH SUBACROMIAL DECOMPRESSION Left 04/02/2017   Procedure: SHOULDER ARTHROSCOPY WITH DECOMPRESSION, DEBRIDEMENT, SLAP REPAIR,;  Surgeon: Corky Mull, MD;  Location: [facility];  Service: Orthopedics;  Laterality: Left;   SHOULDER SURGERY Bilateral 2011,2012   Patient Active Problem List   Diagnosis Date Noted   Hypotension    Benign prostatic hyperplasia without lower urinary tract symptoms    Sepsis without acute organ dysfunction ([place])    Diabetic polyneuropathy  associated with type 2 diabetes mellitus ([place])    Pyogenic inflammation of bone ([place]) 02/07/2021   Right foot infection 09/13/2019   HLD (hyperlipidemia) 09/13/2019   AKI (acute kidney injury) ([place]) 09/13/2019   Hyponatremia 09/13/2019   Class 2 obesity with body mass index (BMI) of 37.0 to 37.9 in adult 09/06/2019   Essential hypertension 06/03/2017   Degenerative joint disease of shoulder, left 12/03/2016   Incomplete tear of left rotator cuff 12/03/2016   Rotator cuff tendinitis, left 12/03/2016   Type 2 diabetes mellitus with hyperlipidemia ([place]) 11/03/2016   History of arthroscopy of left shoulder 11/03/2016   History of colonic polyps 10/08/2016     PCP: Albina Billet, MD   REFERRING PROVIDER: Geronimo Boot, PA-C   REFERRING DIAGNOSIS:  M54.41 (ICD-10-CM) - Bilateral low back pain with right-sided sciatica, unspecified chronicity  M54.16 (ICD-10-CM) - Lumbar radiculopathy  Z98.1 (ICD-10-CM) - S/P lumbar spinal fusion      THERAPY DIAG: No diagnosis found.   RATIONALE FOR EVALUATION AND TREATMENT: Rehabilitation   ONSET DATE: 10/03/21   FOLLOW UP APPT WITH PROVIDER: Yes 02/14/22    PERTINENT HISTORY: Patient is a 66 year old male with primary complaint of low back and R buttock pain.  Patient was seen at [facility] on 8/21/123 for LBP and right buttock pain.  Given medrol dose pack and  flexeril on 10/29/21. These did not help. PCP had him on percocet 7.5 as well. This took the edge off.  History of lumbar fusion L3-L5 in 1989.   He has constant LBP with right buttock and right posterior leg pain to his knee x 2-3 months. He has numbness/tingling in right leg. No left leg pain. Right buttock pain is his worst pain. Pain is worse with sitting. He does okay with walking. Can't sleep at night due to pain. Pt reports recently he was moving dog with his foot an had notable pain along R buttock after that. Patient reports that it hurts to sit on hard surfaces. Patient reports  numbness in R anterior thigh. Pt reports years of anterior thigh numbness prior to this incident involving R hip. Patient reports pain along R SI region that runs into buttock down to gluteal fold. Pt reports he also fell onto his R hip after this, which aggravated the area. Hx of Type 2 DM and hx of partial amputations along L and R foot. Pt reports no bowel/bladder changes. Pt reports disturbed sleep, he reports it is better if he rolls to his L side. Pt reports recent weight loss c trying to lose weight.    Pain:  Pain Intensity: Present: 4/10, Best: 3-4/10, Worst: 7-8/10 Pain location: R iliolumbar/R SI region down to R buttock Pain Quality: aching , constant Radiating: Yes to buttock/gluteal fold  Numbness/Tingling: Yes, longstanding numbness in R anterior thigh that seems to have worsened  Focal Weakness: weakness in RLE "once or twice"  Aggravating factors: sitting (especially on hard surface), yard work, lying on his R side, prolonged walking Relieving factors: reclined bed frame (adjustable bed frame), inversion/positional traction, pain medicine  24-hour pain behavior: None How long can you walk: to back yard and back to house  History of prior back injury, pain, surgery, or therapy: Yes, L3-5 fusion in 1989 Falls: Has patient fallen in last 6 months? 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  Red flags (bowel/bladder changes, saddle paresthesia, personal history of cancer, h/o spinal tumors, h/o compression fx, h/o abdominal aneurysm, abdominal pain, chills/fever, night sweats, nausea, vomiting, unrelenting pain, first onset of insidious LBP <20 y/o): Negative   Precautions: None   Weight Bearing Restrictions: No   Living Environment Lives with: lives with their family, pt lives with wife; 2 sons live close by   Lives in: House/apartment     Patient Goals: Patient wants more functionality, wants to be able to do more activity       PRECAUTIONS: S/p L3-5 lumbar fusion in 1989   OBJECTIVE: (objective measures completed at initial evaluation unless otherwise dated)  Patient Surveys  FOTO 23, predicted score 58    Gross Musculoskeletal Assessment Tremor: None Bulk: Normal Tone: Normal No visible step-off along spinal column, no signs of scoliosis. Well healed mid-line incision from old lumbar spinal fusion.     GAIT: Forward flexed posture through gait cycle, genu valgum increased during stance phase     Posture: Pt shifts weight to L ileum in sitting, FHRS posture Lumbar lordosis: Decreased Lumbar lateral shift: Negative       AROM       AROM (Normal range in degrees) AROM  01/14/2022  Lumbar    Flexion (65) 75% *pain with return to neutral upright  position   Extension (30) 25%  Right lateral flexion (25) 75%  Left lateral flexion (25) 75%  Right rotation (30) 75%*  Left rotation (30) 75%         Hip Right Left  Flexion (125) WNL    Extension (15)      Abduction (40)      Adduction       Internal Rotation (45) 15    External Rotation (45) 45           Knee      Flexion (135) WNL    Extension (0) WNL           Ankle      Dorsiflexion (20)      Plantarflexion (50)      Inversion (35)      Eversion (15      (* = pain; Blank rows = not tested)     LE MMT:   MMT (out of 5) Right 01/14/2022 Left 01/14/2022  Hip flexion 4+* 4+  Hip extension      Hip abduction 4-*    Hip adduction      Hip internal rotation      Hip external rotation      Knee flexion 5* 5  Knee extension 5* 5  Ankle dorsiflexion 5 5  Ankle plantarflexion      Ankle inversion      Ankle eversion      (* = pain; Blank rows = not tested)     Sensation Grossly intact to light touch bilateral LEs as determined by testing dermatomes L2-S2 with exception of sensation loss in bilateral  feet. Proprioception, and hot/cold testing deferred on this date.     Reflexes R/L Knee Jerk (L3/4): 2+/2+  Ankle Jerk (S1/2): 1+/1+  Clonus: Negative bilaterally Hoffman's sign: Negative bilaterally     Muscle Length Hamstrings: R: Positive, lacking 20 deg extension L: Positive, lacking 20 deg extension [place] (quadriceps): R: Positive L: Positive     Palpation   Location LEFT  RIGHT           Lumbar paraspinals 2 2  Quadratus Lumborum   2  Gluteus Maximus   2  Gluteus Medius   2  Deep hip external rotators      PSIS 1 1  Fortin's Area (SIJ)   2  Greater Trochanter   1 (mild)  (Blank rows = not tested) Graded on 0-4 scale (0 = no pain, 1 = pain, 2 = pain with wincing/grimacing/flinching, 3 = pain with withdrawal, 4 = unwilling to allow palpation), (Blank rows = not tested)     Passive Accessory Intervertebral Motion Deferred at lumbar spine due to hx of L3-5 fusion     SPECIAL TESTS Lumbar Radiculopathy and Discogenic: Slump (SN 83, -LR 0.32): R: Positive L: Negative SLR (SN 92, -LR 0.29): R: Negative L:  Negative   Facet Joint: Extension-Rotation (SN 100, -LR 0.0): R: Positive L: Negative   Lumbar Foraminal Stenosis: Lumbar quadrant (SN 70): R: Positive (mild), L: Negative   Hip: FABER (SN 81): R: Positive L: Positive FADIR (SN 94): R: Not examined L: Not examined Hip scour (SN 50): R: Negative L: Negative   SIJ:  Thigh Thrust (SN 88, -LR 0.18) : R: Negative L: Negative          TODAY'S TREATMENT     OBJECTIVE FINDINGS  AROM Lumbar flexion 75% Lumbar extension 75% "tight" in R flank  Lateral flexion: R 75%, L 50%*  Thoracolumbar rotation: R 75%*, L 100%    SUBJECTIVE: Patient reports pain is about the same. He reports positional intolerances and sleep difficulties are also about the same. He reports some soreness acutely after last visit, but he did "okay" toward the end of last week. Patient reports 3/10 pain at arrival. Patient reports having no  issue with his home exercises.   PAIN:  Are you having pain? Yes: NPRS scale: 3/10 Pain location: R buttock    Manual Therapy - for symptom modulation, soft tissue sensitivity and mobility, joint mobility, ROM   In supine: R hip manual lateral distraction with pt in hooklying; with Mulligan belt with 10 sec intermittent holds; x 3 minutes R hip mobilization with movement with Mulligan belt, c conjunct hip flexion and ER/IR; x 3 minutes   In prone lying with 2 pillows under pelvis: STM/DTM and IASTM with Hypervolt  R and L longissimus lumborum, R quadratus lumborum, R gluteus medius and maximus x 8 minutes DTM R gluteus medius; TPR at 2 locations x 3 minutes   *not today* R piriformis/gemelli ischemic compression with "passive pump" technique; x 2 minutes    Therapeutic Exercise - for improved soft tissue flexibility and extensibility as needed for ROM, graded exposure to thoracolumbar ROM  NuStep, 5 minutes, Level 3; utilized for improved soft tissue extensibility to reduce sensation of stiffness/tightness -subjective information gathered during this time   Open book; 1x10 lying on each side  Seated hamstring stretch, reviewed for HEP  Pt edu: HEP update and review. Dicussed next steps if pt is not responding well or making progress with conservative intervention/PT. Discussed different benchmarks utilized for measuring progress and expectations with conservative management c gradual progress and moderate treatment effects.     *not today* Piriformis stretch, figure-4 pull and push; 2x30 sec each, in hooklying  Lower trunk rotations, quadratus lumborum bias (figure-4 position); x10, 3 sec hold  Glute bridge; 2x10, with Blue Tband tied above knees  -verbal and tactile cueing to maintain taut band with hips abducted to neutral position Sidelying hip abduction; 2x10  -tactile and verbal cueing to avoid TFL/hip flexion compensation Sit to stand; with goblet hold c medicine ball;  10-lb Medball; 2x10   -verbal cueing and demo for posture Standing hip 3-way Red Tband; x8 ea dir, performed on each LE; light touch on edge of treatment table for balance  -verbal cueing and demonstration to limit hip hike and contralateral thoracolumbar sidebend versus hip abduction  Sidelying clamshelll; 2x10, Red Tband; tactile and verbal cueing to limit trunk rotation compensation Lower trunk rotations; x10 ea dir, hooklying Physioball rollout, blue ball; x5 ea dir, 5 sec hold  Hip abduction isometric; x10, 10 sec Open book; 1x10 on each side     Cold pack (unbilled) - for anti-inflammatory and analgesic effect as needed for reduced pain and improved ability to participate in active PT intervention, along R iliolumbar/gluteal region in prone lying with 2 pillows under pelvis, x 5 minutes     PATIENT EDUCATION:  Education details: see above for patient education details   Person educated: Patient Education method: Explanation, Demonstration, and Handouts Education comprehension: verbalized understanding and returned demonstration     HOME EXERCISE PROGRAM: Access Code: [email or web address] URL: [email or web address] Date: 02/13/2022 Prepared by: Valentina Gu  Exercises - Supine Lower Trunk Rotation  - 2 x daily - 7 x weekly - 2 sets - 10 reps - Supine Piriformis Stretch with Foot on Ground  - 2 x daily - 7 x  weekly - 3 sets - 30sec hold - Supine Figure 4 Piriformis Stretch  - 2 x daily - 7 x weekly - 3 sets - 30sec hold - Supine Bridge  - 1 x daily - 7 x weekly - 2 sets - 10 reps - Sidelying Thoracic Rotation with Open Book  - 2 x daily - 7 x weekly - 2 sets - 10 reps - 2-3sec hold - Seated Hamstring Stretch  - 2 x daily - 7 x weekly - 3 sets - 30sec hold     ASSESSMENT:   CLINICAL IMPRESSION: Patient unfortunately feels that, in spite of multimodal intervention including trials of dry needling and use of STM/DTM/IASTM and specific hip mobilization as well as  ongoing HEP/stretching program, his condition is unchanged at this time. When questioned about activity tolerance and positional tolerance, he reports these are also no different. Pt does demonstrate improvement in extension ROM and thoracolumbar rotation ROM tolerated. His NPRS has consistently remained at 3-4/10 when attending PT. Utilized aggressive DTM/TPR today along with use of regional interdependence model to address lower limb and thoracic spine mobility deficits to add to HEP. Pt reports less sensitivity to palpation following TPR along R gluteus medius today. Pt will be due for re-assessment next visit. If pt has continued poor appraisal of progress and feels no change in symptoms or activity limitations, but will need to return to referring provider to determine further need for medical management or workup with specialist.    REHAB POTENTIAL: Good   CLINICAL DECISION MAKING: Evolving/moderate complexity   EVALUATION COMPLEXITY: Moderate     GOALS:   SHORT TERM GOALS: Target date: 01/30/2022   Pt will be independent with HEP to improve strength and decrease back pain to improve pain-free function at home and work. Baseline: 01/09/22: Baseline HEP initiated Goal status: INITIAL     LONG TERM GOALS: Target date: 02/20/2022   Pt will increase FOTO to at least 58 to demonstrate significant improvement in function at home and work related to back pain  Baseline: 01/09/22: 44 Goal status: INITIAL   2.  Pt will decrease worst back pain by at least 2 points on the NPRS in order to demonstrate clinically significant reduction in back pain. Baseline: 01/09/22: Pain 7-8/10 at worst Goal status: INITIAL   3.  Pt will access 75% or greater thoracolumbar AROM without reproduction of symptoms as needed for reaching to floor and self-care activities e.g. bathing/dressing Baseline: 01/09/22: Mild to severe motion loss with all directions, pain with R rotation and return to neutral from lumbar  flexion Goal status: INITIAL   4.  Pt will ambulate 150 feet or greater without significant gait deviation/antalgic pattern and symmetrical weightbearing on each LE without reproduction of pain > 1-2/10 as needed for household mobility and short-distance community-level gait Baseline: 01/09/22: Significant pain with walking from his house to backyard Goal status: INITIAL   5.  Pt will sleep through the night without disturbance secondary to back or hip/thigh pain as needed for long-term sleep quality needed for lifelong health Baseline: 01/09/22: Poor sleep quality, pt states he can't sleep due to pain.  Goal status: INITIAL     PLAN: PT FREQUENCY: 2x/week   PT DURATION: 6 weeks   PLANNED INTERVENTIONS: Therapeutic exercises, Therapeutic activity, Neuromuscular re-education, Patient/Family education, Dry Needling, Electrical stimulation, Cryotherapy, Moist heat, Traction, and Manual therapy   PLAN FOR NEXT SESSION: Manual therapy, STM, modalities as needed for pain control. Quad/hamstrings flexibility. Graded movement and progressive loading  to develop resilience of affected tissues as tolerated. F/u on response with updated HEP. Re-assessment next visit    Valentina Gu, PT, DPT #[id number]  Eilleen Kempf, PT 02/13/2022, 10:56 AM

## 2022-02-12 NOTE — Progress Notes (Signed)
Referring Physician:  Albina Billet, MD [address]   [place],  [place] [address]  Primary Physician:  Albina Billet, MD  History of Present Illness: 02/14/2022  History of HTN, DM, and OSA. He has neuropathy in both feet.   Last seen by me on 01/03/22 for constant LBP with right buttock and right posterior leg pain to his knee. He had numbness/tingling in right leg. No left leg pain. Right buttock pain was his worst pain.    History of lumbar fusion L3-L5 in 1989. Xrays showed s mild DDD/spondylosis with previous L3-L5 fusion.   He was sent to PT (has done 10 visits) and is here for follow up. He does not think this is helping at all.   He continues with constant LBP with some right buttock pain. No pain into the posterior leg. He has intermittent numbness in right anterior thigh. No left leg pain. Pain is worse with sitting. He does okay with walking. Can't sleep at night due to pain.   History of HTN, DM, and OSA. He has neuropathy in both feet.   Bowel/Bladder Dysfunction: none  Conservative measures:  Physical therapy: at [facility] 10 visits from 01/09/22 to 02/11/22.  Multimodal medical therapy including regular antiinflammatories: percocet 7.5, medrol dose pack, flexeril, lyrica Injections: No recent epidural steroid injections  Past Surgery: Instrumented fusion L3-L5 in [place] has no symptoms of cervical myelopathy.  The symptoms are causing a significant impact on the patient's life.   Review of Systems:  A 10 point review of systems is negative, except for the pertinent positives and negatives detailed in the HPI.  Past Medical History: Past Medical History:  Diagnosis Date   Diabetes mellitus without complication ([place]) 5329   Hypertension 2008   Personal history of colonic polyps    TA polyp   Sleep apnea    not using cpap    Past Surgical History: Past Surgical History:  Procedure Laterality Date   AMPUTATION Right 09/15/2019    Procedure: AMPUTATION RIGHT FOOT 2ND DIGIT;  Surgeon: Felipa Furnace, DPM;  Location: [place];  Service: Podiatry;  Laterality: Right;   AMPUTATION Left 02/08/2021   Procedure: AMPUTATION RAY-Partial 5th Ray;  Surgeon: [name], DPM;  Location: [facility];  Service: Podiatry;  Laterality: Left;   BACK SURGERY  1989   COLONOSCOPY  2013   COLONOSCOPY WITH PROPOFOL N/A 10/28/2016   Procedure: COLONOSCOPY WITH PROPOFOL;  Surgeon: Robert Bellow, MD;  Location: [facility];  Service: Endoscopy;  Laterality: N/A;   SHOULDER ARTHROSCOPY WITH SUBACROMIAL DECOMPRESSION Left 04/02/2017   Procedure: SHOULDER ARTHROSCOPY WITH DECOMPRESSION, DEBRIDEMENT, SLAP REPAIR,;  Surgeon: Corky Mull, MD;  Location: [facility];  Service: Orthopedics;  Laterality: Left;   SHOULDER SURGERY Bilateral 2011,2012    Allergies: Allergies as of 02/14/2022   (No Known Allergies)    Medications: Outpatient Encounter Medications as of 02/14/2022  Medication Sig   oxyCODONE (ROXICODONE) 15 MG immediate release tablet Take 15 mg by mouth every 6 (six) hours as needed.   OZEMPIC, 1 MG/DOSE, 4 MG/3ML SOPN Inject into the skin once a week.   acetaminophen (TYLENOL) 325 MG tablet Take 2 tablets (650 mg total) by mouth every 6 (six) hours as needed for mild pain (or Fever >/= 101).   atorvastatin (LIPITOR) 40 MG tablet Take 40 mg by mouth daily at 6 PM.    collagenase (SANTYL) ointment Apply 1 application topically daily.   insulin  detemir (LEVEMIR) 100 UNIT/ML injection Inject 60 Units into the skin at bedtime.   JARDIANCE 25 MG TABS tablet Take 25 mg by mouth daily.   metoprolol succinate (TOPROL-XL) 25 MG 24 hr tablet Take 25 mg every morning by mouth.    multivitamin-lutein (OCUVITE-LUTEIN) CAPS capsule Take 1 capsule by mouth 2 (two) times daily.   mupirocin ointment (BACTROBAN) 2 % Apply 1 application topically 2 (two) times daily.   NOVOLOG FLEXPEN 100 UNIT/ML FlexPen Inject 7-10 Units into the skin 2 (two) times  daily before a meal.   polyethylene glycol (MIRALAX) 17 g packet Take 17 g by mouth daily as needed.   pregabalin (LYRICA) 75 MG capsule Take 75 mg by mouth 2 (two) times daily.    senna (SENOKOT) 8.6 MG TABS tablet Take 2 tablets (17.2 mg total) by mouth at bedtime.   tamsulosin (FLOMAX) 0.4 MG CAPS capsule Take 0.4 mg by mouth daily.   No facility-administered encounter medications on file as of 02/14/2022.    Social History: Social History   Tobacco Use   Smoking status: Former    Packs/day: 1.00    Years: 30.00    Total pack years: 30.00    Types: Cigarettes    Quit date: 02/03/2007    Years since quitting: 15.0   Smokeless tobacco: Never  Vaping Use   Vaping Use: Never used  Substance Use Topics   Alcohol use: Yes    Comment: seldom   Drug use: No    Family Medical History: History reviewed. No pertinent family history.  Physical Examination: Vitals:   02/14/22 1047  BP: 123/81  Pulse: 83      Awake, alert, oriented to person, place, and time.  Speech is clear and fluent. Fund of knowledge is appropriate.   Cranial Nerves: Pupils equal round and reactive to light.  Facial tone is symmetric.  Facial sensation is symmetric.  Mild lower lumbar tenderness with point tenderness over right SI joint.   Strength:  Side Iliopsoas Quads Hamstring PF DF EHL  R '5 5 5 5 5 5  '$ L '5 5 5 5 5 5   '$ Reflexes are 2+ and symmetric at the patella and achilles.     Bilateral upper and lower extremity sensation is intact to light touch.     Gait is normal.     Medical Decision Making  Imaging: None to review   Assessment and Plan: Mr. Intriago is a pleasant 66 y.o. male with constant LBP with some right buttock pain. No pain into the posterior leg. He has intermittent numbness in right anterior thigh. No left leg pain. Pain is worse with sitting.   History of lumbar fusion L3-L5 in 1989.    He has known mild DDD/spondylosis with previous L3-L5 fusion, likely has adjacent  segment disease. He continues with significant tenderness over right SI joint.   Treatment options discussed with patient and following plan made:   - MRI of lumbar spine to further evaluate pain. He has failed PT, time, and medications.  - Stop PT as it is not helping (did 10 visits).  - Depending on results of MRI, may consider referral for injections (SI versus lumbar?).  - Will set up phone visit once I have MRI results.   I spent a total of 15 minutes in face-to-face and non-face-to-face activities related to this patient's care today.  Geronimo Boot PA-C Dept. of Neurosurgery

## 2022-02-13 ENCOUNTER — Ambulatory Visit: Payer: Medicare HMO | Admitting: Physical Therapy

## 2022-02-13 ENCOUNTER — Encounter: Payer: Self-pay | Admitting: Physical Therapy

## 2022-02-13 DIAGNOSIS — M25551 Pain in right hip: Secondary | ICD-10-CM

## 2022-02-13 DIAGNOSIS — M5459 Other low back pain: Secondary | ICD-10-CM

## 2022-02-13 DIAGNOSIS — R262 Difficulty in walking, not elsewhere classified: Secondary | ICD-10-CM

## 2022-02-13 DIAGNOSIS — M6281 Muscle weakness (generalized): Secondary | ICD-10-CM

## 2022-02-13 NOTE — Therapy (Signed)
OUTPATIENT PHYSICAL THERAPY TREATMENT AND PROGRESS NOTE   Dates of reporting period  01/09/22   to   02/13/22    Patient Name: Christopher Escobar MRN: [id number] DOB:1955-05-20, 66 y.o., male Today's Date: 02/15/2022   END OF SESSION:   PT End of Session - 02/15/22 2134     Visit Number 10    Number of Visits 13    Date for PT Re-Evaluation 02/20/22    Authorization Type [employer] 2023    Authorization Time Period initial eval 01/09/22    Progress Note Due on Visit 10    PT Start Time 1300    PT Stop Time 1345    PT Time Calculation (min) 45 min    Activity Tolerance Patient limited by pain    Behavior During Therapy [facility] for tasks assessed/performed                   Past Medical History:  Diagnosis Date   Diabetes mellitus without complication ([place]) 4098   Hypertension 2008   Personal history of colonic polyps    TA polyp   Sleep apnea    not using cpap   Past Surgical History:  Procedure Laterality Date   AMPUTATION Right 09/15/2019   Procedure: AMPUTATION RIGHT FOOT 2ND DIGIT;  Surgeon: Felipa Furnace, DPM;  Location: [place];  Service: Podiatry;  Laterality: Right;   AMPUTATION Left 02/08/2021   Procedure: AMPUTATION RAY-Partial 5th Ray;  Surgeon: [name], DPM;  Location: [facility];  Service: Podiatry;  Laterality: Left;   BACK SURGERY  1989   COLONOSCOPY  2013   COLONOSCOPY WITH PROPOFOL N/A 10/28/2016   Procedure: COLONOSCOPY WITH PROPOFOL;  Surgeon: Robert Bellow, MD;  Location: [facility];  Service: Endoscopy;  Laterality: N/A;   SHOULDER ARTHROSCOPY WITH SUBACROMIAL DECOMPRESSION Left 04/02/2017   Procedure: SHOULDER ARTHROSCOPY WITH DECOMPRESSION, DEBRIDEMENT, SLAP REPAIR,;  Surgeon: Corky Mull, MD;  Location: [facility];  Service: Orthopedics;  Laterality: Left;   SHOULDER SURGERY Bilateral 2011,2012   Patient Active Problem List   Diagnosis Date Noted   Hypotension    Benign prostatic hyperplasia without lower urinary tract symptoms     Sepsis without acute organ dysfunction ([place])    Diabetic polyneuropathy associated with type 2 diabetes mellitus ([place])    Pyogenic inflammation of bone ([place]) 02/07/2021   Right foot infection 09/13/2019   HLD (hyperlipidemia) 09/13/2019   AKI (acute kidney injury) ([place]) 09/13/2019   Hyponatremia 09/13/2019   Class 2 obesity with body mass index (BMI) of 37.0 to 37.9 in adult 09/06/2019   Essential hypertension 06/03/2017   Degenerative joint disease of shoulder, left 12/03/2016   Incomplete tear of left rotator cuff 12/03/2016   Rotator cuff tendinitis, left 12/03/2016   Type 2 diabetes mellitus with hyperlipidemia ([place]) 11/03/2016   History of arthroscopy of left shoulder 11/03/2016   History of colonic polyps 10/08/2016     PCP: Albina Billet, MD   REFERRING PROVIDER: Geronimo Boot, PA-C   REFERRING DIAGNOSIS:  M54.41 (ICD-10-CM) - Bilateral low back pain with right-sided sciatica, unspecified chronicity  M54.16 (ICD-10-CM) - Lumbar radiculopathy  Z98.1 (ICD-10-CM) - S/P lumbar spinal fusion      THERAPY DIAG: No diagnosis found.   RATIONALE FOR EVALUATION AND TREATMENT: Rehabilitation   ONSET DATE: 10/03/21   FOLLOW UP APPT WITH PROVIDER: Yes 02/14/22    PERTINENT HISTORY: Patient is a 67 year old male with primary complaint of low back and R buttock pain.  Patient was  seen at [facility] on 8/21/123 for LBP and right buttock pain.  Given medrol dose pack and flexeril on 10/29/21. These did not help. PCP had him on percocet 7.5 as well. This took the edge off.  History of lumbar fusion L3-L5 in 1989.   He has constant LBP with right buttock and right posterior leg pain to his knee x 2-3 months. He has numbness/tingling in right leg. No left leg pain. Right buttock pain is his worst pain. Pain is worse with sitting. He does okay with walking. Can't sleep at night due to pain. Pt reports recently he was moving dog with his foot an had notable pain along R buttock after that.  Patient reports that it hurts to sit on hard surfaces. Patient reports numbness in R anterior thigh. Pt reports years of anterior thigh numbness prior to this incident involving R hip. Patient reports pain along R SI region that runs into buttock down to gluteal fold. Pt reports he also fell onto his R hip after this, which aggravated the area. Hx of Type 2 DM and hx of partial amputations along L and R foot. Pt reports no bowel/bladder changes. Pt reports disturbed sleep, he reports it is better if he rolls to his L side. Pt reports recent weight loss c trying to lose weight.    Pain:  Pain Intensity: Present: 4/10, Best: 3-4/10, Worst: 7-8/10 Pain location: R iliolumbar/R SI region down to R buttock Pain Quality: aching , constant Radiating: Yes to buttock/gluteal fold  Numbness/Tingling: Yes, longstanding numbness in R anterior thigh that seems to have worsened  Focal Weakness: weakness in RLE "once or twice"  Aggravating factors: sitting (especially on hard surface), yard work, lying on his R side, prolonged walking Relieving factors: reclined bed frame (adjustable bed frame), inversion/positional traction, pain medicine  24-hour pain behavior: None How long can you walk: to back yard and back to house  History of prior back injury, pain, surgery, or therapy: Yes, L3-5 fusion in 1989 Falls: Has patient fallen in last 6 months? 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  Red flags (bowel/bladder changes, saddle paresthesia, personal history of cancer, h/o spinal tumors, h/o compression fx, h/o abdominal aneurysm, abdominal pain, chills/fever, night sweats, nausea, vomiting, unrelenting pain, first onset of insidious LBP <18 y/o): Negative   Precautions: None   Weight Bearing Restrictions: No   Living Environment Lives  with: lives with their family, pt lives with wife; 2 sons live close by  Lives in: House/apartment     Patient Goals: Patient wants more functionality, wants to be able to do more activity       PRECAUTIONS: S/p L3-5 lumbar fusion in 1989   OBJECTIVE: (objective measures completed at initial evaluation unless otherwise dated)  Patient Surveys  FOTO 73, predicted score 58    Gross Musculoskeletal Assessment Tremor: None Bulk: Normal Tone: Normal No visible step-off along spinal column, no signs of scoliosis. Well healed mid-line incision from old lumbar spinal fusion.     GAIT: Forward flexed posture through gait cycle, genu valgum increased during stance phase     Posture: Pt shifts weight to L ileum in sitting, FHRS posture Lumbar lordosis: Decreased Lumbar lateral shift: Negative       AROM        AROM (Normal range  in degrees) AROM  01/14/2022 AROM 02/13/22  Lumbar     Flexion (65) 75% *pain with return to neutral upright position  75%* (mild pain)  Extension (30) 25% 50%  Right lateral flexion (25) 75% 50%*  Left lateral flexion (25) 75% 50%  Right rotation (30) 75%* WNL  Left rotation (30) 75% WNL          Hip Right Left   Flexion (125) WNL     Extension (15)       Abduction (40)       Adduction        Internal Rotation (45) 15     External Rotation (45) 45             Knee       Flexion (135) WNL     Extension (0) WNL             Ankle       Dorsiflexion (20)       Plantarflexion (50)       Inversion (35)       Eversion (15       (* = pain; Blank rows = not tested)     LE MMT:   MMT (out of 5) Right 01/14/2022 Left 01/14/2022 Right 02/13/2022 Left 02/13/2022  Hip flexion 4+* 4+ 4+ 4+  Hip extension        Hip abduction 4-*   4+ 4+  Hip adduction        Hip internal rotation        Hip external rotation        Knee flexion 5* 5    Knee extension 5* 5    Ankle dorsiflexion 5 5    Ankle plantarflexion        Ankle inversion         Ankle eversion        (* = pain; Blank rows = not tested)     Sensation Grossly intact to light touch bilateral LEs as determined by testing dermatomes L2-S2 with exception of sensation loss in bilateral feet. Proprioception, and hot/cold testing deferred on this date.     Reflexes R/L Knee Jerk (L3/4): 2+/2+  Ankle Jerk (S1/2): 1+/1+  Clonus: Negative bilaterally Hoffman's sign: Negative bilaterally     Muscle Length Hamstrings: R: Positive, lacking 20 deg extension L: Positive, lacking 20 deg extension [place] (quadriceps): R: Positive L: Positive     Palpation   Location LEFT  RIGHT           Lumbar paraspinals 2 2  Quadratus Lumborum   2  Gluteus Maximus   2  Gluteus Medius   2  Deep hip external rotators      PSIS 1 1  Fortin's Area (SIJ)   2  Greater Trochanter   1 (mild)  (Blank rows = not tested) Graded on 0-4 scale (0 = no pain, 1 = pain, 2 = pain with wincing/grimacing/flinching, 3 = pain with withdrawal, 4 = unwilling to allow palpation), (Blank rows = not tested)     Passive Accessory Intervertebral Motion Deferred at lumbar spine due to hx of L3-5 fusion     SPECIAL TESTS Lumbar Radiculopathy and Discogenic: Slump (SN 83, -LR 0.32): R: Positive L: Negative SLR (SN 92, -LR 0.29): R: Negative L:  Negative   Facet Joint: Extension-Rotation (SN 100, -LR 0.0): R: Positive L: Negative   Lumbar Foraminal Stenosis: Lumbar quadrant (SN 70): R: Positive (mild), L: Negative  Hip: FABER (SN 81): R: Positive L: Positive FADIR (SN 94): R: Not examined L: Not examined Hip scour (SN 50): R: Negative L: Negative   SIJ:  Thigh Thrust (SN 88, -LR 0.18) : R: Negative L: Negative          TODAY'S TREATMENT    SUBJECTIVE: Patient reports he has not progressed much since initiating PT. Patient reports ongoing difficulty with sitting on hard surfaces now. Pt reports he gets his yard work done; he states his pain is affected by gluteal pain, but it does not stop  him from the activity. Pt reports ongoing difficulty with walking presently. Patient reports ongoing sleep quality issues. No notable change in positional tolerance, activity limitations, or pain rating.   PAIN:  Are you having pain? Yes: NPRS scale: 3/10 Pain location: R buttock    Manual Therapy - for symptom modulation, soft tissue sensitivity and mobility, joint mobility, ROM     In prone lying with 2 pillows under pelvis: STM/DTM and IASTM with Hypervolt  R and L longissimus lumborum, R quadratus lumborum, R gluteus medius and maximus x 8 minutes DTM R gluteus medius; TPR at 2 locations x 3 minutes   *not today* R piriformis/gemelli ischemic compression with "passive pump" technique; x 2 minutes    Therapeutic Exercise - for improved soft tissue flexibility and extensibility as needed for ROM, graded exposure to thoracolumbar ROM  GOAL UPDATE PERFORMED  NuStep, 5 minutes, Level 3; utilized for improved soft tissue extensibility to reduce sensation of stiffness/tightness -subjective information gathered during this time    Pt edu: Discussed current progress with PT, expectations with conservative management, plan for return to referring provider and holding on PT due to insignificant progress    *not today* Open book; 1x10 lying on each side  Seated hamstring stretch, reviewed for HEP Piriformis stretch, figure-4 pull and push; 2x30 sec each, in hooklying  Lower trunk rotations, quadratus lumborum bias (figure-4 position); x10, 3 sec hold  Glute bridge; 2x10, with Blue Tband tied above knees  -verbal and tactile cueing to maintain taut band with hips abducted to neutral position Sidelying hip abduction; 2x10  -tactile and verbal cueing to avoid TFL/hip flexion compensation Sit to stand; with goblet hold c medicine ball; 10-lb Medball; 2x10   -verbal cueing and demo for posture Standing hip 3-way Red Tband; x8 ea dir, performed on each LE; light touch on edge of  treatment table for balance  -verbal cueing and demonstration to limit hip hike and contralateral thoracolumbar sidebend versus hip abduction  Sidelying clamshelll; 2x10, Red Tband; tactile and verbal cueing to limit trunk rotation compensation Lower trunk rotations; x10 ea dir, hooklying Physioball rollout, blue ball; x5 ea dir, 5 sec hold  Hip abduction isometric; x10, 10 sec Open book; 1x10 on each side     Cold pack (unbilled) - for anti-inflammatory and analgesic effect as needed for reduced pain and improved ability to participate in active PT intervention, along R iliolumbar/gluteal region in prone lying with 2 pillows under pelvis, x 5 minutes     PATIENT EDUCATION:  Education details: see above for patient education details   Person educated: Patient Education method: Explanation, Demonstration, and Handouts Education comprehension: verbalized understanding and returned demonstration     HOME EXERCISE PROGRAM: Access Code: [email or web address] URL: [email or web address] Date: 02/13/2022 Prepared by: Valentina Gu  Exercises - Supine Lower Trunk Rotation  - 2 x daily - 7 x weekly - 2 sets - 10 reps - Supine Piriformis Stretch  with Foot on Ground  - 2 x daily - 7 x weekly - 3 sets - 30sec hold - Supine Figure 4 Piriformis Stretch  - 2 x daily - 7 x weekly - 3 sets - 30sec hold - Supine Bridge  - 1 x daily - 7 x weekly - 2 sets - 10 reps - Sidelying Thoracic Rotation with Open Book  - 2 x daily - 7 x weekly - 2 sets - 10 reps - 2-3sec hold - Seated Hamstring Stretch  - 2 x daily - 7 x weekly - 3 sets - 30sec hold     ASSESSMENT:   CLINICAL IMPRESSION: Patient reports no significant progress with pain, activity tolerance, functional abilities, or mobility. Pt has demonstrated modest gains with trunk rotation and lumbar extension ROM tolerated, though these small changes may not be significant. Pt has consistently reported NPRS 3-4/10. Pt has reported poor subjective  appraisal of progress and symptomatic response in spite of multi-model intervention including dry needling, STM/IASTM, Mulligan mobilization for coxofemoral mobility and pain control, specific stretching, isometric exercise and graded loading for affected hip/iliolumbar region, education, and modalities for pain control. Pt has given good faith effort and has continued with home exercise program. He has improved FOTO score, but has not yet met FOTO goal. Pt has ongoing sleep disturbance and sleep quality/quantity issues. Given poor progress to date, patient will need to return to referring provider to consider further needs for medical management and/or further diagnostic workup. Pt is not appropriate for continued PT given minimal progress. Will discharge following today's progress note.    REHAB POTENTIAL: Good   CLINICAL DECISION MAKING: Evolving/moderate complexity   EVALUATION COMPLEXITY: Moderate     GOALS:   SHORT TERM GOALS: Target date: 01/30/2022   Pt will be independent with HEP to improve strength and decrease back pain to improve pain-free function at home and work. Baseline: 01/09/22: Baseline HEP initiated.  02/13/22: Compliant with HEP and pt verbalizes understanding of exercises.  Goal status: ACHIEVED     LONG TERM GOALS: Target date: 02/20/2022   Pt will increase FOTO to at least 58 to demonstrate significant improvement in function at home and work related to back pain  Baseline: 01/09/22: 44.   02/13/22: 57/58 Goal status: IN PROGRESS   2.  Pt will decrease worst back pain by at least 2 points on the NPRS in order to demonstrate clinically significant reduction in back pain. Baseline: 01/09/22: Pain 7-8/10 at worst.   02/13/22: 6/10 at worst  Goal status: ON-GOING   3.  Pt will access 75% or greater thoracolumbar AROM without reproduction of symptoms as needed for reaching to floor and self-care activities e.g. bathing/dressing Baseline: 01/09/22: Mild to severe  motion loss with all directions, pain with R rotation and return to neutral from lumbar flexion.   02/13/22: Modest improvement in trunk extension tolerated and thoracolumbar rotation tolerated; lateral flexion is less than that noted at eval.   Goal status: NOT MET   4.  Pt will ambulate 150 feet or greater without significant gait deviation/antalgic pattern and symmetrical weightbearing on each LE without reproduction of pain > 1-2/10 as needed for household mobility and short-distance community-level gait Baseline: 01/09/22: Significant pain with walking from his house to backyard.   02/13/22: Ongoing pain with ambulation at home or community-level distance  Goal status: NOT MET   5.  Pt will sleep through the night without disturbance secondary to back or hip/thigh pain as needed for long-term sleep  quality needed for lifelong health Baseline: 01/09/22: Poor sleep quality, pt states he can't sleep due to pain.  02/13/22: ongoing sleep disturbance and difficulty sleeping.   Goal status: NOT MET      PLAN: PT FREQUENCY: -   PT DURATION: -   PLANNED INTERVENTIONS: Therapeutic exercises, Therapeutic activity, Neuromuscular re-education, Patient/Family education, Dry Needling, Electrical stimulation, Cryotherapy, Moist heat, Traction, and Manual therapy   PLAN FOR NEXT SESSION: Pt to f/u with referring provider. Discharge for this episode of care.     Valentina Gu, PT, DPT #[id number]  Eilleen Kempf, PT 02/15/2022, 9:34 PM

## 2022-02-14 ENCOUNTER — Ambulatory Visit: Payer: Medicare HMO | Admitting: Orthopedic Surgery

## 2022-02-14 ENCOUNTER — Encounter: Payer: Self-pay | Admitting: Orthopedic Surgery

## 2022-02-14 VITALS — BP 123/81 | HR 83 | Ht 71.0 in | Wt 247.6 lb

## 2022-02-14 DIAGNOSIS — M47816 Spondylosis without myelopathy or radiculopathy, lumbar region: Secondary | ICD-10-CM | POA: Diagnosis not present

## 2022-02-14 DIAGNOSIS — M5136 Other intervertebral disc degeneration, lumbar region: Secondary | ICD-10-CM

## 2022-02-14 DIAGNOSIS — Z981 Arthrodesis status: Secondary | ICD-10-CM

## 2022-02-18 ENCOUNTER — Ambulatory Visit: Payer: Medicare HMO | Admitting: Physical Therapy

## 2022-02-27 ENCOUNTER — Ambulatory Visit
Admission: RE | Admit: 2022-02-27 | Discharge: 2022-02-27 | Disposition: A | Discharge status: Home / Self Care | Payer: Medicare HMO | Source: Ambulatory Visit | Attending: Orthopedic Surgery | Admitting: Orthopedic Surgery

## 2022-02-27 DIAGNOSIS — M47816 Spondylosis without myelopathy or radiculopathy, lumbar region: Secondary | ICD-10-CM | POA: Insufficient documentation

## 2022-02-27 DIAGNOSIS — Z981 Arthrodesis status: Secondary | ICD-10-CM | POA: Diagnosis not present

## 2022-02-27 DIAGNOSIS — M5136 Other intervertebral disc degeneration, lumbar region: Secondary | ICD-10-CM | POA: Insufficient documentation

## 2022-03-02 NOTE — Progress Notes (Unsigned)
Cardiology Office Note  Date:  03/03/2022   ID:  Christopher, Escobar [birth year], MRN [id number]  PCP:  Albina Billet, MD   Chief Complaint  Patient presents with   New Patient (Initial Visit)    Ref by Dr. Hall Busing for shortness of breath on exertion & PAC's on recent EKG. Medications reviewed by the patient verbally.     HPI:  Christopher Escobar is a 66 year old gentleman with past medical history of Diabetes with complications, ulcers of feet, prior amputations, neuropathy Hypertension Obstructive sleep apnea Hyperlipidemia Former smoker, quit 2008 Back surgery L3-L5 fusion PAD, atherosclerosis of aorta,  iliac vessels on CT  Who presents by referral from Dr. Hall Busing for dyspnea on exertion, abnormal EKG  On discussion today, reports having shortness of breath for months to years No regular exercise program Limited secondary to toe amputations bilaterally Normal ABIs April 2023  Abdominal CT scan August 2019 Images pulled up and reviewed showing moderate mid to distal aortic atherosclerosis extending into the iliac vessels  Chronic low back pain, right buttock pain Lab work requested from primary care A1C 6.3 per the patient  EKG personally reviewed by myself on todays visit Normal sinus rhythm with rare PVC poor R wave progression to the anterior precordial leads, left axis deviation   PMH:   has a past medical history of Diabetes mellitus without complication ([place]) (5053), Hypertension (2008), Personal history of colonic polyps, and Sleep apnea.  PSH:    Past Surgical History:  Procedure Laterality Date   AMPUTATION Right 09/15/2019   Procedure: AMPUTATION RIGHT FOOT 2ND DIGIT;  Surgeon: Felipa Furnace, DPM;  Location: [facility];  Service: Podiatry;  Laterality: Right;   AMPUTATION Left 02/08/2021   Procedure: AMPUTATION RAY-Partial 5th Ray;  Surgeon: [name], DPM;  Location: [facility];  Service: Podiatry;  Laterality: Left;   BACK SURGERY  1989   COLONOSCOPY  2013    COLONOSCOPY WITH PROPOFOL N/A 10/28/2016   Procedure: COLONOSCOPY WITH PROPOFOL;  Surgeon: Robert Bellow, MD;  Location: [facility];  Service: Endoscopy;  Laterality: N/A;   SHOULDER ARTHROSCOPY WITH SUBACROMIAL DECOMPRESSION Left 04/02/2017   Procedure: SHOULDER ARTHROSCOPY WITH DECOMPRESSION, DEBRIDEMENT, SLAP REPAIR,;  Surgeon: Corky Mull, MD;  Location: [facility];  Service: Orthopedics;  Laterality: Left;   SHOULDER SURGERY Bilateral 2011,2012    Current Outpatient Medications  Medication Sig Dispense Refill   acetaminophen (TYLENOL) 325 MG tablet Take 2 tablets (650 mg total) by mouth every 6 (six) hours as needed for mild pain (or Fever >/= 101).     atorvastatin (LIPITOR) 40 MG tablet Take 40 mg by mouth daily at 6 PM.      collagenase (SANTYL) ointment Apply 1 application topically daily. 30 g 1   insulin detemir (LEVEMIR) 100 UNIT/ML injection Inject 60 Units into the skin at bedtime.     JARDIANCE 25 MG TABS tablet Take 25 mg by mouth daily.     metoprolol succinate (TOPROL-XL) 25 MG 24 hr tablet Take 25 mg every morning by mouth.      multivitamin-lutein (OCUVITE-LUTEIN) CAPS capsule Take 1 capsule by mouth 2 (two) times daily.     mupirocin ointment (BACTROBAN) 2 % Apply 1 application topically 2 (two) times daily. 22 g 0   NOVOLOG FLEXPEN 100 UNIT/ML FlexPen Inject 7-10 Units into the skin 2 (two) times daily before a meal.     oxyCODONE (ROXICODONE) 15 MG immediate release tablet Take 15 mg by mouth every 6 (six)  hours as needed.     OZEMPIC, 1 MG/DOSE, 4 MG/3ML SOPN Inject into the skin once a week.     polyethylene glycol (MIRALAX) 17 g packet Take 17 g by mouth daily as needed. 30 each 0   pregabalin (LYRICA) 75 MG capsule Take 75 mg by mouth 2 (two) times daily.      senna (SENOKOT) 8.6 MG TABS tablet Take 2 tablets (17.2 mg total) by mouth at bedtime. 60 tablet 0   tamsulosin (FLOMAX) 0.4 MG CAPS capsule Take 0.4 mg by mouth daily.     No current  facility-administered medications for this visit.    Allergies:   Patient has no known allergies.   Social History:  The patient  reports that he quit smoking about 15 years ago. His smoking use included cigarettes. He has a 30.00 pack-year smoking history. He has never used smokeless tobacco. He reports current alcohol use. He reports that he does not use drugs.   Family History:   family history includes Heart attack (age of onset: 48) in his father; Heart attack (age of onset: 50) in his brother; Heart attack (age of onset: 54) in his brother.    Review of Systems: Review of Systems  Constitutional: Negative.   HENT: Negative.    Respiratory:  Positive for shortness of breath.   Cardiovascular:  Positive for chest pain.  Gastrointestinal: Negative.   Musculoskeletal:  Positive for back pain.  Neurological: Negative.   Psychiatric/Behavioral: Negative.    All other systems reviewed and are negative.    PHYSICAL EXAM: VS:  BP 128/78 (BP Location: Right Arm, Patient Position: Sitting, Cuff Size: Large)   Pulse 89   Ht '5\' 11"'$  (1.803 m)   Wt 247 lb 8 oz (112.3 kg)   SpO2 98%   BMI 34.52 kg/m  , BMI Body mass index is 34.52 kg/m. GEN: Well nourished, well developed, in no acute distress HEENT: normal Neck: no JVD, carotid bruits, or masses Cardiac: RRR; no murmurs, rubs, or gallops,no edema  Respiratory:  clear to auscultation bilaterally, normal work of breathing GI: soft, nontender, nondistended, + BS MS: no deformity or atrophy Skin: warm and dry, no rash Neuro:  Strength and sensation are intact Psych: euthymic mood, full affect   Recent Labs: No results found for requested labs within last 365 days.    Lipid Panel No results found for: "CHOL", "HDL", "LDLCALC", "TRIG"    Wt Readings from Last 3 Encounters:  03/03/22 247 lb 8 oz (112.3 kg)  02/14/22 247 lb 9.6 oz (112.3 kg)  01/03/22 242 lb 9.6 oz (110 kg)       ASSESSMENT AND PLAN:  Problem List Items  Addressed This Visit       Cardiology Problems   Type 2 diabetes mellitus with hyperlipidemia ([place])   Essential hypertension   HLD (hyperlipidemia)   Other Visit Diagnoses     Shortness of breath on exertion    -  Primary   Relevant Orders   EKG 12-Lead   ECHOCARDIOGRAM COMPLETE   NM Myocar Multi W/Spect W/Wall Motion / EF   Angina pectoris ([place])       Relevant Orders   EKG 12-Lead   NM Myocar Multi W/Spect W/Wall Motion / EF   Coronary artery disease of native artery of native heart with stable angina pectoris ([place])       Relevant Orders   EKG 12-Lead   NM Myocar Multi W/Spect W/Wall Motion / EF  Angina/shortness of breath/coronary artery disease Coronary calcification seen on CT scan 2019 Risk factors for coronary disease including long history of smoking, diabetes, hyperlipidemia Recommended ischemic work-up, we have ordered Myoview We have also ordered echocardiogram to evaluate ejection fraction, rule out pulmonary hypertension  PAD Moderate diffuse aortic and iliac disease noted on CT scan 2019 Unclear if this is playing a role in his periodic buttock discomfort Prior amputation of toes bilaterally  Hyperlipidemia On Lipitor 40 daily Lab work requested from primary care, goal LDL less than 70  Diabetes type 2 with complications Reports I6O 6.3, lab work requested Nonhealing ulcer on toes requiring amputation bilaterally per the patient We have encouraged continued careful diet management in an effort to lose weight.  Essential hypertension Blood pressure is well controlled on today's visit. No changes made to the medications.  PVCs Noted on EKG, asymptomatic, continue metoprolol succinate 25 daily   Total encounter time more than 60 minutes  Greater than 50% was spent in counseling and coordination of care with the patient    Signed, Esmond Plants, M.D., Ph.D. [place], [place]

## 2022-03-03 ENCOUNTER — Encounter: Payer: Self-pay | Admitting: Orthopedic Surgery

## 2022-03-03 ENCOUNTER — Ambulatory Visit: Payer: Medicare HMO | Attending: Cardiovascular Disease | Admitting: Cardiovascular Disease

## 2022-03-03 ENCOUNTER — Ambulatory Visit (INDEPENDENT_AMBULATORY_CARE_PROVIDER_SITE_OTHER): Payer: Medicare HMO | Admitting: Orthopedic Surgery

## 2022-03-03 ENCOUNTER — Encounter: Payer: Self-pay | Admitting: Cardiovascular Disease

## 2022-03-03 VITALS — BP 128/78 | HR 89 | Ht 71.0 in | Wt 247.5 lb

## 2022-03-03 DIAGNOSIS — E782 Mixed hyperlipidemia: Secondary | ICD-10-CM

## 2022-03-03 DIAGNOSIS — I444 Left anterior fascicular block: Secondary | ICD-10-CM | POA: Diagnosis not present

## 2022-03-03 DIAGNOSIS — R0602 Shortness of breath: Secondary | ICD-10-CM | POA: Diagnosis not present

## 2022-03-03 DIAGNOSIS — M5136 Other intervertebral disc degeneration, lumbar region: Secondary | ICD-10-CM | POA: Diagnosis not present

## 2022-03-03 DIAGNOSIS — I10 Essential (primary) hypertension: Secondary | ICD-10-CM

## 2022-03-03 DIAGNOSIS — E1169 Type 2 diabetes mellitus with other specified complication: Secondary | ICD-10-CM

## 2022-03-03 DIAGNOSIS — I25118 Atherosclerotic heart disease of native coronary artery with other forms of angina pectoris: Secondary | ICD-10-CM | POA: Diagnosis not present

## 2022-03-03 DIAGNOSIS — M48061 Spinal stenosis, lumbar region without neurogenic claudication: Secondary | ICD-10-CM

## 2022-03-03 DIAGNOSIS — M533 Sacrococcygeal disorders, not elsewhere classified: Secondary | ICD-10-CM | POA: Diagnosis not present

## 2022-03-03 DIAGNOSIS — I209 Angina pectoris, unspecified: Secondary | ICD-10-CM

## 2022-03-03 DIAGNOSIS — E785 Hyperlipidemia, unspecified: Secondary | ICD-10-CM

## 2022-03-03 DIAGNOSIS — Z981 Arthrodesis status: Secondary | ICD-10-CM

## 2022-03-03 NOTE — Patient Instructions (Addendum)
Medication Instructions:  No changes  If you need a refill on your cardiac medications before your next appointment, please call your pharmacy.   Lab work: No new labs needed  Testing/Procedures: Your physician has requested that you have an echocardiogram. Echocardiography is a painless test that uses sound waves to create images of your heart. It provides your doctor with information about the size and shape of your heart and how well your heart's chambers and valves are working. This procedure takes approximately one hour. There are no restrictions for this procedure. Please do NOT wear cologne, perfume, aftershave, or lotions (deodorant is allowed). Please arrive 15 minutes prior to your appointment time.[place]  Your caregiver has ordered a Stress Test with nuclear imaging. The purpose of this test is to evaluate the blood supply to your heart muscle. This procedure is referred to as a "Non-Invasive Stress Test." This is because other than having an IV started in your vein, nothing is inserted or "invades" your body. Cardiac stress tests are done to find areas of poor blood flow to the heart by determining the extent of coronary artery disease (CAD). Some patients exercise on a treadmill, which naturally increases the blood flow to your heart, while others who are  unable to walk on a treadmill due to physical limitations have a pharmacologic/chemical stress agent called Lexiscan . This medicine will mimic walking on a treadmill by temporarily increasing your coronary blood flow.   Please note: these test may take anywhere between 2-4 hours to complete  PLEASE REPORT TO [place] AT THE FIRST DESK WILL DIRECT YOU WHERE TO GO  Date of Procedure:_____________________________________  Arrival Time for Procedure:______________________________  Instructions regarding medication:   __X__ : Hold diabetes medications morning of procedure                Only take 1/2 the dose of Levemir the evening prior to testing   __X_:  Hold metoprolol the night before procedure and morning of procedure   PLEASE NOTIFY THE OFFICE AT LEAST 24 HOURS IN ADVANCE IF YOU ARE UNABLE TO KEEP YOUR APPOINTMENT.  [phone] AND  PLEASE NOTIFY NUCLEAR MEDICINE AT [facility] AT LEAST 24 HOURS IN ADVANCE IF YOU ARE UNABLE TO KEEP YOUR APPOINTMENT. [phone]  How to prepare for your Myoview test:  Do not eat or drink after midnight No caffeine for 24 hours prior to test No smoking 24 hours prior to test. Your medication may be taken with water.  If your doctor stopped a medication because of this test, do not take that medication. Ladies, please do not wear dresses.  Skirts or pants are appropriate. Please wear a short sleeve shirt. No perfume, cologne or lotion. Wear comfortable walking shoes. No heels!   Follow-Up: At [facility], you and your health needs are our priority.  As part of our continuing mission to provide you with exceptional heart care, we have created designated Provider Care Teams.  These Care Teams include your primary Cardiologist (physician) and Advanced Practice Providers (APPs -  Physician Assistants and Nurse Practitioners) who all work together to provide you with the care you need, when you need it.  You will need a follow up appointment in 2 months  Providers on your designated Care Team:   Murray Hodgkins, NP Christell Faith, PA-C Cad[place] Kathlen Mody, [place]  COVID-19 Vaccine Information can be found at: [email or web address] For questions related to vaccine distribution or appointments, please email [email or web address] or call [phone].

## 2022-03-03 NOTE — Progress Notes (Signed)
Telephone Visit- Progress Note: Referring Physician:  Albina Billet, MD [name]   [place],  [place] [address]  Primary Physician:  Albina Billet, MD  This visit was performed via telephone.  Patient location: home Provider location: office  I spent a total of 20 minutes non-face-to-face activities for this visit on the date of this encounter including review of current clinical condition and response to treatment.   Chief Complaint:  review lumbar MRI scan  History of Present Illness: Christopher Escobar is a 66 y.o. male was last seen by me on 02/14/22 for constant LBP with some right buttock pain. No pain into the posterior leg. He has intermittent numbness in right anterior thigh. No left leg pain.    History of lumbar fusion L3-L5 in 1989.     He has known mild DDD/spondylosis with previous L3-L5 fusion, likely has adjacent segment disease. He continued with significant tenderness over right SI joint at his last visit.   He was to stop PT at last visit (no relief with 10 visits) and phone visit is scheduled to review his lumbar MRI.   His primary complaint continues to be constant pain in right buttock that is worse with sitting. No right posterior leg pain. Some intermittent numbness right anterior thigh. He does okay with walking, but does better with grocery cart.   History of HTN, DM, and OSA. He has neuropathy in both feet.    Conservative measures:  Physical therapy: at [facility] 10 visits from 01/09/22 to 02/11/22.  Multimodal medical therapy including regular antiinflammatories: percocet 7.5, medrol dose pack, flexeril, lyrica Injections: No recent epidural steroid injections   Past Surgery: Instrumented fusion L3-L5 in 1989   Exam: No exam done as this was a telephone encounter.     Imaging: Lumbar MRI scan dated 02/27/22:  FINDINGS: Segmentation:  Standard.   Alignment:  Trace retrolisthesis of L1 on L2 and L2 on L3.   Vertebrae: No fracture,  suspicious marrow lesion, or significant marrow edema. Prior L3-L5 posterior fusion. Ankylosis across the L4-5 disc space and across the peripheral aspects of the L3-4 disc space.   Conus medullaris and cauda equina: Conus extends to the L1 level. Conus and cauda equina appear normal.   Paraspinal and other soft tissues: Postoperative changes in the posterior lumbar soft tissues. No fluid collection. Partial imaging of a known large right renal cyst for which no follow-up imaging is recommended.   Disc levels:   Disc desiccation throughout the lumbar spine with exception of L5-S1. Mild-to-moderate disc space narrowing from L2-3 through L4-5.   T12-L1: At most minimal disc bulging without stenosis.   L1-2: Mild disc bulging and moderate facet hypertrophy result in mild left lateral recess stenosis without significant spinal stenosis or neural foraminal stenosis.   L2-3: Prominent circumferential disc bulging and severe facet and ligamentum flavum hypertrophy result in severe spinal stenosis and mild bilateral neural foraminal stenosis. Potential bilateral L3 nerve root impingement.   L3-4: Prior fusion without residual compressive stenosis.   L4-5: Prior fusion without residual compressive stenosis.   L5-S1: Left foraminal endplate spurring and facet arthrosis result in borderline to mild left neural foraminal stenosis. Patent spinal canal and right neural foramen.   IMPRESSION: 1. L3-L5 fusion without significant residual stenosis. 2. Adjacent segment disease at L2-3 with severe spinal stenosis.     Electronically Signed   By: Logan Bores M.D.   On: 02/27/2022 18:04    I have personally reviewed  the images and agree with the above interpretation.  Assessment and Plan: Christopher Escobar is a pleasant 66 y.o. male with a primary complaint of constant pain in right buttock that is worse with sitting. No right posterior leg pain. Some intermittent numbness right anterior  thigh. He does okay with walking, but does better with grocery cart.   History of lumbar fusion L3-L5 in 1989.     He has known mild DDD/spondylosis with previous L3-L5 fusion, likely has adjacent segment disease. He has severe central stenosis at L2-L3 with mild bilateral foraminal stenosis.   With buttock pain being his primary complaint and tenderness over the SI joint at last visit, he likely has some SI joint mediated pain. Pain in legs with walking is likely from central stenosis L2-L3 and intermittent right thigh pain likely from foraminal stenosis L2-L3.   Treatment options discussed with patient and following plan made:   - We discussed options including possible SI joint injection and/or further surgery options (extending fusion up to L2-L3).  - He has failed PT.  - Referral to [facility] in [place]) to consider right SI joint injection. May also consider bilateral L2-L3 TF ESI at some point.  - He would like to avoid surgery if possible.  - He will follow up with me in 6-8 weeks for recheck.   Geronimo Boot PA-C Neurosurgery

## 2022-03-13 ENCOUNTER — Encounter
Admission: RE | Admit: 2022-03-13 | Discharge: 2022-03-13 | Disposition: A | Discharge status: Home / Self Care | Payer: Medicare HMO | Source: Ambulatory Visit | Attending: Cardiovascular Disease | Admitting: Cardiovascular Disease

## 2022-03-13 DIAGNOSIS — I25118 Atherosclerotic heart disease of native coronary artery with other forms of angina pectoris: Secondary | ICD-10-CM | POA: Diagnosis not present

## 2022-03-13 DIAGNOSIS — R0602 Shortness of breath: Secondary | ICD-10-CM | POA: Insufficient documentation

## 2022-03-13 DIAGNOSIS — I209 Angina pectoris, unspecified: Secondary | ICD-10-CM

## 2022-03-13 LAB — NM MYOCAR MULTI W/SPECT W/WALL MOTION / EF
Estimated workload: 1
Exercise duration (min): 0 min
Exercise duration (sec): 0 s
LV dias vol: 162 mL (ref 62–150)
LV sys vol: 113 mL
MPHR: 125 {beats}/min
Nuc Stress EF: 30 %
Peak HR: 102 {beats}/min
Percent HR: 67 %
Rest HR: 91 {beats}/min
Rest Nuclear Isotope Dose: 10.6 mCi
SDS: 1
SRS: 13
SSS: 9
ST Depression (mm): 0 mm
Stress Nuclear Isotope Dose: 32.5 mCi
TID: 1.03

## 2022-03-13 MED ORDER — REGADENOSON 0.4 MG/5ML IV SOLN
0.4000 mg | Freq: Once | INTRAVENOUS | Status: AC
  Administered 2022-03-13: 0.4 mg via INTRAVENOUS

## 2022-03-13 MED ORDER — TECHNETIUM TC 99M TETROFOSMIN IV KIT
32.5000 | PACK | Freq: Once | INTRAVENOUS | Status: AC | PRN
  Administered 2022-03-13: 32.5 via INTRAVENOUS

## 2022-03-13 MED ORDER — TECHNETIUM TC 99M TETROFOSMIN IV KIT
10.6200 | PACK | Freq: Once | INTRAVENOUS | Status: AC | PRN
  Administered 2022-03-13: 10.62 via INTRAVENOUS

## 2022-04-24 ENCOUNTER — Ambulatory Visit: Payer: Medicare PPO | Attending: Cardiovascular Disease

## 2022-04-24 DIAGNOSIS — R0602 Shortness of breath: Secondary | ICD-10-CM | POA: Diagnosis not present

## 2022-04-24 LAB — ECHOCARDIOGRAM COMPLETE
AR max vel: 2.62 cm2
AV Area VTI: 2.97 cm2
AV Area mean vel: 2.84 cm2
AV Mean grad: 3 mmHg
AV Peak grad: 6.4 mmHg
Ao pk vel: 1.26 m/s
Area-P 1/2: 7.9 cm2
S' Lateral: 4.6 cm
Single Plane A4C EF: 45.9 %

## 2022-04-24 MED ORDER — PERFLUTREN LIPID MICROSPHERE
1.0000 mL | INTRAVENOUS | Status: AC | PRN
  Administered 2022-04-24: 2 mL via INTRAVENOUS

## 2022-05-07 ENCOUNTER — Encounter: Payer: Self-pay | Admitting: Physician Assistant

## 2022-05-07 NOTE — Progress Notes (Deleted)
Cardiology Office Note    Date:  05/07/2022   ID:  Christopher, Escobar [birth year], MRN [id number]  PCP:  Albina Billet, MD  Cardiologist:  Ida Rogue, MD  Electrophysiologist:  None   Chief Complaint: Follow-up  History of Present Illness:   Christopher Escobar is a 67 y.o. male with history of CAD noted on CT imaging, HFrEF of uncertain chronicity and etiology, PAD involving the aorta and iliac arteries, diabetes with peripheral neuropathy complicated by diabetic ulcers and prior amputations, HTN, HLD, prior tobacco use quitting in 2008, lumbar fusion, and OSA who presents for follow-up of his cardiomyopathy and coronary artery disease.  He was evaluated by Dr. Rockey Situ as a new patient on 03/03/2022 for exertional dyspnea.  At that time he reported shortness of breath for months to years and was limited in functional status secondary to toe amputations bilaterally.  With regards to his PAD, ABI normal in 06/2021.  CT of the abdomen in 10/2017 showed aortic atherosclerosis extending into the iliac arteries.  Lexiscan MPI in 02/2022 showed no significant ischemia with a small fixed region of apical thinning felt to likely be attenuation artifact with mild hypokinesis of the inferolateral wall with an EF estimated at 37% with GI uptake artifact noted.  CT attenuation corrected images showed three-vessel coronary artery calcification and mild aortic atherosclerosis.  Overall, this was a moderate risk candidate given reduced EF.  Subsequent echo in 03/2022 demonstrated an EF of 45 to 50%, global hypokinesis, grade 2 diastolic dysfunction, normal RV systolic function and ventricular cavity size, moderately dilated left atrium, mild mitral regurgitation, and an estimated right atrial pressure of 3 mmHg.  No changes were made.  ***   Labs independently reviewed: 01/2021 - Hgb 14.5, PLT 234, potassium 3.7, BUN 18, serum creatinine 1.07, A1c 9.1, albumin 3.4, AST/ALT not elevated 05/2017 - TSH  normal  Past Medical History:  Diagnosis Date   CAD (coronary artery disease)    Diabetes mellitus with complication ([place]) 9211   HFrEF (heart failure with reduced ejection fraction) ([place])    Hypertension 2008   PAD (peripheral artery disease) ([place])    Personal history of colonic polyps    TA polyp   Sleep apnea    not using cpap    Past Surgical History:  Procedure Laterality Date   AMPUTATION Right 09/15/2019   Procedure: AMPUTATION RIGHT FOOT 2ND DIGIT;  Surgeon: Felipa Furnace, DPM;  Location: [facility];  Service: Podiatry;  Laterality: Right;   AMPUTATION Left 02/08/2021   Procedure: AMPUTATION RAY-Partial 5th Ray;  Surgeon: [name], DPM;  Location: [facility];  Service: Podiatry;  Laterality: Left;   BACK SURGERY  1989   COLONOSCOPY  2013   COLONOSCOPY WITH PROPOFOL N/A 10/28/2016   Procedure: COLONOSCOPY WITH PROPOFOL;  Surgeon: Robert Bellow, MD;  Location: [facility];  Service: Endoscopy;  Laterality: N/A;   SHOULDER ARTHROSCOPY WITH SUBACROMIAL DECOMPRESSION Left 04/02/2017   Procedure: SHOULDER ARTHROSCOPY WITH DECOMPRESSION, DEBRIDEMENT, SLAP REPAIR,;  Surgeon: Corky Mull, MD;  Location: [facility];  Service: Orthopedics;  Laterality: Left;   SHOULDER SURGERY Bilateral 2011,2012    Current Medications: No outpatient medications have been marked as taking for the 05/08/22 encounter (Appointment) with Rise Mu, PA-C.    Allergies:   Patient has no known allergies.   Social History   Socioeconomic History   Marital status: Married    Spouse name: Not on file   Number of children: Not on  file   Years of education: Not on file   Highest education level: Not on file  Occupational History   Not on file  Tobacco Use   Smoking status: Former    Packs/day: 1.00    Years: 30.00    Total pack years: 30.00    Types: Cigarettes    Quit date: 02/03/2007    Years since quitting: 15.2   Smokeless tobacco: Never  Vaping Use   Vaping Use: Never used   Substance and Sexual Activity   Alcohol use: Yes    Comment: seldom   Drug use: No   Sexual activity: Not on file  Other Topics Concern   Not on file  Social History Narrative   Not on file   Social Determinants of Health   Financial Resource Strain: Not on file  Food Insecurity: Not on file  Transportation Needs: Not on file  Physical Activity: Not on file  Stress: Not on file  Social Connections: Not on file     Family History:  The patient's family history includes Heart attack (age of onset: 22) in his father; Heart attack (age of onset: 68) in his brother; Heart attack (age of onset: 19) in his brother.  ROS:   12-point review of systems is negative unless otherwise noted in the HPI.   EKGs/Labs/Other Studies Reviewed:    Studies reviewed were summarized above. The additional studies were reviewed today:  2D echo 04/24/2022: 1. Left ventricular ejection fraction, by estimation, is 45 to 50%. The  left ventricle has mildly decreased function. The left ventricle  demonstrates global hypokinesis. Left ventricular diastolic parameters are  consistent with Grade II diastolic  dysfunction (pseudonormalization).   2. Right ventricular systolic function is normal. The right ventricular  size is normal. Tricuspid regurgitation signal is inadequate for assessing  PA pressure.   3. Left atrial size was moderately dilated.   4. The mitral valve is normal in structure. Mild mitral valve  regurgitation. No evidence of mitral stenosis.   5. The aortic valve is normal in structure. Aortic valve regurgitation is  mild. No aortic stenosis is present.   6. The inferior vena cava is normal in size with greater than 50%  respiratory variability, suggesting right atrial pressure of 3 mmHg.  __________  Carlton Adam MPI 03/13/2022: Pharmacological myocardial perfusion imaging study with no significant  ischemia Small fixed region apical thinning, likely attenuation artifact Mild  hypokinesis of the inferolateral wall, EF estimated at 37%, GI uptake artifact noted No EKG changes concerning for ischemia at peak stress or in recovery.   CT attenuation correction images with three-vessel coronary calcification, mild aortic atherosclerosis Moderate risk scan given moderately reduced ejection fraction, echocardiogram pending   EKG:  EKG is ordered today.  The EKG ordered today demonstrates ***  Recent Labs: No results found for requested labs within last 365 days.  Recent Lipid Panel No results found for: "CHOL", "TRIG", "HDL", "CHOLHDL", "VLDL", "LDLCALC", "LDLDIRECT"  PHYSICAL EXAM:    VS:  There were no vitals taken for this visit.  BMI: There is no height or weight on file to calculate BMI.  Physical Exam  Wt Readings from Last 3 Encounters:  03/03/22 247 lb 8 oz (112.3 kg)  02/14/22 247 lb 9.6 oz (112.3 kg)  01/03/22 242 lb 9.6 oz (110 kg)     ASSESSMENT & PLAN:   HFrEF:  CAD involving the native coronary arteries with ***:  PAD:  DM2 with complications:  HTN: Blood pressure  HLD:   {Are you ordering a CV Procedure (e.g. stress test, cath, DCCV, TEE, etc)?   Press F2        :[id number]}     Disposition: F/u with Dr. Marland Kitchen or an APP in ***.   Medication Adjustments/Labs and Tests Ordered: Current medicines are reviewed at length with the patient today.  Concerns regarding medicines are outlined above. Medication changes, Labs and Tests ordered today are summarized above and listed in the Patient Instructions accessible in Encounters.   Signed, Christell Faith, PA-C 05/07/2022 7:19 AM     [facility] [address] [place], [place] [address] [phone]

## 2022-05-08 ENCOUNTER — Ambulatory Visit: Payer: Medicare PPO | Attending: Physician Assistant | Admitting: Physician Assistant

## 2022-05-09 ENCOUNTER — Encounter: Payer: Self-pay | Admitting: Physician Assistant

## 2022-05-09 ENCOUNTER — Telehealth: Payer: Self-pay | Admitting: Cardiovascular Disease

## 2022-05-09 NOTE — Progress Notes (Deleted)
Cardiology Office Note    Date:  05/09/2022   ID:  Aadhi, Smyser [birth year], MRN [id number]  PCP:  Albina Billet, MD  Cardiologist:  Ida Rogue, MD  Electrophysiologist:  None   Chief Complaint: Follow up  History of Present Illness:   Christopher Escobar is a 67 y.o. male with history of CAD noted on CT imaging, HFrEF of uncertain chronicity and etiology, PAD involving the aorta and iliac arteries, diabetes with peripheral neuropathy complicated by diabetic ulcers and prior amputations, HTN, HLD, prior tobacco use quitting in 2008, lumbar fusion, and OSA who presents for follow-up of his cardiomyopathy and coronary artery disease.  He was evaluated by Dr. Rockey Situ as a new patient on 03/03/2022 for exertional dyspnea.  At that time he reported shortness of breath for months to years and was limited in functional status secondary to toe amputations bilaterally.  With regards to his PAD, ABI normal in 06/2021.  CT of the abdomen in 10/2017 showed aortic atherosclerosis extending into the iliac arteries.  Lexiscan MPI in 02/2022 showed no significant ischemia with a small fixed region of apical thinning felt to likely be attenuation artifact with mild hypokinesis of the inferolateral wall with an EF estimated at 37% with GI uptake artifact noted.  CT attenuation corrected images showed three-vessel coronary artery calcification and mild aortic atherosclerosis.  Overall, this was a moderate risk candidate given reduced EF.  Subsequent echo in 03/2022 demonstrated an EF of 45 to 50%, global hypokinesis, grade 2 diastolic dysfunction, normal RV systolic function and ventricular cavity size, moderately dilated left atrium, mild mitral regurgitation, and an estimated right atrial pressure of 3 mmHg.  No changes were made.  ***   Labs independently reviewed: 01/2021 - Hgb 14.5, PLT 234, potassium 3.7, BUN 18, serum creatinine 1.07, A1c 9.1, albumin 3.4, AST/ALT not elevated 05/2017 - TSH  normal   Past Medical History:  Diagnosis Date   CAD (coronary artery disease)    Diabetes mellitus with complication ([place]) [id number]   HFrEF (heart failure with reduced ejection fraction) ([place])    Hypertension 2008   PAD (peripheral artery disease) ([place])    Personal history of colonic polyps    TA polyp   Sleep apnea    not using cpap    Past Surgical History:  Procedure Laterality Date   AMPUTATION Right 09/15/2019   Procedure: AMPUTATION RIGHT FOOT 2ND DIGIT;  Surgeon: Felipa Furnace, DPM;  Location: [facility];  Service: Podiatry;  Laterality: Right;   AMPUTATION Left 02/08/2021   Procedure: AMPUTATION RAY-Partial 5th Ray;  Surgeon: [name], DPM;  Location: [facility];  Service: Podiatry;  Laterality: Left;   BACK SURGERY  1989   COLONOSCOPY  2013   COLONOSCOPY WITH PROPOFOL N/A 10/28/2016   Procedure: COLONOSCOPY WITH PROPOFOL;  Surgeon: Robert Bellow, MD;  Location: [facility];  Service: Endoscopy;  Laterality: N/A;   SHOULDER ARTHROSCOPY WITH SUBACROMIAL DECOMPRESSION Left 04/02/2017   Procedure: SHOULDER ARTHROSCOPY WITH DECOMPRESSION, DEBRIDEMENT, SLAP REPAIR,;  Surgeon: Corky Mull, MD;  Location: [facility];  Service: Orthopedics;  Laterality: Left;   SHOULDER SURGERY Bilateral 2011,2012    Current Medications: No outpatient medications have been marked as taking for the 05/12/22 encounter (Appointment) with Rise Mu, PA-C.    Allergies:   Patient has no known allergies.   Social History   Socioeconomic History   Marital status: Married    Spouse name: Not on file   Number of children:  Not on file   Years of education: Not on file   Highest education level: Not on file  Occupational History   Not on file  Tobacco Use   Smoking status: Former    Packs/day: 1.00    Years: 30.00    Total pack years: 30.00    Types: Cigarettes    Quit date: 02/03/2007    Years since quitting: 15.2   Smokeless tobacco: Never  Vaping Use   Vaping Use: Never used   Substance and Sexual Activity   Alcohol use: Yes    Comment: seldom   Drug use: No   Sexual activity: Not on file  Other Topics Concern   Not on file  Social History Narrative   Not on file   Social Determinants of Health   Financial Resource Strain: Not on file  Food Insecurity: Not on file  Transportation Needs: Not on file  Physical Activity: Not on file  Stress: Not on file  Social Connections: Not on file     Family History:  The patient's family history includes Heart attack (age of onset: 21) in his father; Heart attack (age of onset: 38) in his brother; Heart attack (age of onset: 65) in his brother.  ROS:   12-point review of systems is negative unless otherwise noted in the HPI.   EKGs/Labs/Other Studies Reviewed:    Studies reviewed were summarized above. The additional studies were reviewed today:  2D echo 04/24/2022: 1. Left ventricular ejection fraction, by estimation, is 45 to 50%. The  left ventricle has mildly decreased function. The left ventricle  demonstrates global hypokinesis. Left ventricular diastolic parameters are  consistent with Grade II diastolic  dysfunction (pseudonormalization).   2. Right ventricular systolic function is normal. The right ventricular  size is normal. Tricuspid regurgitation signal is inadequate for assessing  PA pressure.   3. Left atrial size was moderately dilated.   4. The mitral valve is normal in structure. Mild mitral valve  regurgitation. No evidence of mitral stenosis.   5. The aortic valve is normal in structure. Aortic valve regurgitation is  mild. No aortic stenosis is present.   6. The inferior vena cava is normal in size with greater than 50%  respiratory variability, suggesting right atrial pressure of 3 mmHg.  __________  Carlton Adam MPI 03/13/2022: Pharmacological myocardial perfusion imaging study with no significant  ischemia Small fixed region apical thinning, likely attenuation artifact Mild  hypokinesis of the inferolateral wall, EF estimated at 37%, GI uptake artifact noted No EKG changes concerning for ischemia at peak stress or in recovery.   CT attenuation correction images with three-vessel coronary calcification, mild aortic atherosclerosis Moderate risk scan given moderately reduced ejection fraction, echocardiogram pending   EKG:  EKG is ordered today.  The EKG ordered today demonstrates ***  Recent Labs: No results found for requested labs within last 365 days.  Recent Lipid Panel No results found for: "CHOL", "TRIG", "HDL", "CHOLHDL", "VLDL", "LDLCALC", "LDLDIRECT"  PHYSICAL EXAM:    VS:  There were no vitals taken for this visit.  BMI: There is no height or weight on file to calculate BMI.  Physical Exam  Wt Readings from Last 3 Encounters:  03/03/22 247 lb 8 oz (112.3 kg)  02/14/22 247 lb 9.6 oz (112.3 kg)  01/03/22 242 lb 9.6 oz (110 kg)     ASSESSMENT & PLAN:   HFrEF:  CAD involving the native coronary arteries with ***:  PAD:  DM2 with complications:  HTN: Blood  pressure  HLD:   {Are you ordering a CV Procedure (e.g. stress test, cath, DCCV, TEE, etc)?   Press F2        :[id number]     Disposition: F/u with Dr. Rockey Situ or an APP in ***.   Medication Adjustments/Labs and Tests Ordered: Current medicines are reviewed at length with the patient today.  Concerns regarding medicines are outlined above. Medication changes, Labs and Tests ordered today are summarized above and listed in the Patient Instructions accessible in Encounters.   Signed, Christell Faith, PA-C 05/09/2022 12:37 PM     [facility] [address] [place], [place] [address] [phone]

## 2022-05-09 NOTE — Telephone Encounter (Signed)
Spoke with patient and he stated that he was experiencing some shortness of breath on exertion. Patient denies dyspnea at rest, angina, pain radiating in arms, shoulders neck, back, and jaw. Patient denies dizziness, lightheadedness, clammy skin or sweating. Patient denies indigestion, nausea or vomiting and palpitations.Informed patient that he if starts to experience those signs and symptoms to present to the nearest emergency department. Patient is scheduled for follow-up visit 05/12/22.

## 2022-05-09 NOTE — Telephone Encounter (Signed)
Pt c/o Shortness Of Breath: STAT if SOB developed within the last 24 hours or pt is noticeably SOB on the phone  1. Are you currently SOB (can you hear that pt is SOB on the phone)? Yes  2. How long have you been experiencing SOB? weeks  3. Are you SOB when sitting or when up moving around? Moving around  4. Are you currently experiencing any other symptoms? Anxiety  **missed appt yesterday rescheduled for Monday

## 2022-05-12 ENCOUNTER — Ambulatory Visit: Payer: Medicare PPO | Admitting: Physician Assistant

## 2022-05-13 ENCOUNTER — Ambulatory Visit: Payer: Medicare PPO | Attending: Physician Assistant | Admitting: Physician Assistant

## 2022-05-13 ENCOUNTER — Other Ambulatory Visit
Admission: RE | Admit: 2022-05-13 | Discharge: 2022-05-13 | Disposition: A | Discharge status: Home / Self Care | Payer: Medicare PPO | Source: Ambulatory Visit | Attending: Physician Assistant | Admitting: Physician Assistant

## 2022-05-13 ENCOUNTER — Telehealth: Payer: Self-pay

## 2022-05-13 ENCOUNTER — Ambulatory Visit
Admission: RE | Admit: 2022-05-13 | Discharge: 2022-05-13 | Disposition: A | Discharge status: Home / Self Care | Payer: Medicare PPO | Source: Ambulatory Visit | Attending: Physician Assistant | Admitting: Physician Assistant

## 2022-05-13 ENCOUNTER — Encounter: Payer: Self-pay | Admitting: Physician Assistant

## 2022-05-13 VITALS — BP 157/100 | HR 98 | Ht 71.0 in | Wt 253.6 lb

## 2022-05-13 DIAGNOSIS — R0609 Other forms of dyspnea: Secondary | ICD-10-CM | POA: Diagnosis present

## 2022-05-13 DIAGNOSIS — R0602 Shortness of breath: Secondary | ICD-10-CM

## 2022-05-13 DIAGNOSIS — E785 Hyperlipidemia, unspecified: Secondary | ICD-10-CM

## 2022-05-13 DIAGNOSIS — E118 Type 2 diabetes mellitus with unspecified complications: Secondary | ICD-10-CM | POA: Diagnosis not present

## 2022-05-13 DIAGNOSIS — I10 Essential (primary) hypertension: Secondary | ICD-10-CM

## 2022-05-13 DIAGNOSIS — I5020 Unspecified systolic (congestive) heart failure: Secondary | ICD-10-CM

## 2022-05-13 DIAGNOSIS — I739 Peripheral vascular disease, unspecified: Secondary | ICD-10-CM | POA: Diagnosis not present

## 2022-05-13 DIAGNOSIS — I25118 Atherosclerotic heart disease of native coronary artery with other forms of angina pectoris: Secondary | ICD-10-CM | POA: Diagnosis not present

## 2022-05-13 DIAGNOSIS — Z794 Long term (current) use of insulin: Secondary | ICD-10-CM

## 2022-05-13 LAB — COMPREHENSIVE METABOLIC PANEL
ALT: 23 U/L (ref 0–44)
AST: 24 U/L (ref 15–41)
Albumin: 3.5 g/dL (ref 3.5–5.0)
Alkaline Phosphatase: 73 U/L (ref 38–126)
Anion gap: 11 (ref 5–15)
BUN: 18 mg/dL (ref 8–23)
CO2: 25 mmol/L (ref 22–32)
Calcium: 8.8 mg/dL — ABNORMAL LOW (ref 8.9–10.3)
Chloride: 102 mmol/L (ref 98–111)
Creatinine, Ser: 0.95 mg/dL (ref 0.61–1.24)
GFR, Estimated: >60 mL/min (ref >60)
Glucose, Bld: 159 mg/dL — ABNORMAL HIGH (ref 70–99)
Potassium: 3.1 mmol/L — ABNORMAL LOW (ref 3.5–5.1)
Sodium: 138 mmol/L (ref 135–145)
Total Bilirubin: 1.1 mg/dL (ref 0.3–1.2)
Total Protein: 7.2 g/dL (ref 6.5–8.1)

## 2022-05-13 LAB — LIPID PANEL
Cholesterol: 104 mg/dL (ref 0–200)
HDL: 34 mg/dL — ABNORMAL LOW (ref >40)
LDL Cholesterol: 54 mg/dL (ref 0–99)
Total CHOL/HDL Ratio: 3.1 RATIO
Triglycerides: 78 mg/dL (ref <150)
VLDL: 16 mg/dL (ref 0–40)

## 2022-05-13 LAB — BRAIN NATRIURETIC PEPTIDE: B Natriuretic Peptide: 855.5 pg/mL — ABNORMAL HIGH (ref 0.0–100.0)

## 2022-05-13 LAB — CBC
HCT: 49.2 % (ref 39.0–52.0)
Hemoglobin: 16.1 g/dL (ref 13.0–17.0)
MCH: 28.9 pg (ref 26.0–34.0)
MCHC: 32.7 g/dL (ref 30.0–36.0)
MCV: 88.3 fL (ref 80.0–100.0)
Platelets: 222 10*3/uL (ref 150–400)
RBC: 5.57 MIL/uL (ref 4.22–5.81)
RDW: 16.6 % — ABNORMAL HIGH (ref 11.5–15.5)
WBC: 12.8 10*3/uL — ABNORMAL HIGH (ref 4.0–10.5)
nRBC: 0 % (ref 0.0–0.2)

## 2022-05-13 LAB — LDL CHOLESTEROL, DIRECT: Direct LDL: 51 mg/dL (ref 0–99)

## 2022-05-13 MED ORDER — ASPIRIN 81 MG PO TBEC: 81.0000 mg | DELAYED_RELEASE_TABLET | Freq: Every day | ORAL | 1 refills | Status: AC

## 2022-05-13 MED ORDER — FUROSEMIDE 20 MG PO TABS: ORAL_TABLET | ORAL | 1 refills | Status: DC

## 2022-05-13 MED ORDER — POTASSIUM CHLORIDE CRYS ER 20 MEQ PO TBCR: EXTENDED_RELEASE_TABLET | ORAL | 1 refills | Status: DC

## 2022-05-13 NOTE — Patient Instructions (Addendum)
Medication Instructions:  START aspirin 81 mg by mouth daily.  *If you need a refill on your cardiac medications before your next appointment, please call your pharmacy*  Lab Work: STAT CMP, BNP, CBC, lipid , and direct LDL panel - Please go to the [facility]. You will check in at the front desk to the right as you walk into the atrium. Valet Parking is offered if needed. - No appointment needed. You may go any day between 7 am and 6 pm.  If you have labs (blood work) drawn today and your tests are completely normal, you will receive your results only by: [place] (if you have MyChart) OR A paper copy in the mail If you have any lab test that is abnormal or we need to change your treatment, we will call you to review the results.   Testing/Procedures: A chest x-ray takes a picture of the organs and structures inside the chest, including the heart, lungs, and blood vessels. This test can show several things, including, whether the heart is enlarges; whether fluid is building up in the lungs; and whether pacemaker / defibrillator leads are still in place.    [facility] [place], [place] [address] Dept: [phone] Loc: [phone]   Cardiac/Peripheral Catheterization   You are scheduled for a Cardiac Catheterization on Monday, February 19 with Dr. Kathlyn [place].  1. Arrive at the [facility] entrance at 8:30 AM, one hour prior to your procedure. Free valet service is available.  After entering the [facility] please check-in at the registration desk (1st desk on your right) to receive your armband. After receiving your armband someone will escort you to the cardiac cath/special procedures waiting area. The support person will be asked to wait in the waiting room.  It is OK to have someone drop you off and come back when you are ready to be discharged.        Special note: Every effort is made to have your procedure done on time.  Please understand that emergencies sometimes delay scheduled procedures.   . 2. Diet: Do not eat solid foods after midnight.  You may have clear liquids until 5 AM the day of the procedure.  3. Labs: You will need to have blood drawn today as instructed above  4. Medication instructions in preparation for your procedure:   Contrast Allergy: No  Do NOT take Jardiance the day before or morning of your procedure. Do NOT take Ozempic until instructed after procedure. Take half your dose (30 units) of Levemir on the night before the procedure. Do not take Novolog on the morning of the procedure.  On the morning of your procedure, take Aspirin 81 mg and any morning medicines NOT listed above.  You may use sips of water.  5. Plan to go home the same day, you will only stay overnight if medically necessary. 6. You MUST have a responsible adult to drive you home. 7. An adult MUST be with you the first 24 hours after you arrive home. 8. Bring a current list of your medications, and the last time and date medication taken. 9. Bring ID and current insurance cards. 10.Please wear clothes that are easy to get on and off and wear slip-on shoes.  Thank you for allowing [place] to care for you!   -- [facility]  Follow-Up: At [facility], you and your health needs are our priority.  As part of our  continuing mission to provide you with exceptional heart care, we have created designated Provider Care Teams.  These Care Teams include your primary Cardiologist (physician) and Advanced Practice Providers (APPs -  Physician Assistants and Nurse Practitioners) who all work together to provide you with the care you need, when you need it.  We recommend signing up for the patient portal called "MyChart".  Sign up information is provided on this After Visit Summary.  MyChart is used to connect with patients for Virtual Visits (Telemedicine).  Patients are able to view  lab/test results, encounter notes, upcoming appointments, etc.  Non-urgent messages can be sent to your provider as well.   To learn more about what you can do with MyChart, go to [email or web address].    Your next appointment:   2 week(s) after cardiac catheterization  Provider:   You may see Ida Rogue, MD or one of the following Advanced Practice Providers on your designated Care Team:   Murray Hodgkins, NP Christell Faith, PA-C Cadence [place]len Mody, PA-C Gerrie Nordmann, NP

## 2022-05-13 NOTE — Progress Notes (Signed)
Cardiology Office Note    Date:  05/13/2022   ID:  Christopher Escobar, DOB 07-20-1955, MRN [id number]  PCP:  Christopher Billet, MD  Cardiologist:  Christopher Rogue, MD  Electrophysiologist:  None   Chief Complaint: Follow-up  History of Present Illness:   Christopher Escobar is a 67 y.o. male with history of CAD noted on CT imaging, HFrEF, PAD involving the aorta and iliac arteries, diabetes with peripheral neuropathy complicated by diabetic ulcers and prior amputations, HTN, HLD, prior tobacco use quitting in 2008, lumbar fusion, and OSA who presents for follow-up of his cardiomyopathy and coronary artery disease.  He was evaluated by Dr. Rockey Escobar as a new patient on 03/03/2022 for exertional dyspnea.  At that time he reported shortness of breath for months to years and was limited in functional status secondary to toe amputations bilaterally.  With regards to his PAD, ABI was normal in 06/2021.  CT of the abdomen in 10/2017 showed aortic atherosclerosis extending into the iliac arteries.  Lexiscan MPI in 02/2022 showed no significant ischemia with a small fixed region of apical thinning felt to likely be attenuation artifact with mild hypokinesis of the inferolateral wall with an EF estimated at 37% with GI uptake artifact noted.  CT attenuation corrected images showed three-vessel coronary artery calcification and mild aortic atherosclerosis.  Overall, this was a moderate risk candidate given reduced EF.  Subsequent echo in 03/2022 demonstrated an EF of 45 to 50%, global hypokinesis, grade 2 diastolic dysfunction, normal RV systolic function and ventricular cavity size, moderately dilated left atrium, mild mitral regurgitation, and an estimated right atrial pressure of 3 mmHg.  No changes were made.  He comes in today indicating he is not feeling well.  He notes a longstanding history of exertional shortness of breath.  He indicates this has been going on "a long time."  However, since he was last seen in  December, his exertional shortness of breath is getting worse.  He is without symptoms of frank angina.  He was unable to ambulate in from the parking lot to the entryway to the medical arts building today without having to stop twice.  He also notes some mild lower extremity swelling.  Last evening, he was unable to lay flat to sleep.  This was the first time this has occurred.  His weight is up 6 pounds today when compared to his visit in our office in 02/2022.  He reports eating salty food and eats out at restaurants 2-3 times per week.  No symptoms of dizziness, presyncope, or syncope.   Labs independently reviewed: 01/2021 - Hgb 14.5, PLT 234, potassium 3.7, BUN 18, serum creatinine 1.07, A1c 9.1, albumin 3.4, AST/ALT not elevated 05/2017 - TSH normal   Past Medical History:  Diagnosis Date   CAD (coronary artery disease)    Diabetes mellitus with complication ([place]) [id number]   HFrEF (heart failure with reduced ejection fraction) ([place])    Hypertension 2008   PAD (peripheral artery disease) ([place])    Personal history of colonic polyps    TA polyp   Sleep apnea    not using cpap    Past Surgical History:  Procedure Laterality Date   AMPUTATION Right 09/15/2019   Procedure: AMPUTATION RIGHT FOOT 2ND DIGIT;  Surgeon: Christopher Escobar, DPM;  Location: [facility];  Service: Podiatry;  Laterality: Right;   AMPUTATION Left 02/08/2021   Procedure: AMPUTATION RAY-Partial 5th Ray;  Surgeon: Christopher Escobar, DPM;  Location: [facility];  Service: Podiatry;  Laterality: Left;   BACK SURGERY  1989   COLONOSCOPY  2013   COLONOSCOPY WITH PROPOFOL N/A 10/28/2016   Procedure: COLONOSCOPY WITH PROPOFOL;  Surgeon: Christopher Bellow, MD;  Location: [facility];  Service: Endoscopy;  Laterality: N/A;   SHOULDER ARTHROSCOPY WITH SUBACROMIAL DECOMPRESSION Left 04/02/2017   Procedure: SHOULDER ARTHROSCOPY WITH DECOMPRESSION, DEBRIDEMENT, SLAP REPAIR,;  Surgeon: Christopher Mull, MD;  Location: [facility];  Service:  Orthopedics;  Laterality: Left;   SHOULDER SURGERY Bilateral 2011,2012    Current Medications: Current Meds  Medication Sig   acetaminophen (TYLENOL) 325 MG tablet Take 2 tablets (650 mg total) by mouth every 6 (six) hours as needed for mild pain (or Fever >/= 101).   aspirin EC 81 MG tablet Take 1 tablet (81 mg total) by mouth daily. Swallow whole.   atorvastatin (LIPITOR) 40 MG tablet Take 40 mg by mouth daily at 6 PM.    insulin detemir (LEVEMIR) 100 UNIT/ML injection Inject 60 Units into the skin at bedtime.   JARDIANCE 25 MG TABS tablet Take 25 mg by mouth daily.   metoprolol succinate (TOPROL-XL) 25 MG 24 hr tablet Take 25 mg every morning by mouth.    multivitamin-lutein (OCUVITE-LUTEIN) CAPS capsule Take 1 capsule by mouth 2 (two) times daily.   mupirocin ointment (BACTROBAN) 2 % Apply 1 application topically 2 (two) times daily.   NOVOLOG FLEXPEN 100 UNIT/ML FlexPen Inject 7-10 Units into the skin 2 (two) times daily before a meal.   oxyCODONE (ROXICODONE) 15 MG immediate release tablet Take 15 mg by mouth every 6 (six) hours as needed.   OZEMPIC, 1 MG/DOSE, 4 MG/3ML SOPN Inject 1 mg into the skin once a week.   polyethylene glycol (MIRALAX) 17 g packet Take 17 g by mouth daily as needed.   pregabalin (LYRICA) 75 MG capsule Take 75 mg by mouth 2 (two) times daily.     Allergies:   Patient has no known allergies.   Social History   Socioeconomic History   Marital status: Married    Spouse name: Not on file   Number of children: Not on file   Years of education: Not on file   Highest education level: Not on file  Occupational History   Not on file  Tobacco Use   Smoking status: Former    Packs/day: 1.00    Years: 30.00    Total pack years: 30.00    Types: Cigarettes    Quit date: 02/03/2007    Years since quitting: 15.2   Smokeless tobacco: Never  Vaping Use   Vaping Use: Never used  Substance and Sexual Activity   Alcohol use: Yes    Comment: seldom   Drug use:  No   Sexual activity: Not on file  Other Topics Concern   Not on file  Social History Narrative   Not on file   Social Determinants of Health   Financial Resource Strain: Not on file  Food Insecurity: Not on file  Transportation Needs: Not on file  Physical Activity: Not on file  Stress: Not on file  Social Connections: Not on file     Family History:  The patient's family history includes Heart attack (age of onset: 65) in his father; Heart attack (age of onset: 28) in his brother; Heart attack (age of onset: 52) in his brother.  ROS:   12-point review of systems is negative unless otherwise noted in HPI.   EKGs/Labs/Other Studies Reviewed:    Studies  reviewed were summarized above. The additional studies were reviewed today:  2D echo 04/24/2022: 1. Left ventricular ejection fraction, by estimation, is 45 to 50%. The  left ventricle has mildly decreased function. The left ventricle  demonstrates global hypokinesis. Left ventricular diastolic parameters are  consistent with Grade II diastolic  dysfunction (pseudonormalization).   2. Right ventricular systolic function is normal. The right ventricular  size is normal. Tricuspid regurgitation signal is inadequate for assessing  PA pressure.   3. Left atrial size was moderately dilated.   4. The mitral valve is normal in structure. Mild mitral valve  regurgitation. No evidence of mitral stenosis.   5. The aortic valve is normal in structure. Aortic valve regurgitation is  mild. No aortic stenosis is present.   6. The inferior vena cava is normal in size with greater than 50%  respiratory variability, suggesting right atrial pressure of 3 mmHg.  __________  [facility] MPI 03/13/2022: Pharmacological myocardial perfusion imaging study with no significant  ischemia Small fixed region apical thinning, likely attenuation artifact Mild hypokinesis of the inferolateral wall, EF estimated at 37%, GI uptake artifact noted No EKG  changes concerning for ischemia at peak stress or in recovery.   CT attenuation correction images with three-vessel coronary calcification, mild aortic atherosclerosis Moderate risk scan given moderately reduced ejection fraction, echocardiogram pending   EKG:  EKG is ordered today.  The EKG ordered today demonstrates NSR, 92 bpm, occasional PVCs, left anterior fascicular block, poor R wave progression along the precordial leads, nonspecific ST-T changes  Recent Labs: No results found for requested labs within last 365 days.  Recent Lipid Panel No results found for: "CHOL", "TRIG", "HDL", "CHOLHDL", "VLDL", "LDLCALC", "LDLDIRECT"  PHYSICAL EXAM:    VS:  BP (!) 157/100 (BP Location: Left Arm, Patient Position: Sitting, Cuff Size: Large)   Pulse 98   Ht 5' 11"$  (1.803 m)   Wt 253 lb 9.6 oz (115 kg)   SpO2 97%   BMI 35.37 kg/m   BMI: Body mass index is 35.37 kg/m.  Physical Exam Constitutional:      Appearance: He is well-developed.  HENT:     Head: Normocephalic and atraumatic.  Eyes:     General:        Right eye: No discharge.        Left eye: No discharge.  Neck:     Comments: JVD difficult to assess secondary to body habitus. Cardiovascular:     Rate and Rhythm: Normal rate and regular rhythm. Occasional Extrasystoles are present.    Heart sounds: Normal heart sounds, S1 normal and S2 normal. Heart sounds not distant. No midsystolic click and no opening snap. No murmur heard.    No friction rub.  Pulmonary:     Effort: Pulmonary effort is normal. No respiratory distress.     Breath sounds: Decreased breath sounds present. No wheezing, rhonchi or rales.  Chest:     Chest wall: No tenderness.  Abdominal:     General: There is no distension.     Palpations: Abdomen is soft.     Tenderness: There is no abdominal tenderness.  Musculoskeletal:     Cervical back: Normal range of motion.     Right lower leg: Edema present.     Left lower leg: Edema present.     Comments:  Trivial bilateral pretibial pitting edema.  Skin:    General: Skin is warm and dry.     Nails: There is no clubbing.  Neurological:  Mental Status: He is alert and oriented to person, place, and time.  Psychiatric:        Speech: Speech normal.        Behavior: Behavior normal.        Thought Content: Thought content normal.        Judgment: Judgment normal.     Wt Readings from Last 3 Encounters:  05/13/22 253 lb 9.6 oz (115 kg)  03/03/22 247 lb 8 oz (112.3 kg)  02/14/22 247 lb 9.6 oz (112.3 kg)     ASSESSMENT & PLAN:   HFrEF: Cardiomyopathy is of uncertain etiology or chronicity.  He reports a longstanding history of exertional dyspnea that has been progressively getting worse over the past couple of months.  Recent echo showed an EF of 45 to 50%.  Lexiscan MPI, performed prior to echo, showed no significant ischemia.  However, in light of his progressive symptoms, with Lexiscan MPI showing multivessel coronary calcification, and with echo showing a cardiomyopathy we will pursue diagnostic R/LHC to evaluate for ischemic heart disease and to better understand hemodynamic status to help guide medical therapy.  He is uncertain when he last took his dose of Ozempic, therefore we will need to defer cardiac cath until early next week to ensure he has been without medication for 1 week per anesthesia regulations.  He would benefit from diuresis, however I would like to see an updated renal function and potassium prior to prescribing loop diuretic.  Obtain stat chest x-ray, CMP, CBC, and BNP.  Pending results, would look to add furosemide along with possible addition of ARB versus ARNI with continuation of Jardiance and Toprol-XL.  In follow-up, would look to optimize GDMT with addition of MRA as vitals and renal function allow.  CAD involving the native coronary arteries with progressive exertional dyspnea concerning for anginal equivalent:  Schedule R/LHC.  Continue risk factor modification  including addition of aspirin 81 mg daily with continuation of atorvastatin and metoprolol succinate.  PAD: Prior CT imaging has demonstrated aortic and iliac atherosclerosis.  Start aspirin 81 mg daily.  Remains on atorvastatin 40 mg.  IDDM with diabetic ulcer status post prior toe amputation: Bilateral ABI normal in 06/2021.  Historically has been followed by podiatry and wound clinic.  Will need to hold insulin prior to cardiac cath.  Would look to start ARB as renal function allows as outlined above.  Will need to hold Ozempic for 1 week prior to cardiac cath.  Ongoing management per PCP.  HTN: Blood pressure is mildly elevated in the office today.  Optimize medical therapy pending labs as outlined above.  HLD: No recent lipid panel available for review.  Currently on atorvastatin 40 mg.  Obtain LFT, lipid panel, and direct LDL with recommendation to achieve target LDL of at least less than 70, possibly 55 pending ischemic evaluation.  PVCs: May benefit from 3-day Zio patch following ischemic evaluation to quantify burden.  Unclear at this point if PVCs are in the setting of cardiomyopathy or if his cardiomyopathy is in the context of PVCs.  Remains on metoprolol.  Check potassium and magnesium.  No indication for AAD at this time.   Shared Decision Making/Informed Consent{  The risks [stroke (1 in 1000), death (1 in 1000), kidney failure [usually temporary] (1 in 500), bleeding (1 in 200), allergic reaction [possibly serious] (1 in 200)], benefits (diagnostic support and management of coronary artery disease) and alternatives of a cardiac catheterization were discussed in detail with Mr. Elgart and  he is willing to proceed.     Disposition: F/u with Dr. Rockey Escobar or an APP after cardiac cath.   Medication Adjustments/Labs and Tests Ordered: Current medicines are reviewed at length with the patient today.  Concerns regarding medicines are outlined above. Medication changes, Labs and Tests  ordered today are summarized above and listed in the Patient Instructions accessible in Encounters.   Signed, Christell Faith, PA-C 05/13/2022 2:47 PM     [address], [place] [id number] [phone]

## 2022-05-13 NOTE — H&P (View-Only) (Signed)
Cardiology Office Note    Date:  05/13/2022   ID:  Christopher Escobar, DOB 03-27-1956, MRN [id number]  PCP:  Albina Billet, MD  Cardiologist:  Ida Rogue, MD  Electrophysiologist:  None   Chief Complaint: Follow-up  History of Present Illness:   Christopher Escobar is a 67 y.o. male with history of CAD noted on CT imaging, HFrEF, PAD involving the aorta and iliac arteries, diabetes with peripheral neuropathy complicated by diabetic ulcers and prior amputations, HTN, HLD, prior tobacco use quitting in 2008, lumbar fusion, and OSA who presents for follow-up of his cardiomyopathy and coronary artery disease.  He was evaluated by Dr. Rockey Situ as a new patient on 03/03/2022 for exertional dyspnea.  At that time he reported shortness of breath for months to years and was limited in functional status secondary to toe amputations bilaterally.  With regards to his PAD, ABI was normal in 06/2021.  CT of the abdomen in 10/2017 showed aortic atherosclerosis extending into the iliac arteries.  Lexiscan MPI in 02/2022 showed no significant ischemia with a small fixed region of apical thinning felt to likely be attenuation artifact with mild hypokinesis of the inferolateral wall with an EF estimated at 37% with GI uptake artifact noted.  CT attenuation corrected images showed three-vessel coronary artery calcification and mild aortic atherosclerosis.  Overall, this was a moderate risk candidate given reduced EF.  Subsequent echo in 03/2022 demonstrated an EF of 45 to 50%, global hypokinesis, grade 2 diastolic dysfunction, normal RV systolic function and ventricular cavity size, moderately dilated left atrium, mild mitral regurgitation, and an estimated right atrial pressure of 3 mmHg.  No changes were made.  He comes in today indicating he is not feeling well.  He notes a longstanding history of exertional shortness of breath.  He indicates this has been going on "a long time."  However, since he was last seen in  December, his exertional shortness of breath is getting worse.  He is without symptoms of frank angina.  He was unable to ambulate in from the parking lot to the entryway to the medical arts building today without having to stop twice.  He also notes some mild lower extremity swelling.  Last evening, he was unable to lay flat to sleep.  This was the first time this has occurred.  His weight is up 6 pounds today when compared to his visit in our office in 02/2022.  He reports eating salty food and eats out at restaurants 2-3 times per week.  No symptoms of dizziness, presyncope, or syncope.   Labs independently reviewed: 01/2021 - Hgb 14.5, PLT 234, potassium 3.7, BUN 18, serum creatinine 1.07, A1c 9.1, albumin 3.4, AST/ALT not elevated 05/2017 - TSH normal   Past Medical History:  Diagnosis Date   CAD (coronary artery disease)    Diabetes mellitus with complication ([place]) [id number]   HFrEF (heart failure with reduced ejection fraction) ([place])    Hypertension 2008   PAD (peripheral artery disease) ([place])    Personal history of colonic polyps    TA polyp   Sleep apnea    not using cpap    Past Surgical History:  Procedure Laterality Date   AMPUTATION Right 09/15/2019   Procedure: AMPUTATION RIGHT FOOT 2ND DIGIT;  Surgeon: Felipa Furnace, DPM;  Location: [place];  Service: Podiatry;  Laterality: Right;   AMPUTATION Left 02/08/2021   Procedure: AMPUTATION RAY-Partial 5th Ray;  Surgeon: [name], DPM;  Location: [facility];  Service: Podiatry;  Laterality: Left;   BACK SURGERY  1989   COLONOSCOPY  2013   COLONOSCOPY WITH PROPOFOL N/A 10/28/2016   Procedure: COLONOSCOPY WITH PROPOFOL;  Surgeon: Robert Bellow, MD;  Location: [facility];  Service: Endoscopy;  Laterality: N/A;   SHOULDER ARTHROSCOPY WITH SUBACROMIAL DECOMPRESSION Left 04/02/2017   Procedure: SHOULDER ARTHROSCOPY WITH DECOMPRESSION, DEBRIDEMENT, SLAP REPAIR,;  Surgeon: Corky Mull, MD;  Location: [facility];  Service:  Orthopedics;  Laterality: Left;   SHOULDER SURGERY Bilateral 2011,2012    Current Medications: Current Meds  Medication Sig   acetaminophen (TYLENOL) 325 MG tablet Take 2 tablets (650 mg total) by mouth every 6 (six) hours as needed for mild pain (or Fever >/= 101).   aspirin EC 81 MG tablet Take 1 tablet (81 mg total) by mouth daily. Swallow whole.   atorvastatin (LIPITOR) 40 MG tablet Take 40 mg by mouth daily at 6 PM.    insulin detemir (LEVEMIR) 100 UNIT/ML injection Inject 60 Units into the skin at bedtime.   JARDIANCE 25 MG TABS tablet Take 25 mg by mouth daily.   metoprolol succinate (TOPROL-XL) 25 MG 24 hr tablet Take 25 mg every morning by mouth.    multivitamin-lutein (OCUVITE-LUTEIN) CAPS capsule Take 1 capsule by mouth 2 (two) times daily.   mupirocin ointment (BACTROBAN) 2 % Apply 1 application topically 2 (two) times daily.   NOVOLOG FLEXPEN 100 UNIT/ML FlexPen Inject 7-10 Units into the skin 2 (two) times daily before a meal.   oxyCODONE (ROXICODONE) 15 MG immediate release tablet Take 15 mg by mouth every 6 (six) hours as needed.   OZEMPIC, 1 MG/DOSE, 4 MG/3ML SOPN Inject 1 mg into the skin once a week.   polyethylene glycol (MIRALAX) 17 g packet Take 17 g by mouth daily as needed.   pregabalin (LYRICA) 75 MG capsule Take 75 mg by mouth 2 (two) times daily.     Allergies:   Patient has no known allergies.   Social History   Socioeconomic History   Marital status: Married    Spouse name: Not on file   Number of children: Not on file   Years of education: Not on file   Highest education level: Not on file  Occupational History   Not on file  Tobacco Use   Smoking status: Former    Packs/day: 1.00    Years: 30.00    Total pack years: 30.00    Types: Cigarettes    Quit date: 02/03/2007    Years since quitting: 15.2   Smokeless tobacco: Never  Vaping Use   Vaping Use: Never used  Substance and Sexual Activity   Alcohol use: Yes    Comment: seldom   Drug use:  No   Sexual activity: Not on file  Other Topics Concern   Not on file  Social History Narrative   Not on file   Social Determinants of Health   Financial Resource Strain: Not on file  Food Insecurity: Not on file  Transportation Needs: Not on file  Physical Activity: Not on file  Stress: Not on file  Social Connections: Not on file     Family History:  The patient's family history includes Heart attack (age of onset: 17) in his father; Heart attack (age of onset: 29) in his brother; Heart attack (age of onset: 21) in his brother.  ROS:   12-point review of systems is negative unless otherwise noted in HPI.   EKGs/Labs/Other Studies Reviewed:    Studies  reviewed were summarized above. The additional studies were reviewed today:  2D echo 04/24/2022: 1. Left ventricular ejection fraction, by estimation, is 45 to 50%. The  left ventricle has mildly decreased function. The left ventricle  demonstrates global hypokinesis. Left ventricular diastolic parameters are  consistent with Grade II diastolic  dysfunction (pseudonormalization).   2. Right ventricular systolic function is normal. The right ventricular  size is normal. Tricuspid regurgitation signal is inadequate for assessing  PA pressure.   3. Left atrial size was moderately dilated.   4. The mitral valve is normal in structure. Mild mitral valve  regurgitation. No evidence of mitral stenosis.   5. The aortic valve is normal in structure. Aortic valve regurgitation is  mild. No aortic stenosis is present.   6. The inferior vena cava is normal in size with greater than 50%  respiratory variability, suggesting right atrial pressure of 3 mmHg.  __________  [facility] MPI 03/13/2022: Pharmacological myocardial perfusion imaging study with no significant  ischemia Small fixed region apical thinning, likely attenuation artifact Mild hypokinesis of the inferolateral wall, EF estimated at 37%, GI uptake artifact noted No EKG  changes concerning for ischemia at peak stress or in recovery.   CT attenuation correction images with three-vessel coronary calcification, mild aortic atherosclerosis Moderate risk scan given moderately reduced ejection fraction, echocardiogram pending   EKG:  EKG is ordered today.  The EKG ordered today demonstrates NSR, 92 bpm, occasional PVCs, left anterior fascicular block, poor R wave progression along the precordial leads, nonspecific ST-T changes  Recent Labs: No results found for requested labs within last 365 days.  Recent Lipid Panel No results found for: "CHOL", "TRIG", "HDL", "CHOLHDL", "VLDL", "LDLCALC", "LDLDIRECT"  PHYSICAL EXAM:    VS:  BP (!) 157/100 (BP Location: Left Arm, Patient Position: Sitting, Cuff Size: Large)   Pulse 98   Ht 5' 11"$  (1.803 m)   Wt 253 lb 9.6 oz (115 kg)   SpO2 97%   BMI 35.37 kg/m   BMI: Body mass index is 35.37 kg/m.  Physical Exam Constitutional:      Appearance: He is well-developed.  HENT:     Head: Normocephalic and atraumatic.  Eyes:     General:        Right eye: No discharge.        Left eye: No discharge.  Neck:     Comments: JVD difficult to assess secondary to body habitus. Cardiovascular:     Rate and Rhythm: Normal rate and regular rhythm. Occasional Extrasystoles are present.    Heart sounds: Normal heart sounds, S1 normal and S2 normal. Heart sounds not distant. No midsystolic click and no opening snap. No murmur heard.    No friction rub.  Pulmonary:     Effort: Pulmonary effort is normal. No respiratory distress.     Breath sounds: Decreased breath sounds present. No wheezing, rhonchi or rales.  Chest:     Chest wall: No tenderness.  Abdominal:     General: There is no distension.     Palpations: Abdomen is soft.     Tenderness: There is no abdominal tenderness.  Musculoskeletal:     Cervical back: Normal range of motion.     Right lower leg: Edema present.     Left lower leg: Edema present.     Comments:  Trivial bilateral pretibial pitting edema.  Skin:    General: Skin is warm and dry.     Nails: There is no clubbing.  Neurological:  Mental Status: He is alert and oriented to person, place, and time.  Psychiatric:        Speech: Speech normal.        Behavior: Behavior normal.        Thought Content: Thought content normal.        Judgment: Judgment normal.     Wt Readings from Last 3 Encounters:  05/13/22 253 lb 9.6 oz (115 kg)  03/03/22 247 lb 8 oz (112.3 kg)  02/14/22 247 lb 9.6 oz (112.3 kg)     ASSESSMENT & PLAN:   HFrEF: Cardiomyopathy is of uncertain etiology or chronicity.  He reports a longstanding history of exertional dyspnea that has been progressively getting worse over the past couple of months.  Recent echo showed an EF of 45 to 50%.  Lexiscan MPI, performed prior to echo, showed no significant ischemia.  However, in light of his progressive symptoms, with Lexiscan MPI showing multivessel coronary calcification, and with echo showing a cardiomyopathy we will pursue diagnostic R/LHC to evaluate for ischemic heart disease and to better understand hemodynamic status to help guide medical therapy.  He is uncertain when he last took his dose of Ozempic, therefore we will need to defer cardiac cath until early next week to ensure he has been without medication for 1 week per anesthesia regulations.  He would benefit from diuresis, however I would like to see an updated renal function and potassium prior to prescribing loop diuretic.  Obtain stat chest x-ray, CMP, CBC, and BNP.  Pending results, would look to add furosemide along with possible addition of ARB versus ARNI with continuation of Jardiance and Toprol-XL.  In follow-up, would look to optimize GDMT with addition of MRA as vitals and renal function allow.  CAD involving the native coronary arteries with progressive exertional dyspnea concerning for anginal equivalent:  Schedule R/LHC.  Continue risk factor modification  including addition of aspirin 81 mg daily with continuation of atorvastatin and metoprolol succinate.  PAD: Prior CT imaging has demonstrated aortic and iliac atherosclerosis.  Start aspirin 81 mg daily.  Remains on atorvastatin 40 mg.  IDDM with diabetic ulcer status post prior toe amputation: Bilateral ABI normal in 06/2021.  Historically has been followed by podiatry and wound clinic.  Will need to hold insulin prior to cardiac cath.  Would look to start ARB as renal function allows as outlined above.  Will need to hold Ozempic for 1 week prior to cardiac cath.  Ongoing management per PCP.  HTN: Blood pressure is mildly elevated in the office today.  Optimize medical therapy pending labs as outlined above.  HLD: No recent lipid panel available for review.  Currently on atorvastatin 40 mg.  Obtain LFT, lipid panel, and direct LDL with recommendation to achieve target LDL of at least less than 70, possibly 55 pending ischemic evaluation.  PVCs: May benefit from 3-day Zio patch following ischemic evaluation to quantify burden.  Unclear at this point if PVCs are in the setting of cardiomyopathy or if his cardiomyopathy is in the context of PVCs.  Remains on metoprolol.  Check potassium and magnesium.  No indication for AAD at this time.   Shared Decision Making/Informed Consent{  The risks [stroke (1 in 1000), death (1 in 1000), kidney failure [usually temporary] (1 in 500), bleeding (1 in 200), allergic reaction [possibly serious] (1 in 200)], benefits (diagnostic support and management of coronary artery disease) and alternatives of a cardiac catheterization were discussed in detail with Mr. Aber and  he is willing to proceed.     Disposition: F/u with Dr. Rockey Situ or an APP after cardiac cath.   Medication Adjustments/Labs and Tests Ordered: Current medicines are reviewed at length with the patient today.  Concerns regarding medicines are outlined above. Medication changes, Labs and Tests  ordered today are summarized above and listed in the Patient Instructions accessible in Encounters.   Signed, Christell Faith, PA-C 05/13/2022 2:47 PM     [address] [place], [place] [address] [phone]

## 2022-05-13 NOTE — Telephone Encounter (Signed)
-----   Message from Rise Mu, [place] sent at 05/13/2022  4:07 PM EST ----- Please inform the patient his renal function is normal.  Potassium low.  Random glucose mildly elevated.  Liver function and protein level normal.  BNP is elevated at 855.  WBC elevated at 12.8 which has been a chronic issue for him.  Total cholesterol, triglycerides, and LDL at goal.  HDL mildly low.  Chest x-ray did show evidence of volume overload with bilateral pleural effusions.  Recommendations: -Start furosemide 40 mg daily for 3 days followed by 20 mg daily thereafter -Start KCl 60 mEq for 3 days followed by 20 mEq daily thereafter -Follow-up BMET in 1 week to ensure stable renal function and improved potassium -Otherwise, continue with planned [facility]

## 2022-05-13 NOTE — Telephone Encounter (Signed)
-----   Message from Rise Mu, [place] sent at 05/13/2022  4:07 PM EST ----- Please inform the patient his renal function is normal.  Potassium low.  Random glucose mildly elevated.  Liver function and protein level normal.  BNP is elevated at 855.  WBC elevated at 12.8 which has been a chronic issue for him.  Total cholesterol, triglycerides, and LDL at goal.  HDL mildly low.  Chest x-ray did show evidence of volume overload with bilateral pleural effusions.  Recommendations: -Start furosemide 40 mg daily for 3 days followed by 20 mg daily thereafter -Start KCl 60 mEq for 3 days followed by 20 mEq daily thereafter -Follow-up BMET in 1 week to ensure stable renal function and improved potassium -Otherwise, continue with planned [facility]

## 2022-05-19 ENCOUNTER — Ambulatory Visit
Admission: RE | Admit: 2022-05-19 | Discharge: 2022-05-19 | Disposition: A | Discharge status: Home / Self Care | Payer: Medicare PPO | Source: Ambulatory Visit | Attending: Cardiovascular Disease | Admitting: Cardiovascular Disease

## 2022-05-19 ENCOUNTER — Other Ambulatory Visit: Payer: Self-pay

## 2022-05-19 ENCOUNTER — Encounter
Admission: RE | Disposition: A | Discharge status: Home / Self Care | Payer: Self-pay | Source: Ambulatory Visit | Attending: Cardiovascular Disease

## 2022-05-19 ENCOUNTER — Encounter: Payer: Self-pay | Admitting: Cardiovascular Disease

## 2022-05-19 DIAGNOSIS — I5022 Chronic systolic (congestive) heart failure: Secondary | ICD-10-CM | POA: Diagnosis not present

## 2022-05-19 DIAGNOSIS — R0602 Shortness of breath: Secondary | ICD-10-CM | POA: Diagnosis not present

## 2022-05-19 DIAGNOSIS — Z79899 Other long term (current) drug therapy: Secondary | ICD-10-CM | POA: Diagnosis not present

## 2022-05-19 DIAGNOSIS — I2510 Atherosclerotic heart disease of native coronary artery without angina pectoris: Secondary | ICD-10-CM | POA: Insufficient documentation

## 2022-05-19 DIAGNOSIS — R0609 Other forms of dyspnea: Secondary | ICD-10-CM | POA: Diagnosis not present

## 2022-05-19 DIAGNOSIS — I110 Hypertensive heart disease with heart failure: Secondary | ICD-10-CM | POA: Insufficient documentation

## 2022-05-19 DIAGNOSIS — I428 Other cardiomyopathies: Secondary | ICD-10-CM | POA: Insufficient documentation

## 2022-05-19 DIAGNOSIS — Z794 Long term (current) use of insulin: Secondary | ICD-10-CM | POA: Insufficient documentation

## 2022-05-19 DIAGNOSIS — E785 Hyperlipidemia, unspecified: Secondary | ICD-10-CM | POA: Diagnosis not present

## 2022-05-19 DIAGNOSIS — I270 Primary pulmonary hypertension: Secondary | ICD-10-CM | POA: Diagnosis not present

## 2022-05-19 DIAGNOSIS — I272 Pulmonary hypertension, unspecified: Secondary | ICD-10-CM | POA: Diagnosis not present

## 2022-05-19 DIAGNOSIS — Z7985 Long-term (current) use of injectable non-insulin antidiabetic drugs: Secondary | ICD-10-CM | POA: Diagnosis not present

## 2022-05-19 DIAGNOSIS — I5020 Unspecified systolic (congestive) heart failure: Secondary | ICD-10-CM

## 2022-05-19 DIAGNOSIS — E1151 Type 2 diabetes mellitus with diabetic peripheral angiopathy without gangrene: Secondary | ICD-10-CM | POA: Diagnosis not present

## 2022-05-19 DIAGNOSIS — Z87891 Personal history of nicotine dependence: Secondary | ICD-10-CM | POA: Diagnosis not present

## 2022-05-19 HISTORY — PX: RIGHT/LEFT HEART CATH AND CORONARY ANGIOGRAPHY: CATH118266

## 2022-05-19 LAB — POCT I-STAT 7, (LYTES, BLD GAS, ICA,H+H)
Acid-base deficit: 1 mmol/L (ref 0.0–2.0)
Bicarbonate: 22.6 mmol/L (ref 20.0–28.0)
Calcium, Ion: 1.2 mmol/L (ref 1.15–1.40)
HCT: 51 % (ref 39.0–52.0)
Hemoglobin: 17.3 g/dL — ABNORMAL HIGH (ref 13.0–17.0)
O2 Saturation: 93 %
Potassium: 3.9 mmol/L (ref 3.5–5.1)
Sodium: 139 mmol/L (ref 135–145)
TCO2: 24 mmol/L (ref 22–32)
pCO2 arterial: 34.7 mmHg (ref 32–48)
pH, Arterial: 7.422 (ref 7.35–7.45)
pO2, Arterial: 64 mmHg — ABNORMAL LOW (ref 83–108)

## 2022-05-19 LAB — POCT I-STAT EG7
Acid-Base Excess: 0 mmol/L (ref 0.0–2.0)
Bicarbonate: 25.2 mmol/L (ref 20.0–28.0)
Calcium, Ion: 1.22 mmol/L (ref 1.15–1.40)
HCT: 51 % (ref 39.0–52.0)
Hemoglobin: 17.3 g/dL — ABNORMAL HIGH (ref 13.0–17.0)
O2 Saturation: 68 %
Potassium: 3.9 mmol/L (ref 3.5–5.1)
Sodium: 140 mmol/L (ref 135–145)
TCO2: 26 mmol/L (ref 22–32)
pCO2, Ven: 40.3 mmHg — ABNORMAL LOW (ref 44–60)
pH, Ven: 7.405 (ref 7.25–7.43)
pO2, Ven: 35 mmHg (ref 32–45)

## 2022-05-19 LAB — GLUCOSE, CAPILLARY: Glucose-Capillary: 157 mg/dL — ABNORMAL HIGH (ref 70–99)

## 2022-05-19 SURGERY — RIGHT/LEFT HEART CATH AND CORONARY ANGIOGRAPHY
Anesthesia: Moderate Sedation | Laterality: Bilateral

## 2022-05-19 MED ORDER — HEPARIN (PORCINE) IN NACL 1000-0.9 UT/500ML-% IV SOLN
INTRAVENOUS | Status: AC
  Filled 2022-05-19: qty 1000

## 2022-05-19 MED ORDER — SODIUM CHLORIDE 0.9 % IV SOLN: 250.0000 mL | INTRAVENOUS | Status: DC | PRN

## 2022-05-19 MED ORDER — MIDAZOLAM HCL 2 MG/2ML IJ SOLN
INTRAMUSCULAR | Status: AC
  Filled 2022-05-19: qty 2

## 2022-05-19 MED ORDER — SODIUM CHLORIDE 0.9 % IV SOLN: INTRAVENOUS | Status: DC

## 2022-05-19 MED ORDER — IOHEXOL 300 MG/ML  SOLN
INTRAMUSCULAR | Status: DC | PRN
  Administered 2022-05-19: 41 mL

## 2022-05-19 MED ORDER — MIDAZOLAM HCL 2 MG/2ML IJ SOLN
INTRAMUSCULAR | Status: DC | PRN
  Administered 2022-05-19: 1 mg via INTRAVENOUS

## 2022-05-19 MED ORDER — VERAPAMIL HCL 2.5 MG/ML IV SOLN
INTRAVENOUS | Status: DC | PRN
  Administered 2022-05-19: 2.5 mg via INTRA_ARTERIAL

## 2022-05-19 MED ORDER — SODIUM CHLORIDE 0.9% FLUSH: 3.0000 mL | INTRAVENOUS | Status: DC | PRN

## 2022-05-19 MED ORDER — VERAPAMIL HCL 2.5 MG/ML IV SOLN
INTRAVENOUS | Status: AC
  Filled 2022-05-19: qty 2

## 2022-05-19 MED ORDER — HEPARIN (PORCINE) IN NACL 1000-0.9 UT/500ML-% IV SOLN
INTRAVENOUS | Status: DC | PRN
  Administered 2022-05-19 (×2): 500 mL

## 2022-05-19 MED ORDER — SODIUM CHLORIDE 0.9% FLUSH: 3.0000 mL | Freq: Two times a day (BID) | INTRAVENOUS | Status: DC

## 2022-05-19 MED ORDER — ONDANSETRON HCL 4 MG/2ML IJ SOLN: 4.0000 mg | Freq: Four times a day (QID) | INTRAMUSCULAR | Status: DC | PRN

## 2022-05-19 MED ORDER — FENTANYL CITRATE (PF) 100 MCG/2ML IJ SOLN
INTRAMUSCULAR | Status: DC | PRN
  Administered 2022-05-19: 25 ug via INTRAVENOUS

## 2022-05-19 MED ORDER — FENTANYL CITRATE (PF) 100 MCG/2ML IJ SOLN
INTRAMUSCULAR | Status: AC
  Filled 2022-05-19: qty 2

## 2022-05-19 MED ORDER — ACETAMINOPHEN 325 MG PO TABS: 650.0000 mg | ORAL_TABLET | ORAL | Status: DC | PRN

## 2022-05-19 MED ORDER — HEPARIN SODIUM (PORCINE) 1000 UNIT/ML IJ SOLN
INTRAMUSCULAR | Status: AC
  Filled 2022-05-19: qty 10

## 2022-05-19 MED ORDER — HEPARIN SODIUM (PORCINE) 1000 UNIT/ML IJ SOLN
INTRAMUSCULAR | Status: DC | PRN
  Administered 2022-05-19: 5000 [IU] via INTRAVENOUS

## 2022-05-19 SURGICAL SUPPLY — 14 items
BAND ZEPHYR COMPRESS 30 LONG (HEMOSTASIS) IMPLANT
CATH BALLN WEDGE 5F 110CM (CATHETERS) IMPLANT
CATH INFINITI 5FR JK (CATHETERS) IMPLANT
DRAPE BRACHIAL (DRAPES) IMPLANT
GLIDESHEATH SLEND SS 6F .021 (SHEATH) IMPLANT
GUIDEWIRE EMER 3M J .025X150CM (WIRE) IMPLANT
GUIDEWIRE INQWIRE 1.5J.035X260 (WIRE) IMPLANT
INQWIRE 1.5J .035X260CM (WIRE) ×1
KIT SYRINGE INJ CVI SPIKEX1 (MISCELLANEOUS) IMPLANT
PACK CARDIAC CATH (CUSTOM PROCEDURE TRAY) ×1 IMPLANT
PROTECTION STATION PRESSURIZED (MISCELLANEOUS) ×1
SET ATX SIMPLICITY (MISCELLANEOUS) IMPLANT
SHEATH GLIDE SLENDER 4/5FR (SHEATH) IMPLANT
STATION PROTECTION PRESSURIZED (MISCELLANEOUS) IMPLANT

## 2022-05-19 NOTE — Interval H&P Note (Signed)
History and Physical Interval Note:  05/19/2022 9:52 AM  Christopher Escobar  has presented today for surgery, with the diagnosis of R and L Cath    Shortness of breath   Acute HF.  The various methods of treatment have been discussed with the patient and family. After consideration of risks, benefits and other options for treatment, the patient has consented to  Procedure(s): RIGHT/LEFT HEART CATH AND CORONARY ANGIOGRAPHY (Bilateral) as a surgical intervention.  The patient's history has been reviewed, patient examined, no change in status, stable for surgery.  I have reviewed the patient's chart and labs.  Questions were answered to the patient's satisfaction.     Kathlyn Sacramento

## 2022-05-20 ENCOUNTER — Encounter: Payer: Self-pay | Admitting: Cardiovascular Disease

## 2022-05-27 ENCOUNTER — Other Ambulatory Visit
Admission: RE | Admit: 2022-05-27 | Discharge: 2022-05-27 | Disposition: A | Discharge status: Home / Self Care | Payer: Medicare PPO | Source: Ambulatory Visit | Attending: Cardiology | Admitting: Cardiology

## 2022-05-27 ENCOUNTER — Ambulatory Visit: Payer: Medicare PPO | Attending: Cardiology | Admitting: Cardiology

## 2022-05-27 ENCOUNTER — Encounter: Payer: Self-pay | Admitting: Cardiology

## 2022-05-27 VITALS — BP 110/72 | HR 87 | Ht 71.0 in | Wt 245.2 lb

## 2022-05-27 DIAGNOSIS — E785 Hyperlipidemia, unspecified: Secondary | ICD-10-CM

## 2022-05-27 DIAGNOSIS — R0602 Shortness of breath: Secondary | ICD-10-CM

## 2022-05-27 DIAGNOSIS — I25118 Atherosclerotic heart disease of native coronary artery with other forms of angina pectoris: Secondary | ICD-10-CM | POA: Diagnosis not present

## 2022-05-27 DIAGNOSIS — I5020 Unspecified systolic (congestive) heart failure: Secondary | ICD-10-CM | POA: Diagnosis not present

## 2022-05-27 DIAGNOSIS — I10 Essential (primary) hypertension: Secondary | ICD-10-CM | POA: Diagnosis not present

## 2022-05-27 DIAGNOSIS — I493 Ventricular premature depolarization: Secondary | ICD-10-CM

## 2022-05-27 LAB — BASIC METABOLIC PANEL
Anion gap: 10 (ref 5–15)
BUN: 26 mg/dL — ABNORMAL HIGH (ref 8–23)
CO2: 23 mmol/L (ref 22–32)
Calcium: 9.3 mg/dL (ref 8.9–10.3)
Chloride: 102 mmol/L (ref 98–111)
Creatinine, Ser: 1.11 mg/dL (ref 0.61–1.24)
GFR, Estimated: >60 mL/min (ref >60)
Glucose, Bld: 188 mg/dL — ABNORMAL HIGH (ref 70–99)
Potassium: 3.8 mmol/L (ref 3.5–5.1)
Sodium: 135 mmol/L (ref 135–145)

## 2022-05-27 NOTE — Patient Instructions (Signed)
Medication Instructions:  No changes at this time.   *If you need a refill on your cardiac medications before your next appointment, please call your pharmacy*   Lab Work: [facility] today over at the [facility]. Stop at registration to check in.   If you have labs (blood work) drawn today and your tests are completely normal, you will receive your results only by: [place] (if you have MyChart) OR A paper copy in the mail If you have any lab test that is abnormal or we need to change your treatment, we will call you to review the results.   Testing/Procedures: None   Follow-Up: At [facility], you and your health needs are our priority.  As part of our continuing mission to provide you with exceptional heart care, we have created designated Provider Care Teams.  These Care Teams include your primary Cardiologist (physician) and Advanced Practice Providers (APPs -  Physician Assistants and Nurse Practitioners) who all work together to provide you with the care you need, when you need it.   Your next appointment:   First available  Provider:   Ida Rogue, MD or Gerrie Nordmann, NP

## 2022-05-27 NOTE — Progress Notes (Unsigned)
Cardiology Clinic Note   Patient Name: Christopher Escobar Date of Encounter: 05/28/2022  Primary Care Provider:  Albina Billet, MD Primary Cardiologist:  Ida Rogue, MD  Patient Profile    67 year old male with history coronary artery disease noted on CT imaging, HFrEF, PAD involving the aorta and iliac arteries, diabetes with peripheral neuropathy complicated by diabetic ulcer and prior amputations, hypertension, hyperlipidemia, prior tobacco use quitting in 2008, lumbar fusion, OSA who presents today for hospital follow-up after undergoing a right and left heart catheterization.  Past Medical History    Past Medical History:  Diagnosis Date   CAD (coronary artery disease)    Diabetes mellitus with complication ([place]) [id number]   HFrEF (heart failure with reduced ejection fraction) ([place])    Hypertension 2008   PAD (peripheral artery disease) ([place])    Personal history of colonic polyps    TA polyp   Sleep apnea    not using cpap   Past Surgical History:  Procedure Laterality Date   AMPUTATION Right 09/15/2019   Procedure: AMPUTATION RIGHT FOOT 2ND DIGIT;  Surgeon: Felipa Furnace, DPM;  Location: [facility];  Service: Podiatry;  Laterality: Right;   AMPUTATION Left 02/08/2021   Procedure: AMPUTATION RAY-Partial 5th Ray;  Surgeon: [name], DPM;  Location: [facility];  Service: Podiatry;  Laterality: Left;   BACK SURGERY  1989   COLONOSCOPY  2013   COLONOSCOPY WITH PROPOFOL N/A 10/28/2016   Procedure: COLONOSCOPY WITH PROPOFOL;  Surgeon: Robert Bellow, MD;  Location: [facility];  Service: Endoscopy;  Laterality: N/A;   RIGHT/LEFT HEART CATH AND CORONARY ANGIOGRAPHY Bilateral 05/19/2022   Procedure: RIGHT/LEFT HEART CATH AND CORONARY ANGIOGRAPHY;  Surgeon: Wellington Hampshire, MD;  Location: [facility];  Service: Cardiovascular;  Laterality: Bilateral;   SHOULDER ARTHROSCOPY WITH SUBACROMIAL DECOMPRESSION Left 04/02/2017   Procedure: SHOULDER ARTHROSCOPY WITH  DECOMPRESSION, DEBRIDEMENT, SLAP REPAIR,;  Surgeon: Corky Mull, MD;  Location: [facility];  Service: Orthopedics;  Laterality: Left;   SHOULDER SURGERY Bilateral 2011,2012    Allergies  No Known Allergies  History of Present Illness    Christopher Escobar is a 67 year old male with the previously mentioned complex past medical history.  He was a new patient to clinic evaluated by Dr. Rockey Situ 03/03/2022 for exertional dyspnea.  At that time he reported several months of shortness of breath limited to functional status secondary to bilateral toe amputations.  ABIs were normal in 06/2021.  CT of the abdomen in 10/2017 showed aortic atherosclerosis 6 extending into the iliac arteries.  He did undergo Lexiscan MPI in 02/2022 which showed no significant ischemia with a small fixed region of apical thinning, mild hypokinesis of the inferior lateral wall with an EF estimated at 37%.  CT attenuation corrected images showed three-vessel coronary artery calcification and mild aortic atherosclerosis.  Overall this is a moderate risk study given reduced EF.  Repeat echocardiogram completed in 03/2022 demonstrated an EF of 45-50%, global hypokinesis, G2 DD, mild mitral regurgitation.  He was last seen in clinic 05/13/2022 by our [place], [place].  At that time he was not feeling well he noted a longstanding history of exertional shortness of breath and states been going on for a long time however since he was last seen in December it was getting worse.  At that time it was decided that he would undergo right and left heart catheterization for further diagnostics to determine treatment options.  So on 05/19/2022 he underwent right  and left heart catheterization which revealed mild left ventricular systolic dysfunction, LV end systolic pressure was mildly moderately elevated, and left ventricular ejection fraction of 35-40% by visual estimate, there was mild nonobstructive coronary artery disease, moderately reduced LV systolic  function, right heart catheterization showed normal right atrial pressure, mild pulmonary hypertension, mildly elevated wedge pressure of 17 mmHg normal cardiac output.  He returns to clinic today stating that overall his breathing has greatly improved.  He has had no complications since his procedure and his right radial and right AC sites.  He denies any chest pain or anginal equivalents.  Has been compliant with his medication regimen.  Has been working on improving dietary changes.  But overall is feeling better today.  Home Medications    Current Outpatient Medications  Medication Sig Dispense Refill   aspirin EC 81 MG tablet Take 1 tablet (81 mg total) by mouth daily. Swallow whole. 30 tablet 1   atorvastatin (LIPITOR) 40 MG tablet Take 40 mg by mouth daily at 6 PM.      furosemide (LASIX) 20 MG tablet Take 2 tablets by mouth for 3 days then take 1 tablet by mouth daily thereafter 33 tablet 1   insulin detemir (LEVEMIR) 100 UNIT/ML injection Inject 60 Units into the skin at bedtime.     JARDIANCE 25 MG TABS tablet Take 25 mg by mouth daily.     metoprolol succinate (TOPROL-XL) 25 MG 24 hr tablet Take 25 mg every morning by mouth.      multivitamin-lutein (OCUVITE-LUTEIN) CAPS capsule Take 1 capsule by mouth 2 (two) times daily.     NOVOLOG FLEXPEN 100 UNIT/ML FlexPen Inject 7-10 Units into the skin 2 (two) times daily before a meal.     oxyCODONE (ROXICODONE) 15 MG immediate release tablet Take 15 mg by mouth every 6 (six) hours as needed.     OZEMPIC, 1 MG/DOSE, 4 MG/3ML SOPN Inject 1 mg into the skin once a week.     polyethylene glycol (MIRALAX) 17 g packet Take 17 g by mouth daily as needed. 30 each 0   potassium chloride SA (KLOR-CON M) 20 MEQ tablet Take 3 tablets by mouth daily for 3 days then 1 tablet daily thereafter. 36 tablet 1   pregabalin (LYRICA) 75 MG capsule Take 75 mg by mouth 2 (two) times daily.      acetaminophen (TYLENOL) 325 MG tablet Take 2 tablets (650 mg total) by  mouth every 6 (six) hours as needed for mild pain (or Fever >/= 101). (Patient not taking: Reported on 05/27/2022)     collagenase (SANTYL) ointment Apply 1 application topically daily. (Patient not taking: Reported on 05/13/2022) 30 g 1   mupirocin ointment (BACTROBAN) 2 % Apply 1 application topically 2 (two) times daily. (Patient not taking: Reported on 05/19/2022) 22 g 0   senna (SENOKOT) 8.6 MG TABS tablet Take 2 tablets (17.2 mg total) by mouth at bedtime. (Patient not taking: Reported on 05/13/2022) 60 tablet 0   tamsulosin (FLOMAX) 0.4 MG CAPS capsule Take 0.4 mg by mouth daily. (Patient not taking: Reported on 05/13/2022)     No current facility-administered medications for this visit.     Family History    Family History  Problem Relation Age of Onset   Heart attack Father 51   Heart attack Brother 93   Heart attack Brother 42   He indicated that his mother is deceased. He indicated that his father is deceased. He indicated that only one of his  two brothers is alive.  Social History    Social History   Socioeconomic History   Marital status: Married    Spouse name: Not on file   Number of children: Not on file   Years of education: Not on file   Highest education level: Not on file  Occupational History   Not on file  Tobacco Use   Smoking status: Former    Packs/day: 1.00    Years: 30.00    Total pack years: 30.00    Types: Cigarettes    Quit date: 02/03/2007    Years since quitting: 15.3   Smokeless tobacco: Never  Vaping Use   Vaping Use: Never used  Substance and Sexual Activity   Alcohol use: Yes    Comment: seldom   Drug use: No   Sexual activity: Not on file  Other Topics Concern   Not on file  Social History Narrative   Lives with wife, Denman George.   Social Determinants of Health   Financial Resource Strain: Not on file  Food Insecurity: Not on file  Transportation Needs: Not on file  Physical Activity: Not on file  Stress: Not on file  Social  Connections: Not on file  Intimate Partner Violence: Not on file     Review of Systems    General:  No chills, fever, night sweats or weight changes.  Cardiovascular:  No chest pain, endorses chronic dyspnea on exertion that is improving, endorses occasional mild edema, orthopnea, palpitations, paroxysmal nocturnal dyspnea. Dermatological: No rash, lesions/masses Respiratory: No cough, endorses chronic dyspnea Urologic: No hematuria, dysuria Abdominal:   No nausea, vomiting, diarrhea, bright red blood per rectum, melena, or hematemesis Neurologic:  No visual changes, wkns, changes in mental status. All other systems reviewed and are otherwise negative except as noted above.   Physical Exam    VS:  BP 110/72 (BP Location: Left Arm, Patient Position: Sitting, Cuff Size: Large)   Pulse 87   Ht '5\' 11"'$  (1.803 m)   Wt 245 lb 3.2 oz (111.2 kg)   SpO2 97%   BMI 34.20 kg/m  , BMI Body mass index is 34.2 kg/m.     GEN: Well nourished, well developed, in no acute distress. HEENT: normal. Neck: Supple, no JVD, carotid bruits, or masses. Cardiac: RRR, no murmurs, rubs, or gallops. No clubbing, cyanosis, trace edema.  Radials 2+/PT 2+ and equal bilaterally.  Respiratory:  Respirations regular and unlabored, clear with slightly diminished bases to auscultation bilaterally. GI: Soft, nontender, nondistended, BS + x 4. Extremity: Right radial cath site 2+ pulse without any bruising or hematoma noted.  Skin: warm and dry, no rash. Neuro:  Strength and sensation are intact. Psych: Normal affect.  Accessory Clinical Findings    ECG personally reviewed by me today- sinus rate of 87 with occasional PVC, left anterior fascicular block, left atrial enlargement- No acute changes  Lab Results  Component Value Date   WBC 12.8 (H) 05/13/2022   HGB 17.3 (H) 05/19/2022   HCT 51.0 05/19/2022   MCV 88.3 05/13/2022   PLT 222 05/13/2022   Lab Results  Component Value Date   CREATININE 1.11  05/27/2022   BUN 26 (H) 05/27/2022   NA 135 05/27/2022   K 3.8 05/27/2022   CL 102 05/27/2022   CO2 23 05/27/2022   Lab Results  Component Value Date   ALT 23 05/13/2022   AST 24 05/13/2022   ALKPHOS 73 05/13/2022   BILITOT 1.1 05/13/2022   Lab Results  Component Value Date   CHOL 104 05/13/2022   HDL 34 (L) 05/13/2022   LDLCALC 54 05/13/2022   LDLDIRECT 51 05/13/2022   TRIG 78 05/13/2022   CHOLHDL 3.1 05/13/2022    Lab Results  Component Value Date   HGBA1C 9.1 (H) 02/07/2021    Assessment & Plan   1.  HFrEF with LVEF 35-40% by visual estimate during [facility] completed on 05/19/22. Dyspnea is improving. Advised it is OK to restart his Ozempic since procedure is completed. He has been continued on jardiance, Toprol XL, and furosemide. He is being sent for a follow up BMP today to re-evaluate electrolytes and kidney function post procedure. Will continue to escalate GDMT as allowed by blood pressure and kidney function. Look at adding ARB on return. Encouraged to weigh daily at home, limit solidum intake and try avoiding fast foods and pre packaged pre processed foods.   2. Nonobstructive coronary artery disease on recent heart catheterization. Patient remains chest pain free today. No ischemic changes noted on EKG. He has been continued on asa and atorvastatin to prevent progression of nonobstructive disease. Encouraged to continue to work on risk factors and secondary prevention.  3. Hypertension with blood pressure today 110/72. Much improved from previous visit. Continued on current medication regimen without changes today.    4. Hyperlipidemia with LDL 54, with goal less than 70. Continued on atorvastatin 40 mg daily.  5. PVC continued on EKG. BMP ordered to re-evaluate electrolytes. He is asymptomatic. Will consider a three day Zio on return to assess PVC burden.  6. Disposition patient to return to clinic to see MD/APP in 6-8 weeks or sooner if needed  Zakiya Sporrer,  NP 05/28/2022, 5:49 PM

## 2022-05-28 ENCOUNTER — Encounter: Payer: Self-pay | Admitting: Cardiology

## 2022-06-03 ENCOUNTER — Telehealth: Payer: Self-pay | Admitting: *Deleted

## 2022-06-03 NOTE — Telephone Encounter (Signed)
Patient is returning call. Please advise  

## 2022-06-03 NOTE — Telephone Encounter (Signed)
Pt made aware of lab results along with NP's recommendations. Pt verbalized understanding.

## 2022-06-03 NOTE — Telephone Encounter (Signed)
Left voicemail message to call back for review of results.  

## 2022-06-03 NOTE — Telephone Encounter (Signed)
-----   Message from Gerrie Nordmann, NP sent at 05/28/2022  3:15 PM EST ----- Potassium level and kidney function are stable. Continue current medication regimen without changes needed at this time.

## 2022-06-08 ENCOUNTER — Other Ambulatory Visit: Payer: Self-pay | Admitting: Physician Assistant

## 2022-07-03 ENCOUNTER — Encounter: Payer: Self-pay | Admitting: Cardiology

## 2022-07-03 ENCOUNTER — Ambulatory Visit: Payer: Medicare PPO | Attending: Cardiology | Admitting: Cardiology

## 2022-07-03 NOTE — Progress Notes (Deleted)
Cardiology Office Note:   Date:  07/03/2022  ID:  Christopher Escobar, DOB 08/22/1955, MRN [id number]  History of Present Illness:   Christopher Escobar is a 67 y.o. male with a past medical history of coronary artery disease noted on CT imaging, HFrEF, PAD involving the aorta and iliac arteries, diabetes with peripheral neuropathy complicated by diabetic ulcer and prior amputations, hypertension, hyperlipidemia, prior tobacco use quitting in 2008, lumbar fusion, OSA who presents today for follow-up on his coronary artery disease.  He underwent ABIs that were normal in 06/2021.  CT of the abdomen in 10/2017 showed aortic atherosclerosis extending into the iliac arteries.  He underwent Lexiscan MPI in 02/2022 which showed no significant ischemia with a small fixed region of apical thinning, mild hypokinesis of the inferior lateral wall with an EF estimated at 37%.  CT corrected images showed three-vessel coronary artery calcification and mild aortic atherosclerosis.  Overall this is moderate risk study given reduced EF.  Repeat echocardiogram completed in 03/2022 demonstrated an EF of 45-50%, global hypokinesis, G2 DD, and mild mitral regurgitation.  He underwent a right and left heart catheterization for further diagnostics to determine treatment options on 05/19/2022 which revealed mild left ventricular systolic dysfunction, LV and systolic pressure was mildly to moderately elevated, the left ventricular ejection fraction of 35-40% by visual estimate, there was mild nonobstructive coronary artery disease, moderately reduced LV systolic function, right heart catheterization showed normal right arterial pressure, mild pulmonary hypertension, mildly elevated wedge pressure and normal cardiac output.  He was last seen in clinic on 05/27/2022 stating that overall his breathing has greatly improved.  He had no complications since the procedure and denied any chest pain or anginal equivalents.  He is continued on his current  medications without any changes.  I did discuss the possibility of wearing a ZIO monitor for short period of time to assess the PVC burden that he was having.  He returns to clinic today  ROS: ***  Studies Reviewed:    EKG:  ***  R/LHC 05/19/22   There is moderate left ventricular systolic dysfunction.   LV end diastolic pressure is moderately elevated.   The left ventricular ejection fraction is 35-45% by visual estimate.   1.  Mild nonobstructive coronary artery disease. 2.  Moderately reduced LV systolic function with an EF of 35 to 40% with global hypokinesis.  Moderately elevated left ventricular end-diastolic pressure. 3.  Right heart catheterization showed normal right atrial pressure, mild pulmonary hypertension, mildly elevated wedge pressure at 17 mmHg and normal cardiac output.   Recommendations: The patient has nonischemic cardiomyopathy for which I recommend medical therapy. Recommend advancing heart failure medications.  TTE 04/24/22 1. Left ventricular ejection fraction, by estimation, is 45 to 50%. The  left ventricle has mildly decreased function. The left ventricle  demonstrates global hypokinesis. Left ventricular diastolic parameters are  consistent with Grade II diastolic  dysfunction (pseudonormalization).   2. Right ventricular systolic function is normal. The right ventricular  size is normal. Tricuspid regurgitation signal is inadequate for assessing  PA pressure.   3. Left atrial size was moderately dilated.   4. The mitral valve is normal in structure. Mild mitral valve  regurgitation. No evidence of mitral stenosis.   5. The aortic valve is normal in structure. Aortic valve regurgitation is  mild. No aortic stenosis is present.   6. The inferior vena cava is normal in size with greater than 50%  respiratory variability, suggesting right atrial pressure of 3  mmHg.    Risk Assessment/Calculations:     No BP recorded.  {Refresh Note OR Click here to  enter BP  :1}***        Physical Exam:   VS:  There were no vitals taken for this visit.   Wt Readings from Last 3 Encounters:  05/27/22 245 lb 3.2 oz (111.2 kg)  05/19/22 256 lb (116.1 kg)  05/13/22 253 lb 9.6 oz (115 kg)     GEN: Well nourished, well developed in no acute distress NECK: No JVD; No carotid bruits CARDIAC: ***RRR, no murmurs, rubs, gallops RESPIRATORY:  Clear to auscultation without rales, wheezing or rhonchi  ABDOMEN: Soft, non-tender, non-distended EXTREMITIES:  No edema; No deformity   ASSESSMENT AND PLAN:   ***    {Are you ordering a CV Procedure (e.g. stress test, cath, DCCV, TEE, etc)?   Press F2        :[id number]   Signed, Dailey Alberson, NP

## 2022-07-04 ENCOUNTER — Other Ambulatory Visit: Payer: Self-pay | Admitting: Physician Assistant

## 2022-07-29 ENCOUNTER — Encounter: Payer: Self-pay | Admitting: Cardiology

## 2022-07-29 ENCOUNTER — Telehealth: Payer: Self-pay | Admitting: Cardiology

## 2022-07-29 ENCOUNTER — Ambulatory Visit: Payer: Medicare PPO | Attending: Cardiology | Admitting: Cardiology

## 2022-07-29 VITALS — BP 130/66 | HR 84 | Ht 71.0 in | Wt 254.8 lb

## 2022-07-29 DIAGNOSIS — I493 Ventricular premature depolarization: Secondary | ICD-10-CM

## 2022-07-29 DIAGNOSIS — I25118 Atherosclerotic heart disease of native coronary artery with other forms of angina pectoris: Secondary | ICD-10-CM

## 2022-07-29 DIAGNOSIS — I10 Essential (primary) hypertension: Secondary | ICD-10-CM | POA: Diagnosis not present

## 2022-07-29 DIAGNOSIS — I5020 Unspecified systolic (congestive) heart failure: Secondary | ICD-10-CM | POA: Diagnosis not present

## 2022-07-29 DIAGNOSIS — E785 Hyperlipidemia, unspecified: Secondary | ICD-10-CM | POA: Diagnosis not present

## 2022-07-29 DIAGNOSIS — Z794 Long term (current) use of insulin: Secondary | ICD-10-CM

## 2022-07-29 DIAGNOSIS — E118 Type 2 diabetes mellitus with unspecified complications: Secondary | ICD-10-CM

## 2022-07-29 DIAGNOSIS — I452 Bifascicular block: Secondary | ICD-10-CM

## 2022-07-29 MED ORDER — ENTRESTO 24-26 MG PO TABS: 1.0000 | ORAL_TABLET | Freq: Two times a day (BID) | ORAL | 3 refills | Status: DC

## 2022-07-29 MED ORDER — POTASSIUM CHLORIDE CRYS ER 20 MEQ PO TBCR: 20.0000 meq | EXTENDED_RELEASE_TABLET | Freq: Every day | ORAL | 3 refills | Status: DC

## 2022-07-29 MED ORDER — ENTRESTO 24-26 MG PO TABS: 1.0000 | ORAL_TABLET | Freq: Two times a day (BID) | ORAL | 11 refills | Status: DC

## 2022-07-29 NOTE — Telephone Encounter (Addendum)
Patient assistance application completed. The only thing needed is proof of income and then we can send to company for review. Patient aware of what is needed and to bring into office. He verbalized understanding.   Application placed in my Pending Patient Assistance folder on cart.

## 2022-07-29 NOTE — Progress Notes (Signed)
Cardiology Office Note:   Date:  07/29/2022  ID:  TEX CONROY, DOB 69/69/1957, MRN [id number]  History of Present Illness:   Christopher Escobar is a 67 y.o. male  with a past medical history of coronary artery disease noted on CT imaging, HFrEF, PAD involving the aorta and iliac arteries, diabetes with peripheral neuropathy complicated by diabetic ulcer and prior amputations, hypertension, hyperlipidemia, prior tobacco use quitting in 2008, lumbar fusion, OSA who presents today for follow-up on his coronary artery disease.   He underwent ABIs that were normal in 06/2021.  CT of the abdomen in 10/2017 showed aortic atherosclerosis extending into the iliac arteries.  He underwent Lexiscan MPI in 02/2022 which showed no significant ischemia with a small fixed region of apical thinning, mild hypokinesis of the inferior lateral wall with an EF estimated at 37%.  CT corrected images showed three-vessel coronary artery calcification and mild aortic atherosclerosis.  Overall this is moderate risk study given reduced EF.  Repeat echocardiogram completed in 03/2022 demonstrated an EF of 45-50%, global hypokinesis, G2 DD, and mild mitral regurgitation.  He underwent a right and left heart catheterization for further diagnostics to determine treatment options on 05/19/2022 which revealed mild left ventricular systolic dysfunction, LV and systolic pressure was mildly to moderately elevated, the left ventricular ejection fraction of 35-40% by visual estimate, there was mild nonobstructive coronary artery disease, moderately reduced LV systolic function, right heart catheterization showed normal right arterial pressure, mild pulmonary hypertension, mildly elevated wedge pressure and normal cardiac output.   He was last seen in clinic on 05/27/2022 stating that overall his breathing has greatly improved.  He had no complications since the procedure and denied any chest pain or anginal equivalents.  He is continued on his  current medications without any changes.  I did discuss the possibility of wearing a ZIO monitor for short period of time to assess the PVC burden that he was having.   He returns to clinic today stating that overall he has been doing fairly well.  His breathing has improved and he denies any chest discomfort.  He is concerned today as he has noted weight gain even with being on Ozempic and Jardiance.  He had last seen his primary care provider and asked for an increase in the dosage.  They have been monitoring his blood sugars and he is trying to keep up with the foods that he is eating.  Continues to be compliant with his medication regimen.  Denies any recent hospitalizations or visits to the emergency department.  ROS: 10 point review of systems has been reviewed is considered negative with exception of what is been listed in the HPI.  Studies Reviewed:    EKG: Sinus rhythm with a rate of 84, first-degree AV block, right bundle branch block, left anterior fascicular block, with unifocal PVCs  [facility] 05/19/22   There is moderate left ventricular systolic dysfunction.   LV end diastolic pressure is moderately elevated.   The left ventricular ejection fraction is 35-45% by visual estimate.   1.  Mild nonobstructive coronary artery disease. 2.  Moderately reduced LV systolic function with an EF of 35 to 40% with global hypokinesis.  Moderately elevated left ventricular end-diastolic pressure. 3.  Right heart catheterization showed normal right atrial pressure, mild pulmonary hypertension, mildly elevated wedge pressure at 17 mmHg and normal cardiac output.   Recommendations: The patient has nonischemic cardiomyopathy for which I recommend medical therapy. Recommend advancing heart failure medications.   TTE  04/24/22 1. Left ventricular ejection fraction, by estimation, is 45 to 50%. The  left ventricle has mildly decreased function. The left ventricle  demonstrates global hypokinesis. Left  ventricular diastolic parameters are  consistent with Grade II diastolic  dysfunction (pseudonormalization).   2. Right ventricular systolic function is normal. The right ventricular  size is normal. Tricuspid regurgitation signal is inadequate for assessing  PA pressure.   3. Left atrial size was moderately dilated.   4. The mitral valve is normal in structure. Mild mitral valve  regurgitation. No evidence of mitral stenosis.   5. The aortic valve is normal in structure. Aortic valve regurgitation is  mild. No aortic stenosis is present.   6. The inferior vena cava is normal in size with greater than 50%  respiratory variability, suggesting right atrial pressure of 3 mmHg.   Risk Assessment/Calculations:              Physical Exam:   VS:  BP 130/66 (BP Location: Left Arm, Patient Position: Sitting, Cuff Size: Normal)   Pulse 84   Ht 5\' 11"  (1.803 m)   Wt 254 lb 12.8 oz (115.6 kg)   SpO2 95%   BMI 35.54 kg/m    Wt Readings from Last 3 Encounters:  07/29/22 254 lb 12.8 oz (115.6 kg)  05/27/22 245 lb 3.2 oz (111.2 kg)  05/19/22 256 lb (116.1 kg)     GEN: Well nourished, well developed in no acute distress NECK: No JVD; No carotid bruits CARDIAC: RRR, no murmurs, rubs, gallops RESPIRATORY:  Clear to auscultation without rales, wheezing or rhonchi  ABDOMEN: Soft, non-tender, non-distended EXTREMITIES:  No edema; No deformity   ASSESSMENT AND PLAN:   HFrEF with LVEF of 35-40% by visual estimate her right and left heart catheterization completed on 05/19/2022.  Echocardiogram completed 1/20 reported shows LVEF of 45-50%.  He states his dyspnea is improving.  He has been started on Entresto 24/26 mg twice daily today as well as continued on Jardiance, Toprol-XL, and furosemide.  He will do BMP in 2 weeks to reevaluate kidney and electrolytes after starting Entresto.  He has been encouraged to continue to weigh daily at home, limit his sodium intake, and avoiding fast foods and  prepackaged preprocessed foods.  We also discussed that if he tolerates Entresto that we will repeat echocardiogram in approximately 3 months.  Nonobstructive coronary artery disease on recent catheterization.  Patient continues to remain chest pain-free today.  No ischemic changes noted on EKG.  He has been continued on aspirin 81 mg daily, and atorvastatin 40 mg daily.  Essential hypertension with blood pressure today 130/66.  He is encouraged to continue to monitor his blood pressures at home.  And is continued on his current medication regimen with initiation of Entresto.  Hyperlipidemia with LDL 54 goal is less than 70.  He is continued on atorvastatin.  Abnormal EKG with frequent PVCs, first-degree block, right bundle branch block, left anterior fascicular block, will continue to monitor with surveillance EKGs. Consider 3 day Zio on return to assess PVC burden.   IDDM with previous history of diabetic ulcer with previous prior toe amputation. Continued on Ozempic and jardiance and levemir, management per PCP.  Disposition patient to return to clinic to see MD/APP in 6 weeks or sooner if needed.         Signed, Timouthy Gilardi, NP

## 2022-07-29 NOTE — Patient Instructions (Addendum)
Medication Instructions:  Your physician has recommended you make the following change in your medication:   START Entresto 24-26 mg twice a day  *If you need a refill on your cardiac medications before your next appointment, please call your pharmacy*   Lab Work: BMET in 2 weeks. No appointment is needed. Just go to the [facility] entrance and check in at registration desk.   If you have labs (blood work) drawn today and your tests are completely normal, you will receive your results only by: MyChart Message (if you have MyChart) OR A paper copy in the mail If you have any lab test that is abnormal or we need to change your treatment, we will call you to review the results.   Testing/Procedures: None   Follow-Up: At [facility], you and your health needs are our priority.  As part of our continuing mission to provide you with exceptional heart care, we have created designated Provider Care Teams.  These Care Teams include your primary Cardiologist (physician) and Advanced Practice Providers (APPs -  Physician Assistants and Nurse Practitioners) who all work together to provide you with the care you need, when you need it.   Your next appointment:   6 week(s)  Provider:   Julien Nordmann, MD or Charlsie Quest, NP

## 2022-08-03 ENCOUNTER — Other Ambulatory Visit: Payer: Self-pay | Admitting: Physician Assistant

## 2022-08-06 ENCOUNTER — Telehealth: Payer: Self-pay | Admitting: Cardiovascular Disease

## 2022-08-06 MED ORDER — FUROSEMIDE 20 MG PO TABS: ORAL_TABLET | ORAL | 0 refills | Status: DC

## 2022-08-06 MED ORDER — ENTRESTO 24-26 MG PO TABS: 1.0000 | ORAL_TABLET | Freq: Two times a day (BID) | ORAL | 0 refills | Status: DC

## 2022-08-06 NOTE — Telephone Encounter (Signed)
*  STAT* If patient is at the pharmacy, call can be transferred to refill team.   1. Which medications need to be refilled? (please list name of each medication and dose if known)   sacubitril-valsartan (ENTRESTO) 24-26 MG  furosemide (LASIX) 20 MG tablet   2. Which pharmacy/location (including street and city if local pharmacy) is medication to be sent to?  [id number] - [place], [place] - [address] [address] AT [address]   3. Do they need a 30 day or 90 day supply?   90 days  Patient stated he has some of these medications left.

## 2022-08-06 NOTE — Telephone Encounter (Signed)
Requested Prescriptions   Signed Prescriptions Disp Refills   furosemide (LASIX) 20 MG tablet 90 tablet 0    Sig: TAKE 1 TABLET(20 MG) BY MOUTH DAILY    Authorizing Provider: HAMMOCK, SHERI    Ordering User: NEWCOMER MCCLAIN, Everest Hacking L   sacubitril-valsartan (ENTRESTO) 24-26 MG 180 tablet 0    Sig: Take 1 tablet by mouth 2 (two) times daily.    Authorizing Provider: HAMMOCK, SHERI    Ordering User: Thayer Headings, Travus Oren L

## 2022-08-12 ENCOUNTER — Other Ambulatory Visit
Admission: RE | Admit: 2022-08-12 | Discharge: 2022-08-12 | Disposition: A | Discharge status: Home / Self Care | Payer: Medicare PPO | Source: Ambulatory Visit | Attending: Cardiology | Admitting: Cardiology

## 2022-08-12 DIAGNOSIS — I5020 Unspecified systolic (congestive) heart failure: Secondary | ICD-10-CM | POA: Insufficient documentation

## 2022-08-12 DIAGNOSIS — E118 Type 2 diabetes mellitus with unspecified complications: Secondary | ICD-10-CM | POA: Insufficient documentation

## 2022-08-12 DIAGNOSIS — I25118 Atherosclerotic heart disease of native coronary artery with other forms of angina pectoris: Secondary | ICD-10-CM | POA: Diagnosis present

## 2022-08-12 DIAGNOSIS — Z794 Long term (current) use of insulin: Secondary | ICD-10-CM | POA: Insufficient documentation

## 2022-08-12 DIAGNOSIS — E785 Hyperlipidemia, unspecified: Secondary | ICD-10-CM

## 2022-08-12 DIAGNOSIS — I10 Essential (primary) hypertension: Secondary | ICD-10-CM | POA: Diagnosis present

## 2022-08-12 LAB — BASIC METABOLIC PANEL
Anion gap: 7 (ref 5–15)
BUN: 26 mg/dL — ABNORMAL HIGH (ref 8–23)
CO2: 24 mmol/L (ref 22–32)
Calcium: 8.7 mg/dL — ABNORMAL LOW (ref 8.9–10.3)
Chloride: 107 mmol/L (ref 98–111)
Creatinine, Ser: 1.21 mg/dL (ref 0.61–1.24)
GFR, Estimated: >60 mL/min (ref >60)
Glucose, Bld: 130 mg/dL — ABNORMAL HIGH (ref 70–99)
Potassium: 3.9 mmol/L (ref 3.5–5.1)
Sodium: 138 mmol/L (ref 135–145)

## 2022-08-12 NOTE — Progress Notes (Signed)
Kidney function and electrolytes remain stable.  Continue his current medication regimen.  No further changes at this time.

## 2022-08-15 ENCOUNTER — Other Ambulatory Visit: Payer: Self-pay

## 2022-08-15 MED ORDER — FUROSEMIDE 20 MG PO TABS: ORAL_TABLET | ORAL | 0 refills | Status: DC

## 2022-09-11 ENCOUNTER — Encounter: Payer: Self-pay | Admitting: Cardiology

## 2022-09-11 ENCOUNTER — Ambulatory Visit: Payer: Medicare HMO | Attending: Cardiology | Admitting: Cardiology

## 2022-09-11 VITALS — BP 100/60 | HR 81 | Ht 71.0 in | Wt 250.8 lb

## 2022-09-11 DIAGNOSIS — E785 Hyperlipidemia, unspecified: Secondary | ICD-10-CM | POA: Diagnosis not present

## 2022-09-11 DIAGNOSIS — I10 Essential (primary) hypertension: Secondary | ICD-10-CM | POA: Diagnosis not present

## 2022-09-11 DIAGNOSIS — E118 Type 2 diabetes mellitus with unspecified complications: Secondary | ICD-10-CM

## 2022-09-11 DIAGNOSIS — I25118 Atherosclerotic heart disease of native coronary artery with other forms of angina pectoris: Secondary | ICD-10-CM | POA: Diagnosis not present

## 2022-09-11 DIAGNOSIS — I5020 Unspecified systolic (congestive) heart failure: Secondary | ICD-10-CM | POA: Diagnosis not present

## 2022-09-11 DIAGNOSIS — R9431 Abnormal electrocardiogram [ECG] [EKG]: Secondary | ICD-10-CM

## 2022-09-11 DIAGNOSIS — Z794 Long term (current) use of insulin: Secondary | ICD-10-CM

## 2022-09-11 MED ORDER — FUROSEMIDE 20 MG PO TABS: ORAL_TABLET | ORAL | 0 refills | Status: DC

## 2022-09-11 NOTE — Progress Notes (Signed)
Cardiology Office Note:  .   Date:  09/11/2022  ID:  Christopher Escobar, DOB 1955/04/11, MRN [id number] PCP: Jaclyn Shaggy, MD  [facility] Providers Cardiologist:  Julien Nordmann, MD    History of Present Illness: .   Christopher Escobar is a 67 y.o. male with a past medical history of coronary artery disease noted on CT imaging, HFrEF, PAD involving the aorta and iliac arteries, diabetes with peripheral neuropathy complicated by diabetic ulcer prior amputations, hypertension, hyperlipidemia, prior tobacco use quitting in 2008, lumbar fusion, and OSA who presents today for follow-up of his coronary artery disease.  He had normal ABIs 06/2018.  CT of the abdomen in 10/2017 showed aortic atherosclerosis extending into the iliac arteries.  He underwent Lexiscan MPI in 12/23 which showed no significant ischemia with a small fixed region of apical thinning, mild hypokinesis of the inferior lateral wall with an EF estimated at 37%.  CT corrected images showed three-vessel coronary artery calcification and mild aortic atherosclerosis.  Overall moderate risk study due to reduced EF.  Repeat echocardiogram completed 1/24 demonstrated an EF of 45-50%, global hypokinesis, G2 DD, mild mitral regurgitation.  He underwent a right and left heart catheterization for further Lasix to determine treatment options in 05/19/2022 which revealed mild left ventricular systolic dysfunction, EF of 35-40% by visual assessment, and there was mild nonobstructive coronary artery disease, moderately reduced LV systolic function, right heart catheterization showed normal right arterial pressure, mild pulmonary hypertension, mildly elevated wedge pressure and normal cardiac output.  He was seen in clinic 07/29/2022 at that time he was doing fairly well.  Breathing improved chest discomfort.  He was discharged with weight gain even with being on Ozempic and Jardiance.  His primary care provider has been increasing the dosage.  Entresto  24/26 mg twice daily continue Jardiance, Toprol, furosemide, urostomy BMP in 2 weeks to reevaluate kidney function and electrolytes.  He was also noted PVCs on his EKG that he was asymptomatic to him will consider 3-day ZIO to assess his PVC burden if he became symptomatic or noted increase on surveillance EKGs.  He returns to clinic today stating overall that he has been doing well.  Continues to state that his weight does not change even though he has a 4 pound weight reduction noted today.  He has been compliant with his medications.  Blood work after starting Sherryll Burger was stable.  Denies any chest discomfort or shortness of breath or peripheral edema.  States that he has been monitoring his blood sugars and has been watching his dietary intake.  Denies any recent hospitalizations or visits to the emergency department  ROS: 10 point review of systems has been reviewed and considered negative with exception of what is been listed in the HPI  Studies Reviewed: Marland Kitchen    EKG: Sinus rhythm with a rate of 81, first-degree AV block, left anterior fascicular block, no acute change from prior studies  [facility] 05/19/22   There is moderate left ventricular systolic dysfunction.   LV end diastolic pressure is moderately elevated.   The left ventricular ejection fraction is 35-45% by visual estimate.   1.  Mild nonobstructive coronary artery disease. 2.  Moderately reduced LV systolic function with an EF of 35 to 40% with global hypokinesis.  Moderately elevated left ventricular end-diastolic pressure. 3.  Right heart catheterization showed normal right atrial pressure, mild pulmonary hypertension, mildly elevated wedge pressure at 17 mmHg and normal cardiac output.   Recommendations: The patient has  nonischemic cardiomyopathy for which I recommend medical therapy. Recommend advancing heart failure medications.   TTE 04/24/22 1. Left ventricular ejection fraction, by estimation, is 45 to 50%. The  left  ventricle has mildly decreased function. The left ventricle  demonstrates global hypokinesis. Left ventricular diastolic parameters are  consistent with Grade II diastolic  dysfunction (pseudonormalization).   2. Right ventricular systolic function is normal. The right ventricular  size is normal. Tricuspid regurgitation signal is inadequate for assessing  PA pressure.   3. Left atrial size was moderately dilated.   4. The mitral valve is normal in structure. Mild mitral valve  regurgitation. No evidence of mitral stenosis.   5. The aortic valve is normal in structure. Aortic valve regurgitation is  mild. No aortic stenosis is present.   6. The inferior vena cava is normal in size with greater than 50%  respiratory variability, suggesting right atrial pressure of 3 mmHg.  Risk Assessment/Calculations:             Physical Exam:   VS:  BP 100/60 (BP Location: Left Arm, Patient Position: Sitting, Cuff Size: Large)   Pulse 81   Ht 5\' 11"  (1.803 m)   Wt 250 lb 12.8 oz (113.8 kg)   SpO2 96%   BMI 34.98 kg/m    Wt Readings from Last 3 Encounters:  09/11/22 250 lb 12.8 oz (113.8 kg)  07/29/22 254 lb 12.8 oz (115.6 kg)  05/27/22 245 lb 3.2 oz (111.2 kg)    GEN: Well nourished, well developed in no acute distress NECK: No JVD; No carotid bruits CARDIAC: RRR, no murmurs, rubs, gallops RESPIRATORY:  Clear to auscultation without rales, wheezing or rhonchi  ABDOMEN: Soft, non-tender, non-distended EXTREMITIES:  No edema; No deformity   ASSESSMENT AND PLAN: .   HFimpEF with a last LVEF of 45-50% on 03/2022.  Previous echocardiogram revealed an LVEF of 35-40% by visual assessment on his right and left heart catheterization.  Dyspnea has been improving.  He is 4 pounds down today.  Appears to be euvolemic on exam.  He is continued on Entresto 24/26 mg twice daily, Toprol-XL 25 mg daily, Jardiance 25 mg daily, and his furosemide is being changed to as needed basis.  He is also being scheduled  for limited echocardiogram at the end of August since being started on Entresto to reevaluate EF.  Nonobstructive coronary artery disease on recent catheterization.  Continues to remain chest pain-free without any signs of angina.  No ischemic changes noted on EKG today as well.  He has been continued on aspirin 81 mg daily and atorvastatin 40 mg daily.  Essential hypertension with blood pressure today 100/60.  He is continued on his current medication regimen without changes today with exception of his Lasix being changed to as needed.  Is encouraged to continue to monitor his pressures at home as well.  Mixed hyperlipidemia with his last LDL of 54 which is less than goal.  He is continued on his atorvastatin.  Insulin-dependent diabetes with previous history of diabetic ulcer with previous prior to amputations.  He is continued on Ozempic, Jardiance, and Levemir.  Management per his PCP.  Abnormal EKG with decreased amount of PVCs noted today continues to have first-degree AV block and a left anterior fascicular block.  Will continue to monitor with surveillance EKGs.       Dispo: Patient return to clinic to see MD/APP in 3 months or sooner if needed to follow-up after limited echocardiogram.  No refills on  medications are needed today.  Signed, Crystalann Korf, NP

## 2022-09-11 NOTE — Patient Instructions (Signed)
Medication Instructions:  Start taking lasix 20 mg daily as needed.  *If you need a refill on your cardiac medications before your next appointment, please call your pharmacy*   Lab Work: None ordered If you have labs (blood work) drawn today and your tests are completely normal, you will receive your results only by: MyChart Message (if you have MyChart) OR A paper copy in the mail If you have any lab test that is abnormal or we need to change your treatment, we will call you to review the results.   Testing/Procedures: ECHO in August Your physician has requested that you have an echocardiogram. Echocardiography is a painless test that uses sound waves to create images of your heart. It provides your doctor with information about the size and shape of your heart and how well your heart's chambers and valves are working.   You may receive an ultrasound enhancing agent through an IV if needed to better visualize your heart during the echo. This procedure takes approximately one hour.  There are no restrictions for this procedure.  This will take place at [address], [place] [address]    Follow-Up: At [facility], you and your health needs are our priority.  As part of our continuing mission to provide you with exceptional heart care, we have created designated Provider Care Teams.  These Care Teams include your primary Cardiologist (physician) and Advanced Practice Providers (APPs -  Physician Assistants and Nurse Practitioners) who all work together to provide you with the care you need, when you need it.  We recommend signing up for the patient portal called "MyChart".  Sign up information is provided on this After Visit Summary.  MyChart is used to connect with patients for Virtual Visits (Telemedicine).  Patients are able to view lab/test results, encounter notes, upcoming appointments, etc.  Non-urgent messages can be sent to your  provider as well.   To learn more about what you can do with MyChart, go to [email or web address].    Your next appointment:   3 month(s)  Provider:   You may see Julien Nordmann, MD or one of the following Advanced Practice Providers on your designated Care Team:   Nicolasa Ducking, NP Eula Listen, PA-C Cadence Fransico Michael, PA-C Charlsie Quest, NP

## 2022-09-12 ENCOUNTER — Ambulatory Visit: Payer: Medicare HMO | Admitting: Podiatrist

## 2022-09-12 ENCOUNTER — Encounter: Payer: Self-pay | Admitting: Podiatrist

## 2022-09-12 DIAGNOSIS — M2041 Other hammer toe(s) (acquired), right foot: Secondary | ICD-10-CM

## 2022-09-12 DIAGNOSIS — L97511 Non-pressure chronic ulcer of other part of right foot limited to breakdown of skin: Secondary | ICD-10-CM | POA: Diagnosis not present

## 2022-09-12 DIAGNOSIS — E1142 Type 2 diabetes mellitus with diabetic polyneuropathy: Secondary | ICD-10-CM | POA: Diagnosis not present

## 2022-09-12 DIAGNOSIS — M2042 Other hammer toe(s) (acquired), left foot: Secondary | ICD-10-CM

## 2022-09-12 NOTE — Progress Notes (Unsigned)
Chief Complaint  Patient presents with   Wound Check    "I got a sore on top of my big toe." N - ulcer L - dorsal hallux right D - 2 days O - suddenly C - sore A - none T - betadine, alcohol, bandage, Santyl, blue foam from [facility]     HPI: Patient is 67 y.o. male who presents today for a sore spot that has occurred on the dorsum of the right hallux.  He relates it has been present for 2 days and he has been applying santyl and blue foam he has from a recent wound that has healed.  No signs or symptoms of infection noted or reported.     No Known Allergies  Review of systems is negative except as noted in the HPI.  Denies nausea/ vomiting/ fevers/ chills or night sweats.   Denies difficulty breathing, denies calf pain or tenderness  Physical Exam  Patient is awake, alert, and oriented x 3.  In no acute distress.    Vascular status is intact with palpable pedal pulses DP and PT bilateral and capillary refill time less than 3 seconds bilateral.  No edema or erythema noted.   Neurological exam reveals epicritic and protective sensation grossly decreased bilateral.   Dermatological exam reveals skin is supple and dry to bilateral feet.   Superficial ulceration dorsum of right hallux noted.  Measures 0.5cm in diameter with a red, granular base.  Slight amount of hyperkeratotic tissue periwound noted. No probing to bone, no undermining, no tunnelling, no sign of infection noted.        Assessment:   ICD-10-CM   1. Diabetic polyneuropathy associated with type 2 diabetes mellitus (HCC)  E11.42     2. Ulcer of great toe, right, limited to breakdown of skin (HCC)  L97.511     3. Hammertoe, bilateral  M20.41    M20.42         Plan: Treatment options and recommendations discussed.  The ulcer is superficial and there is a small rim of hyperkeratotic tissue that is debrided.  I applied iodosorb and a dressing and recommended use of santyl in combination with blue foam at home  after cleansing daily.  Also recommended a shoe with no pressure on this area until it heals.  Current diabetic shoes are evaluated and its time for a new pair-    likely needs new diabetic shoes with mesh upper.  Will make a return appt in 2-3 weeks for recheck of the ulceration.

## 2022-09-12 NOTE — Patient Instructions (Signed)
DRESSING CHANGES RIGHT FOOT:       CLEANSE ULCER WITH SALINE or soapy water   DAB DRY WITH GAUZE SPONGE.   APPLY A LIGHT AMOUNT OF Santyl TO BASE OF ULCER-  use this at night.  During the day, apply the foam dressing and cover.   DO NOT WALK BAREFOOT!!!   IF YOU EXPERIENCE ANY FEVER, CHILLS, NIGHTSWEATS, NAUSEA OR VOMITING, ELEVATED OR LOW BLOOD SUGARS, REPORT TO EMERGENCY ROOM.   IF YOU EXPERIENCE INCREASED REDNESS, PAIN, SWELLING, DISCOLORATION, ODOR, PUS, DRAINAGE OR WARMTH OF YOUR FOOT, REPORT TO EMERGENCY ROOM.

## 2022-10-01 ENCOUNTER — Ambulatory Visit: Payer: Medicare HMO | Admitting: Podiatry

## 2022-10-06 ENCOUNTER — Ambulatory Visit (INDEPENDENT_AMBULATORY_CARE_PROVIDER_SITE_OTHER): Payer: Medicare HMO | Admitting: Podiatrist

## 2022-10-06 ENCOUNTER — Encounter: Payer: Self-pay | Admitting: Podiatrist

## 2022-10-06 DIAGNOSIS — L97522 Non-pressure chronic ulcer of other part of left foot with fat layer exposed: Secondary | ICD-10-CM

## 2022-10-06 DIAGNOSIS — E1142 Type 2 diabetes mellitus with diabetic polyneuropathy: Secondary | ICD-10-CM

## 2022-10-06 DIAGNOSIS — E08621 Diabetes mellitus due to underlying condition with foot ulcer: Secondary | ICD-10-CM

## 2022-10-06 DIAGNOSIS — L03032 Cellulitis of left toe: Secondary | ICD-10-CM

## 2022-10-06 DIAGNOSIS — M2042 Other hammer toe(s) (acquired), left foot: Secondary | ICD-10-CM

## 2022-10-06 DIAGNOSIS — M2041 Other hammer toe(s) (acquired), right foot: Secondary | ICD-10-CM

## 2022-10-06 NOTE — Progress Notes (Signed)
Patient presents today to MEASURED FOR diabetic shoes and insoles.  Patient was CASTED 1 pair of diabetic shoes and 3 pairs of foam casted diabetic insoles.   HT 5'11 WT 245 SHOE SIZE 10XW SHOE TYPE RHINO RUNNER   TREATING PHYSICIAN DENNY TATE   PICK UP IN [place]   Re-appointment for regularly scheduled diabetic foot care visits or if they should experience any trouble with the shoes or insoles.

## 2022-10-22 ENCOUNTER — Encounter: Payer: Self-pay | Admitting: Cardiovascular Disease

## 2022-10-29 ENCOUNTER — Telehealth: Payer: Self-pay | Admitting: Podiatry

## 2022-10-29 NOTE — Telephone Encounter (Signed)
Lmom for pt to call back to schedule picking up diabetic shoes - shoes are in [place]

## 2022-11-05 NOTE — Telephone Encounter (Signed)
Lmom for pt to call back to schedule picking up diabetic shoes   

## 2022-11-11 ENCOUNTER — Ambulatory Visit: Payer: Medicare HMO | Admitting: Urology

## 2022-11-11 NOTE — Telephone Encounter (Signed)
Lmom for patient to call back to schedule picking up  diabetic shoes -3rd attempt

## 2022-11-13 ENCOUNTER — Ambulatory Visit: Payer: Medicare HMO | Attending: Cardiology

## 2022-11-17 NOTE — Telephone Encounter (Signed)
Left message and sent message through mychart 4th attemp - will send shoes back if no response by  12/05/22

## 2022-11-26 ENCOUNTER — Ambulatory Visit: Payer: Medicare HMO | Admitting: Urology

## 2022-12-04 ENCOUNTER — Telehealth: Payer: Self-pay | Admitting: Cardiovascular Disease

## 2022-12-04 NOTE — Telephone Encounter (Signed)
Left voicemail, pt's appt on 9/13 needs to be rescheduled due to provider being out of office on that day. Pt can be rescheduled to see Dr Mariah Milling or Charlsie Quest, NP.

## 2022-12-05 ENCOUNTER — Ambulatory Visit: Payer: Medicare HMO

## 2022-12-05 NOTE — Progress Notes (Signed)
Shoes were tried on Right shoe needs spot stretching in bunion area I will leave Left shoe and inserts in [facility] office patient will stop in on 9/20 to pu before 2pm   Addison Bailey CPed, CFo, CFm

## 2022-12-09 ENCOUNTER — Ambulatory Visit: Payer: Medicare HMO | Admitting: Urology

## 2022-12-10 ENCOUNTER — Ambulatory Visit: Payer: Medicare HMO | Attending: Cardiology

## 2022-12-10 DIAGNOSIS — I5020 Unspecified systolic (congestive) heart failure: Secondary | ICD-10-CM

## 2022-12-10 LAB — ECHOCARDIOGRAM LIMITED

## 2022-12-10 MED ORDER — PERFLUTREN LIPID MICROSPHERE
1.0000 mL | INTRAVENOUS | Status: AC | PRN
Start: 2022-12-10 — End: 2022-12-10
  Administered 2022-12-10: 3 mL via INTRAVENOUS

## 2022-12-12 ENCOUNTER — Ambulatory Visit: Payer: Medicare HMO | Admitting: Cardiovascular Disease

## 2022-12-17 NOTE — Progress Notes (Signed)
Heart squeeze was noted at 35-40% which is little bit lower than before.  No valvular abnormalities were noted.  Continue on all heart failure medications with adjustments to be made at follow-up with Dr. Mariah Milling at your appointment on 01/20/2023.

## 2022-12-19 ENCOUNTER — Ambulatory Visit: Payer: Medicare HMO

## 2022-12-19 NOTE — Progress Notes (Signed)

## 2022-12-23 ENCOUNTER — Encounter: Payer: Self-pay | Admitting: Urology

## 2022-12-23 ENCOUNTER — Other Ambulatory Visit
Admission: RE | Admit: 2022-12-23 | Discharge: 2022-12-23 | Disposition: A | Discharge status: Home / Self Care | Payer: Medicare HMO | Attending: Urology | Admitting: Urology

## 2022-12-23 ENCOUNTER — Ambulatory Visit: Payer: Medicare HMO | Admitting: Urology

## 2022-12-23 VITALS — BP 149/84 | HR 93 | Ht 71.0 in | Wt 251.0 lb

## 2022-12-23 DIAGNOSIS — N529 Male erectile dysfunction, unspecified: Secondary | ICD-10-CM | POA: Insufficient documentation

## 2022-12-23 DIAGNOSIS — C61 Malignant neoplasm of prostate: Secondary | ICD-10-CM | POA: Diagnosis present

## 2022-12-23 DIAGNOSIS — Z8546 Personal history of malignant neoplasm of prostate: Secondary | ICD-10-CM

## 2022-12-23 DIAGNOSIS — R399 Unspecified symptoms and signs involving the genitourinary system: Secondary | ICD-10-CM

## 2022-12-23 LAB — PSA: Prostatic Specific Antigen: 0.02 ng/mL (ref 0.00–4.00)

## 2022-12-23 MED ORDER — TADALAFIL 20 MG PO TABS: 20.0000 mg | ORAL_TABLET | Freq: Every day | ORAL | 6 refills | Status: AC | PRN

## 2022-12-23 MED ORDER — UNABLE TO FIND: 0 refills | Status: DC

## 2022-12-23 MED ORDER — TAMSULOSIN HCL 0.4 MG PO CAPS: 0.4000 mg | ORAL_CAPSULE | Freq: Every day | ORAL | 3 refills | Status: DC

## 2022-12-23 NOTE — Patient Instructions (Addendum)
Please obtain your records from Dr. Evelene Croon by calling [facility] Records Management at [phone]. Once you have received your records you may have them dropped off at our office or you may have them emailed to [place] at [email or web address]. Please be aware there is a $35 fee charged by [facility].                                Trimix Injection Training  The provider will prescribe the Trimix medication to [facility].  You will need to pick up the medication at:   [address]  [place], [place] [address] [phone]  Once you have the medication in hand you will need to call our office to schedule a morning ONLY appointment to have injection teaching. The medication will induce an erection. The medication is estimated at $80.00. This medication is time sensitive as it expires quickly.   You will need to have a normal blood pressure in order to be injected. Your blood pressure must be under 140/90. We will check your blood pressure multiple times to ensure the reading is correct, if you BP continues to be elevated we will need to reschedule your appointment.    Nocturia refers to the need to wake up during the night to urinate, which can disrupt your sleep and impact your overall well-being. Fortunately, there are several strategies you can employ to help prevent or manage nocturia. It's important to consult with your healthcare provider before making any significant changes to your routine. Here are some helpful strategies to consider:  Limit Fluid Intake Before Bed: Avoid drinking large amounts of fluids in the evening, especially within a few hours of bedtime. Consume most of your daily fluid intake earlier in the day to reduce the need to urinate at night.  Monitor Your Diet: Limit your intake of caffeine and alcohol, as these substances can increase urine production and irritate the bladder.  Avoid diet, "zero calorie," and artificially  sweetened drinks, especially sodas, in the afternoon or evening. Be mindful of consuming foods and drinks with high water content before bedtime, such as watermelon and herbal teas.  Time Your Medications: If you're taking medications that contribute to increased urination, consult your healthcare provider about adjusting the timing of these medications to minimize their impact during the night.  Practice Double Voiding: Before going to bed, make an effort to empty your bladder twice within a short period. This can help reduce the amount of urine left in your bladder before sleep.  Bladder Training: Gradually increase the time between bathroom visits during the day to train your bladder to hold larger volumes of urine. Over time, this can help reduce the frequency of nighttime awakenings to urinate.  Elevate Your Legs During the Day: Elevating your legs during the day can help minimize fluid retention in your lower extremities, which might reduce nighttime urination.  Pelvic Floor Exercises: Strengthening your pelvic floor muscles through Kegel exercises can help improve bladder control and potentially reduce the urge to urinate at night.  Create a Relaxing Bedtime Routine: Stress and anxiety can exacerbate nocturia. Engage in calming activities before bed, such as reading, listening to soothing music, or practicing relaxation techniques.  Stay Active: Engage in regular physical activity, but avoid intense exercise close to bedtime, as this can increase your body's demand for fluids.  Maintain a Healthy Weight: Excess weight can compress the bladder and contribute to bladder  and urinary issues. Aim to achieve and maintain a healthy weight through a balanced diet and regular exercise.  Remember that every individual is unique, and the effectiveness of these strategies may vary. It's important to work with your healthcare provider to develop a plan that suits your specific needs and  addresses any underlying causes of nocturia.

## 2022-12-23 NOTE — Addendum Note (Signed)
Addended by: Sueanne Margarita on: 12/23/2022 02:55 PM   Modules accepted: Orders

## 2022-12-23 NOTE — Progress Notes (Signed)
12/23/22 2:48 PM   Christopher Escobar [birth year] [id number]  CC: History of prostate cancer, ED, urinary symptoms  HPI: Comorbid 67 year old male with CAD, diabetes, CHF, with reported history of prostate cancer treated by Dr. Sheppard Penton in 2019.  Unfortunately, those records are not available to me, it sounds like this was potentially brachytherapy, but Gleason score unknown.  He thinks his PSAs were normal after that, but again no PSAs are available to me at this point.  He has had ED since the procedure.  He reportedly was trialed on Cialis and Viagra with no improvement.  He does not remember which dose to use.  He would be interested in potentially penile injections.  He has urinary symptoms of some weak stream as well as nocturia.  Currently on Flomax which he feels helps.  He has sleep apnea but noncompliant with CPAP.   PMH: Past Medical History:  Diagnosis Date   CAD (coronary artery disease)    Diabetes mellitus with complication (HCC) 2010   HFrEF (heart failure with reduced ejection fraction) (HCC)    Hypertension 2008   PAD (peripheral artery disease) (HCC)    Personal history of colonic polyps    TA polyp   Sleep apnea    not using cpap    Surgical History: Past Surgical History:  Procedure Laterality Date   AMPUTATION Right 09/15/2019   Procedure: AMPUTATION RIGHT FOOT 2ND DIGIT;  Surgeon: Candelaria Stagers, DPM;  Location: [facility];  Service: Podiatry;  Laterality: Right;   AMPUTATION Left 02/08/2021   Procedure: AMPUTATION RAY-Partial 5th Ray;  Surgeon: Felecia Shelling, DPM;  Location: [facility] ORS;  Service: Podiatry;  Laterality: Left;   BACK SURGERY  1989   COLONOSCOPY  2013   COLONOSCOPY WITH PROPOFOL N/A 10/28/2016   Procedure: COLONOSCOPY WITH PROPOFOL;  Surgeon: Earline Mayotte, MD;  Location: [facility];  Service: Endoscopy;  Laterality: N/A;   RIGHT/LEFT HEART CATH AND CORONARY ANGIOGRAPHY Bilateral 05/19/2022   Procedure: RIGHT/LEFT HEART CATH AND CORONARY  ANGIOGRAPHY;  Surgeon: Iran Ouch, MD;  Location: [facility];  Service: Cardiovascular;  Laterality: Bilateral;   SHOULDER ARTHROSCOPY WITH SUBACROMIAL DECOMPRESSION Left 04/02/2017   Procedure: SHOULDER ARTHROSCOPY WITH DECOMPRESSION, DEBRIDEMENT, SLAP REPAIR,;  Surgeon: Christena Flake, MD;  Location: [facility] OR[facility];  Service: Orthopedics;  Laterality: Left;   SHOULDER SURGERY Bilateral 2011,2012   Family History: Family History  Problem Relation Age of Onset   Heart attack Father 61   Heart attack Brother 55   Heart attack Brother 7    Social History:  reports that he quit smoking about 15 years ago. His smoking use included cigarettes. He started smoking about 45 years ago. He has a 30 pack-year smoking history. He has been exposed to tobacco smoke. He has never used smokeless tobacco. He reports current alcohol use. He reports that he does not use drugs.  Physical Exam: BP (!) 149/84   Pulse 93   Ht 5\' 11"  (1.803 m)   Wt 251 lb (113.9 kg)   BMI 35.01 kg/m    Constitutional:  Alert and oriented, No acute distress. Cardiovascular: No clubbing, cyanosis, or edema. Respiratory: Normal respiratory effort, no increased work of breathing. GI: Abdomen is soft, nontender, nondistended, no abdominal masses  Assessment & Plan:   67 year old male with a number of comorbidities, reportedly treated for prostate cancer by Dr. Sheppard Penton in 2019 with radiation, those records are not available to me.  We discussed the importance of monitoring the  PSA, will draw PSA today and call with results.  In terms of ED he was interested in a trial of max dose Cialis, as well as a test dose of Trimix with follow-up for teaching if no improvement on the Cialis.  Regarding urinary symptoms, recommended continuing Flomax, avoiding bladder irritants as he consumes a high amount of diet sodas, and using CPAP for sleep apnea to improve nocturia.  Trial of Cialis 20 mg max dose, also sent in test dose for  Trimix for teaching if no improvement Will try to obtain outside Dr. Sheppard Penton urology records PSA today, call with results    Legrand Rams, MD 12/23/2022  [facility] [address] [place], [place] [address] [phone]

## 2022-12-31 ENCOUNTER — Other Ambulatory Visit: Payer: Self-pay | Admitting: Cardiology

## 2023-01-02 ENCOUNTER — Other Ambulatory Visit: Payer: Self-pay

## 2023-01-02 DIAGNOSIS — C61 Malignant neoplasm of prostate: Secondary | ICD-10-CM

## 2023-01-02 DIAGNOSIS — R399 Unspecified symptoms and signs involving the genitourinary system: Secondary | ICD-10-CM

## 2023-01-02 MED ORDER — TAMSULOSIN HCL 0.4 MG PO CAPS: 0.4000 mg | ORAL_CAPSULE | Freq: Every day | ORAL | 3 refills | Status: AC

## 2023-01-02 NOTE — Progress Notes (Unsigned)
01/05/2023 8:47 AM   Christopher Escobar 1955-11-20 [id number]  Referring provider: Jaclyn Shaggy, MD [address]   [place],  [place] [address]  Urological history: 1.  Erectile dysfunction -Contributing factors of age, hypertension, heart failure, CAD, PAD, sleep apnea, hyperlipidemia, diabetes, alcohol consumption and history of smoking. -Failed PDE5 inhibitors  2.  Prostate cancer -PSA (0.02)  -?  Treated with Dr. Evelene Croon with likely brachytherapy, but Gleason score unknown  3. BPH with LU TS -Tamsulosin 0.4 mg daily  Chief Complaint  Patient presents with   Erectile Dysfunction   HPI: Christopher Escobar is a 67 y.o. male who presents today for erectile dysfunction.   Previous records reviewed.   He is no longer having spontaneous erections.  He denied any pain with erections.  He denied any curvatures with erections.  He did not bring the needles or enough of the syringes to start the titration.   PMH: Past Medical History:  Diagnosis Date   CAD (coronary artery disease)    Diabetes mellitus with complication (HCC) 2010   HFrEF (heart failure with reduced ejection fraction) (HCC)    Hypertension 2008   PAD (peripheral artery disease) (HCC)    Personal history of colonic polyps    TA polyp   Sleep apnea    not using cpap    Surgical History: Past Surgical History:  Procedure Laterality Date   AMPUTATION Right 09/15/2019   Procedure: AMPUTATION RIGHT FOOT 2ND DIGIT;  Surgeon: Candelaria Stagers, DPM;  Location: [facility];  Service: Podiatry;  Laterality: Right;   AMPUTATION Left 02/08/2021   Procedure: AMPUTATION RAY-Partial 5th Ray;  Surgeon: Felecia Shelling, DPM;  Location: [facility];  Service: Podiatry;  Laterality: Left;   BACK SURGERY  1989   COLONOSCOPY  2013   COLONOSCOPY WITH PROPOFOL N/A 10/28/2016   Procedure: COLONOSCOPY WITH PROPOFOL;  Surgeon: Earline Mayotte, MD;  Location: [facility];  Service: Endoscopy;  Laterality: N/A;   RIGHT/LEFT HEART  CATH AND CORONARY ANGIOGRAPHY Bilateral 05/19/2022   Procedure: RIGHT/LEFT HEART CATH AND CORONARY ANGIOGRAPHY;  Surgeon: Iran Ouch, MD;  Location: [facility];  Service: Cardiovascular;  Laterality: Bilateral;   SHOULDER ARTHROSCOPY WITH SUBACROMIAL DECOMPRESSION Left 04/02/2017   Procedure: SHOULDER ARTHROSCOPY WITH DECOMPRESSION, DEBRIDEMENT, SLAP REPAIR,;  Surgeon: Christena Flake, MD;  Location: [facility];  Service: Orthopedics;  Laterality: Left;   SHOULDER SURGERY Bilateral 2011,2012    Home Medications:  Allergies as of 01/05/2023   No Known Allergies      Medication List        Accurate as of January 05, 2023  8:47 AM. If you have any questions, ask your nurse or doctor.          acetaminophen 325 MG tablet Commonly known as: TYLENOL Take 2 tablets (650 mg total) by mouth every 6 (six) hours as needed for mild pain (or Fever >/= 101).   aspirin EC 81 MG tablet Take 1 tablet (81 mg total) by mouth daily. Swallow whole.   atorvastatin 40 MG tablet Commonly known as: LIPITOR Take 40 mg by mouth daily at 6 PM.   Entresto 24-26 MG Generic drug: sacubitril-valsartan Take 1 tablet by mouth 2 (two) times daily.   insulin detemir 100 UNIT/ML injection Commonly known as: LEVEMIR Inject 60 Units into the skin at bedtime.   Jardiance 25 MG Tabs tablet Generic drug: empagliflozin Take 25 mg by mouth daily.   METAMUCIL PO Take by  mouth daily.   metoprolol succinate 25 MG 24 hr tablet Commonly known as: TOPROL-XL Take 25 mg every morning by mouth.   multivitamin-lutein Caps capsule Take 1 capsule by mouth 2 (two) times daily.   NovoLOG FlexPen 100 UNIT/ML FlexPen Generic drug: insulin aspart Inject 15 Units into the skin 2 (two) times daily before a meal.   oxyCODONE 15 MG immediate release tablet Commonly known as: ROXICODONE Take 15 mg by mouth every 6 (six) hours as needed.   Ozempic (2 MG/DOSE) 8 MG/3ML Sopn Generic drug: Semaglutide (2  MG/DOSE) Inject into the skin once a week. What changed: Another medication with the same name was removed. Continue taking this medication, and follow the directions you see here. Changed by: Michiel Cowboy   potassium chloride SA 20 MEQ tablet Commonly known as: KLOR-CON M Take 1 tablet (20 mEq total) by mouth daily.   pregabalin 75 MG capsule Commonly known as: LYRICA Take 75 mg by mouth 2 (two) times daily.   senna 8.6 MG Tabs tablet Commonly known as: SENOKOT Take 2 tablets (17.2 mg total) by mouth at bedtime.   tadalafil 20 MG tablet Commonly known as: CIALIS Take 1 tablet (20 mg total) by mouth daily as needed (take 45 minutes prior to sexual activity).   tamsulosin 0.4 MG Caps capsule Commonly known as: FLOMAX Take 1 capsule (0.4 mg total) by mouth daily.   UNABLE TO FIND Med Name: Trimix (30/1/10)-(Pap/Phent/PGE)  Test Dose  1ml vial   Qty #3 Refills 0  [facility] [phone] Fax [phone]        Allergies: No Known Allergies  Family History: Family History  Problem Relation Age of Onset   Heart attack Father 92   Heart attack Brother 53   Heart attack Brother 51    Social History:  reports that he quit smoking about 15 years ago. His smoking use included cigarettes. He started smoking about 45 years ago. He has a 30 pack-year smoking history. He has been exposed to tobacco smoke. He has never used smokeless tobacco. He reports current alcohol use. He reports that he does not use drugs.  ROS: Pertinent ROS in HPI  Physical Exam: BP 122/75 (BP Location: Left Arm, Patient Position: Sitting, Cuff Size: Normal)   Pulse 87   Ht 5\' 11"  (1.803 m)   Wt 251 lb (113.9 kg)   BMI 35.01 kg/m   Constitutional:  Well nourished. Alert and oriented, No acute distress.  Laboratory Data: Lab Results  Component Value Date   WBC 12.8 (H) 05/13/2022   HGB 17.3 (H) 05/19/2022   HCT 51.0 05/19/2022   MCV 88.3 05/13/2022   PLT 222 05/13/2022     Lab Results  Component Value Date   CREATININE 1.21 08/12/2022       Component Value Date/Time   CHOL 104 05/13/2022 1502   HDL 34 (L) 05/13/2022 1502   CHOLHDL 3.1 05/13/2022 1502   VLDL 16 05/13/2022 1502   LDLCALC 54 05/13/2022 1502    Lab Results  Component Value Date   AST 24 05/13/2022   Lab Results  Component Value Date   ALT 23 05/13/2022  I have reviewed the labs.   Pertinent Imaging: N/A  Assessment & Plan:    1. Erectile dysfunction - reschedule titration appointment   2. BPH with LU TS -continue tamsulosin 0.4 mg daily   3. Prostate cancer -recent PSA at nadir -schedule follow up in 6 months   Return for next week for  titration .  These notes generated with voice recognition software. I apologize for typographical errors.  Cloretta Ned  [facility] [address]  [address] [place], [place] [address] [phone]

## 2023-01-02 NOTE — Telephone Encounter (Signed)
Pt requests flomax be sent to mail order pharmacy. RX sent.

## 2023-01-05 ENCOUNTER — Ambulatory Visit: Payer: Medicare HMO | Admitting: Urology

## 2023-01-05 ENCOUNTER — Encounter: Payer: Self-pay | Admitting: Urology

## 2023-01-05 ENCOUNTER — Telehealth: Payer: Self-pay | Admitting: Cardiovascular Disease

## 2023-01-05 VITALS — BP 122/75 | HR 87 | Ht 71.0 in | Wt 251.0 lb

## 2023-01-05 DIAGNOSIS — N401 Enlarged prostate with lower urinary tract symptoms: Secondary | ICD-10-CM

## 2023-01-05 DIAGNOSIS — N529 Male erectile dysfunction, unspecified: Secondary | ICD-10-CM

## 2023-01-05 DIAGNOSIS — N138 Other obstructive and reflux uropathy: Secondary | ICD-10-CM

## 2023-01-05 DIAGNOSIS — C61 Malignant neoplasm of prostate: Secondary | ICD-10-CM

## 2023-01-05 MED ORDER — POTASSIUM CHLORIDE CRYS ER 20 MEQ PO TBCR: 20.0000 meq | EXTENDED_RELEASE_TABLET | Freq: Every day | ORAL | 0 refills | Status: DC

## 2023-01-05 MED ORDER — ENTRESTO 24-26 MG PO TABS: 1.0000 | ORAL_TABLET | Freq: Two times a day (BID) | ORAL | 0 refills | Status: DC

## 2023-01-05 NOTE — Telephone Encounter (Signed)
Requested Prescriptions   Signed Prescriptions Disp Refills   potassium chloride SA (KLOR-CON M) 20 MEQ tablet 90 tablet 0    Sig: Take 1 tablet (20 mEq total) by mouth daily.    Authorizing Provider: Antonieta Iba    Ordering User: Guerry Minors   sacubitril-valsartan (ENTRESTO) 24-26 MG 180 tablet 0    Sig: Take 1 tablet by mouth 2 (two) times daily.    Authorizing Provider: Antonieta Iba    Ordering User: Guerry Minors

## 2023-01-05 NOTE — Telephone Encounter (Signed)
*  STAT* If patient is at the pharmacy, call can be transferred to refill team.   1. Which medications need to be refilled? (please list name of each medication and dose if known)   sacubitril-valsartan (ENTRESTO) 24-26 MG  potassium chloride SA (KLOR-CON M) 20 MEQ tablet    2. Would you like to learn more about the convenience, safety, & potential cost savings by using the [facility]? N/A   3. Are you open to using the [facility] (Type [facility]. N/A   4. Which pharmacy/location (including street and city if local pharmacy) is medication to be sent to? [facility] - [place], [place] - [address]    5. Do they need a 30 day or 90 day supply? 90 day supply

## 2023-01-08 NOTE — Progress Notes (Signed)
01/12/2023 9:51 AM   Christopher Escobar January 19, 1956 [id number]  Referring provider: Jaclyn Shaggy, MD [address]   [place],  [place] [address]  Urological history: 1.  Erectile dysfunction -Contributing factors of age, hypertension, heart failure, CAD, PAD, sleep apnea, hyperlipidemia, diabetes, alcohol consumption and history of smoking. -Failed PDE5 inhibitors  2.  Prostate cancer -PSA (0.02)  -?  Treated with Dr. Evelene Croon with likely brachytherapy, but Gleason score unknown  3. BPH with LU TS -Tamsulosin 0.4 mg daily  Chief Complaint  Patient presents with   Erectile Dysfunction   HPI: Christopher Escobar is a 67 y.o. male who presents today for erectile dysfunction.   Previous records reviewed.   He is no longer having spontaneous erections.  He denied any pain with erections.  He denied any curvatures with erections.  He injected some of his brother's medication prior to this appointment, but it didn't work.  He did not know the name of the medication or the dose of the medication as it was a preloaded syringe.  He did inject the medication on the ventral side of the penis at the 6 o'clock position.  He did not experience any gross hematuria or difficulty with urination after he injected.  PMH: Past Medical History:  Diagnosis Date   CAD (coronary artery disease)    Diabetes mellitus with complication (HCC) 2010   ED (erectile dysfunction)    HFrEF (heart failure with reduced ejection fraction) (HCC)    Hypertension 2008   PAD (peripheral artery disease) (HCC)    Personal history of colonic polyps    TA polyp   Sleep apnea    not using cpap    Surgical History: Past Surgical History:  Procedure Laterality Date   AMPUTATION Right 09/15/2019   Procedure: AMPUTATION RIGHT FOOT 2ND DIGIT;  Surgeon: Candelaria Stagers, DPM;  [facility];  Service: Podiatry;  Laterality: Right;   AMPUTATION Left 02/08/2021   Procedure: AMPUTATION RAY-Partial 5th Ray;  Surgeon:  Felecia Shelling, DPM;  Location: [facility];  Service: Podiatry;  Laterality: Left;   BACK SURGERY  1989   COLONOSCOPY  2013   COLONOSCOPY WITH PROPOFOL N/A 10/28/2016   Procedure: COLONOSCOPY WITH PROPOFOL;  Surgeon: Earline Mayotte, MD;  Location: [facility];  Service: Endoscopy;  Laterality: N/A;   RIGHT/LEFT HEART CATH AND CORONARY ANGIOGRAPHY Bilateral 05/19/2022   Procedure: RIGHT/LEFT HEART CATH AND CORONARY ANGIOGRAPHY;  Surgeon: Iran Ouch, MD;  Location: [facility];  Service: Cardiovascular;  Laterality: Bilateral;   SHOULDER ARTHROSCOPY WITH SUBACROMIAL DECOMPRESSION Left 04/02/2017   Procedure: SHOULDER ARTHROSCOPY WITH DECOMPRESSION, DEBRIDEMENT, SLAP REPAIR,;  Surgeon: Christena Flake, MD;  Location: [facility];  Service: Orthopedics;  Laterality: Left;   SHOULDER SURGERY Bilateral 2011,2012    Home Medications:  Allergies as of 01/12/2023   No Known Allergies      Medication List        Accurate as of January 12, 2023  9:51 AM. If you have any questions, ask your nurse or doctor.          acetaminophen 325 MG tablet Commonly known as: TYLENOL Take 2 tablets (650 mg total) by mouth every 6 (six) hours as needed for mild pain (or Fever >/= 101).   aspirin EC 81 MG tablet Take 1 tablet (81 mg total) by mouth daily. Swallow whole.   atorvastatin 40 MG tablet Commonly known as: LIPITOR Take 40 mg by mouth daily at  6 PM.   Sherryll Burger 24-26 MG Generic drug: sacubitril-valsartan Take 1 tablet by mouth 2 (two) times daily.   insulin detemir 100 UNIT/ML injection Commonly known as: LEVEMIR Inject 60 Units into the skin at bedtime.   Jardiance 25 MG Tabs tablet Generic drug: empagliflozin Take 25 mg by mouth daily.   METAMUCIL PO Take by mouth daily.   metoprolol succinate 25 MG 24 hr tablet Commonly known as: TOPROL-XL Take 25 mg every morning by mouth.   multivitamin-lutein Caps capsule Take 1 capsule by mouth 2 (two) times daily.    NovoLOG FlexPen 100 UNIT/ML FlexPen Generic drug: insulin aspart Inject 15 Units into the skin 2 (two) times daily before a meal.   oxyCODONE 15 MG immediate release tablet Commonly known as: ROXICODONE Take 15 mg by mouth every 6 (six) hours as needed.   Ozempic (2 MG/DOSE) 8 MG/3ML Sopn Generic drug: Semaglutide (2 MG/DOSE) Inject into the skin once a week.   potassium chloride SA 20 MEQ tablet Commonly known as: KLOR-CON M Take 1 tablet (20 mEq total) by mouth daily.   pregabalin 75 MG capsule Commonly known as: LYRICA Take 75 mg by mouth 2 (two) times daily.   senna 8.6 MG Tabs tablet Commonly known as: SENOKOT Take 2 tablets (17.2 mg total) by mouth at bedtime.   tadalafil 20 MG tablet Commonly known as: CIALIS Take 1 tablet (20 mg total) by mouth daily as needed (take 45 minutes prior to sexual activity).   tamsulosin 0.4 MG Caps capsule Commonly known as: FLOMAX Take 1 capsule (0.4 mg total) by mouth daily.   UNABLE TO FIND Med Name: Trimix (30/1/50)-(Pap/Phent/PGE)  Test Dose  1ml vial   Qty #3 Refills 0  [facility] [phone] Fax [phone] What changed: additional instructions Changed by: Michiel Cowboy        Allergies: No Known Allergies  Family History: Family History  Problem Relation Age of Onset   Heart attack Father 39   Heart attack Brother 89   Heart attack Brother 46    Social History:  reports that he quit smoking about 15 years ago. His smoking use included cigarettes. He started smoking about 45 years ago. He has a 30 pack-year smoking history. He has been exposed to tobacco smoke. He has never used smokeless tobacco. He reports current alcohol use. He reports that he does not use drugs.  ROS: Pertinent ROS in HPI  Physical Exam: BP 127/74 (BP Location: Left Arm, Patient Position: Sitting, Cuff Size: Large)   Pulse 91   Ht 5\' 11"  (1.803 m)   Wt 251 lb (113.9 kg)   BMI 35.01 kg/m   Constitutional:  Well  nourished. Alert and oriented, No acute distress. HEENT: [place] AT, moist mucus membranes.  Trachea midline Cardiovascular: No clubbing, cyanosis, or edema. Respiratory: Normal respiratory effort, no increased work of breathing. GU: No CVA tenderness.  No bladder fullness or masses.  Patient with uncircumcised phallus. Foreskin easily retracted  Urethral meatus is patent.  No penile discharge. No penile lesions or rashes. Scrotum without lesions, cysts, rashes and/or edema.  Neurologic: Grossly intact, no focal deficits, moving all 4 extremities. Psychiatric: Normal mood and affect.   Laboratory Data: Lab Results  Component Value Date   WBC 12.8 (H) 05/13/2022   HGB 17.3 (H) 05/19/2022   HCT 51.0 05/19/2022   MCV 88.3 05/13/2022   PLT 222 05/13/2022    Lab Results  Component Value Date   CREATININE 1.21 08/12/2022  Component Value Date/Time   CHOL 104 05/13/2022 1502   HDL 34 (L) 05/13/2022 1502   CHOLHDL 3.1 05/13/2022 1502   VLDL 16 05/13/2022 1502   LDLCALC 54 05/13/2022 1502    Lab Results  Component Value Date   AST 24 05/13/2022   Lab Results  Component Value Date   ALT 23 05/13/2022  I have reviewed the labs.   Pertinent Imaging: N/A  Procedure  Patient's left corpus cavernosum is identified.  An area near the base of the penis is cleansed with rubbing alcohol.  Careful to avoid the dorsal vein, 2 mcg of Trimix (papaverine 30 mg, phentolamine 1 mg and prostaglandin E1 10 mcg, Lot # [id number]$$[id number]_VFIEPPIRJJOACZYSAYTKZSWFUXNATFTD$  exp # 02/05/2023 is injected at a 90 degree angle into the left  corpus cavernosum near the base of the penis.  Patient experienced a penile fullness in 15 minutes.    Patient's right corpus cavernosum is identified.  An area near the base of the penis is cleansed with rubbing alcohol.  Careful to avoid the dorsal vein, 2 mcg of Trimix (papaverine 30 mg, phentolamine 1 mg and prostaglandin E1 10 mcg, Lot # [id number]GHWEXHB$$ZJIRCVELFYBOFBPZ_WCHENIDPOEUMPNTIRWERXVQMGQQPYPPJ$  exp # 02/05/2023 is injected at a 90 degree angle into  the right corpus cavernosum near the base of the penis.  Patient experienced a penile fullness in 15 minutes.    Patient's left corpus cavernosum is identified.  An area near the base of the penis is cleansed with rubbing alcohol.  Careful to avoid the dorsal vein, 2 mcg of Trimix (papaverine 30 mg, phentolamine 1 mg and prostaglandin E1 10 mcg, Lot # [id number]$$QIWLNLGXQJJHERDE_YCXKGYJEHUDJSHFWYOVZCHYIFOYDXAJO$  exp # 02/05/2023 is injected at a 90 degree angle into the left  corpus cavernosum near the base of the penis.  Patient experienced a penile fullness in 15 minutes.    Patient's left corpus cavernosum is identified.  An area near the base of the penis is cleansed with rubbing alcohol.  Careful to avoid the dorsal vein, 1 mcg of Trimix (papaverine 30 mg, phentolamine 1 mg and prostaglandin E1 10 mcg, Lot # [id number]BSWHQPRF$$FMBWGYKZLDJTTSVX_BLTJQZESPQZRAQTMAUQJFHLKTGYBWLSL$  exp # 02/05/2023 is injected at a 90 degree angle into the left  corpus cavernosum near the base of the penis.  Patient experienced a penile fullness in 15 minutes.    Assessment & Plan:    1. Erectile dysfunction -Failed to achieve an erection with the Trimix (30/1/10) -discussed repeating the titration with Trimix (30/1/50) or referring to a high volume implanter for consideration of IPP, he would like to schedule a titration appointment with the Trimix (30/1/50)  - reschedule titration appointment   2. BPH with LU TS -continue tamsulosin 0.4 mg daily   3. Prostate cancer -recent PSA at nadir -schedule follow up in 6 months   Return for Trimix titration w/ Trimix (30/1/50) .  These notes generated with voice recognition software. I apologize for typographical errors.  Cloretta Ned  [facility] [address]  [address] [place], [place] [address] [phone]  I spent 15 minutes on the day of the encounter to include pre-visit record review, face-to-face time with the patient, and post-visit ordering of tests.

## 2023-01-12 ENCOUNTER — Encounter: Payer: Self-pay | Admitting: Urology

## 2023-01-12 ENCOUNTER — Ambulatory Visit (INDEPENDENT_AMBULATORY_CARE_PROVIDER_SITE_OTHER): Payer: Medicare HMO | Admitting: Urology

## 2023-01-12 VITALS — BP 127/74 | HR 91 | Ht 71.0 in | Wt 251.0 lb

## 2023-01-12 DIAGNOSIS — C61 Malignant neoplasm of prostate: Secondary | ICD-10-CM | POA: Diagnosis not present

## 2023-01-12 DIAGNOSIS — N529 Male erectile dysfunction, unspecified: Secondary | ICD-10-CM

## 2023-01-12 DIAGNOSIS — N401 Enlarged prostate with lower urinary tract symptoms: Secondary | ICD-10-CM | POA: Diagnosis not present

## 2023-01-12 MED ORDER — UNABLE TO FIND
0 refills | Status: AC
Start: 2023-01-12 — End: ?

## 2023-01-19 NOTE — Progress Notes (Unsigned)
Cardiology Office Note  Date:  01/20/2023   ID:  [place], Galey 14-Aug-1955, MRN [id number]  PCP:  Jaclyn Shaggy, MD   Chief Complaint  Patient presents with   Follow up Echo     "Doing well." Medications reviewed by the patient verbally.     HPI:  [name] is a 67 year old gentleman with past medical history of Diabetes with complications, ulcers of feet, prior amputations, neuropathy Hypertension Obstructive sleep apnea Hyperlipidemia Former smoker, quit 2008 Back surgery L3-L5 fusion PAD, atherosclerosis of aorta,  iliac vessels on CT  Nonischemic cardiomyopathy Who presents for follow-up of his dyspnea on exertion, cardiomyopathy  Last seen by myself in clinic December 2023 Seen by one of our providers June 2024  echocardiogram completed 1/24  EF of 45-50%, global hypokinesis, G2 DD, mild mitral regurgitation.   right and left heart catheterization 05/19/2022  mild nonobstructive coronary artery disease, moderately reduced LV systolic function, right heart catheterization showed normal right arterial pressure, mild pulmonary hypertension, mildly elevated wedge pressure and normal cardiac output.   Repeat echocardiogram September 2024 EF 35 to 40%  In follow-up today reports he feels well Denies significant shortness of breath, no leg swelling, no PND orthopnea Blood pressure at home 120 systolic  Not on lasix, takes PRN  Exercise Limited secondary to toe amputations bilaterally Recent fall off his mower, bruised his arm History of chronic low back pain  EKG personally reviewed by myself on todays visit EKG Interpretation Date/Time:  Tuesday January 20 2023 11:54:54 EDT Ventricular Rate:  87 PR Interval:  176 QRS Duration:  122 QT Interval:  406 QTC Calculation: 488 R Axis:   -71  Text Interpretation: Normal sinus rhythm Left anterior fascicular block Poor R wave progression When compared with ECG of 15-Sep-2019 05:34, Premature ventricular  complexes are no longer Present QRS duration has increased Confirmed by Julien Nordmann [phone]) on 01/20/2023 11:57:57 AM   Cardiac imaging reviewed Normal ABIs April 2023  Abdominal CT scan August 2019 moderate mid to distal aortic atherosclerosis extending into the iliac vessels   PMH:   has a past medical history of CAD (coronary artery disease), Diabetes mellitus with complication (HCC) (2010), ED (erectile dysfunction), HFrEF (heart failure with reduced ejection fraction) (HCC), Hypertension (2008), PAD (peripheral artery disease) (HCC), Personal history of colonic polyps, and Sleep apnea.  PSH:    Past Surgical History:  Procedure Laterality Date   AMPUTATION Right 09/15/2019   Procedure: AMPUTATION RIGHT FOOT 2ND DIGIT;  Surgeon: Candelaria Stagers, DPM;  [facility];  Service: Podiatry;  Laterality: Right;   AMPUTATION Left 02/08/2021   Procedure: AMPUTATION RAY-Partial 5th Ray;  Surgeon: Felecia Shelling, DPM;  Location: [facility];  Service: Podiatry;  Laterality: Left;   BACK SURGERY  1989   COLONOSCOPY  2013   COLONOSCOPY WITH PROPOFOL N/A 10/28/2016   Procedure: COLONOSCOPY WITH PROPOFOL;  Surgeon: Earline Mayotte, MD;  Location: [facility];  Service: Endoscopy;  Laterality: N/A;   RIGHT/LEFT HEART CATH AND CORONARY ANGIOGRAPHY Bilateral 05/19/2022   Procedure: RIGHT/LEFT HEART CATH AND CORONARY ANGIOGRAPHY;  Surgeon: Iran Ouch, MD;  Location: [facility];  Service: Cardiovascular;  Laterality: Bilateral;   SHOULDER ARTHROSCOPY WITH SUBACROMIAL DECOMPRESSION Left 04/02/2017   Procedure: SHOULDER ARTHROSCOPY WITH DECOMPRESSION, DEBRIDEMENT, SLAP REPAIR,;  Surgeon: Christena Flake, MD;  Location: [facility];  Service: Orthopedics;  Laterality: Left;   SHOULDER SURGERY Bilateral 2011,2012    Current Outpatient Medications  Medication Sig Dispense  Refill   acetaminophen (TYLENOL) 325 MG tablet Take 2 tablets (650 mg total) by mouth every 6 (six) hours as needed for  mild pain (or Fever >/= 101).     aspirin EC 81 MG tablet Take 1 tablet (81 mg total) by mouth daily. Swallow whole. 30 tablet 1   atorvastatin (LIPITOR) 40 MG tablet Take 40 mg by mouth daily at 6 PM.      insulin detemir (LEVEMIR) 100 UNIT/ML injection Inject 60 Units into the skin at bedtime.     JARDIANCE 25 MG TABS tablet Take 25 mg by mouth daily.     metoprolol succinate (TOPROL-XL) 25 MG 24 hr tablet Take 25 mg every morning by mouth.      multivitamin-lutein (OCUVITE-LUTEIN) CAPS capsule Take 1 capsule by mouth 2 (two) times daily.     NOVOLOG FLEXPEN 100 UNIT/ML FlexPen Inject 15 Units into the skin 2 (two) times daily before a meal.     oxyCODONE (ROXICODONE) 15 MG immediate release tablet Take 15 mg by mouth every 6 (six) hours as needed.     OZEMPIC, 2 MG/DOSE, 8 MG/3ML SOPN Inject into the skin once a week.     potassium chloride SA (KLOR-CON M) 20 MEQ tablet Take 1 tablet (20 mEq total) by mouth daily. 90 tablet 0   pregabalin (LYRICA) 75 MG capsule Take 75 mg by mouth 2 (two) times daily.      Psyllium (METAMUCIL PO) Take by mouth daily.     sacubitril-valsartan (ENTRESTO) 24-26 MG Take 1 tablet by mouth 2 (two) times daily. 180 tablet 0   senna (SENOKOT) 8.6 MG TABS tablet Take 2 tablets (17.2 mg total) by mouth at bedtime. 60 tablet 0   tadalafil (CIALIS) 20 MG tablet Take 1 tablet (20 mg total) by mouth daily as needed (take 45 minutes prior to sexual activity). 30 tablet 6   tamsulosin (FLOMAX) 0.4 MG CAPS capsule Take 1 capsule (0.4 mg total) by mouth daily. 90 capsule 3   UNABLE TO FIND Med Name: Trimix (30/1/50)-(Pap/Phent/PGE)  Test Dose  1ml vial   Qty #3 Refills 0  [facility] [phone] Fax [phone] 3 mL 0   No current facility-administered medications for this visit.    Allergies:   Patient has no known allergies.   Social History:  The patient  reports that he quit smoking about 15 years ago. His smoking use included cigarettes. He started  smoking about 45 years ago. He has a 30 pack-year smoking history. He has been exposed to tobacco smoke. He has never used smokeless tobacco. He reports current alcohol use. He reports that he does not use drugs.   Family History:   family history includes Heart attack (age of onset: 43) in his father; Heart attack (age of onset: 39) in his brother; Heart attack (age of onset: 61) in his brother.    Review of Systems: Review of Systems  Constitutional: Negative.   HENT: Negative.    Respiratory: Negative.    Cardiovascular: Negative.   Gastrointestinal: Negative.   Musculoskeletal:  Positive for back pain.  Neurological: Negative.   Psychiatric/Behavioral: Negative.    All other systems reviewed and are negative.   PHYSICAL EXAM: VS:  BP 100/60 (BP Location: Left Arm, Patient Position: Sitting, Cuff Size: Normal)   Pulse 87   Ht 5\' 11"  (1.803 m)   Wt 247 lb 4 oz (112.2 kg)   SpO2 96%   BMI 34.48 kg/m  , BMI Body mass index  is 34.48 kg/m. Constitutional:  oriented to person, place, and time. No distress.  HENT:  Head: Grossly normal Eyes:  no discharge. No scleral icterus.  Neck: No JVD, no carotid bruits  Cardiovascular: Regular rate and rhythm, no murmurs appreciated Pulmonary/Chest: Clear to auscultation bilaterally, no wheezes or rails Abdominal: Soft.  no distension.  no tenderness.  Musculoskeletal: Normal range of motion Neurological:  normal muscle tone. Coordination normal. No atrophy Skin: Skin warm and dry Psychiatric: normal affect, pleasant  Recent Labs: 05/13/2022: ALT 23; B Natriuretic Peptide 855.5; Platelets 222 05/19/2022: Hemoglobin 17.3 08/12/2022: BUN 26; Creatinine, Ser 1.21; Potassium 3.9; Sodium 138    Lipid Panel Lab Results  Component Value Date   CHOL 104 05/13/2022   HDL 34 (L) 05/13/2022   LDLCALC 54 05/13/2022   TRIG 78 05/13/2022      Wt Readings from Last 3 Encounters:  01/20/23 247 lb 4 oz (112.2 kg)  01/12/23 251 lb (113.9 kg)   01/05/23 251 lb (113.9 kg)     ASSESSMENT AND PLAN:  Problem List Items Addressed This Visit       Cardiology Problems   HFrEF (heart failure with reduced ejection fraction) (HCC)   Relevant Orders   EKG 12-Lead (Completed)   Essential hypertension   Relevant Orders   EKG 12-Lead (Completed)   Other Visit Diagnoses     Coronary artery disease involving native coronary artery of native heart with other form of angina pectoris (HCC)    -  Primary   Relevant Orders   EKG 12-Lead (Completed)   Hyperlipidemia LDL goal <70       Type 2 diabetes mellitus with complication, with long-term current use of insulin (HCC)       PAD (peripheral artery disease) (HCC)       PVC (premature ventricular contraction)       Relevant Orders   EKG 12-Lead (Completed)   Shortness of breath       Relevant Orders   EKG 12-Lead (Completed)   Nonischemic cardiomyopathy (HCC)       Relevant Orders   EKG 12-Lead (Completed)       Angina/shortness of breath/coronary artery disease Coronary calcification seen on CT scan 2019 Nonischemic cardiomyopathy, recent cardiac catheterization with nonobstructive disease  history of smoking, diabetes, hyperlipidemia  Nonischemic cardiomyopathy Ejection fraction estimated 35 to 40% Recommended stay on current dose of Entresto 24/26 mg twice daily, metoprolol succinate 25 daily, Jardiance 25 daily We will add spironolactone 12.5 mg daily BMP in 4 weeks  PAD Moderate diffuse aortic and iliac disease noted on CT scan 2019 Prior amputation of toes bilaterally Cholesterol at goal  Hyperlipidemia On Lipitor 40 daily Lab work requested from primary care, goal LDL less than 70  Diabetes type 2 with complications Reports A1c 6.3,  Nonhealing ulcer on toes requiring amputation bilaterally per the patient Managed by primary care  Essential hypertension Medication changes as above  PVCs  continue metoprolol succinate 25 daily No PVCs on EKG  today     Signed, Dossie Arbour, M.D., Ph.D. [facility] [place], [place] [phone]

## 2023-01-20 ENCOUNTER — Encounter: Payer: Self-pay | Admitting: Cardiovascular Disease

## 2023-01-20 ENCOUNTER — Ambulatory Visit: Payer: Medicare HMO | Attending: Cardiovascular Disease | Admitting: Cardiovascular Disease

## 2023-01-20 VITALS — BP 120/60 | HR 87 | Ht 71.0 in | Wt 247.2 lb

## 2023-01-20 DIAGNOSIS — I25118 Atherosclerotic heart disease of native coronary artery with other forms of angina pectoris: Secondary | ICD-10-CM | POA: Diagnosis not present

## 2023-01-20 DIAGNOSIS — R0602 Shortness of breath: Secondary | ICD-10-CM

## 2023-01-20 DIAGNOSIS — Z79899 Other long term (current) drug therapy: Secondary | ICD-10-CM

## 2023-01-20 DIAGNOSIS — I10 Essential (primary) hypertension: Secondary | ICD-10-CM

## 2023-01-20 DIAGNOSIS — E118 Type 2 diabetes mellitus with unspecified complications: Secondary | ICD-10-CM

## 2023-01-20 DIAGNOSIS — E785 Hyperlipidemia, unspecified: Secondary | ICD-10-CM

## 2023-01-20 DIAGNOSIS — I428 Other cardiomyopathies: Secondary | ICD-10-CM

## 2023-01-20 DIAGNOSIS — Z794 Long term (current) use of insulin: Secondary | ICD-10-CM

## 2023-01-20 DIAGNOSIS — I739 Peripheral vascular disease, unspecified: Secondary | ICD-10-CM

## 2023-01-20 DIAGNOSIS — I5020 Unspecified systolic (congestive) heart failure: Secondary | ICD-10-CM

## 2023-01-20 DIAGNOSIS — I493 Ventricular premature depolarization: Secondary | ICD-10-CM

## 2023-01-20 MED ORDER — SPIRONOLACTONE 25 MG PO TABS: 12.5000 mg | ORAL_TABLET | Freq: Every day | ORAL | 3 refills | Status: DC

## 2023-01-20 NOTE — Patient Instructions (Addendum)
Medication Instructions:  Please start spironolactone 12.5 mg daily  Cut the 25 mg pill in 1/2 daily  If you need a refill on your cardiac medications before your next appointment, please call your pharmacy.   Lab work: BMP in 1 month in [place]  Your provider would like for you to return in 1 month to have the following labs drawn: BMP.   Please go to [facility] [address] ([facility] [address], [place] [address] You do not need an appointment.  They are open from 7:30 am-4 pm.  Lunch from 1:00 pm- 2:00 pm You will not need to be fasting.   You may also go to any of these [facility] locations:   [facility] - [address] ([facility]) - [address] [phone] [address]   [place] - [address]    [place]  - [address]    [place] - [address] - [address] [employer]  - [address] [place] - [address] Chief Technology Officer)     Testing/Procedures: No new testing needed  Follow-Up: At [facility], you and your health needs are our priority.  As part of our continuing mission to provide you with exceptional heart care, we have created designated Provider Care Teams.  These Care Teams include your primary Cardiologist (physician) and Advanced Practice Providers (APPs -  Physician Assistants and Nurse Practitioners) who all work together to provide you with the care you need, when you need it.  You will need a follow up appointment in 6 months  Providers on your designated Care Team:   Nicolasa Ducking, NP Eula Listen, PA-C Cadence Fransico Michael, [place]  COVID-19 Vaccine Information can be found at: [email or web address] For questions related to vaccine distribution or appointments, please email [email or web address] or call [phone].

## 2023-01-21 NOTE — Progress Notes (Signed)
01/26/2023 9:19 AM   Christopher Escobar 12-14-1955 [id number]  Referring provider: Jaclyn Shaggy, MD [address]   [place],  [place] [address]  Urological history: 1.  Erectile dysfunction -Contributing factors of age, hypertension, heart failure, CAD, PAD, sleep apnea, hyperlipidemia, diabetes, alcohol consumption and history of smoking. -Failed PDE5 inhibitors  2.  Prostate cancer -PSA (11/2022) - 0.02 -?  Treated with Dr. Evelene Croon with likely brachytherapy, but Gleason score unknown  3. BPH with LU TS -Tamsulosin 0.4 mg daily  Chief Complaint  Patient presents with   Erectile Dysfunction    Trimix teaching    HPI: Christopher Escobar is a 67 y.o. male who presents today for erectile dysfunction.   Previous records reviewed.   At his visit on 01/12/2023, He is no longer having spontaneous erections.  He denied any pain with erections.  He denied any curvatures with erections.  He injected some of his brother's medication prior to this appointment, but it didn't work.  He did not know the name of the medication or the dose of the medication as it was a preloaded syringe.  He did inject the medication on the ventral side of the penis at the 6 o'clock position.  He did not experience any gross hematuria or difficulty with urination after he injected.   He did not achieve an erection after 0.8 of Trimix (30/1/10).   After discussion of what his next steps would be, he decided to repeat a titration with a high potency Trimix.   PMH: Past Medical History:  Diagnosis Date   CAD (coronary artery disease)    Diabetes mellitus with complication (HCC) 2010   ED (erectile dysfunction)    HFrEF (heart failure with reduced ejection fraction) (HCC)    Hypertension 2008   PAD (peripheral artery disease) (HCC)    Personal history of colonic polyps    TA polyp   Sleep apnea    not using cpap    Surgical History: Past Surgical History:  Procedure Laterality Date   AMPUTATION  Right 09/15/2019   Procedure: AMPUTATION RIGHT FOOT 2ND DIGIT;  Surgeon: Candelaria Stagers, DPM;  [facility];  Service: Podiatry;  Laterality: Right;   AMPUTATION Left 02/08/2021   Procedure: AMPUTATION RAY-Partial 5th Ray;  Surgeon: Felecia Shelling, DPM;  Location: [facility]S;  Service: Podiatry;  Laterality: Left;   BACK SURGERY  1989   COLONOSCOPY  2013   COLONOSCOPY WITH PROPOFOL N/A 10/28/2016   Procedure: COLONOSCOPY WITH PROPOFOL;  Surgeon: Earline Mayotte, MD;  Location: [facility];  Service: Endoscopy;  Laterality: N/A;   RIGHT/LEFT HEART CATH AND CORONARY ANGIOGRAPHY Bilateral 05/19/2022   Procedure: RIGHT/LEFT HEART CATH AND CORONARY ANGIOGRAPHY;  Surgeon: Iran Ouch, MD;  Location: [facility];  Service: Cardiovascular;  Laterality: Bilateral;   SHOULDER ARTHROSCOPY WITH SUBACROMIAL DECOMPRESSION Left 04/02/2017   Procedure: SHOULDER ARTHROSCOPY WITH DECOMPRESSION, DEBRIDEMENT, SLAP REPAIR,;  Surgeon: Christena Flake, MD;  Location: [facility];  Service: Orthopedics;  Laterality: Left;   SHOULDER SURGERY Bilateral 2011,2012    Home Medications:  Allergies as of 01/26/2023   No Known Allergies      Medication List        Accurate as of January 26, 2023  9:19 AM. If you have any questions, ask your nurse or doctor.          acetaminophen 325 MG tablet Commonly known as: TYLENOL Take 2 tablets (650 mg total) by mouth  every 6 (six) hours as needed for mild pain (or Fever >/= 101).   aspirin EC 81 MG tablet Take 1 tablet (81 mg total) by mouth daily. Swallow whole.   atorvastatin 40 MG tablet Commonly known as: LIPITOR Take 40 mg by mouth daily at 6 PM.   Entresto 24-26 MG Generic drug: sacubitril-valsartan Take 1 tablet by mouth 2 (two) times daily.   furosemide 20 MG tablet Commonly known as: LASIX Take 1 tablet (20 mg total) by mouth daily as needed for edema.   insulin detemir 100 UNIT/ML injection Commonly known as: LEVEMIR Inject 60 Units  into the skin at bedtime.   Jardiance 25 MG Tabs tablet Generic drug: empagliflozin Take 25 mg by mouth daily.   METAMUCIL PO Take by mouth daily.   metoprolol succinate 25 MG 24 hr tablet Commonly known as: TOPROL-XL Take 25 mg every morning by mouth.   multivitamin-lutein Caps capsule Take 1 capsule by mouth 2 (two) times daily.   NovoLOG FlexPen 100 UNIT/ML FlexPen Generic drug: insulin aspart Inject 15 Units into the skin 2 (two) times daily before a meal.   oxyCODONE 15 MG immediate release tablet Commonly known as: ROXICODONE Take 15 mg by mouth every 6 (six) hours as needed.   Ozempic (2 MG/DOSE) 8 MG/3ML Sopn Generic drug: Semaglutide (2 MG/DOSE) Inject into the skin once a week.   potassium chloride SA 20 MEQ tablet Commonly known as: KLOR-CON M Take 1 tablet (20 mEq total) by mouth daily.   pregabalin 75 MG capsule Commonly known as: LYRICA Take 75 mg by mouth 2 (two) times daily.   senna 8.6 MG Tabs tablet Commonly known as: SENOKOT Take 2 tablets (17.2 mg total) by mouth at bedtime.   spironolactone 25 MG tablet Commonly known as: Aldactone Take 0.5 tablets (12.5 mg total) by mouth daily.   tadalafil 20 MG tablet Commonly known as: CIALIS Take 1 tablet (20 mg total) by mouth daily as needed (take 45 minutes prior to sexual activity).   tamsulosin 0.4 MG Caps capsule Commonly known as: FLOMAX Take 1 capsule (0.4 mg total) by mouth daily.   UNABLE TO FIND Med Name: Trimix (30/1/50)-(Pap/Phent/PGE)  Test Dose  1ml vial   Qty #3 Refills 0  [facility] [phone] Fax [phone]        Allergies: No Known Allergies  Family History: Family History  Problem Relation Age of Onset   Heart attack Father 82   Heart attack Brother 25   Heart attack Brother 50    Social History:  reports that he quit smoking about 15 years ago. His smoking use included cigarettes. He started smoking about 46 years ago. He has a 30 pack-year  smoking history. He has been exposed to tobacco smoke. He has never used smokeless tobacco. He reports current alcohol use. He reports that he does not use drugs.  ROS: Pertinent ROS in HPI  Physical Exam: BP 116/76   Pulse 69   Ht 5\' 11"  (1.803 m)   Wt 249 lb 6 oz (113.1 kg)   BMI 34.78 kg/m   Constitutional:  Well nourished. Alert and oriented, No acute distress. HEENT: [place] AT, moist mucus membranes.  Trachea midline Cardiovascular: No clubbing, cyanosis Respiratory: Normal respiratory effort, no increased work of breathing. GU: Patient with uncircumcised phallus. Foreskin easily retracted  Urethral meatus is patent.  No penile discharge. No penile lesions or rashes.  Neurologic: Grossly intact, no focal deficits, moving all 4 extremities. Psychiatric: Normal mood and  affect.   Laboratory Data: N/A   Pertinent Imaging: N/A  Procedure  Patient's left corpus cavernosum is identified.  An area near the base of the penis is cleansed with rubbing alcohol.  Careful to avoid the dorsal vein, 4 mcg of Trimix (papaverine 30 mg, phentolamine 1 mg and prostaglandin E1 50 mcg, Lot # [id number]  exp # 02/19/2023 is injected at a 90 degree angle into the left  corpus cavernosum near the base of the penis.  Patient experienced a penile fullness in 15 minutes.    Patient's right corpus cavernosum is identified.  An area near the base of the penis is cleansed with rubbing alcohol.  Careful to avoid the dorsal vein, 4 mcg of Trimix (papaverine 30 mg, phentolamine 1 mg and prostaglandin E1 50 mcg, Lot # [id number]   exp # 02/19/2023 is injected at a 90 degree angle into the right corpus cavernosum near the base of the penis.  Patient experienced a penile fullness in 15 minutes.    Assessment & Plan:    1. Erectile dysfunction -Failed to achieve an erection with the Trimix (30/1/10) -failed to achieve an erection with Trimix (30/1/50)  -would like a referral to [facility] for consideration for IPP  2.  BPH with LU TS -continue tamsulosin 0.4 mg daily   3. Prostate cancer -recent PSA at nadir -schedule follow up in 6 months   Return for March with Dr. Richardo Hanks .  These notes generated with voice recognition software. I apologize for typographical errors.  Cloretta Ned  [facility] [address]  [address] [place], [place] [address] [phone]  I spent 15 minutes on the day of the encounter to include pre-visit record review, face-to-face time with the patient, and post-visit ordering of tests.

## 2023-01-26 ENCOUNTER — Ambulatory Visit: Payer: Medicare HMO | Admitting: Urology

## 2023-01-26 ENCOUNTER — Encounter: Payer: Self-pay | Admitting: Urology

## 2023-01-26 VITALS — BP 116/76 | HR 69 | Ht 71.0 in | Wt 249.4 lb

## 2023-01-26 DIAGNOSIS — N401 Enlarged prostate with lower urinary tract symptoms: Secondary | ICD-10-CM | POA: Diagnosis not present

## 2023-01-26 DIAGNOSIS — C61 Malignant neoplasm of prostate: Secondary | ICD-10-CM

## 2023-01-26 DIAGNOSIS — N529 Male erectile dysfunction, unspecified: Secondary | ICD-10-CM | POA: Diagnosis not present

## 2023-01-26 DIAGNOSIS — Z8546 Personal history of malignant neoplasm of prostate: Secondary | ICD-10-CM

## 2023-01-26 DIAGNOSIS — N138 Other obstructive and reflux uropathy: Secondary | ICD-10-CM

## 2023-01-26 NOTE — Addendum Note (Signed)
Addended by: Consuella Lose on: 01/26/2023 09:26 AM   Modules accepted: Orders

## 2023-02-20 ENCOUNTER — Other Ambulatory Visit: Payer: Self-pay | Admitting: Cardiology

## 2023-02-24 ENCOUNTER — Encounter: Payer: Self-pay | Admitting: Emergency Medicine

## 2023-03-20 ENCOUNTER — Other Ambulatory Visit: Payer: Self-pay | Admitting: Cardiovascular Disease

## 2023-04-24 ENCOUNTER — Other Ambulatory Visit: Payer: Self-pay | Admitting: Cardiovascular Disease

## 2023-06-24 ENCOUNTER — Other Ambulatory Visit
Admission: RE | Admit: 2023-06-24 | Discharge: 2023-06-24 | Disposition: A | Discharge status: Home / Self Care | Attending: Urology | Admitting: Urology

## 2023-06-24 ENCOUNTER — Ambulatory Visit (INDEPENDENT_AMBULATORY_CARE_PROVIDER_SITE_OTHER): Payer: Medicare HMO | Admitting: Urology

## 2023-06-24 VITALS — BP 136/84 | HR 90 | Ht 71.0 in | Wt 263.6 lb

## 2023-06-24 DIAGNOSIS — N138 Other obstructive and reflux uropathy: Secondary | ICD-10-CM | POA: Diagnosis present

## 2023-06-24 DIAGNOSIS — R399 Unspecified symptoms and signs involving the genitourinary system: Secondary | ICD-10-CM | POA: Diagnosis not present

## 2023-06-24 DIAGNOSIS — Z8546 Personal history of malignant neoplasm of prostate: Secondary | ICD-10-CM

## 2023-06-24 DIAGNOSIS — N401 Enlarged prostate with lower urinary tract symptoms: Secondary | ICD-10-CM | POA: Diagnosis present

## 2023-06-24 DIAGNOSIS — C61 Malignant neoplasm of prostate: Secondary | ICD-10-CM | POA: Insufficient documentation

## 2023-06-24 DIAGNOSIS — N5235 Erectile dysfunction following radiation therapy: Secondary | ICD-10-CM | POA: Diagnosis not present

## 2023-06-24 LAB — PSA: Prostatic Specific Antigen: 0.02 ng/mL (ref 0.00–4.00)

## 2023-06-24 NOTE — Addendum Note (Signed)
 Addended by: Frankey Shown on: 06/24/2023 09:34 AM   Modules accepted: Orders

## 2023-06-24 NOTE — Patient Instructions (Signed)

## 2023-06-24 NOTE — Progress Notes (Signed)
   06/24/2023 8:48 AM   [name] Jul 16, 1955 [id number]  Reason for visit: Follow up prostate cancer, urinary symptoms, ED  HPI: Morbid 68 year old male who was previously followed by Dr. Sheppard Penton for the above issues and establish care with [facility] in September 2024.  His other medical issues include obesity, CAD, diabetes, CHF, sleep apnea noncompliant with CPAP, and favorable intermediate risk prostate cancer treated with brachytherapy in 2019.  PSA has been undetectable since that time.  He is on Flomax for urinary symptoms of weak stream and frequency.  He has ED refractory to PDE 5 inhibitors as well as penile injections.  I was able to review the outside records from Dr. Sheppard Penton in the media tab today confirming above history.  Most recent PSA from 12/23/2022 remains undetectable at <0.1.  Urinary symptoms currently well-controlled on Flomax, persistent nocturia 2-3 times at night and we discussed the relationship between sleep apnea and nocturia, behavioral strategies were discussed.  Could consider doubling the Flomax dose in the future as well.  In terms of ED, he was interested in considering a penile prosthesis, and I placed a referral to Dr. Lafonda Mosses in [place].  Flomax refilled Behavioral strategies discussed regarding nocturia, as well as reconsideration of CPAP for sleep apnea Referral placed to [facility] to consider penile prosthesis RTC 1 year PVR, PSA prior  Sondra Come, MD  [facility] [address] [place], [place] [address] [phone]      3+4=7 brachy 2019, 0.1 11/2021

## 2023-07-10 ENCOUNTER — Other Ambulatory Visit: Payer: Self-pay | Admitting: Cardiovascular Disease

## 2023-09-09 ENCOUNTER — Other Ambulatory Visit: Payer: Self-pay

## 2023-09-09 MED ORDER — FUROSEMIDE 20 MG PO TABS: 20.0000 mg | ORAL_TABLET | Freq: Every day | ORAL | 0 refills | Status: DC | PRN

## 2023-10-12 ENCOUNTER — Other Ambulatory Visit: Payer: Self-pay | Admitting: Cardiology

## 2023-10-16 ENCOUNTER — Other Ambulatory Visit: Payer: Self-pay

## 2023-10-16 MED ORDER — JARDIANCE 25 MG PO TABS: 25.0000 mg | ORAL_TABLET | Freq: Every day | ORAL | 3 refills | Status: AC

## 2023-10-16 NOTE — Telephone Encounter (Signed)
 Pt's pharmacy is requesting a refill on medication Jardiance  25 mg tablets. This medication has not been refilled since 2022 and this medication was prescribed in the hospital. Dr. Gollan did not prescribe this medication and jardiance  25 mg tablets are normally filled by PCP, we refill jardiance  10 mg tablets. Would Dr. Gollan like to refill this medication? Please address

## 2023-11-02 ENCOUNTER — Ambulatory Visit: Attending: Cardiovascular Disease | Admitting: Cardiovascular Disease

## 2023-11-02 VITALS — BP 98/60 | HR 90 | Ht 71.0 in | Wt 261.0 lb

## 2023-11-02 DIAGNOSIS — I493 Ventricular premature depolarization: Secondary | ICD-10-CM | POA: Diagnosis not present

## 2023-11-02 DIAGNOSIS — I25118 Atherosclerotic heart disease of native coronary artery with other forms of angina pectoris: Secondary | ICD-10-CM | POA: Diagnosis not present

## 2023-11-02 DIAGNOSIS — I10 Essential (primary) hypertension: Secondary | ICD-10-CM | POA: Diagnosis not present

## 2023-11-02 DIAGNOSIS — I5020 Unspecified systolic (congestive) heart failure: Secondary | ICD-10-CM | POA: Diagnosis not present

## 2023-11-02 MED ORDER — SPIRONOLACTONE 25 MG PO TABS: 12.5000 mg | ORAL_TABLET | Freq: Every day | ORAL | 3 refills | Status: AC

## 2023-11-02 MED ORDER — METOPROLOL SUCCINATE ER 25 MG PO TB24: 25.0000 mg | ORAL_TABLET | ORAL | 3 refills | Status: AC

## 2023-11-02 MED ORDER — FUROSEMIDE 20 MG PO TABS: 20.0000 mg | ORAL_TABLET | Freq: Every day | ORAL | 3 refills | Status: DC | PRN

## 2023-11-02 NOTE — Patient Instructions (Signed)

## 2023-11-02 NOTE — Progress Notes (Signed)
 Cardiology Office Note  Date:  11/02/2023   ID:  Christopher, Escobar Apr 28, 1955, MRN [id number]  PCP:  Christopher Honor BROCKS, MD   Chief Complaint  Patient presents with   6 month follow up     Doing well.     HPI:  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 Denies significant chest pain or shortness of breath on exertion No PND orthopnea, no leg swelling  Tolerating his medications including metoprolol  succinate and Entresto  spironolactone  Jardiance   States A1c running higher after he received steroids  EKG personally reviewed by myself on todays visit EKG Interpretation Date/Time:  Monday November 02 2023 16:22:56 EDT Ventricular Rate:  90 PR Interval:  196 QRS Duration:  124 QT Interval:  368 QTC Calculation: 450 R Axis:   -85  Text Interpretation: Normal sinus rhythm Left axis deviation Right bundle branch block Inferior infarct (cited on or before 02-Nov-2023) Anterolateral infarct , age undetermined When compared with ECG of 20-Jan-2023 11:54, Left anterior fascicular block is no longer Present Right bundle branch block is now Present Anterior infarct is now Present Anterolateral infarct is now Present Confirmed by Christopher Escobar [phone]) on 11/02/2023 4:31:38 PM   Other past medical history reviewed echocardiogram completed 1/24  EF of 45-50%, global hypokinesis, G2 DD, mild mitral regurgitation.   right and left heart catheterization 05/19/2022  mild nonobstructive  coronary artery disease, moderately reduced LV systolic function, right heart catheterization showed normal right arterial pressure, mild pulmonary hypertension, mildly elevated wedge pressure and normal cardiac output.    echocardiogram September 2024 EF 35 to 40%  Cardiac imaging reviewed Normal ABIs April 2023  Abdominal CT scan August 2019 moderate mid to distal aortic atherosclerosis extending into the iliac vessels   PMH:   has a past medical history of CAD (coronary artery disease), Diabetes mellitus with complication (HCC) (2010), ED (erectile dysfunction), HFrEF (heart failure with reduced ejection fraction) (HCC), Hypertension (2008), PAD (peripheral artery disease) (HCC), Personal history of colonic polyps, and Sleep apnea.  PSH:    Past Surgical History:  Procedure Laterality Date   AMPUTATION Right 09/15/2019   Procedure: AMPUTATION RIGHT FOOT 2ND DIGIT;  Surgeon: Christopher Escobar, DPM;  Location[facility];  Service: Podiatry;  Laterality: Right;   AMPUTATION Left 02/08/2021   Procedure: AMPUTATION RAY-Partial 5th Ray;  Surgeon: Christopher Escobar, DPM;  Location: [facility] OR[facility];  Service: Podiatry;  Laterality: Left;   BACK SURGERY  1989   COLONOSCOPY  2013   COLONOSCOPY WITH PROPOFOL  N/A 10/28/2016   Procedure: COLONOSCOPY WITH PROPOFOL ;  Surgeon: Christopher Reyes ORN, MD;  Location: [facility] END[facility];  Service: Endoscopy;  Laterality: N/A;   RIGHT/LEFT HEART CATH AND CORONARY ANGIOGRAPHY Bilateral 05/19/2022   Procedure: RIGHT/LEFT HEART CATH AND CORONARY ANGIOGRAPHY;  Surgeon: Christopher Deatrice LABOR, MD;  Location: [facility];  Service: Cardiovascular;  Laterality: Bilateral;   SHOULDER ARTHROSCOPY WITH SUBACROMIAL DECOMPRESSION Left 04/02/2017   Procedure: SHOULDER ARTHROSCOPY WITH DECOMPRESSION, DEBRIDEMENT, SLAP REPAIR,;  Surgeon: Christopher Norleen PARAS, MD;  Location: [facility] OR[facility];  Service: Orthopedics;  Laterality: Left;  SHOULDER SURGERY Bilateral 2011,2012    Current Outpatient Medications   Medication Sig Dispense Refill   acetaminophen  (TYLENOL ) 325 MG tablet Take 2 tablets (650 mg total) by mouth every 6 (six) hours as needed for mild pain (or Fever >/= 101).     aspirin  EC 81 MG tablet Take 1 tablet (81 mg total) by mouth daily. Swallow whole. 30 tablet 1   atorvastatin  (LIPITOR) 40 MG tablet Take 40 mg by mouth daily at 6 PM.      Continuous Glucose Sensor (FREESTYLE LIBRE 3 PLUS SENSOR) MISC every 14 (fourteen) days.     folic acid (FOLVITE) 1 MG tablet Take 1 mg by mouth daily.     furosemide  (LASIX ) 20 MG tablet Take 1 tablet (20 mg total) by mouth daily as needed. 90 tablet 0   JARDIANCE  25 MG TABS tablet Take 1 tablet (25 mg total) by mouth daily. 90 tablet 3   methotrexate (RHEUMATREX) 2.5 MG tablet Take 20 mg by mouth once a week.     metoprolol  succinate (TOPROL -XL) 25 MG 24 hr tablet Take 25 mg every morning by mouth.      multivitamin-lutein  (OCUVITE-LUTEIN ) CAPS capsule Take 1 capsule by mouth 2 (two) times daily.     NOVOLOG  FLEXPEN 100 UNIT/ML FlexPen Inject 15 Units into the skin 2 (two) times daily before a meal.     NOVOLOG  MIX 70/30 FLEXPEN (70-30) 100 UNIT/ML FlexPen Inject into the skin.     oxyCODONE  (ROXICODONE ) 15 MG immediate release tablet Take 15 mg by mouth every 6 (six) hours as needed.     OZEMPIC, 2 MG/DOSE, 8 MG/3ML SOPN Inject into the skin once a week.     potassium chloride  SA (KLOR-CON  M) 20 MEQ tablet TAKE 1 TABLET(20 MEQ) BY MOUTH DAILY 90 tablet 3   pregabalin  (LYRICA ) 75 MG capsule Take 75 mg by mouth 2 (two) times daily.      Psyllium (METAMUCIL PO) Take by mouth daily.     sacubitril-valsartan (ENTRESTO ) 24-26 MG TAKE 1 TABLET BY MOUTH TWICE DAILY 60 tablet 11   senna (SENOKOT) 8.6 MG TABS tablet Take 2 tablets (17.2 mg total) by mouth at bedtime. 60 tablet 0   spironolactone  (ALDACTONE ) 25 MG tablet Take 0.5 tablets (12.5 mg total) by mouth daily. 45 tablet 3   tadalafil  (CIALIS ) 20 MG tablet Take 1 tablet (20 mg total) by mouth daily  as needed (take 45 minutes prior to sexual activity). 30 tablet 6   tamsulosin  (FLOMAX ) 0.4 MG CAPS capsule Take 1 capsule (0.4 mg total) by mouth daily. 90 capsule 3   TRESIBA FLEXTOUCH 100 UNIT/ML FlexTouch Pen Inject into the skin.     UNABLE TO FIND Med Name: Trimix (30/1/50)-(Pap/Phent/PGE)  Test Dose  1ml vial   Qty #3 Refills 0  [facility] [phone] Fax [phone] 3 mL 0   No current facility-administered medications for this visit.    Allergies:   Patient has no known allergies.   Social History:  The patient  reports that he quit smoking about 16 years ago. His smoking use included cigarettes. He started smoking about 46 years ago. He has a 30 pack-year smoking history. He has been exposed to tobacco smoke. He has never used smokeless tobacco. He reports current alcohol  use. He reports that he does not use drugs.   Family History:   family history includes Heart attack (age of onset: 52) in his father; Heart attack (age of onset: 26) in his brother; Heart  attack (age of onset: 22) in his brother.    Review of Systems: Review of Systems  Constitutional: Negative.   HENT: Negative.    Respiratory: Negative.    Cardiovascular: Negative.   Gastrointestinal: Negative.   Musculoskeletal:  Positive for back pain.  Neurological: Negative.   Psychiatric/Behavioral: Negative.    All other systems reviewed and are negative.  PHYSICAL EXAM: VS:  BP 98/60 (BP Location: Right Arm, Patient Position: Sitting, Cuff Size: Large)   Pulse 90   Ht 5' 11 (1.803 m)   Wt 261 lb (118.4 kg)   SpO2 98%   BMI 36.40 kg/m  , BMI Body mass index is 36.4 kg/m. Constitutional:  oriented to person, place, and time. No distress.  HENT:  Head: Grossly normal Eyes:  no discharge. No scleral icterus.  Neck: No JVD, no carotid bruits  Cardiovascular: Regular rate and rhythm, no murmurs appreciated Pulmonary/Chest: Clear to auscultation bilaterally, no wheezes or rales Abdominal:  Soft.  no distension.  no tenderness.  Musculoskeletal: Normal range of motion Neurological:  normal muscle tone. Coordination normal. No atrophy Skin: Skin warm and dry Psychiatric: normal affect, pleasant   Recent Labs: No results found for requested labs within last 365 days.    Lipid Panel Lab Results  Component Value Date   CHOL 104 05/13/2022   HDL 34 (L) 05/13/2022   LDLCALC 54 05/13/2022   TRIG 78 05/13/2022      Wt Readings from Last 3 Encounters:  11/02/23 261 lb (118.4 kg)  06/24/23 263 lb 9.6 oz (119.6 kg)  01/26/23 249 lb 6 oz (113.1 kg)     ASSESSMENT AND PLAN:  Problem List Items Addressed This Visit       Cardiology Problems   HFrEF (heart failure with reduced ejection fraction) (HCC)   Relevant Orders   EKG 12-Lead (Completed)   Essential hypertension   Relevant Orders   EKG 12-Lead (Completed)   Other Visit Diagnoses       Coronary artery disease involving native coronary artery of native heart with other form of angina pectoris (HCC)    -  Primary   Relevant Orders   EKG 12-Lead (Completed)     PVC (premature ventricular contraction)       Relevant Orders   EKG 12-Lead (Completed)       Angina/shortness of breath/coronary artery disease Coronary calcification seen on CT scan 2019 Nonischemic cardiomyopathy,  Prior cardiac catheterization with nonobstructive disease  history of smoking, diabetes, hyperlipidemia - No further testing needed at this time - Denies anginal symptoms  Nonischemic cardiomyopathy Ejection fraction estimated 35 to 40% Suggest he continue on Entresto  24/26 mg twice daily, metoprolol  succinate 25 daily, Jardiance  25 daily, spironolactone  12.5 mg daily, Lasix  as needed - Blood pressure low, will hold off on additional titration of GDMT  PAD Moderate diffuse aortic and iliac disease noted on CT scan 2019 Prior amputation of toes bilaterally Stressed importance of aggressive diabetes control, cholesterol at goal,  quit smoking 2008  Hyperlipidemia On Lipitor 40 daily Cholesterol at goal  Diabetes type 2 with complications history of nonhealing ulcer on toes requiring amputation bilaterally per the patient Aggressive management recommended  Essential hypertension Blood pressure low limiting further titration of medications  PVCs  continue metoprolol  succinate 25 daily No PVCs on EKG today    Signed, Velinda Lunger, M.D., Ph.D. [facility] [place], [place] [phone]

## 2023-11-26 ENCOUNTER — Encounter: Payer: Self-pay | Admitting: Urology

## 2023-11-27 ENCOUNTER — Encounter: Payer: Self-pay | Admitting: Emergency Medicine

## 2023-12-07 ENCOUNTER — Telehealth: Payer: Self-pay

## 2023-12-07 NOTE — Telephone Encounter (Signed)
 LVM to call office and schedule LDCT and SDMV.   Copied from CRM [id number]. Topic: Appointments - Scheduling Inquiry for Clinic >> Dec 04, 2023  3:52 PM Rozanna MATSU wrote: Reason for CRM:  pt calling to get his Lung screening schedule clinic closed for the day, advised pt will be Monday when someone contact him to schedule

## 2023-12-15 ENCOUNTER — Ambulatory Visit: Admitting: Cardiovascular Disease

## 2023-12-29 ENCOUNTER — Encounter: Payer: Self-pay | Admitting: Urology

## 2024-02-27 LAB — COLOGUARD: COLOGUARD: NEGATIVE

## 2024-03-17 ENCOUNTER — Encounter: Payer: Self-pay | Admitting: Urology

## 2024-04-05 NOTE — Progress Notes (Signed)
 Rheumatology Follow Up Note  Chief Complaint  Patient presents with   Rheumatoid arthritis involving multiple sites with positive      Subjective:HPI   Christopher Escobar is a 69 y.o. male is here today for follow up of rheumatoid arthritis stage 2 moderate. The patient's allergies, current medications, past family history, past medical history, past social history, past surgical history and problem list were reviewed and updated as appropriate.   He is taking the methotrexate. He is tolerating this well. He does get right wrist pain at times denies much swelling. He has no other joint pains or swelling. He has no fever, infection, nasal/oral ulcer, dry cough or dyspnea.   Review of Systems:   Review of Systems  Constitutional:  Negative for fatigue.  HENT:  Negative for mouth sores and trouble swallowing.        Neg: Dry Mouth  Eyes:  Negative for redness.       Neg: Dry Eyes  Respiratory:  Negative for cough and shortness of breath.   Cardiovascular:  Negative for chest pain and leg swelling.  Gastrointestinal:  Negative for constipation, diarrhea and nausea.  Endocrine: Negative for cold intolerance and heat intolerance.  Genitourinary:  Negative for hematuria.  Musculoskeletal:        Per HPI  Skin:  Negative for color change and rash.  Neurological:  Negative for dizziness, weakness, numbness and headaches.  Hematological:  Does not bruise/bleed easily.  Psychiatric/Behavioral:  Negative for dysphoric mood and sleep disturbance. The patient is not nervous/anxious.   All other systems reviewed and are negative.     Objective:  Vitals:   04/05/24 1111  BP: 139/82  Pulse: 89  Temp: 36.3 C (97.4 F)  TempSrc: Temporal  Weight: (!) 117.9 kg (260 lb)  Height: 180.3 cm (5' 11)  PainSc:   2     Length of Stiffness: 60 minutes   GEN - Pleasant, No Apparent Distress  HEENT - normocephalic and atraumatic. Conjunctiva Clear.  No Nasal/Oral Ulcer. Adequate Saliva Neck -  supple with no adenopathy or thyromegaly.   C spine with full range of motion. Heart - regular rate and rhythm, No murmurs/gallops/rub, Nml S1S2 Lungs - clear to auscultation in all fields. Extremities - there is no cyanosis or edema. Neurological - alert and oriented.  Spine - no paraspinal tenderness; no lumbar spine tenderness Skin - no rashes observed MSK - The following joints were examined bilaterally: Hands, Wrists, Elbows, Shoulders, Metatarsals, Ankels, Knees and Hips; they were normal apart from what is noted.    100% Fist Formation DIP and PIP Enlargement Right Wrist Tenderness; wrist with proliferative changes No Dactylitis No Tender Point ______________________________________________________________________ Labs/Imaging Reviewed in EMR Cr 1.0, AST 22, ALT 22 CBC Hgb 15.6, Hct 47.1, Plt 232 ESR 14 Uric Acid 6.5 CRP 3 Pos: RF (65.9) Neg: ANA Direct, ASO, AntiCCP, Anti-CarP, Anti-CEP-1, Anti-Sa, Anti-MCV Neg: Hep B and C   Right Wrist (06/2023): positive CMC degenerative joint changes with minimal distal radial carpal joint narrowing. There is minimal sclerotic changes and osteophyte formation.    CDAI is calculated as follows:   CDAI = SJC + TJC +PGA + EGA = 06 Where:   SJC = Swollen Joint Count TJC = Tender Joint Count PGA = Patient Global Assessment of Disease Activity EGA = Evaluator Global Assessment of Disease Activity   Interpretation <=2.8: Remission >2.8 and <=10: Low Disease Activity >10 and <=22: Moderate Disease Activity >22: High Disease Activity  Assessment and Plan     Rheumatoid arthritis, stage 2 moderate: Stable -- Diagnosed June 2025, Pos RF, Right MCP, Wrist and PIP joint with inflammation and tenderness -- Prednisone  dose increase his blood sugars -- Right Wrist Cortisone Injection helped some  -- Continue Methotrexate 20 mg every seven days and Folic Acid 1 mg/day -- Check Labs   2. Osteoarthritis of the hands -- Continue with  Tylenol  for pain control   3. Long term use immunosuppressive therapy -- Methotrexate is discussed at length including risks, benefits, adverse effects, direction on use and need for monitoring on a routine basis with office visits and labs.  Literature regarding methotrexate was provided to the patient.  Patient directed to call office if any adverse effect develops or if more questions arise.  In addition, issues addressed included mouth sores, skin rashes, gastrointestinal side effects, fever, pulmonary side effects, hair loss, and abnormal lab findings.  Discussed the use of folic acid with methotrexate. -- Check Labs   Diagnoses and all orders for this visit:  Rheumatoid arthritis involving multiple sites with positive rheumatoid factor (CMS/HHS-HCC) -     methotrexate (RHEUMATREX) 2.5 MG tablet; Take 8 tablets (20 mg total) by mouth every 7 (seven) days All on the same day -     Alanine Aminotransferase (ALT) -     Albumin -     Aspartate Aminotransferase (AST) -     CBC w/auto Differential (5 Part) -     Creatinine -     Sedimentation Rate-Westergren - [facility]  Generalized osteoarthritis of hand  Long-term use of immunosuppressant medication -     Alanine Aminotransferase (ALT) -     Albumin -     Aspartate Aminotransferase (AST) -     CBC w/auto Differential (5 Part) -     Creatinine  Other orders -     Follow up in Rheumatology; Future -     Cancel: Follow up in Rheumatology; Future      Follow up with Mayur Loree Blanch, MD in about 3 months (around 07/04/2024).   All new prescription medications, changes in current prescription dosages, and sample medications were discussed with the patient, including patient education, medication name, use, dosage, potential side effects, drug interactions, consequences of not using/taking, and special instructions.  Patient expressed understanding.  No barriers to adherence.   I appreciate the opportunity to participate in the care  of Christopher Escobar. Please do not hesitate to contact me with any questions or concerns that may arise in regards to the patient's rheumatologic disease.    Attestation Statement:   I personally performed the service. (TP)  MAYUR LOREE BLANCH, MD

## 2024-05-03 ENCOUNTER — Observation Stay
Admission: EM | Admit: 2024-05-03 | Discharge: 2024-05-06 | Disposition: A | Source: Home / Self Care | Attending: Emergency Medicine | Admitting: Emergency Medicine

## 2024-05-03 ENCOUNTER — Ambulatory Visit
Admission: RE | Admit: 2024-05-03 | Discharge: 2024-05-03 | Disposition: A | Source: Ambulatory Visit | Attending: Medical | Admitting: Medical

## 2024-05-03 ENCOUNTER — Other Ambulatory Visit
Admission: RE | Admit: 2024-05-03 | Discharge: 2024-05-03 | Disposition: A | Source: Ambulatory Visit | Attending: Medical | Admitting: Medical

## 2024-05-03 ENCOUNTER — Encounter: Payer: Self-pay | Admitting: Medical

## 2024-05-03 ENCOUNTER — Telehealth: Payer: Self-pay | Admitting: Cardiovascular Disease

## 2024-05-03 ENCOUNTER — Ambulatory Visit: Payer: Self-pay | Admitting: Medical

## 2024-05-03 ENCOUNTER — Ambulatory Visit: Admitting: Medical

## 2024-05-03 ENCOUNTER — Other Ambulatory Visit: Payer: Self-pay

## 2024-05-03 ENCOUNTER — Ambulatory Visit

## 2024-05-03 VITALS — BP 92/60 | HR 94 | Ht 71.0 in | Wt 262.4 lb

## 2024-05-03 DIAGNOSIS — I739 Peripheral vascular disease, unspecified: Secondary | ICD-10-CM

## 2024-05-03 DIAGNOSIS — R0602 Shortness of breath: Secondary | ICD-10-CM | POA: Diagnosis not present

## 2024-05-03 DIAGNOSIS — E1169 Type 2 diabetes mellitus with other specified complication: Secondary | ICD-10-CM | POA: Diagnosis present

## 2024-05-03 DIAGNOSIS — R002 Palpitations: Secondary | ICD-10-CM

## 2024-05-03 DIAGNOSIS — E782 Mixed hyperlipidemia: Secondary | ICD-10-CM | POA: Diagnosis not present

## 2024-05-03 DIAGNOSIS — G4730 Sleep apnea, unspecified: Secondary | ICD-10-CM | POA: Insufficient documentation

## 2024-05-03 DIAGNOSIS — R079 Chest pain, unspecified: Secondary | ICD-10-CM

## 2024-05-03 DIAGNOSIS — I493 Ventricular premature depolarization: Secondary | ICD-10-CM | POA: Diagnosis not present

## 2024-05-03 DIAGNOSIS — E8729 Other acidosis: Secondary | ICD-10-CM

## 2024-05-03 DIAGNOSIS — I509 Heart failure, unspecified: Principal | ICD-10-CM

## 2024-05-03 DIAGNOSIS — I25118 Atherosclerotic heart disease of native coronary artery with other forms of angina pectoris: Secondary | ICD-10-CM

## 2024-05-03 DIAGNOSIS — R399 Unspecified symptoms and signs involving the genitourinary system: Secondary | ICD-10-CM

## 2024-05-03 DIAGNOSIS — I5023 Acute on chronic systolic (congestive) heart failure: Secondary | ICD-10-CM

## 2024-05-03 DIAGNOSIS — G4733 Obstructive sleep apnea (adult) (pediatric): Secondary | ICD-10-CM | POA: Diagnosis not present

## 2024-05-03 DIAGNOSIS — I5020 Unspecified systolic (congestive) heart failure: Secondary | ICD-10-CM

## 2024-05-03 DIAGNOSIS — C61 Malignant neoplasm of prostate: Secondary | ICD-10-CM

## 2024-05-03 DIAGNOSIS — I428 Other cardiomyopathies: Secondary | ICD-10-CM

## 2024-05-03 DIAGNOSIS — I959 Hypotension, unspecified: Secondary | ICD-10-CM | POA: Diagnosis present

## 2024-05-03 LAB — D-DIMER, QUANTITATIVE: D-Dimer, Quant: 1.02 ug{FEU}/mL — ABNORMAL HIGH (ref 0.00–0.50)

## 2024-05-03 LAB — CBC
HCT: 43.7 % (ref 39.0–52.0)
HCT: 45 % (ref 39.0–52.0)
Hemoglobin: 14.2 g/dL (ref 13.0–17.0)
Hemoglobin: 14.6 g/dL (ref 13.0–17.0)
MCH: 30.8 pg (ref 26.0–34.0)
MCH: 30.9 pg (ref 26.0–34.0)
MCHC: 32.5 g/dL (ref 30.0–36.0)
MCHC: 32.4 g/dL (ref 30.0–36.0)
MCV: 94.8 fL (ref 80.0–100.0)
MCV: 95.3 fL (ref 80.0–100.0)
Platelets: 297 10*3/uL (ref 150–400)
Platelets: 295 10*3/uL (ref 150–400)
RBC: 4.61 MIL/uL (ref 4.22–5.81)
RBC: 4.72 MIL/uL (ref 4.22–5.81)
RDW: 15.4 % (ref 11.5–15.5)
RDW: 15.2 % (ref 11.5–15.5)
WBC: 10.1 10*3/uL (ref 4.0–10.5)
WBC: 10 10*3/uL (ref 4.0–10.5)
nRBC: 0 % (ref 0.0–0.2)
nRBC: 0 % (ref 0.0–0.2)

## 2024-05-03 LAB — COMPREHENSIVE METABOLIC PANEL WITH GFR
ALT: 19 U/L (ref 0–44)
AST: 28 U/L (ref 15–41)
Albumin: 3.5 g/dL (ref 3.5–5.0)
Alkaline Phosphatase: 76 U/L (ref 38–126)
Anion gap: 14 (ref 5–15)
BUN: 23 mg/dL (ref 8–23)
CO2: 21 mmol/L — ABNORMAL LOW (ref 22–32)
Calcium: 9.4 mg/dL (ref 8.9–10.3)
Chloride: 102 mmol/L (ref 98–111)
Creatinine, Ser: 1.25 mg/dL — ABNORMAL HIGH (ref 0.61–1.24)
GFR, Estimated: >60 mL/min
Glucose, Bld: 142 mg/dL — ABNORMAL HIGH (ref 70–99)
Potassium: 3.9 mmol/L (ref 3.5–5.1)
Sodium: 138 mmol/L (ref 135–145)
Total Bilirubin: 0.8 mg/dL (ref 0.0–1.2)
Total Protein: 6.8 g/dL (ref 6.5–8.1)

## 2024-05-03 LAB — BASIC METABOLIC PANEL WITH GFR
Anion gap: 16 — ABNORMAL HIGH (ref 5–15)
BUN: 23 mg/dL (ref 8–23)
CO2: 19 mmol/L — ABNORMAL LOW (ref 22–32)
Calcium: 9.4 mg/dL (ref 8.9–10.3)
Chloride: 103 mmol/L (ref 98–111)
Creatinine, Ser: 1.13 mg/dL (ref 0.61–1.24)
GFR, Estimated: >60 mL/min
Glucose, Bld: 135 mg/dL — ABNORMAL HIGH (ref 70–99)
Potassium: 3.8 mmol/L (ref 3.5–5.1)
Sodium: 137 mmol/L (ref 135–145)

## 2024-05-03 LAB — TROPONIN T, HIGH SENSITIVITY: Troponin T High Sensitivity: 22 ng/L — ABNORMAL HIGH (ref 0–19)

## 2024-05-03 LAB — PRO BRAIN NATRIURETIC PEPTIDE: Pro Brain Natriuretic Peptide: 469 pg/mL — ABNORMAL HIGH

## 2024-05-03 LAB — TSH: TSH: 2.26 u[IU]/mL (ref 0.350–4.500)

## 2024-05-03 MED ORDER — NITROGLYCERIN 0.4 MG SL SUBL
0.4000 mg | SUBLINGUAL_TABLET | SUBLINGUAL | Status: DC | PRN
  Filled 2024-05-03: qty 1

## 2024-05-03 MED ORDER — FUROSEMIDE 10 MG/ML IJ SOLN
40.0000 mg | Freq: Once | INTRAMUSCULAR | Status: AC
  Administered 2024-05-04: 40 mg via INTRAVENOUS
  Filled 2024-05-03: qty 4

## 2024-05-03 MED ORDER — ALBUTEROL SULFATE HFA 108 (90 BASE) MCG/ACT IN AERS: 2.0000 | INHALATION_SPRAY | Freq: Four times a day (QID) | RESPIRATORY_TRACT | 2 refills | Status: AC | PRN

## 2024-05-03 MED ORDER — ASPIRIN 81 MG PO CHEW
324.0000 mg | CHEWABLE_TABLET | Freq: Once | ORAL | Status: AC
  Administered 2024-05-04: 324 mg via ORAL
  Filled 2024-05-03: qty 4

## 2024-05-03 NOTE — Telephone Encounter (Signed)
 Pt c/o Shortness Of Breath: STAT if SOB developed within the last 24 hours or pt is noticeably SOB on the phone  1. Are you currently SOB (can you hear that pt is SOB on the phone)? Yes   2. How long have you been experiencing SOB? About a week   3. Are you SOB when sitting or when up moving around? Up and moving   4. Are you currently experiencing any other symptoms? Headache    Pt has been having SOB for the past week. Has not gotten my better. Please advise.

## 2024-05-03 NOTE — Progress Notes (Unsigned)
 " Cardiology Office Note   Date:  05/03/2024  ID:  Renel, Ende Dec 08, 1955, MRN [id number] PCP: Honore Sherryle RAMAN, MD  [facility] Providers Cardiologist:  Evalene Lunger, MD   History of Present Illness CORDARRIUS COAD is a 69 y.o. male with a history of nonobstructive CAD by cath 2024, HFrEF, PAD involving the aorta and iliac arteries, diabetes with peripheral neuropathy complicated by diabetic ulcer prior amputations, hypertension, hyperlipidemia, prior tobacco use quitting in 2008, lumbar fusion, and OSA who presents for follow-up of CAD.  He had normal ABIs 06/2018.  CT of the abdomen in 10/2017 showed aortic atherosclerosis extending into the iliac arteries.  He underwent Lexiscan  MPI in 12/23 which showed no significant ischemia with a small fixed region of apical thinning, mild hypokinesis of the inferior lateral wall with an EF estimated at 37%.  CT corrected images showed three-vessel coronary artery calcification and mild aortic atherosclerosis.  Overall moderate risk study due to reduced EF.  Repeat echocardiogram completed 1/24 demonstrated an EF of 45-50%, global hypokinesis, G2 DD, mild mitral regurgitation.  He underwent a right and left heart catheterization for further Lasix  to determine treatment options in 05/19/2022 which revealed mild left ventricular systolic dysfunction, EF of 35-40% by visual assessment, and there was mild nonobstructive coronary artery disease, moderately reduced LV systolic function, right heart catheterization showed normal right arterial pressure, mild pulmonary hypertension, mildly elevated wedge pressure and normal cardiac output.   He was seen in clinic 07/29/2022 at that time he was doing fairly well.  Breathing improved chest discomfort.  He was discharged with weight gain even with being on Ozempic and Jardiance .  His primary care provider has been increasing the dosage.  Entresto  24/26 mg twice daily continue Jardiance , Toprol ,  furosemide , urostomy BMP in 2 weeks to reevaluate kidney function and electrolytes.  He was also noted PVCs on his EKG that he were asymptomatic.  Repeat limited echo 11/2022 showed EF 35 to 40%, LV internal cavity size was moderately dilated. Patient was last seen 10/2023 and was stable from a cardiac perspective.    Today, the patient reports progressive SOB. Symptoms started 1 week ago. Can't walk across his house without feeling SOB. No lower leg edema. Has chest discomfort with the SOB. He reports mild lightheadedness and dizziness. He feels intermittent heart racing and palpitations.  He does not use CPAP machine. He has been taking lasix  daily without improvement.  Studies Reviewed EKG Interpretation Date/Time:  Tuesday May 03 2024 14:08:32 EST Ventricular Rate:  94 PR Interval:  194 QRS Duration:  146 QT Interval:  400 QTC Calculation: 500 R Axis:   264  Text Interpretation: Sinus rhythm with frequent Premature ventricular complexes Right bundle branch block Possible Lateral infarct (cited on or before 02-Nov-2023) Inferior infarct (cited on or before 02-Nov-2023) When compared with ECG of 02-Nov-2023 16:22, Premature ventricular complexes are now Present QRS duration has increased Questionable change in initial forces of Anterior leads T wave inversion no longer evident in Anterior leads QT has lengthened Confirmed by Franchester, Shaterrica Territo ([id number]) on 05/03/2024 2:16:28 PM    Limited echo 11/2022 1. Left ventricular ejection fraction, by estimation, is 35 to 40%. The  left ventricle has moderately decreased function. The left ventricle  demonstrates global hypokinesis. The left ventricular internal cavity size  was moderately dilated.   2. Right ventricular systolic function is normal. The right ventricular  size is normal. Tricuspid regurgitation signal is inadequate for assessing  PA pressure.  3. The mitral valve is normal in structure. No evidence of mitral valve  regurgitation. No  evidence of mitral stenosis.   4. The aortic valve has an indeterminant number of cusps. Aortic valve  regurgitation is not visualized. No aortic stenosis is present.   5. The inferior vena cava is normal in size with greater than 50%  respiratory variability, suggesting right atrial pressure of 3 mmHg.   [facility] 05/19/22   There is moderate left ventricular systolic dysfunction.   LV end diastolic pressure is moderately elevated.   The left ventricular ejection fraction is 35-45% by visual estimate.   1.  Mild nonobstructive coronary artery disease. 2.  Moderately reduced LV systolic function with an EF of 35 to 40% with global hypokinesis.  Moderately elevated left ventricular end-diastolic pressure. 3.  Right heart catheterization showed normal right atrial pressure, mild pulmonary hypertension, mildly elevated wedge pressure at 17 mmHg and normal cardiac output.   Recommendations: The patient has nonischemic cardiomyopathy for which I recommend medical therapy. Recommend advancing heart failure medications.   TTE 04/24/22 1. Left ventricular ejection fraction, by estimation, is 45 to 50%. The  left ventricle has mildly decreased function. The left ventricle  demonstrates global hypokinesis. Left ventricular diastolic parameters are  consistent with Grade II diastolic  dysfunction (pseudonormalization).   2. Right ventricular systolic function is normal. The right ventricular  size is normal. Tricuspid regurgitation signal is inadequate for assessing  PA pressure.   3. Left atrial size was moderately dilated.   4. The mitral valve is normal in structure. Mild mitral valve  regurgitation. No evidence of mitral stenosis.   5. The aortic valve is normal in structure. Aortic valve regurgitation is  mild. No aortic stenosis is present.   6. The inferior vena cava is normal in size with greater than 50%  respiratory variability, suggesting right atrial pressure of 3 mmHg.       Physical  Exam VS:  BP 92/60 (BP Location: Left Arm, Patient Position: Sitting, Cuff Size: Large)   Pulse 94   Ht 5' 11 (1.803 m)   Wt 262 lb 6.4 oz (119 kg)   SpO2 92%   BMI 36.60 kg/m        Wt Readings from Last 3 Encounters:  05/03/24 262 lb 6.4 oz (119 kg)  11/02/23 261 lb (118.4 kg)  06/24/23 263 lb 9.6 oz (119.6 kg)    GEN: Well nourished, well developed in no acute distress NECK: No JVD; No carotid bruits CARDIAC: RRR, no murmurs, rubs, gallops RESPIRATORY:  diminished at bases  ABDOMEN: Soft, non-tender, non-distended EXTREMITIES:  No edema; No deformity   ASSESSMENT AND PLAN  SOB HFrEF/NICM Patient reports 1 week of progressive shortness of breath.  He has been taking Lasix  every day without improvement.  He denies recent fevers or chills.  He has associated chest discomfort.  He has not noticed any swelling on his feet.  He denies any recent travel or leg pain.  Right and left heart cath in 2024 showed mild nonobstructive CAD, mild pulmonary hypertension.  Echo at that time showed EF of 35 to 40%.  I will update an echocardiogram.  Will order STAT CBC, c-Met, D-dimer, BNP, TSH.  I will increase Lasix  to 40 mg x 3 days then back down to Lasix  20 mg daily.  I am stopping Entresto  for hypotension.  May be able to start losartan at follow-up.  Continue spironolactone  12.5 mg daily, Jardiance  25 mg daily, Toprol  25 mg  daily.  Addendum: Ddimer 1.02, proBNP in the 400s. CXR with pulmonary edema and possible PNA. Patient was called and ER evaluation was recommended.  Nonobstructive CAD Heart cath in 2024 showed mild nonobstructive CAD.  He has chest discomfort and short of breath as above.  Plan as above.  Continue aspirin , Lipitor, Toprol .  OSA Patient reports he cannot tolerate CPAP.  Recommended he revisit this with PCP. This may be affecting his symptoms.  Palpitations PVCs PVCs noted on EKG.  I will order 1 week heart monitor to assess PVC burden.  Continue Toprol  25 mg  daily.  Hyperlipidemia Continue Lipitor 40 mg daily.  PAD No leg pain noted.  Continue aspirin  and Lipitor.       Dispo: Follow-up in 1 month  Signed, Hosea Hanawalt VEAR Fishman, PA-C   "

## 2024-05-03 NOTE — ED Triage Notes (Signed)
 Pt comes in via pov with complaints of SOB. Pt was seen at his pulmonologist, and had chest x-ray and some blood work, and was told to come to the ER. Pt complains of chest discomfort 2/10 at this time.

## 2024-05-04 ENCOUNTER — Encounter: Payer: Self-pay | Admitting: Internal Medicine

## 2024-05-04 ENCOUNTER — Telehealth: Payer: Self-pay | Admitting: Pharmacist

## 2024-05-04 ENCOUNTER — Emergency Department

## 2024-05-04 ENCOUNTER — Other Ambulatory Visit: Payer: Self-pay

## 2024-05-04 DIAGNOSIS — E8729 Other acidosis: Secondary | ICD-10-CM

## 2024-05-04 DIAGNOSIS — I5023 Acute on chronic systolic (congestive) heart failure: Secondary | ICD-10-CM | POA: Diagnosis not present

## 2024-05-04 DIAGNOSIS — G4730 Sleep apnea, unspecified: Secondary | ICD-10-CM | POA: Insufficient documentation

## 2024-05-04 DIAGNOSIS — R002 Palpitations: Secondary | ICD-10-CM | POA: Insufficient documentation

## 2024-05-04 DIAGNOSIS — R079 Chest pain, unspecified: Secondary | ICD-10-CM

## 2024-05-04 DIAGNOSIS — I509 Heart failure, unspecified: Secondary | ICD-10-CM

## 2024-05-04 LAB — DIFFERENTIAL
Abs Immature Granulocytes: 0.08 10*3/uL — ABNORMAL HIGH (ref 0.00–0.07)
Basophils Absolute: 0.1 10*3/uL (ref 0.0–0.1)
Basophils Relative: 1 %
Eosinophils Absolute: 0.8 10*3/uL — ABNORMAL HIGH (ref 0.0–0.5)
Eosinophils Relative: 7 %
Immature Granulocytes: 1 %
Lymphocytes Relative: 10 %
Lymphs Abs: 1.1 10*3/uL (ref 0.7–4.0)
Monocytes Absolute: 1.4 10*3/uL — ABNORMAL HIGH (ref 0.1–1.0)
Monocytes Relative: 13 %
Neutro Abs: 7.1 10*3/uL (ref 1.7–7.7)
Neutrophils Relative %: 68 %

## 2024-05-04 LAB — RESPIRATORY PANEL BY PCR

## 2024-05-04 LAB — BASIC METABOLIC PANEL WITH GFR
Anion gap: 13 (ref 5–15)
BUN: 23 mg/dL (ref 8–23)
CO2: 22 mmol/L (ref 22–32)
Calcium: 9 mg/dL (ref 8.9–10.3)
Chloride: 102 mmol/L (ref 98–111)
Creatinine, Ser: 1.09 mg/dL (ref 0.61–1.24)
GFR, Estimated: >60 mL/min
Glucose, Bld: 162 mg/dL — ABNORMAL HIGH (ref 70–99)
Potassium: 4.1 mmol/L (ref 3.5–5.1)
Sodium: 137 mmol/L (ref 135–145)

## 2024-05-04 LAB — MAGNESIUM: Magnesium: 2.2 mg/dL (ref 1.7–2.4)

## 2024-05-04 LAB — PRO BRAIN NATRIURETIC PEPTIDE: Pro Brain Natriuretic Peptide: 364 pg/mL — ABNORMAL HIGH

## 2024-05-04 LAB — HEMOGLOBIN A1C
Hgb A1c MFr Bld: 6.8 % — ABNORMAL HIGH (ref 4.8–5.6)
Mean Plasma Glucose: 148.46 mg/dL

## 2024-05-04 LAB — BETA-HYDROXYBUTYRIC ACID: Beta-Hydroxybutyric Acid: 0.28 mmol/L — ABNORMAL HIGH (ref 0.05–0.27)

## 2024-05-04 LAB — HIV ANTIBODY (ROUTINE TESTING W REFLEX): HIV Screen 4th Generation wRfx: NONREACTIVE

## 2024-05-04 LAB — GLUCOSE, CAPILLARY
Glucose-Capillary: 126 mg/dL — ABNORMAL HIGH (ref 70–99)
Glucose-Capillary: 209 mg/dL — ABNORMAL HIGH (ref 70–99)

## 2024-05-04 LAB — CBG MONITORING, ED
Glucose-Capillary: 161 mg/dL — ABNORMAL HIGH (ref 70–99)
Glucose-Capillary: 140 mg/dL — ABNORMAL HIGH (ref 70–99)

## 2024-05-04 LAB — TROPONIN T, HIGH SENSITIVITY: Troponin T High Sensitivity: 17 ng/L (ref 0–19)

## 2024-05-04 MED ORDER — ASPIRIN 81 MG PO TBEC
81.0000 mg | DELAYED_RELEASE_TABLET | Freq: Every day | ORAL | Status: DC
  Administered 2024-05-05 – 2024-05-06 (×2): 81 mg via ORAL
  Filled 2024-05-04 (×2): qty 1

## 2024-05-04 MED ORDER — PREDNISONE 20 MG PO TABS: 40.0000 mg | ORAL_TABLET | Freq: Every day | ORAL | Status: DC

## 2024-05-04 MED ORDER — ALBUTEROL SULFATE (2.5 MG/3ML) 0.083% IN NEBU: 2.5000 mg | INHALATION_SOLUTION | Freq: Four times a day (QID) | RESPIRATORY_TRACT | Status: DC | PRN

## 2024-05-04 MED ORDER — INSULIN GLARGINE-YFGN 100 UNIT/ML ~~LOC~~ SOLN
8.0000 [IU] | Freq: Every day | SUBCUTANEOUS | Status: DC
  Administered 2024-05-04 – 2024-05-05 (×2): 8 [IU] via SUBCUTANEOUS
  Filled 2024-05-04 (×3): qty 0.08

## 2024-05-04 MED ORDER — DM-GUAIFENESIN ER 30-600 MG PO TB12
1.0000 | ORAL_TABLET | Freq: Two times a day (BID) | ORAL | Status: DC
  Administered 2024-05-04 – 2024-05-06 (×5): 1 via ORAL
  Filled 2024-05-04 (×6): qty 1

## 2024-05-04 MED ORDER — METOPROLOL SUCCINATE ER 25 MG PO TB24
25.0000 mg | ORAL_TABLET | Freq: Every day | ORAL | Status: DC
  Administered 2024-05-05 – 2024-05-06 (×2): 25 mg via ORAL
  Filled 2024-05-04 (×3): qty 1

## 2024-05-04 MED ORDER — ONDANSETRON HCL 4 MG PO TABS: 4.0000 mg | ORAL_TABLET | Freq: Four times a day (QID) | ORAL | Status: DC | PRN

## 2024-05-04 MED ORDER — FUROSEMIDE 10 MG/ML IJ SOLN
40.0000 mg | Freq: Once | INTRAMUSCULAR | Status: AC
  Administered 2024-05-04: 40 mg via INTRAVENOUS
  Filled 2024-05-04: qty 4

## 2024-05-04 MED ORDER — ACETAMINOPHEN 325 MG PO TABS: 650.0000 mg | ORAL_TABLET | Freq: Four times a day (QID) | ORAL | Status: DC | PRN

## 2024-05-04 MED ORDER — INSULIN ASPART 100 UNIT/ML IJ SOLN
0.0000 [IU] | Freq: Every day | INTRAMUSCULAR | Status: DC
  Administered 2024-05-04: 2 [IU] via SUBCUTANEOUS
  Administered 2024-05-05: 3 [IU] via SUBCUTANEOUS
  Filled 2024-05-04: qty 2
  Filled 2024-05-04: qty 3

## 2024-05-04 MED ORDER — ALBUTEROL SULFATE HFA 108 (90 BASE) MCG/ACT IN AERS: 2.0000 | INHALATION_SPRAY | Freq: Four times a day (QID) | RESPIRATORY_TRACT | Status: DC | PRN

## 2024-05-04 MED ORDER — SPIRONOLACTONE 12.5 MG HALF TABLET
12.5000 mg | ORAL_TABLET | Freq: Every day | ORAL | Status: DC
  Administered 2024-05-04: 12.5 mg via ORAL
  Filled 2024-05-04: qty 1

## 2024-05-04 MED ORDER — HYDROCODONE-ACETAMINOPHEN 5-325 MG PO TABS
1.0000 | ORAL_TABLET | ORAL | Status: DC | PRN
  Administered 2024-05-05: 1 via ORAL
  Filled 2024-05-04: qty 1

## 2024-05-04 MED ORDER — ACETAMINOPHEN 650 MG RE SUPP: 650.0000 mg | Freq: Four times a day (QID) | RECTAL | Status: DC | PRN

## 2024-05-04 MED ORDER — IPRATROPIUM-ALBUTEROL 0.5-2.5 (3) MG/3ML IN SOLN: 3.0000 mL | RESPIRATORY_TRACT | Status: DC | PRN

## 2024-05-04 MED ORDER — ENOXAPARIN SODIUM 60 MG/0.6ML IJ SOSY
60.0000 mg | PREFILLED_SYRINGE | INTRAMUSCULAR | Status: DC
  Administered 2024-05-04 – 2024-05-06 (×3): 60 mg via SUBCUTANEOUS
  Filled 2024-05-04 (×3): qty 0.6

## 2024-05-04 MED ORDER — TAMSULOSIN HCL 0.4 MG PO CAPS
0.4000 mg | ORAL_CAPSULE | Freq: Every day | ORAL | Status: DC
  Administered 2024-05-04 – 2024-05-06 (×3): 0.4 mg via ORAL
  Filled 2024-05-04 (×3): qty 1

## 2024-05-04 MED ORDER — INSULIN ASPART 100 UNIT/ML IJ SOLN
0.0000 [IU] | Freq: Three times a day (TID) | INTRAMUSCULAR | Status: DC
  Administered 2024-05-04 (×2): 2 [IU] via SUBCUTANEOUS
  Administered 2024-05-04 – 2024-05-05 (×2): 3 [IU] via SUBCUTANEOUS
  Administered 2024-05-05 – 2024-05-06 (×3): 5 [IU] via SUBCUTANEOUS
  Administered 2024-05-06: 3 [IU] via SUBCUTANEOUS
  Filled 2024-05-04 (×2): qty 5
  Filled 2024-05-04: qty 3
  Filled 2024-05-04 (×2): qty 2
  Filled 2024-05-04: qty 5
  Filled 2024-05-04 (×2): qty 3

## 2024-05-04 MED ORDER — ATORVASTATIN CALCIUM 20 MG PO TABS
40.0000 mg | ORAL_TABLET | Freq: Every day | ORAL | Status: DC
  Administered 2024-05-04 – 2024-05-05 (×2): 40 mg via ORAL
  Filled 2024-05-04 (×2): qty 2

## 2024-05-04 MED ORDER — PREDNISONE 20 MG PO TABS
40.0000 mg | ORAL_TABLET | Freq: Every day | ORAL | Status: DC
  Administered 2024-05-04 – 2024-05-06 (×3): 40 mg via ORAL
  Filled 2024-05-04 (×3): qty 2

## 2024-05-04 MED ORDER — IOHEXOL 350 MG/ML SOLN
100.0000 mL | Freq: Once | INTRAVENOUS | Status: AC | PRN
  Administered 2024-05-04: 75 mL via INTRAVENOUS

## 2024-05-04 MED ORDER — INSULIN DEGLUDEC 100 UNIT/ML ~~LOC~~ SOPN: 8.0000 [IU] | PEN_INJECTOR | Freq: Every day | SUBCUTANEOUS | Status: DC

## 2024-05-04 MED ORDER — ONDANSETRON HCL 4 MG/2ML IJ SOLN: 4.0000 mg | Freq: Four times a day (QID) | INTRAMUSCULAR | Status: DC | PRN

## 2024-05-04 NOTE — Progress Notes (Signed)
 Heart Failure Nurse Navigator Progress Note  PCP: Honore Sherryle RAMAN, MD PCP-Cardiologist: Evalene Lunger, MD Admission Diagnosis: Acute on chronic congestive heart failure, unspecified heart failure type [facility]) Admitted from: Cardiology office  Presentation:   Christopher Escobar is a 69 y.o. male. History of CAD, HTN, Diabetes and CHF. He presented with complaints of chest pain, shortness of breath worse with exertion.  Had a CXR from cardiologist appointment and showed pulmonary edema an and elevated D-dimer. Has been taking his medications.  Pro-BNP 364.  HS Troponin 22.   ECHO/ LVEF: Results Pending form 05/03/24  Clinical Course:  Past Medical History:  Diagnosis Date   CAD (coronary artery disease)    Diabetes mellitus with complication (HCC) 2010   ED (erectile dysfunction)    HFrEF (heart failure with reduced ejection fraction) (HCC)    Hypertension 2008   PAD (peripheral artery disease)    Personal history of colonic polyps    TA polyp   Sleep apnea    not using cpap     Social History   Socioeconomic History   Marital status: Married    Spouse name: Not on file   Number of children: Not on file   Years of education: Not on file   Highest education level: Not on file  Occupational History   Not on file  Tobacco Use   Smoking status: Former    Current packs/day: 0.00    Average packs/day: 1 pack/day for 30.0 years (30.0 ttl pk-yrs)    Types: Cigarettes    Start date: 02/02/1977    Quit date: 02/03/2007    Years since quitting: 17.2    Passive exposure: Past   Smokeless tobacco: Never  Vaping Use   Vaping status: Never Used  Substance and Sexual Activity   Alcohol use: Yes    Comment: seldom   Drug use: No   Sexual activity: Yes    Birth control/protection: None  Other Topics Concern   Not on file  Social History Narrative   Lives with wife, Myrick.   Social Drivers of Health   Tobacco Use: Medium Risk (05/04/2024)   Patient History    Smoking Tobacco  Use: Former    Smokeless Tobacco Use: Never    Passive Exposure: Past  Physicist, Medical Strain: Medium Risk (09/04/2023)   Received from [facility]   Overall Financial Resource Strain (CARDIA)    Difficulty of Paying Living Expenses: Somewhat hard  Food Insecurity: No Food Insecurity (05/04/2024)   Epic    Worried About Running Out of Food in the Last Year: Never true    Ran Out of Food in the Last Year: Never true  Transportation Needs: No Transportation Needs (05/04/2024)   Epic    Lack of Transportation (Medical): No    Lack of Transportation (Non-Medical): No  Physical Activity: Not on file  Stress: Not on file  Social Connections: Not on file  Depression (EYV7-0): Not on file  Alcohol Screen: Not on file  Housing: Unknown (05/04/2024)   Epic    Unable to Pay for Housing in the Last Year: No    Number of Times Moved in the Last Year: Not on file    Homeless in the Last Year: No  Utilities: Not At Risk (09/04/2023)   Received from [facility] Utilities    Threatened with loss of utilities: No  Health Literacy: Not on file  Education Assessment and Provision:  Detailed education and instructions  provided on heart failure disease management including the following:  Signs and symptoms of Heart Failure When to call the physician Importance of daily weights Low sodium diet Fluid restriction Medication management Anticipated future follow-up appointments  Patient education given on each of the above topics.  Patient acknowledges understanding via teach back method and acceptance of all instructions.  Education Materials:  Living Better With Heart Failure Booklet, HF zone tool, & Daily Weight Tracker Tool.  Patient has scale at home: Yes Patient has pill box at home: Yes    High Risk Criteria for Readmission and/or Poor Patient Outcomes: Heart failure hospital admissions (last 6 months): 1  No Show rate: 3% Difficult social  situation: None determined at this time. Demonstrates medication adherence: Yes Primary Language: English Literacy level: Reading, Writing & Comprehension  Barriers of Care:   Daily Weights-patient is not currently doing daily weights Diet & Fluid Restrictions-patient is not monitoring daily salt or fluid intake.  He is the one that cooks the meals at home right now.  Considerations/Referrals:  Referral made to Heart Failure Pharmacist Stewardship: Yes Referral made to Heart Failure CSW/NCM TOC: No Referral made to Heart & Vascular TOC clinic: Yes. AHF Clinic 05/18/24 @ 11:30  Items for Follow-up on DC/TOC: Daily Weights Diet & Fluid Restrictions Continued Heart Failure Education  Charmaine Pines, RN, BSN [facility] Heart Failure Navigator Secure Chat Only

## 2024-05-04 NOTE — Progress Notes (Signed)
 " Progress Note   Patient: Christopher Escobar FMW:[id number] DOB: 1956/03/17 DOA: 05/03/2024     0 DOS: the patient was seen and examined on 05/04/2024   Brief hospital course: Per H&P HPI    Christopher Escobar is a 69 y.o. male with medical history significant for Nonobstructive CAD, PAD with prior amputations, HTN, DM, OSA not on CPAP,, HFrEF(EF 35-40%), seen earlier in the day by his cardiologist with a week of dyspnea on exertion associated with chest discomfort, being admitted with CHF exacerbation associated with chest pain, likely nonanginal.  He presents with persistent complaint voiced earlier to cardiology of shortness of breath and chest discomfort.  At his appointment earlier in the day on 05/03/2024 his Lasix  dose was doubled for 3 days and he was continued on his other GDMT except for Entresto  which was stopped due to soft blood pressure of 92/60.  He also complained of palpitations, EKG showed PVCs so he 1 week Holter was ordered as well as an echo.  He was given a 1 month follow-up. In the ED mildly tachypneic intermittently hypotensive to 96/56, with O2 sat in the mid 90s on room air. Troponin 22-17 with proBNP 364. BMP with glucose 135, anion gap 16 and bicarb 19 CBC WNL EKG showed undetermined rhythm at 98 with RBBB and PVCs CTA chest PE protocol negative for PE but showed minimal pleural effusion, generalized ground glass haziness upper lobes with varying differentials--please see report   Patient treated with chewable aspirin  324 mg given a dose of Lasix  40 mg as well as nitroglycerin  sublingual   Admission requested   Assessment and Plan: * Acute on chronic HFrEF (heart failure with reduced ejection fraction) (HCC) TTE 11/2022 EF 35 to 40% Presents with exertional dyspnea, proBNP 364, minimal pleural effusion on CTA chest with possible airspace disease upper lobes IV Lasix  Hold entresto  and spironolactone  due to low normal blood pressure -Continue IV lasix  -Continue  metoprolol   Regarding other consideration for dyspnea. CTA chest negative for PE. Viral infection, patient p/w cough and rhinorrhea and imaging with ground glass opacities. Also considered COPD exacerbation, patient not on inhalers at home and no diagnosis however has smoking history and CT chest evidence of emphysema.  -Start steroids   Chest pain History of nonobstructive CAD on [facility] 2024 PAD with prior amputation Chest pain resolved this AM. EKG without evidence of acute ischemaTroponin with non-ACS trend 22-17 Continue aspirin  and Lipitor  High anion gap metabolic acidosis resolved Resume Jardiance  in the AM   PVCs No palpitations this AM.  Noted on tele. Did not receive metoprolol .  -Continue metoprolol    Sleep apnea Does not use CPAP  Type 2 diabetes mellitus with hyperlipidemia (HCC) A1c 6.8 controlled continue basal insulin  Sliding scale insulin  coverage   Class 2 Obesity  BMI 36 Complicates care.      Subjective: Patient feels his breathing has improved. No chest pain. Prior to admission, developed dyspnea about a week ago. Progressively worse since that time, could not make it from one room to the next prompting ED visit.   Physical Exam: Vitals:   05/04/24 1130 05/04/24 1250 05/04/24 1315 05/04/24 1327  BP: (!) 103/54 (!) 112/57  (!) 148/62  Pulse: 91 (!) 103  84  Resp: (!) 23 (!) 24  18  Temp:   97.7 F (36.5 C) 97.9 F (36.6 C)  TempSrc:   Oral Oral  SpO2: 93% 93%  93%  Weight:      Height:  Physical Exam  Constitutional: In no distress.  Cardiovascular: Normal rate, regular rhythm. No lower extremity edema  Pulmonary: Non labored breathing on room air, no wheezing, Rales at bilateral bases.    Abdominal: Soft. Non distended and non tender Musculoskeletal: Normal range of motion.     Neurological: Alert and oriented to person, place, and time. Non focal  Skin: Skin is warm and dry.   Data Reviewed:     Latest Ref Rng & Units 05/04/2024     8:09 AM 05/03/2024    5:11 PM 05/03/2024    3:32 PM  BMP  Glucose 70 - 99 mg/dL 837  864  857   BUN 8 - 23 mg/dL 23  23  23    Creatinine 0.61 - 1.24 mg/dL 8.90  8.86  8.74   Sodium 135 - 145 mmol/L 137  137  138   Potassium 3.5 - 5.1 mmol/L 4.1  3.8  3.9   Chloride 98 - 111 mmol/L 102  103  102   CO2 22 - 32 mmol/L 22  19  21    Calcium  8.9 - 10.3 mg/dL 9.0  9.4  9.4       Latest Ref Rng & Units 05/03/2024    5:11 PM 05/03/2024    3:32 PM 05/19/2022   10:16 AM  CBC  WBC 4.0 - 10.5 K/uL 10.0  10.1    Hemoglobin 13.0 - 17.0 g/dL 85.3  85.7  82.6   Hematocrit 39.0 - 52.0 % 45.0  43.7  51.0   Platelets 150 - 400 K/uL 295  297       Family Communication: Spouse   Disposition: Status is: Observation The patient remains OBS appropriate and will d/c before 2 midnights.  Planned Discharge Destination: Home    Time spent: 35 minutes  Author: Alban Pepper, MD 05/04/2024 3:49 PM  For on call review [email or web address].  "

## 2024-05-04 NOTE — Assessment & Plan Note (Signed)
 Does not use CPAP

## 2024-05-04 NOTE — Consult Note (Signed)
 "  Cardiology Consultation   Patient ID: GRANTHAM HIPPERT MRN: [id number]; DOB: [birth year]  Admit date: 05/03/2024 Date of Consult: 05/04/2024  PCP:  Honore Sherryle RAMAN, MD   [facility] Providers Cardiologist:  Evalene Lunger, MD        Patient Profile: Christopher Escobar is a 69 y.o. male with a hx of nonobstructive CAD by cath in 2024, HFrEF, PAD involving the aorta and iliac arteries, diabetes with peripheral neuropathy complicated by diabetic ulcer and prior amputations.  Hypertension, hyperlipidemia, prior tobacco use (quit 2008), lumbar fusion, and OSA who is being seen 05/04/2024 for the evaluation of acute on chronic CHF at the request of Dr. Franchot.  History of Present Illness:   Christopher Escobar patient had abnormal ABIs 06/2018.  CT of the abdomen and 10/2017 showed aortic atherosclerosis extending into the iliac arteries.  He underwent Lexiscan  MPI 02/2022 which showed no significant ischemia with small fixed region of apical thinning, mild hypokinesis of the inferior wall with an EF estimated at 37%.  CT overread showed three-vessel CAD with mild aortic atherosclerosis.  Overall study was felt to be moderate risk due to reduced EF.  Repeat echo 03/2022 demonstrated EF 40 to 45% with global hypokinesis, G2 DD, and mild MR.  He underwent right and left heart catheterization 05/2022 which revealed mild left ventricular systolic dysfunction with EF of 35 to 40% by visual estimate, mild nonobstructive CAD, moderately reduced LV systolic function, with right heart catheterization showing normal right arterial pressure, mild pulmonary hypertension, mildly elevated wedge pressure, and normal cardiac output.  Most recent echo 11/2022 revealed EF 35 to 40% with global hypokinesis.    Patient was seen in the cardiology office 05/03/2024 endorsing progressive shortness of breath over the past week.  Reported on being able to walk across his house without feeling short of breath.  Also endorsed chest  discomfort with exertion.  Reported mild lightheadedness and dizziness an intermittent heart racing/palpitations.  Denied lower extremity swelling.  Workup was obtained including a proBNP which was in the 400s, renal function slightly above baseline.  Chest x-ray showing pulmonary edema and possible pneumonia, and a positive D-dimer.  He was referred to the ED for further evaluation.  In the ED, he was normotensive with a heart rate of 94 bpm and otherwise normal vital signs.  Renal function had improved to baseline with creatinine 1.13 and BUN of 23 with otherwise unremarkable BMP and CBC.  proBNP had improved slightly to 364.  Troponin 22 > 17.  EKG without acute ischemic changes.  CT angiogram negative for PE and showed generalized ground glass haziness in the upper lobes which was felt to reflect possible air trapping, edema, infection, or chronic interstitial lung disease with bilateral new minimal pleural effusions.  He was placed on 2 L nasal cannula for comfort.  Received IV Lasix  40 mg x 2.  Cardiology was asked to consult for further evaluation of acute on chronic heart failure.  At time of cardiology consult, patient reports slight improvement in dyspnea.  Past Medical History:  Diagnosis Date   CAD (coronary artery disease)    Diabetes mellitus with complication (HCC) 2010   ED (erectile dysfunction)    HFrEF (heart failure with reduced ejection fraction) (HCC)    Hypertension 2008   PAD (peripheral artery disease)    Personal history of colonic polyps    TA polyp   Sleep apnea    not using cpap    Past Surgical History:  Procedure Laterality Date   AMPUTATION Right 09/15/2019   Procedure: AMPUTATION RIGHT FOOT 2ND DIGIT;  Surgeon: Tobie Franky SQUIBB, DPM;  Location: [facility] OR;  Service: Podiatry;  Laterality: Right;   AMPUTATION Left 02/08/2021   Procedure: AMPUTATION RAY-Partial 5th Ray;  Surgeon: Janit Thresa HERO, DPM;  Location: [facility] ORS;  Service: Podiatry;  Laterality: Left;   BACK  SURGERY  1989   COLONOSCOPY  2013   COLONOSCOPY WITH PROPOFOL  N/A 10/28/2016   Procedure: COLONOSCOPY WITH PROPOFOL ;  Surgeon: Dessa Reyes ORN, MD;  Location: [facility] ENDOSCOPY;  Service: Endoscopy;  Laterality: N/A;   RIGHT/LEFT HEART CATH AND CORONARY ANGIOGRAPHY Bilateral 05/19/2022   Procedure: RIGHT/LEFT HEART CATH AND CORONARY ANGIOGRAPHY;  Surgeon: Darron Deatrice LABOR, MD;  Location: [facility] INVASIVE CV LAB;  Service: Cardiovascular;  Laterality: Bilateral;   SHOULDER ARTHROSCOPY WITH SUBACROMIAL DECOMPRESSION Left 04/02/2017   Procedure: SHOULDER ARTHROSCOPY WITH DECOMPRESSION, DEBRIDEMENT, SLAP REPAIR,;  Surgeon: Edie Norleen PARAS, MD;  Location: [facility] ORS;  Service: Orthopedics;  Laterality: Left;   SHOULDER SURGERY Bilateral 2011,2012       Scheduled Meds:  [START ON 05/05/2024] aspirin  EC  81 mg Oral Daily   dextromethorphan -guaiFENesin   1 tablet Oral BID   enoxaparin  (LOVENOX ) injection  60 mg Subcutaneous Q24H   insulin  aspart  0-15 Units Subcutaneous TID WC   insulin  aspart  0-5 Units Subcutaneous QHS   insulin  glargine-yfgn  8 Units Subcutaneous QHS   metoprolol  succinate  25 mg Oral Q breakfast   spironolactone   12.5 mg Oral Daily   tamsulosin   0.4 mg Oral Daily   Continuous Infusions:  PRN Meds: acetaminophen  **OR** acetaminophen , HYDROcodone -acetaminophen , ipratropium-albuterol , nitroGLYCERIN , ondansetron  **OR** ondansetron  (ZOFRAN ) IV  Allergies:   Allergies[1]  Social History:   Social History   Socioeconomic History   Marital status: Married    Spouse name: Not on file   Number of children: Not on file   Years of education: Not on file   Highest education level: Not on file  Occupational History   Not on file  Tobacco Use   Smoking status: Former    Current packs/day: 0.00    Average packs/day: 1 pack/day for 30.0 years (30.0 ttl pk-yrs)    Types: Cigarettes    Start date: 02/02/1977    Quit date: 02/03/2007    Years since quitting: 17.2    Passive exposure: Past    Smokeless tobacco: Never  Vaping Use   Vaping status: Never Used  Substance and Sexual Activity   Alcohol use: Yes    Comment: seldom   Drug use: No   Sexual activity: Yes    Birth control/protection: None  Other Topics Concern   Not on file  Social History Narrative   Lives with wife, Myrick.   Social Drivers of Health   Tobacco Use: Medium Risk (05/03/2024)   Patient History    Smoking Tobacco Use: Former    Smokeless Tobacco Use: Never    Passive Exposure: Past  Physicist, Medical Strain: Medium Risk (09/04/2023)   Received from [facility]   Overall Financial Resource Strain (CARDIA)    Difficulty of Paying Living Expenses: Somewhat hard  Food Insecurity: No Food Insecurity (09/04/2023)   Received from [facility]   Epic    Within the past 12 months, you worried that your food would run out before you got the money to buy more.: Never true    Within the past 12 months, the food you bought just didn't last and  you didn't have money to get more.: Never true  Transportation Needs: No Transportation Needs (09/04/2023)   Received from [facility] - Transportation    In the past 12 months, has lack of transportation kept you from medical appointments or from getting medications?: No    Lack of Transportation (Non-Medical): No  Physical Activity: Not on file  Stress: Not on file  Social Connections: Not on file  Intimate Partner Violence: Not on file  Depression (EYV7-0): Not on file  Alcohol Screen: Not on file  Housing: Low Risk  (09/04/2023)   Received from [facility]   Epic    In the last 12 months, was there a time when you were not able to pay the mortgage or rent on time?: No    In the past 12 months, how many times have you moved where you were living?: 0    At any time in the past 12 months, were you homeless or living in a shelter (including now)?: No  Utilities: Not At Risk (09/04/2023)    Received from [facility] Utilities    Threatened with loss of utilities: No  Health Literacy: Not on file    Family History:    Family History  Problem Relation Age of Onset   Heart attack Father 17   Heart attack Brother 50   Heart attack Brother 52     ROS:  Please see the history of present illness.  Physical Exam/Data: Vitals:   05/04/24 0800 05/04/24 0842 05/04/24 1100 05/04/24 1130  BP: 104/67  106/68 (!) 103/54  Pulse: 92  90 91  Resp: 17  (!) 22 (!) 23  Temp:  97.9 F (36.6 C)    TempSrc:  Oral    SpO2: 95%  91% 93%  Weight:      Height:       No intake or output data in the 24 hours ending 05/04/24 1232    05/03/2024    5:09 PM 05/03/2024    2:01 PM 11/02/2023    4:13 PM  Last 3 Weights  Weight (lbs) 262 lb 5.6 oz 262 lb 6.4 oz 261 lb  Weight (kg) 119 kg 119.024 kg 118.389 kg     Body mass index is 36.59 kg/m.  General:  Well nourished, well developed, in no acute distress HEENT: normal Neck: no JVD Vascular: No carotid bruits; Distal pulses 2+ bilaterally Cardiac:  normal S1, S2; RRR; no murmur  Lungs: crackles at the bases bilaterally, right worse than left Abd: soft, nontender, no hepatomegaly  Ext: trace LE edema Skin: warm and dry  Psych:  Normal affect   EKG:  The EKG was personally reviewed and demonstrates: Sinus rhythm with RBBB and frequent PVCs, rate 99 bpm Telemetry:  Telemetry was personally reviewed and demonstrates:  sinus rhythm/sinus tachycardia with PVCs  Relevant CV Studies:  11/2022 2D echo 1. Left ventricular ejection fraction, by estimation, is 35 to 40%. The  left ventricle has moderately decreased function. The left ventricle  demonstrates global hypokinesis. The left ventricular internal cavity size  was moderately dilated.   2. Right ventricular systolic function is normal. The right ventricular  size is normal. Tricuspid regurgitation signal is inadequate for assessing  PA pressure.   3. The  mitral valve is normal in structure. No evidence of mitral valve  regurgitation. No evidence of mitral stenosis.   4. The aortic valve has an indeterminant number of  cusps. Aortic valve  regurgitation is not visualized. No aortic stenosis is present.   5. The inferior vena cava is normal in size with greater than 50%  respiratory variability, suggesting right atrial pressure of 3 mmHg.   Laboratory Data: High Sensitivity Troponin:  No results for input(s): TROPONINIHS in the last 720 hours.  Recent Labs  Lab 05/03/24 1711 05/03/24 2342  TRNPT 22* 17      Chemistry Recent Labs  Lab 05/03/24 1532 05/03/24 1711 05/04/24 0809  NA 138 137 137  K 3.9 3.8 4.1  CL 102 103 102  CO2 21* 19* 22  GLUCOSE 142* 135* 162*  BUN 23 23 23   CREATININE 1.25* 1.13 1.09  CALCIUM  9.4 9.4 9.0  MG  --   --  2.2  GFRNONAA >60 >60 >60  ANIONGAP 14 16* 13    Recent Labs  Lab 05/03/24 1532  PROT 6.8  ALBUMIN 3.5  AST 28  ALT 19  ALKPHOS 76  BILITOT 0.8   Lipids No results for input(s): CHOL, TRIG, HDL, LABVLDL, LDLCALC, CHOLHDL in the last 168 hours.  Hematology Recent Labs  Lab 05/03/24 1532 05/03/24 1711  WBC 10.1 10.0  RBC 4.61 4.72  HGB 14.2 14.6  HCT 43.7 45.0  MCV 94.8 95.3  MCH 30.8 30.9  MCHC 32.5 32.4  RDW 15.4 15.2  PLT 297 295   Thyroid  Recent Labs  Lab 05/03/24 1532  TSH 2.260    BNP Recent Labs  Lab 05/03/24 1532 05/03/24 2342  PROBNP 469.0* 364.0*    DDimer  Recent Labs  Lab 05/03/24 1532  DDIMER 1.02*    Radiology/Studies:  CT Angio Chest PE W and/or Wo Contrast Result Date: 05/04/2024 IMPRESSION: 1. No evidence of pulmonary embolism. Pulmonary arteries are at the upper limit of normal caliber. 2. Generalized ground-glass haziness in the upper lobes, which may reflect air trapping, edema, infection, or chronic interstitial lung disease. 3. Diffuse bronchial wall thickening with occasional subsegmental bronchial impactions and mild  dependent atelectasis. 4. Bilateral new minimal pleural effusions. 5. Two left upper lobe solid pulmonary nodules measuring 6 mm each; recommend non-contrast chest CT at 3-6 months and again at 18-24 months, as per Fleischner Society Guidelines. 6. Pulmonary emphysema is an independent risk factor for lung cancer. Recommend consideration for evaluation for a low-dose CT lung cancer screening program. 7. Cardiomegaly and 3-vessel coronary artery disease. 8. Small left adrenal myelolipoma. Electronically signed by: Francis Quam MD 05/04/2024 02:04 AM EST RP Workstation: [id number]   DG Chest 2 View Result Date: 05/03/2024 IMPRESSION: Pulmonary edema. Patchy consolidation of left lung base, developing pneumonia is not excluded. Electronically Signed   By: Craig Farr M.D.   On: 05/03/2024 15:59    Assessment and Plan:  Acute on chronic HFrEF - Most recent echo 11/2022 with EF 35 to 40% and global hypokinesis - Seen in the cardiology office with 1 week of worsening dyspnea on exertion, found to have elevated proBNP and positive D-dimer and was subsequently referred to the ED for evaluation - Chest CTA negative for PE but did show findings consistent with edema and bilateral pleural effusions - Received IV Lasix  40 mg x 2 in the ED - Appears mildly volume overloaded on exam - Continue IV Lasix  40 mg mg twice daily - Continue to monitor kidney function, strict I/Os, and daily weights with ongoing diuresis - Repeat echo pending - BP running low, Entresto  and spironolactone  held.  Jardiance  will be resumed in the morning.  Continue metoprolol .  Acute hypoxic respiratory failure - Requiring 2 L of supplemental oxygen on admission, now weaned to room air - Respiratory panel pending  Nonobstructive CAD - Heart cath in 2024 showed mild nonobstructive CAD - Did report some exertional chest discomfort - EKG without acute ischemic changes - Troponin negative - Echo ordered, further recommendations  pending results - Continue aspirin , Lipitor, and metoprolol   OSA - Noncompliant with CPAP  PVCs - Frequent PVCs noted on telemetry and EKG - Continue metoprolol  as above  Hyperlipidemia - Continue Lipitor as above    For questions or updates, please contact [facility] Please consult [email or web address] for contact info under      Signed, Lesley LITTIE Maffucci, PA-C  05/04/2024 12:32 PM     [1] No Known Allergies  "

## 2024-05-04 NOTE — Hospital Course (Addendum)
 Cough and runny nose. Taking theraflu.

## 2024-05-04 NOTE — ED Notes (Signed)
 NURSE ISAAC RN INFORMED OF BED ASSIGNED

## 2024-05-04 NOTE — Plan of Care (Signed)
 VSS. RA. Patient arrived from ED this afternoon. Family at bedside this shift. Respiratory panel pending. Echo ordered.   Problem: Education: Goal: Ability to describe self-care measures that may prevent or decrease complications (Diabetes Survival Skills Education) will improve Outcome: Progressing Goal: Individualized Educational Video(s) Outcome: Progressing   Problem: Coping: Goal: Ability to adjust to condition or change in health will improve Outcome: Progressing   Problem: Fluid Volume: Goal: Ability to maintain a balanced intake and output will improve Outcome: Progressing   Problem: Health Behavior/Discharge Planning: Goal: Ability to identify and utilize available resources and services will improve Outcome: Progressing Goal: Ability to manage health-related needs will improve Outcome: Progressing   Problem: Metabolic: Goal: Ability to maintain appropriate glucose levels will improve Outcome: Progressing   Problem: Nutritional: Goal: Maintenance of adequate nutrition will improve Outcome: Progressing Goal: Progress toward achieving an optimal weight will improve Outcome: Progressing   Problem: Skin Integrity: Goal: Risk for impaired skin integrity will decrease Outcome: Progressing   Problem: Tissue Perfusion: Goal: Adequacy of tissue perfusion will improve Outcome: Progressing   Problem: Education: Goal: Knowledge of General Education information will improve Description: Including pain rating scale, medication(s)/side effects and non-pharmacologic comfort measures Outcome: Progressing   Problem: Health Behavior/Discharge Planning: Goal: Ability to manage health-related needs will improve Outcome: Progressing   Problem: Clinical Measurements: Goal: Ability to maintain clinical measurements within normal limits will improve Outcome: Progressing Goal: Will remain free from infection Outcome: Progressing Goal: Diagnostic test results will  improve Outcome: Progressing Goal: Respiratory complications will improve Outcome: Progressing Goal: Cardiovascular complication will be avoided Outcome: Progressing   Problem: Activity: Goal: Risk for activity intolerance will decrease Outcome: Progressing   Problem: Nutrition: Goal: Adequate nutrition will be maintained Outcome: Progressing   Problem: Coping: Goal: Level of anxiety will decrease Outcome: Progressing   Problem: Elimination: Goal: Will not experience complications related to bowel motility Outcome: Progressing Goal: Will not experience complications related to urinary retention Outcome: Progressing   Problem: Pain Managment: Goal: General experience of comfort will improve and/or be controlled Outcome: Progressing   Problem: Safety: Goal: Ability to remain free from injury will improve Outcome: Progressing   Problem: Skin Integrity: Goal: Risk for impaired skin integrity will decrease Outcome: Progressing

## 2024-05-04 NOTE — Assessment & Plan Note (Addendum)
 Last EF 35 to 40% Presents with exertional dyspnea, proBNP 364, minimal pleural effusion on CTA chest with possible airspace disease upper lobes IV Lasix  Continue metoprolol , spironolactone  Holding Jardiance  due to mild anion gap metabolic acidosis pending UA Entresto  held earlier in the day by cardiology due to soft blood pressures Will update echocardiogram Daily weights with intake and output monitoring

## 2024-05-04 NOTE — Assessment & Plan Note (Signed)
 History of nonobstructive CAD on [facility] 2024 PAD with prior amputation Troponin with non-ACS trend 22-17 Continue aspirin  and Lipitor

## 2024-05-04 NOTE — Assessment & Plan Note (Signed)
Continue basal insulin.  Sliding scale insulin coverage

## 2024-05-04 NOTE — Assessment & Plan Note (Signed)
 Euglycemic DKA not suspected Blood glucose 135 with anion gap 16 bicarb 19 Will get a urinalysis to evaluate for glucosuria We will get beta hydroxybutyric acid Will hold Jardiance 

## 2024-05-04 NOTE — H&P (Signed)
 " History and Physical    Patient: Christopher Escobar FMW:[id number] DOB: 09-12-1955 DOA: 05/03/2024 DOS: the patient was seen and examined on 05/04/2024 PCP: Honore Sherryle RAMAN, MD  Patient coming from: Home  Chief Complaint:  Chief Complaint  Patient presents with   Shortness of Breath    HPI: Christopher Escobar is a 69 y.o. male with medical history significant for Nonobstructive CAD, PAD with prior amputations, HTN, DM, OSA not on CPAP,, HFrEF(EF 35-40%), seen earlier in the day by his cardiologist with a week of dyspnea on exertion associated with chest discomfort, being admitted with CHF exacerbation associated with chest pain, likely nonanginal.  He presents with persistent complaint voiced earlier to cardiology of shortness of breath and chest discomfort.  At his appointment earlier in the day on 05/03/2024 his Lasix  dose was doubled for 3 days and he was continued on his other GDMT except for Entresto  which was stopped due to soft blood pressure of 92/60.  He also complained of palpitations, EKG showed PVCs so he 1 week Holter was ordered as well as an echo.  He was given a 1 month follow-up. In the ED mildly tachypneic intermittently hypotensive to 96/56, with O2 sat in the mid 90s on room air. Troponin 22-17 with proBNP 364. BMP with glucose 135, anion gap 16 and bicarb 19 CBC WNL EKG showed undetermined rhythm at 98 with RBBB and PVCs CTA chest PE protocol negative for PE but showed minimal pleural effusion, generalized ground glass haziness upper lobes with varying differentials--please see report  Patient treated with chewable aspirin  324 mg given a dose of Lasix  40 mg as well as nitroglycerin  sublingual  Admission requested     Past Medical History:  Diagnosis Date   CAD (coronary artery disease)    Diabetes mellitus with complication (HCC) 2010   ED (erectile dysfunction)    HFrEF (heart failure with reduced ejection fraction) (HCC)    Hypertension 2008   PAD (peripheral  artery disease)    Personal history of colonic polyps    TA polyp   Sleep apnea    not using cpap   Past Surgical History:  Procedure Laterality Date   AMPUTATION Right 09/15/2019   Procedure: AMPUTATION RIGHT FOOT 2ND DIGIT;  Surgeon: Tobie Franky SQUIBB, DPM;  Location: [facility];  Service: Podiatry;  Laterality: Right;   AMPUTATION Left 02/08/2021   Procedure: AMPUTATION RAY-Partial 5th Ray;  Surgeon: Janit Thresa CHRISTELLA, DPM;  Location: [facility] ORS;  Service: Podiatry;  Laterality: Left;   BACK SURGERY  1989   COLONOSCOPY  2013   COLONOSCOPY WITH PROPOFOL  N/A 10/28/2016   Procedure: COLONOSCOPY WITH PROPOFOL ;  Surgeon: Dessa Reyes ORN, MD;  Location: [facility];  Service: Endoscopy;  Laterality: N/A;   RIGHT/LEFT HEART CATH AND CORONARY ANGIOGRAPHY Bilateral 05/19/2022   Procedure: RIGHT/LEFT HEART CATH AND CORONARY ANGIOGRAPHY;  Surgeon: Darron Deatrice LABOR, MD;  Location: [facility];  Service: Cardiovascular;  Laterality: Bilateral;   SHOULDER ARTHROSCOPY WITH SUBACROMIAL DECOMPRESSION Left 04/02/2017   Procedure: SHOULDER ARTHROSCOPY WITH DECOMPRESSION, DEBRIDEMENT, SLAP REPAIR,;  Surgeon: Edie Norleen PARAS, MD;  Location: [facility] ORS;  Service: Orthopedics;  Laterality: Left;   SHOULDER SURGERY Bilateral 2011,2012   Social History:  reports that he quit smoking about 17 years ago. His smoking use included cigarettes. He started smoking about 47 years ago. He has a 30 pack-year smoking history. He has been exposed to tobacco smoke. He has never used smokeless tobacco. He reports current alcohol use. He  reports that he does not use drugs.  Allergies[1]  Family History  Problem Relation Age of Onset   Heart attack Father 41   Heart attack Brother 19   Heart attack Brother 29    Prior to Admission medications  Medication Sig Start Date End Date Taking? Authorizing Provider  acetaminophen  (TYLENOL ) 325 MG tablet Take 2 tablets (650 mg total) by mouth every 6 (six) hours as needed for mild  pain (or Fever >/= 101). 02/09/21   Josette Ade, MD  albuterol  (VENTOLIN  HFA) 108 (90 Base) MCG/ACT inhaler Inhale 2 puffs into the lungs every 6 (six) hours as needed for wheezing or shortness of breath. 05/03/24   Furth, Cadence H, PA-C  aspirin  EC 81 MG tablet Take 1 tablet (81 mg total) by mouth daily. Swallow whole. 05/13/22   Dunn, Bernardino HERO, PA-C  atorvastatin  (LIPITOR) 40 MG tablet Take 40 mg by mouth daily at 6 PM.     [provider]  Continuous Glucose Sensor (FREESTYLE LIBRE 3 PLUS SENSOR) MISC every 14 (fourteen) days. 05/05/23   [provider]  folic acid (FOLVITE) 1 MG tablet Take 1 mg by mouth daily.    [provider]  furosemide  (LASIX ) 20 MG tablet Take 1 tablet (20 mg total) by mouth daily as needed. 11/02/23   Gollan, Timothy J, MD  JARDIANCE  25 MG TABS tablet Take 1 tablet (25 mg total) by mouth daily. 10/16/23   Furth, Cadence H, PA-C  methotrexate (RHEUMATREX) 2.5 MG tablet Take 20 mg by mouth once a week.    [provider]  metoprolol  succinate (TOPROL -XL) 25 MG 24 hr tablet Take 1 tablet (25 mg total) by mouth every morning. 11/02/23   Gollan, Timothy J, MD  multivitamin-lutein  (OCUVITE-LUTEIN ) CAPS capsule Take 1 capsule by mouth 2 (two) times daily.    [provider]  NOVOLOG  FLEXPEN 100 UNIT/ML FlexPen Inject 15 Units into the skin 2 (two) times daily before a meal. 01/28/21   [provider]  NOVOLOG  MIX 70/30 FLEXPEN (70-30) 100 UNIT/ML FlexPen Inject into the skin. 06/10/23   [provider]  oxyCODONE  (ROXICODONE ) 15 MG immediate release tablet Take 15 mg by mouth every 6 (six) hours as needed. 02/12/22   [provider]  OZEMPIC, 2 MG/DOSE, 8 MG/3ML SOPN Inject into the skin once a week. 01/04/23   [provider]  potassium chloride  SA (KLOR-CON  M) 20 MEQ tablet TAKE 1 TABLET(20 MEQ) BY MOUTH DAILY 10/14/23   Gerard Frederick, NP  pregabalin  (LYRICA ) 75 MG capsule Take 75 mg by mouth 2 (two)  times daily.  05/18/17   [provider]  Psyllium (METAMUCIL PO) Take by mouth daily.    [provider]  senna (SENOKOT) 8.6 MG TABS tablet Take 2 tablets (17.2 mg total) by mouth at bedtime. 02/09/21   Josette Ade, MD  spironolactone  (ALDACTONE ) 25 MG tablet Take 0.5 tablets (12.5 mg total) by mouth daily. 11/02/23   Gollan, Timothy J, MD  tadalafil  (CIALIS ) 20 MG tablet Take 1 tablet (20 mg total) by mouth daily as needed (take 45 minutes prior to sexual activity). 12/23/22   Francisca Redell BROCKS, MD  tamsulosin  (FLOMAX ) 0.4 MG CAPS capsule Take 1 capsule (0.4 mg total) by mouth daily. 01/02/23   Francisca Redell BROCKS, MD  TRESIBA  FLEXTOUCH 100 UNIT/ML FlexTouch Pen Inject into the skin. 06/18/23   [provider]  UNABLE TO FIND Med Name: Trimix (30/1/50)-(Pap/Phent/PGE)  Test Dose  1ml vial   Qty #3  Refills 0  [facility] [phone] Fax [phone] 01/12/23   Helon Clotilda LABOR, PA-C    Physical Exam: Vitals:   05/04/24 0042 05/04/24 0130 05/04/24 0200 05/04/24 0230  BP: 107/64 (!) 96/56 108/68 116/73  Pulse:  93 (!) 50 94  Resp:   (!) 23 (!) 24  Temp:      SpO2: 94% 96% 95% 92%  Weight:      Height:       Physical Exam Vitals and nursing note reviewed.  Constitutional:      General: He is not in acute distress. HENT:     Head: Normocephalic and atraumatic.  Cardiovascular:     Rate and Rhythm: Regular rhythm. Bradycardia present.     Heart sounds: Normal heart sounds.  Pulmonary:     Effort: Tachypnea present.     Breath sounds: Wheezing present.  Abdominal:     Palpations: Abdomen is soft.     Tenderness: There is no abdominal tenderness.  Neurological:     Mental Status: Mental status is at baseline.     Labs on Admission: I have personally reviewed following labs and imaging studies  CBC: Recent Labs  Lab 05/03/24 1532 05/03/24 1711  WBC 10.1 10.0  HGB 14.2 14.6  HCT 43.7 45.0  MCV 94.8 95.3  PLT 297 295   Basic  Metabolic Panel: Recent Labs  Lab 05/03/24 1532 05/03/24 1711  NA 138 137  K 3.9 3.8  CL 102 103  CO2 21* 19*  GLUCOSE 142* 135*  BUN 23 23  CREATININE 1.25* 1.13  CALCIUM  9.4 9.4   GFR: Estimated Creatinine Clearance: 79.8 mL/min (by C-G formula based on SCr of 1.13 mg/dL). Liver Function Tests: Recent Labs  Lab 05/03/24 1532  AST 28  ALT 19  ALKPHOS 76  BILITOT 0.8  PROT 6.8  ALBUMIN 3.5   No results for input(s): LIPASE, AMYLASE in the last 168 hours. No results for input(s): AMMONIA in the last 168 hours. Coagulation Profile: No results for input(s): INR, PROTIME in the last 168 hours. Cardiac Enzymes: No results for input(s): CKTOTAL, CKMB, CKMBINDEX, TROPONINI in the last 168 hours. BNP (last 3 results) Recent Labs    05/03/24 1532 05/03/24 2342  PROBNP 469.0* 364.0*   HbA1C: No results for input(s): HGBA1C in the last 72 hours. CBG: No results for input(s): GLUCAP in the last 168 hours. Lipid Profile: No results for input(s): CHOL, HDL, LDLCALC, TRIG, CHOLHDL, LDLDIRECT in the last 72 hours. Thyroid Function Tests: Recent Labs    05/03/24 1532  TSH 2.260   Anemia Panel: No results for input(s): VITAMINB12, FOLATE, FERRITIN, TIBC, IRON, RETICCTPCT in the last 72 hours. Urine analysis:    Component Value Date/Time   COLORURINE YELLOW 07/19/2014 1659   APPEARANCEUR CLEAR 07/19/2014 1659   LABSPEC 1.015 07/19/2014 1659   PHURINE 5.5 07/19/2014 1659   GLUCOSEU 500 mg/dL 95/79/7983 8340   HGBUR NEGATIVE 07/19/2014 1659   BILIRUBINUR NEGATIVE 07/19/2014 1659   KETONESUR NEGATIVE 07/19/2014 1659   PROTEINUR NEGATIVE 07/19/2014 1659   NITRITE NEGATIVE 07/19/2014 1659   LEUKOCYTESUR NEGATIVE 07/19/2014 1659    Radiological Exams on Admission: CT Angio Chest PE W and/or Wo Contrast Result Date: 05/04/2024 EXAM: CTA of the Chest with contrast for PE 05/04/2024 01:24:17 AM TECHNIQUE: CTA of the chest was  performed after the administration of 75 mL of iohexol  (OMNIPAQUE ) 350 MG/ML injection. Multiplanar reformatted images are provided for review. MIP images are provided for review. Automated exposure control,  iterative reconstruction, and/or weight based adjustment of the mA/kV was utilized to reduce the radiation dose to as low as reasonably achievable. COMPARISON: PA and lateral chest yesterday, PA and lateral chest 05/13/2022, and a partial chest CT for cardiac SPECT CT 03/13/2022. CLINICAL HISTORY: Pulmonary embolism (PE) suspected, low to intermediate probability, positive D-dimer. The patient was seen by his pulmonologist and had a chest x-ray and blood work at the office, then was told to come to the emergency room. FINDINGS: PULMONARY ARTERIES: Pulmonary arteries are well opacified for evaluation. No pulmonary embolism. The pulmonary arteries are upper normal in caliber. MEDIASTINUM: There is mild cardiomegaly with left chamber predominance. There is no pericardial effusion. There are left main and patchy 3-vessel coronary artery calcifications, greatest in the LAD. The central pulmonary veins are not dilated. There is atherosclerosis of the aorta and great vessels without stenosis, dissection, or aneurysm. Mediastinal lipomatosis appears similar. There is a patulous esophagus consistent with chronic dysmotility, unchanged. Small hiatal fat hernia. LYMPH NODES: There are shotty subcentimeter lymph nodes in the short axis tracheal chain nodes, nonpathologic by size criteria. There are mildly prominent bilateral hilar lymph nodes up to 1.2 cm, which appear to have been present previously. No other thoracic adenopathy is seen. The axillae are clear. LUNGS AND PLEURA: There are bilateral new minimal pleural effusions. Diffuse bronchial thickening in the lungs with occasional subsegmental small branch bronchial impactions in the basal lower lobes. There is posterior atelectasis in both lungs. No subpleural septal  edema is seen, but there is generalized ground-glass haziness in the upper lobes. This could be due to chronic interstitial lung disease, air trapping or possibly ground-glass edema. Underlying infection is difficult to exclude in the appropriate clinical setting. There are paraseptal and centrilobular emphysematous changes in both upper lobes, seen previously. In the left upper lobe there are 2 nodules, each measuring 6 mm, with one on series 5 axial image 40 and the other on axial 46. Unknown if these were present in 2023 due to lung windows not having been done with the soft tissue windows of that study. No pneumothorax. UPPER ABDOMEN: There is a 1.3 cm left adrenal myelolipoma, Hounsfield density is -44. No acute upper abdominal findings. SOFT TISSUES AND BONES: There is extensive bridging enthesopathy of the thoracic spine, no acute or other significant osseous findings. Asymmetrically advanced left shoulder degenerative joint disease (DJD). No acute soft tissue abnormality. IMPRESSION: 1. No evidence of pulmonary embolism. Pulmonary arteries are at the upper limit of normal caliber. 2. Generalized ground-glass haziness in the upper lobes, which may reflect air trapping, edema, infection, or chronic interstitial lung disease. 3. Diffuse bronchial wall thickening with occasional subsegmental bronchial impactions and mild dependent atelectasis. 4. Bilateral new minimal pleural effusions. 5. Two left upper lobe solid pulmonary nodules measuring 6 mm each; recommend non-contrast chest CT at 3-6 months and again at 18-24 months, as per Fleischner Society Guidelines. 6. Pulmonary emphysema is an independent risk factor for lung cancer. Recommend consideration for evaluation for a low-dose CT lung cancer screening program. 7. Cardiomegaly and 3-vessel coronary artery disease. 8. Small left adrenal myelolipoma. Electronically signed by: Francis Quam MD 05/04/2024 02:04 AM EST RP Workstation: [id number]   DG Chest 2  View Result Date: 05/03/2024 CLINICAL DATA:  Shortness of breath. EXAM: CHEST - 2 VIEW COMPARISON:  May 13, 2022 FINDINGS: The heart size and mediastinal contours are within normal limits. There is pulmonary edema. Patchy opacity of left lung base is identified, underlying pneumonia is not  excluded. There is no pleural effusion. The visualized skeletal structures are stable. IMPRESSION: Pulmonary edema. Patchy consolidation of left lung base, developing pneumonia is not excluded. Electronically Signed   By: Craig Farr M.D.   On: 05/03/2024 15:59   Data Reviewed for HPI: Relevant notes from primary care and specialist visits, past discharge summaries as available in EHR, including Care Everywhere. Prior diagnostic testing as pertinent to current admission diagnoses Updated medications and problem lists for reconciliation ED course, including vitals, labs, imaging, treatment and response to treatment Triage notes, nursing and pharmacy notes and ED provider's notes Notable results as noted above in HPI      Assessment and Plan: * Acute on chronic HFrEF (heart failure with reduced ejection fraction) (HCC) Last EF 35 to 40% Presents with exertional dyspnea, proBNP 364, minimal pleural effusion on CTA chest with possible airspace disease upper lobes IV Lasix  Continue metoprolol , spironolactone  Holding Jardiance  due to mild anion gap metabolic acidosis pending UA Entresto  held earlier in the day by cardiology due to soft blood pressures Will update echocardiogram Daily weights with intake and output monitoring  Chest pain History of nonobstructive CAD on [facility] 2024 PAD with prior amputation Troponin with non-ACS trend 22-17 Continue aspirin  and Lipitor  High anion gap metabolic acidosis Euglycemic DKA not suspected Blood glucose 135 with anion gap 16 bicarb 19 Will get a urinalysis to evaluate for glucosuria We will get beta hydroxybutyric acid Will hold Jardiance    Sleep  apnea Does not use CPAP  Type 2 diabetes mellitus with hyperlipidemia (HCC) Continue basal insulin  Sliding scale insulin  coverage    DVT prophylaxis: Lovenox   Consults: chmg  Advance Care Planning:   Code Status: Prior   Family Communication: Wife at bedside  Disposition Plan: Back to previous home environment  Severity of Illness: The appropriate patient status for this patient is OBSERVATION. Observation status is judged to be reasonable and necessary in order to provide the required intensity of service to ensure the patient's safety. The patient's presenting symptoms, physical exam findings, and initial radiographic and laboratory data in the context of their medical condition is felt to place them at decreased risk for further clinical deterioration. Furthermore, it is anticipated that the patient will be medically stable for discharge from the hospital within 2 midnights of admission.   Author: Delayne LULLA Solian, MD 05/04/2024 2:52 AM  For on call review [email or web address].      [1] No Known Allergies  "

## 2024-05-04 NOTE — Telephone Encounter (Signed)
 Heart Failure Stewardship Pharmacy Note  PCP: Honore Sherryle RAMAN, MD PCP-Cardiologist: Evalene Lunger, MD  HPI: Christopher Escobar is a 69 y.o. male with Nonobstructive CAD, PAD with prior amputations, HTN, DM, OSA not on CPAP, CHF who presented with dyspnea on exertion and chest discomfort. On admission, proBNP was 469, HS-troponin was 22. Chest x-ray noted pulmonary edema.    Pertinent cardiac history: Stress test in 02/2022 noted no ischemia, but LVEF of 27%. TTE 03/2022 showed LVEF of 45-50% with G2DD. LHC in 05/2022 noted mild non-obstructive CAD. LVEF at that time was 35-40%. RHC showed CI of 2.43, RA of 4, PW of 17. TTE 11/2022 noted LVEF of 35-40%. TTE this admission pending.  Pertinent Lab Values: Creatinine, Ser  Date Value Ref Range Status  05/03/2024 1.13 0.61 - 1.24 mg/dL Final   BUN  Date Value Ref Range Status  05/03/2024 23 8 - 23 mg/dL Final   Potassium  Date Value Ref Range Status  05/03/2024 3.8 3.5 - 5.1 mmol/L Final   Sodium  Date Value Ref Range Status  05/03/2024 137 135 - 145 mmol/L Final   B Natriuretic Peptide  Date Value Ref Range Status  05/13/2022 855.5 (H) 0.0 - 100.0 pg/mL Final    Comment:    Performed at [facility], [address], [place], [place] [address]   Magnesium   Date Value Ref Range Status  09/15/2019 1.8 1.7 - 2.4 mg/dL Final    Comment:    Performed at [facility], [address] [place], [place] [address]   Hgb A1c MFr Bld  Date Value Ref Range Status  02/07/2021 9.1 (H) 4.8 - 5.6 % Final    Comment:    (NOTE) Pre diabetes:          5.7%-6.4%  Diabetes:              >6.4%  Glycemic control for   <7.0% adults with diabetes    TSH  Date Value Ref Range Status  05/03/2024 2.260 0.350 - 4.500 uIU/mL Final    Comment:    Performed at [facility], [address] [place], [place] [address]   Current Heart Failure Medications:  Loop diuretic: none Beta-Blocker: metoprolol  succinate 25 mg  daily ACEI/ARB/ARNI: none MRA: spironolactone  12.5 mg daily SGLT2i: none Other: none  Prior to admission Heart Failure Medications:  Loop diuretic: furosemide  20 mg prn Beta-Blocker: metoprolol  succinate 25 mg daily ACEI/ARB/ARNI: Entresto  held for hypotension MRA: spironolactone  12.5 mg daily SGLT2i: Jardiance  25 mg daily Other: none  Assessment: 1. Acute on chronic systolic heart failure (LVEF in 2024 was 35-40%)  , due to NICM. NYHA class III symptoms.  -Symptoms: Reports feeling improved since admission. Appetite is fair, dyspnea is improving, denies orthopnea. -Volume: Appears hypervolemic on exam. Creatinine and BUN are stable. Symptoms are improving slowly. Not currently on furosemide . Would consider resuming furosemide  40 mg IV BID. -Hemodynamics: BP is variable, but typically lower end of normal. -BB: Continue metoprolol  succinate 25 mg daily. Would not increase at this time due to hypervolemia and soft BP. -ACEI/ARB/ARNI: Agree with holding Entresto  at this time. Can cosnider resuming prior to discharge. -MRA: Continue spironolactone  12.5 mg daily. -SGLT2i: SGLT2i on hold with anion gap. Ketones were very slightly eleavted, not meeting definition of DKA. Would resume when stable closer to discharge.  Plan: 1) Medication changes recommended at this time: -Consider re-dosing furosemide  40 mg IV BID.  2) Patient assistance: -Pending  3) Education: - Patient has  been educated on current HF medications and potential additions to HF medication regimen - Patient verbalizes understanding that over the next few months, these medication doses may change and more medications may be added to optimize HF regimen - Patient has been educated on basic disease state pathophysiology and goals of therapy  Please do not hesitate to reach out with questions or concerns,  Katanya Schlie, PharmD, CPP, BCPS, [facility] Heart Failure Pharmacist  Phone - [phone] 05/04/2024 10:42 AM

## 2024-05-05 ENCOUNTER — Telehealth: Payer: Self-pay | Admitting: Pharmacist

## 2024-05-05 ENCOUNTER — Observation Stay: Admit: 2024-05-05 | Discharge: 2024-05-05 | Disposition: A | Attending: Physician Assistant

## 2024-05-05 DIAGNOSIS — I5023 Acute on chronic systolic (congestive) heart failure: Secondary | ICD-10-CM

## 2024-05-05 LAB — CBC WITH DIFFERENTIAL/PLATELET
Abs Immature Granulocytes: 0.05 10*3/uL (ref 0.00–0.07)
Basophils Absolute: 0 10*3/uL (ref 0.0–0.1)
Basophils Relative: 0 %
Eosinophils Absolute: 0 10*3/uL (ref 0.0–0.5)
Eosinophils Relative: 0 %
HCT: 43.6 % (ref 39.0–52.0)
Hemoglobin: 14.4 g/dL (ref 13.0–17.0)
Immature Granulocytes: 1 %
Lymphocytes Relative: 13 %
Lymphs Abs: 1.1 10*3/uL (ref 0.7–4.0)
MCH: 31 pg (ref 26.0–34.0)
MCHC: 33 g/dL (ref 30.0–36.0)
MCV: 94 fL (ref 80.0–100.0)
Monocytes Absolute: 0.5 10*3/uL (ref 0.1–1.0)
Monocytes Relative: 6 %
Neutro Abs: 6.6 10*3/uL (ref 1.7–7.7)
Neutrophils Relative %: 80 %
Platelets: 299 10*3/uL (ref 150–400)
RBC: 4.64 MIL/uL (ref 4.22–5.81)
RDW: 15.1 % (ref 11.5–15.5)
WBC: 8.3 10*3/uL (ref 4.0–10.5)
nRBC: 0 % (ref 0.0–0.2)

## 2024-05-05 LAB — MAGNESIUM: Magnesium: 2.4 mg/dL (ref 1.7–2.4)

## 2024-05-05 LAB — BASIC METABOLIC PANEL WITH GFR
Anion gap: 14 (ref 5–15)
BUN: 26 mg/dL — ABNORMAL HIGH (ref 8–23)
CO2: 20 mmol/L — ABNORMAL LOW (ref 22–32)
Calcium: 8.5 mg/dL — ABNORMAL LOW (ref 8.9–10.3)
Chloride: 103 mmol/L (ref 98–111)
Creatinine, Ser: 1.06 mg/dL (ref 0.61–1.24)
GFR, Estimated: >60 mL/min
Glucose, Bld: 160 mg/dL — ABNORMAL HIGH (ref 70–99)
Potassium: 4.1 mmol/L (ref 3.5–5.1)
Sodium: 137 mmol/L (ref 135–145)

## 2024-05-05 LAB — GLUCOSE, CAPILLARY
Glucose-Capillary: 177 mg/dL — ABNORMAL HIGH (ref 70–99)
Glucose-Capillary: 225 mg/dL — ABNORMAL HIGH (ref 70–99)
Glucose-Capillary: 232 mg/dL — ABNORMAL HIGH (ref 70–99)
Glucose-Capillary: 256 mg/dL — ABNORMAL HIGH (ref 70–99)

## 2024-05-05 LAB — ECHOCARDIOGRAM COMPLETE
Height: 71 in
S' Lateral: 3.32 cm
Weight: 4197.56 [oz_av]

## 2024-05-05 LAB — PRO BRAIN NATRIURETIC PEPTIDE: Pro Brain Natriuretic Peptide: 306 pg/mL — ABNORMAL HIGH

## 2024-05-05 LAB — PROCALCITONIN: Procalcitonin: 0.12 ng/mL

## 2024-05-05 MED ORDER — FUROSEMIDE 10 MG/ML IJ SOLN
40.0000 mg | Freq: Once | INTRAMUSCULAR | Status: AC
  Administered 2024-05-05: 40 mg via INTRAVENOUS
  Filled 2024-05-05: qty 4

## 2024-05-05 MED ORDER — EMPAGLIFLOZIN 25 MG PO TABS
25.0000 mg | ORAL_TABLET | Freq: Every day | ORAL | Status: DC
  Administered 2024-05-05 – 2024-05-06 (×2): 25 mg via ORAL
  Filled 2024-05-05 (×2): qty 1

## 2024-05-05 MED ORDER — FUROSEMIDE 10 MG/ML IJ SOLN
40.0000 mg | Freq: Once | INTRAMUSCULAR | Status: AC
  Administered 2024-05-05: 40 mg via INTRAVENOUS

## 2024-05-05 NOTE — Progress Notes (Signed)
 SPIRITUAL CARE AND COUNSELING CONSULT NOTE   VISIT SUMMARY  Chaplain met with patient and spouse to offer support and prayer as requested. Family seemed to value and appreciate the visit and patient was looking forward to getting home.   SPIRITUAL ENCOUNTER                                                                                                                                                                      Type of Visit: Initial Care provided to:: Patient, Family Referral source: Patient request, Family Reason for visit: Routine spiritual support   SPIRITUAL FRAMEWORK  Presenting Themes: Meaning/purpose/sources of inspiration, Coping tools, Goals in life/care, Courage hope and growth Community/Connection: Family, Friend(s), Faith community Patient Stress Factors: Health changes Family Stress Factors: None identified   GOALS   Self/Personal Goals: Patient and caregiver committed to change in diet and exercise   INTERVENTIONS   Spiritual Care Interventions Made: Established relationship of care and support, Reflective listening, Normalization of emotions, Prayer    INTERVENTION OUTCOMES   Outcomes: Reduced anxiety, Autonomy/agency, Awareness around self/spiritual resourses, Connected to spiritual community  [facility] CARE PLAN        If immediate needs arise,    Earma Nicolaou  05/05/2024 7:47 PM

## 2024-05-05 NOTE — Plan of Care (Signed)
 VSS. RA. Echo done this shift. See results. Family at bedside this shift. IV lasix  continued. Bps on the softer side.  Problem: Education: Goal: Ability to describe self-care measures that may prevent or decrease complications (Diabetes Survival Skills Education) will improve 05/05/2024 1357 by Tia Glean PARAS, RN Outcome: Progressing 05/05/2024 1357 by Tia Glean PARAS, RN Outcome: Progressing Goal: Individualized Educational Video(s) 05/05/2024 1357 by Tia Glean PARAS, RN Outcome: Progressing 05/05/2024 1357 by Tia Glean PARAS, RN Outcome: Progressing   Problem: Coping: Goal: Ability to adjust to condition or change in health will improve 05/05/2024 1357 by Tia Glean PARAS, RN Outcome: Progressing 05/05/2024 1357 by Tia Glean PARAS, RN Outcome: Progressing   Problem: Fluid Volume: Goal: Ability to maintain a balanced intake and output will improve 05/05/2024 1357 by Tia Glean PARAS, RN Outcome: Progressing 05/05/2024 1357 by Tia Glean PARAS, RN Outcome: Progressing   Problem: Health Behavior/Discharge Planning: Goal: Ability to identify and utilize available resources and services will improve 05/05/2024 1357 by Tia Glean PARAS, RN Outcome: Progressing 05/05/2024 1357 by Tia Glean PARAS, RN Outcome: Progressing Goal: Ability to manage health-related needs will improve 05/05/2024 1357 by Tia Glean PARAS, RN Outcome: Progressing 05/05/2024 1357 by Tia Glean PARAS, RN Outcome: Progressing   Problem: Metabolic: Goal: Ability to maintain appropriate glucose levels will improve 05/05/2024 1357 by Tia Glean PARAS, RN Outcome: Progressing 05/05/2024 1357 by Tia Glean PARAS, RN Outcome: Progressing   Problem: Nutritional: Goal: Maintenance of adequate nutrition will improve 05/05/2024 1357 by Tia Glean PARAS, RN Outcome: Progressing 05/05/2024 1357 by Tia Glean PARAS, RN Outcome: Progressing Goal: Progress toward achieving an optimal weight  will improve 05/05/2024 1357 by Tia Glean PARAS, RN Outcome: Progressing 05/05/2024 1357 by Tia Glean PARAS, RN Outcome: Progressing   Problem: Skin Integrity: Goal: Risk for impaired skin integrity will decrease 05/05/2024 1357 by Tia Glean PARAS, RN Outcome: Progressing 05/05/2024 1357 by Tia Glean PARAS, RN Outcome: Progressing   Problem: Tissue Perfusion: Goal: Adequacy of tissue perfusion will improve 05/05/2024 1357 by Tia Glean PARAS, RN Outcome: Progressing 05/05/2024 1357 by Tia Glean PARAS, RN Outcome: Progressing   Problem: Education: Goal: Knowledge of General Education information will improve Description: Including pain rating scale, medication(s)/side effects and non-pharmacologic comfort measures 05/05/2024 1357 by Tia Glean PARAS, RN Outcome: Progressing 05/05/2024 1357 by Tia Glean PARAS, RN Outcome: Progressing   Problem: Health Behavior/Discharge Planning: Goal: Ability to manage health-related needs will improve 05/05/2024 1357 by Tia Glean PARAS, RN Outcome: Progressing 05/05/2024 1357 by Tia Glean PARAS, RN Outcome: Progressing   Problem: Clinical Measurements: Goal: Ability to maintain clinical measurements within normal limits will improve 05/05/2024 1357 by Tia Glean PARAS, RN Outcome: Progressing 05/05/2024 1357 by Tia Glean PARAS, RN Outcome: Progressing Goal: Will remain free from infection Outcome: Progressing Goal: Diagnostic test results will improve Outcome: Progressing Goal: Respiratory complications will improve Outcome: Progressing Goal: Cardiovascular complication will be avoided Outcome: Progressing   Problem: Activity: Goal: Risk for activity intolerance will decrease Outcome: Progressing   Problem: Nutrition: Goal: Adequate nutrition will be maintained Outcome: Progressing   Problem: Coping: Goal: Level of anxiety will decrease Outcome: Progressing   Problem: Elimination: Goal: Will not  experience complications related to bowel motility Outcome: Progressing Goal: Will not experience complications related to urinary retention Outcome: Progressing   Problem: Pain Managment: Goal: General experience of comfort will improve and/or be controlled Outcome: Progressing   Problem: Safety: Goal: Ability to remain free from injury will improve Outcome: Progressing   Problem: Skin Integrity:  Goal: Risk for impaired skin integrity will decrease Outcome: Progressing

## 2024-05-05 NOTE — Telephone Encounter (Signed)
 Heart Failure Stewardship Pharmacy Note  PCP: Honore Sherryle RAMAN, MD PCP-Cardiologist: Evalene Lunger, MD  HPI: Christopher Escobar is a 69 y.o. male with Nonobstructive CAD, PAD with prior amputations, HTN, DM, OSA not on CPAP, CHF who presented with dyspnea on exertion and chest discomfort. On admission, proBNP was 469, HS-troponin was 22. Chest x-ray noted pulmonary edema.    Pertinent cardiac history: Stress test in 02/2022 noted no ischemia, but LVEF of 27%. TTE 03/2022 showed LVEF of 45-50% with G2DD. LHC in 05/2022 noted mild non-obstructive CAD. LVEF at that time was 35-40%. RHC showed CI of 2.43, RA of 4, PW of 17. TTE 11/2022 noted LVEF of 35-40%. TTE this admission pending.  Pertinent Lab Values: Creatinine, Ser  Date Value Ref Range Status  05/05/2024 1.06 0.61 - 1.24 mg/dL Final   BUN  Date Value Ref Range Status  05/05/2024 26 (H) 8 - 23 mg/dL Final   Potassium  Date Value Ref Range Status  05/05/2024 4.1 3.5 - 5.1 mmol/L Final   Sodium  Date Value Ref Range Status  05/05/2024 137 135 - 145 mmol/L Final   B Natriuretic Peptide  Date Value Ref Range Status  05/13/2022 855.5 (H) 0.0 - 100.0 pg/mL Final    Comment:    Performed at [facility], [address] [place], [place] [address]   Magnesium   Date Value Ref Range Status  05/05/2024 2.4 1.7 - 2.4 mg/dL Final    Comment:    Performed at [facility] [address] [place], [place] [address]   Hgb A1c MFr Bld  Date Value Ref Range Status  05/04/2024 6.8 (H) 4.8 - 5.6 % Final    Comment:    (NOTE) Diagnosis of Diabetes The following HbA1c ranges recommended by the American Diabetes Association (ADA) may be used as an aid in the diagnosis of diabetes mellitus.  Hemoglobin             Suggested A1C NGSP%              Diagnosis  <5.7                   Non Diabetic  5.7-6.4                Pre-Diabetic  >6.4                   Diabetic  <7.0                   Glycemic control  for                       adults with diabetes.     TSH  Date Value Ref Range Status  05/03/2024 2.260 0.350 - 4.500 uIU/mL Final    Comment:    Performed at [facility], [address] [place], [place] [address]   Current Heart Failure Medications:  Loop diuretic: none Beta-Blocker: metoprolol  succinate 25 mg daily ACEI/ARB/ARNI: none MRA: none SGLT2i: none Other: none  Prior to admission Heart Failure Medications:  Loop diuretic: furosemide  20 mg prn Beta-Blocker: metoprolol  succinate 25 mg daily ACEI/ARB/ARNI: Entresto  held for hypotension MRA: spironolactone  12.5 mg daily SGLT2i: Jardiance  25 mg daily Other: none  Assessment: 1. Acute on chronic systolic heart failure (LVEF in 2024 was 35-40%)  , due to NICM. NYHA class III symptoms.  -Symptoms: Reports feeling improved since admission. Appetite is fair, dyspnea is improving, denies orthopnea.  Thought that COPD may be contributing to symptoms. -Volume: Does appear to be mildly hypervolemic on exam. Creatinine stable. Symptoms are improving slowly. Not currently on furosemide .   -Hemodynamics: BP is low. . -BB: Continue metoprolol  succinate 25 mg daily. Would not increase at this time due to hypervolemia and soft BP. -ACEI/ARB/ARNI: Agree with holding Entresto  at this time. Can cosnider resuming prior to discharge. -MRA: Continue spironolactone  12.5 mg daily. -SGLT2i: SGLT2i held on admission. Ketones were very slightly eleavted, not meeting definition of DKA. Would resume tomorrow given lower impact on BP.  Plan: 1) Medication changes recommended at this time: -Consider re-dosing furosemide  40 mg IV.  2) Patient assistance: -Pending  3) Education: - Patient has been educated on current HF medications and potential additions to HF medication regimen - Patient verbalizes understanding that over the next few months, these medication doses may change and more medications may be added to optimize HF regimen -  Patient has been educated on basic disease state pathophysiology and goals of therapy  Please do not hesitate to reach out with questions or concerns,  Samanda Buske, PharmD, CPP, BCPS, [facility] Heart Failure Pharmacist  Phone - [phone] 05/05/2024 8:17 AM

## 2024-05-05 NOTE — Progress Notes (Signed)
 " Progress Note   Patient: Christopher Escobar FMW:[id number] DOB: July 26, 1955 DOA: 05/03/2024     0 DOS: the patient was seen and examined on 05/05/2024   Brief hospital course: Per H&P HPI    Christopher Escobar is a 69 y.o. male with medical history significant for Nonobstructive CAD, PAD with prior amputations, HTN, DM, OSA not on CPAP,, HFrEF(EF 35-40%), seen earlier in the day by his cardiologist with a week of dyspnea on exertion associated with chest discomfort, being admitted with CHF exacerbation associated with chest pain, likely nonanginal.  He presents with persistent complaint voiced earlier to cardiology of shortness of breath and chest discomfort.  At his appointment earlier in the day on 05/03/2024 his Lasix  dose was doubled for 3 days and he was continued on his other GDMT except for Entresto  which was stopped due to soft blood pressure of 92/60.  He also complained of palpitations, EKG showed PVCs so he 1 week Holter was ordered as well as an echo.  He was given a 1 month follow-up. In the ED mildly tachypneic intermittently hypotensive to 96/56, with O2 sat in the mid 90s on room air. Troponin 22-17 with proBNP 364. BMP with glucose 135, anion gap 16 and bicarb 19 CBC WNL EKG showed undetermined rhythm at 98 with RBBB and PVCs CTA chest PE protocol negative for PE but showed minimal pleural effusion, generalized ground glass haziness upper lobes with varying differentials--please see report   Patient treated with chewable aspirin  324 mg given a dose of Lasix  40 mg as well as nitroglycerin  sublingual   Admission requested   Assessment and Plan: * Acute on chronic HFrEF (heart failure with reduced ejection fraction) (HCC) Presents with exertional dyspnea, proBNP 364, minimal pleural effusion on CTA chest with possible airspace disease upper lobes TTE 11/2022 EF 35 to 40% TTE this hospitalization LVEF 40% Cardiology consulted --Continue IV Lasix , continue metoprolol   --Hold entresto  and  spironolactone  due to low normal blood pressure -Continue IV lasix    Possible COPD exacerbation Continue steroids for 5 d course    Chest pain Resolved  History of nonobstructive CAD on [facility] 2024 PAD with prior amputation EKG without evidence of acute ischemaTroponin with non-ACS trend 22-17 Continue aspirin  and Lipitor  High anion gap metabolic acidosis Mild AG 14, Bicarb 20 Will repeat BMP in the AM  PVCs No palpitations this AM.  Noted on tele.  -Continue metoprolol    Sleep apnea Does not use CPAP  Type 2 diabetes mellitus with hyperlipidemia (HCC) A1c 6.8 controlled continue basal insulin  Sliding scale insulin  coverage   Class 2 Obesity  BMI 36 Complicates care.      Subjective: Patient feels breathing is much improved with IV diuresis.  He denies any chest pain  Physical Exam: Vitals:   05/05/24 0746 05/05/24 0829 05/05/24 1236 05/05/24 1538  BP: 96/66 (!) 107/59 (!) 110/58 (!) 99/38  Pulse: 92 92 89 94  Resp: 20 20 18 18   Temp: (!) 97.4 F (36.3 C) 97.6 F (36.4 C) (!) 97.4 F (36.3 C) 97.9 F (36.6 C)  TempSrc:  Oral    SpO2: 93% 95% 96% 95%  Weight:      Height:       Physical Exam  Constitutional: In no distress.  Cardiovascular: Normal rate, regular rhythm. No lower extremity edema  Pulmonary: Non labored breathing on room air, no wheezing or rales.   Abdominal: Soft. Non distended and non tender Musculoskeletal: Normal range of motion.  Neurological: Alert and oriented to person, place, and time. Non focal  Skin: Skin is warm and dry.    Data Reviewed:     Latest Ref Rng & Units 05/05/2024    4:55 AM 05/04/2024    8:09 AM 05/03/2024    5:11 PM  BMP  Glucose 70 - 99 mg/dL 839  837  864   BUN 8 - 23 mg/dL 26  23  23    Creatinine 0.61 - 1.24 mg/dL 8.93  8.90  8.86   Sodium 135 - 145 mmol/L 137  137  137   Potassium 3.5 - 5.1 mmol/L 4.1  4.1  3.8   Chloride 98 - 111 mmol/L 103  102  103   CO2 22 - 32 mmol/L 20  22  19    Calcium  8.9 -  10.3 mg/dL 8.5  9.0  9.4       Latest Ref Rng & Units 05/05/2024    4:55 AM 05/03/2024    5:11 PM 05/03/2024    3:32 PM  CBC  WBC 4.0 - 10.5 K/uL 8.3  10.0  10.1   Hemoglobin 13.0 - 17.0 g/dL 85.5  85.3  85.7   Hematocrit 39.0 - 52.0 % 43.6  45.0  43.7   Platelets 150 - 400 K/uL 299  295  297      Family Communication: Spouse   Disposition: Status is: Observation The patient remains OBS appropriate and will d/c before 2 midnights.  Planned Discharge Destination: Home    Time spent: 35 minutes  Author: Alban Pepper, MD 05/05/2024 5:20 PM  For on call review [email or web address].  "

## 2024-05-05 NOTE — Progress Notes (Signed)
*  PRELIMINARY RESULTS* Echocardiogram 2D Echocardiogram has been performed.  Floydene Harder 05/05/2024, 8:06 AM

## 2024-05-05 NOTE — Progress Notes (Signed)
 "  Rounding Note   Patient Name: Christopher Escobar Date of Encounter: 05/05/2024  [facility] Cardiologist: Evalene Lunger, MD   Subjective - Reports breathing is improving, still not at his baseline - On ambulation noted some shortness of breath - Leg swelling resolved, abdomen less distended - Reports he has been taking Lasix  20 mg daily at home  Scheduled Meds:  aspirin  EC  81 mg Oral Daily   atorvastatin   40 mg Oral q1800   dextromethorphan -guaiFENesin   1 tablet Oral BID   empagliflozin   25 mg Oral Daily   enoxaparin  (LOVENOX ) injection  60 mg Subcutaneous Q24H   insulin  aspart  0-15 Units Subcutaneous TID WC   insulin  aspart  0-5 Units Subcutaneous QHS   insulin  glargine-yfgn  8 Units Subcutaneous QHS   metoprolol  succinate  25 mg Oral Q breakfast   predniSONE   40 mg Oral Q breakfast   tamsulosin   0.4 mg Oral Daily   Continuous Infusions:  PRN Meds: acetaminophen  **OR** acetaminophen , HYDROcodone -acetaminophen , ipratropium-albuterol , nitroGLYCERIN , ondansetron  **OR** ondansetron  (ZOFRAN ) IV   Vital Signs  Vitals:   05/05/24 0746 05/05/24 0829 05/05/24 1236 05/05/24 1538  BP: 96/66 (!) 107/59 (!) 110/58 (!) 99/38  Pulse: 92 92 89 94  Resp: 20 20 18 18   Temp: (!) 97.4 F (36.3 C) 97.6 F (36.4 C) (!) 97.4 F (36.3 C) 97.9 F (36.6 C)  TempSrc:  Oral    SpO2: 93% 95% 96% 95%  Weight:      Height:        Intake/Output Summary (Last 24 hours) at 05/05/2024 1553 Last data filed at 05/05/2024 1024 Gross per 24 hour  Intake 520 ml  Output --  Net 520 ml      05/03/2024    5:09 PM 05/03/2024    2:01 PM 11/02/2023    4:13 PM  Last 3 Weights  Weight (lbs) 262 lb 5.6 oz 262 lb 6.4 oz 261 lb  Weight (kg) 119 kg 119.024 kg 118.389 kg      Telemetry Normal sinus rhythm with PVCs- Personally Reviewed  ECG   - Personally Reviewed  Physical Exam  GEN: No acute distress.   Neck: No JVD Cardiac: RRR, no murmurs, rubs, or gallops.  Respiratory: Clear to  auscultation bilaterally. GI: Soft, nontender, non-distended  MS: No edema; No deformity. Neuro:  Nonfocal  Psych: Normal affect   Labs High Sensitivity Troponin:  No results for input(s): TROPONINIHS in the last 720 hours.  Recent Labs  Lab 05/03/24 1711 05/03/24 2342  TRNPT 22* 17       Chemistry Recent Labs  Lab 05/03/24 1532 05/03/24 1711 05/04/24 0809 05/05/24 0455  NA 138 137 137 137  K 3.9 3.8 4.1 4.1  CL 102 103 102 103  CO2 21* 19* 22 20*  GLUCOSE 142* 135* 162* 160*  BUN 23 23 23  26*  CREATININE 1.25* 1.13 1.09 1.06  CALCIUM  9.4 9.4 9.0 8.5*  MG  --   --  2.2 2.4  PROT 6.8  --   --   --   ALBUMIN 3.5  --   --   --   AST 28  --   --   --   ALT 19  --   --   --   ALKPHOS 76  --   --   --   BILITOT 0.8  --   --   --   GFRNONAA >60 >60 >60 >60  ANIONGAP 14 16* 13 14  Lipids No results for input(s): CHOL, TRIG, HDL, LABVLDL, LDLCALC, CHOLHDL in the last 168 hours.  Hematology Recent Labs  Lab 05/03/24 1532 05/03/24 1711 05/05/24 0455  WBC 10.1 10.0 8.3  RBC 4.61 4.72 4.64  HGB 14.2 14.6 14.4  HCT 43.7 45.0 43.6  MCV 94.8 95.3 94.0  MCH 30.8 30.9 31.0  MCHC 32.5 32.4 33.0  RDW 15.4 15.2 15.1  PLT 297 295 299   Thyroid  Recent Labs  Lab 05/03/24 1532  TSH 2.260    BNP Recent Labs  Lab 05/03/24 1532 05/03/24 2342 05/05/24 0455  PROBNP 469.0* 364.0* 306.0*    DDimer  Recent Labs  Lab 05/03/24 1532  DDIMER 1.02*     Radiology  ECHOCARDIOGRAM COMPLETE Result Date: 05/05/2024    ECHOCARDIOGRAM REPORT   Patient Name:   Christopher Escobar Date of Exam: 05/05/2024 Medical Rec #:  [id number]        Height:       71.0 in Accession #:    [id number]       Weight:       262.3 lb Date of Birth:  1955-07-28        BSA:          2.366 m Patient Age:    68 years         BP:           94/57 mmHg Patient Gender: M                HR:           84 bpm. Exam Location:  [facility] Procedure: 2D Echo, Cardiac Doppler and Color Doppler (Both Spectral and  Color            Flow Doppler were utilized during procedure). Indications:     Congestive heart failure I50.9  History:         Patient has prior history of Echocardiogram examinations, most                  recent 12/10/2022. CAD; Risk Factors:Diabetes.  Sonographer:     Christopher Furnace Referring Phys:  [id number] [facility] CAREY Diagnosing Phys: Evalene Lunger MD  Sonographer Comments: Technically challenging study due to limited acoustic windows, no apical window and suboptimal subcostal window. IMPRESSIONS  1. Left ventricular ejection fraction, by estimation, is 40 to 45%. The left ventricle has mildly decreased function. The left ventricle has no regional wall motion abnormalities. There is moderate left ventricular hypertrophy. Left ventricular diastolic parameters are indeterminate.  2. Right ventricular systolic function is normal. The right ventricular size is normal.  3. The mitral valve is normal in structure. No evidence of mitral valve regurgitation. No evidence of mitral stenosis.  4. The aortic valve is normal in structure. Aortic valve regurgitation is not visualized. No aortic stenosis is present.  5. The inferior vena cava is normal in size with greater than 50% respiratory variability, suggesting right atrial pressure of 3 mmHg. FINDINGS  Left Ventricle: Left ventricular ejection fraction, by estimation, is 40 to 45%. The left ventricle has mildly decreased function. The left ventricle has no regional wall motion abnormalities. Strain was performed and the global longitudinal strain is indeterminate. The left ventricular internal cavity size was normal in size. There is moderate left ventricular hypertrophy. Left ventricular diastolic parameters are indeterminate. Right Ventricle: The right ventricular size is normal. No increase in right ventricular wall thickness. Right ventricular systolic function is normal. Left Atrium: Left atrial size  was normal in size. Right Atrium: Right atrial size was normal  in size. Pericardium: There is no evidence of pericardial effusion. Mitral Valve: The mitral valve is normal in structure. No evidence of mitral valve regurgitation. No evidence of mitral valve stenosis. Tricuspid Valve: The tricuspid valve is normal in structure. Tricuspid valve regurgitation is not demonstrated. No evidence of tricuspid stenosis. Aortic Valve: The aortic valve is normal in structure. Aortic valve regurgitation is not visualized. No aortic stenosis is present. Pulmonic Valve: The pulmonic valve was normal in structure. Pulmonic valve regurgitation is not visualized. No evidence of pulmonic stenosis. Aorta: The aortic root is normal in size and structure. Venous: The inferior vena cava is normal in size with greater than 50% respiratory variability, suggesting right atrial pressure of 3 mmHg. IAS/Shunts: No atrial level shunt detected by color flow Doppler. Additional Comments: 3D was performed not requiring image post processing on an independent workstation and was indeterminate.  LEFT VENTRICLE PLAX 2D LVIDd:         4.64 cm LVIDs:         3.32 cm LV PW:         1.55 cm LV IVS:        1.94 cm LVOT diam:     2.20 cm LVOT Area:     3.80 cm  LEFT ATRIUM         Index LA diam:    3.10 cm 1.31 cm/m   AORTA Ao Root diam: 2.70 cm  SHUNTS Systemic Diam: 2.20 cm Evalene Lunger MD Electronically signed by Evalene Lunger MD Signature Date/Time: 05/05/2024/12:32:21 PM    Final    CT Angio Chest PE W and/or Wo Contrast Result Date: 05/04/2024 EXAM: CTA of the Chest with contrast for PE 05/04/2024 01:24:17 AM TECHNIQUE: CTA of the chest was performed after the administration of 75 mL of iohexol  (OMNIPAQUE ) 350 MG/ML injection. Multiplanar reformatted images are provided for review. MIP images are provided for review. Automated exposure control, iterative reconstruction, and/or weight based adjustment of the mA/kV was utilized to reduce the radiation dose to as low as reasonably achievable. COMPARISON: PA and  lateral chest yesterday, PA and lateral chest 05/13/2022, and a partial chest CT for cardiac SPECT CT 03/13/2022. CLINICAL HISTORY: Pulmonary embolism (PE) suspected, low to intermediate probability, positive D-dimer. The patient was seen by his pulmonologist and had a chest x-ray and blood work at the office, then was told to come to the emergency room. FINDINGS: PULMONARY ARTERIES: Pulmonary arteries are well opacified for evaluation. No pulmonary embolism. The pulmonary arteries are upper normal in caliber. MEDIASTINUM: There is mild cardiomegaly with left chamber predominance. There is no pericardial effusion. There are left main and patchy 3-vessel coronary artery calcifications, greatest in the LAD. The central pulmonary veins are not dilated. There is atherosclerosis of the aorta and great vessels without stenosis, dissection, or aneurysm. Mediastinal lipomatosis appears similar. There is a patulous esophagus consistent with chronic dysmotility, unchanged. Small hiatal fat hernia. LYMPH NODES: There are shotty subcentimeter lymph nodes in the short axis tracheal chain nodes, nonpathologic by size criteria. There are mildly prominent bilateral hilar lymph nodes up to 1.2 cm, which appear to have been present previously. No other thoracic adenopathy is seen. The axillae are clear. LUNGS AND PLEURA: There are bilateral new minimal pleural effusions. Diffuse bronchial thickening in the lungs with occasional subsegmental small branch bronchial impactions in the basal lower lobes. There is posterior atelectasis in both lungs. No subpleural septal edema is  seen, but there is generalized ground-glass haziness in the upper lobes. This could be due to chronic interstitial lung disease, air trapping or possibly ground-glass edema. Underlying infection is difficult to exclude in the appropriate clinical setting. There are paraseptal and centrilobular emphysematous changes in both upper lobes, seen previously. In the left  upper lobe there are 2 nodules, each measuring 6 mm, with one on series 5 axial image 40 and the other on axial 46. Unknown if these were present in 2023 due to lung windows not having been done with the soft tissue windows of that study. No pneumothorax. UPPER ABDOMEN: There is a 1.3 cm left adrenal myelolipoma, Hounsfield density is -44. No acute upper abdominal findings. SOFT TISSUES AND BONES: There is extensive bridging enthesopathy of the thoracic spine, no acute or other significant osseous findings. Asymmetrically advanced left shoulder degenerative joint disease (DJD). No acute soft tissue abnormality. IMPRESSION: 1. No evidence of pulmonary embolism. Pulmonary arteries are at the upper limit of normal caliber. 2. Generalized ground-glass haziness in the upper lobes, which may reflect air trapping, edema, infection, or chronic interstitial lung disease. 3. Diffuse bronchial wall thickening with occasional subsegmental bronchial impactions and mild dependent atelectasis. 4. Bilateral new minimal pleural effusions. 5. Two left upper lobe solid pulmonary nodules measuring 6 mm each; recommend non-contrast chest CT at 3-6 months and again at 18-24 months, as per Fleischner Society Guidelines. 6. Pulmonary emphysema is an independent risk factor for lung cancer. Recommend consideration for evaluation for a low-dose CT lung cancer screening program. 7. Cardiomegaly and 3-vessel coronary artery disease. 8. Small left adrenal myelolipoma. Electronically signed by: Francis Quam MD 05/04/2024 02:04 AM EST RP Workstation: [id number]     Patient Profile   Christopher Escobar is a 69 y.o. male with a hx of nonobstructive CAD by cath in 2024, HFrEF, PAD involving the aorta and iliac arteries, diabetes with peripheral neuropathy complicated by diabetic ulcer and prior amputations.  Hypertension, hyperlipidemia, prior tobacco use (quit 2008), lumbar fusion, and OSA who is being seen 05/04/2024 for the evaluation of acute  on chronic CHF   Assessment & Plan  Acute respiratory failure Nonischemic cardiomyopathy Respiratory panel negative Initially on supplemental oxygen, now weaned to room air -Concerning for CHF exacerbation - Ejection fraction 35 to 40% in September 2024 -Repeat echo this admission with EF 40 to 45% -Will recommend additional 24 hours IV Lasix  -Entresto  spironolactone  metoprolol  held in the setting of hypotension - continue Jardiance    Nonobstructive coronary artery disease Cardiac catheterization 2024 with mild nonobstructive disease -Troponin negative, no plans for ischemic workup at this time   PAD History of significant atherosclerosis of aorta, iliac vessels History of toe amputations bilaterally   Diabetes with complications A1c slowly improving 10.7 % 4 years ago down to 9.1% 3 years ago down to 6.8 % this admission -Complications including PAD, atherosclerosis, toe amputations    For questions or updates, please contact [facility] Please consult [email or web address] for contact info under       Signed, Elynor Kallenberger, MD  05/05/2024, 3:53 PM    "

## 2024-05-05 NOTE — Care Management Obs Status (Signed)
 MEDICARE OBSERVATION STATUS NOTIFICATION   Patient Details  Name: Christopher Escobar MRN: [id number] Date of Birth: 30-Dec-1955   Medicare Observation Status Notification Given:  Yes    Rhina Kramme 05/05/2024, 11:19 AM

## 2024-05-06 ENCOUNTER — Telehealth: Payer: Self-pay | Admitting: Pharmacist

## 2024-05-06 LAB — MAGNESIUM: Magnesium: 2.6 mg/dL — ABNORMAL HIGH (ref 1.7–2.4)

## 2024-05-06 LAB — BASIC METABOLIC PANEL WITH GFR
Anion gap: 13 (ref 5–15)
BUN: 38 mg/dL — ABNORMAL HIGH (ref 8–23)
CO2: 22 mmol/L (ref 22–32)
Calcium: 9.1 mg/dL (ref 8.9–10.3)
Chloride: 104 mmol/L (ref 98–111)
Creatinine, Ser: 1.23 mg/dL (ref 0.61–1.24)
GFR, Estimated: >60 mL/min
Glucose, Bld: 178 mg/dL — ABNORMAL HIGH (ref 70–99)
Potassium: 4.1 mmol/L (ref 3.5–5.1)
Sodium: 139 mmol/L (ref 135–145)

## 2024-05-06 LAB — BETA-HYDROXYBUTYRIC ACID: Beta-Hydroxybutyric Acid: 0.17 mmol/L (ref 0.05–0.27)

## 2024-05-06 LAB — GLUCOSE, CAPILLARY
Glucose-Capillary: 170 mg/dL — ABNORMAL HIGH (ref 70–99)
Glucose-Capillary: 213 mg/dL — ABNORMAL HIGH (ref 70–99)

## 2024-05-06 MED ORDER — NITROGLYCERIN 0.4 MG SL SUBL: 0.4000 mg | SUBLINGUAL_TABLET | SUBLINGUAL | 0 refills | Status: AC | PRN

## 2024-05-06 MED ORDER — GABAPENTIN 100 MG PO CAPS
100.0000 mg | ORAL_CAPSULE | Freq: Three times a day (TID) | ORAL | Status: DC
  Administered 2024-05-06: 100 mg via ORAL
  Filled 2024-05-06: qty 1

## 2024-05-06 MED ORDER — FUROSEMIDE 40 MG PO TABS
40.0000 mg | ORAL_TABLET | Freq: Every day | ORAL | Status: DC
  Administered 2024-05-06: 40 mg via ORAL
  Filled 2024-05-06: qty 1

## 2024-05-06 MED ORDER — MUSCLE RUB 10-15 % EX CREA: 1.0000 | TOPICAL_CREAM | CUTANEOUS | Status: DC | PRN

## 2024-05-06 MED ORDER — FUROSEMIDE 20 MG PO TABS: 40.0000 mg | ORAL_TABLET | Freq: Every day | ORAL | Status: AC

## 2024-05-06 NOTE — Progress Notes (Signed)
 "  Cardiology Progress Note   Patient Name: Christopher Escobar Date of Encounter: 05/06/2024  Primary Cardiologist: Timothy Gollan, MD  Subjective   Breathing much improved, not entirely at baseline.  Walked downstairs twice yesterday - felt better on the second trip.  No c/p or palps. Cont to have runs of atrial tachycardia. Objective   Inpatient Medications    Scheduled Meds:  aspirin  EC  81 mg Oral Daily   atorvastatin   40 mg Oral q1800   dextromethorphan -guaiFENesin   1 tablet Oral BID   empagliflozin   25 mg Oral Daily   enoxaparin  (LOVENOX ) injection  60 mg Subcutaneous Q24H   insulin  aspart  0-15 Units Subcutaneous TID WC   insulin  aspart  0-5 Units Subcutaneous QHS   insulin  glargine-yfgn  8 Units Subcutaneous QHS   metoprolol  succinate  25 mg Oral Q breakfast   predniSONE   40 mg Oral Q breakfast   tamsulosin   0.4 mg Oral Daily   Continuous Infusions:  PRN Meds: acetaminophen  **OR** acetaminophen , HYDROcodone -acetaminophen , ipratropium-albuterol , nitroGLYCERIN , ondansetron  **OR** ondansetron  (ZOFRAN ) IV   Vital Signs    Vitals:   05/05/24 2345 05/06/24 0413 05/06/24 0500 05/06/24 0748  BP: 102/76 (!) 91/53  (!) 108/58  Pulse: 89 87  86  Resp: 18 18  16   Temp: 97.8 F (36.6 C) 98.1 F (36.7 C)  99.2 F (37.3 C)  TempSrc:      SpO2: 92% 92%  95%  Weight:   113 kg   Height:        Intake/Output Summary (Last 24 hours) at 05/06/2024 0933 Last data filed at 05/05/2024 1916 Gross per 24 hour  Intake 460 ml  Output --  Net 460 ml   Filed Weights   05/03/24 1709 05/06/24 0500  Weight: 119 kg 113 kg    Physical Exam   GEN: Well nourished, well developed, in no acute distress.  HEENT: Grossly normal.  Neck: Supple, no JVD, carotid bruits, or masses. Cardiac: Irreg, no murmurs, rubs, or gallops. No clubbing, cyanosis, edema.  Radials 2+, DP/PT 2+ and equal bilaterally.  Respiratory:  Respirations regular and unlabored, clear to auscultation bilaterally. GI:  Soft, nontender, nondistended, BS + x 4. MS: no deformity or atrophy. Skin: warm and dry, no rash. Neuro:  Strength and sensation are intact. Psych: AAOx3.  Normal affect.  Labs    Chemistry Recent Labs  Lab 05/03/24 1532 05/03/24 1711 05/04/24 0809 05/05/24 0455 05/06/24 0424  NA 138   < > 137 137 139  K 3.9   < > 4.1 4.1 4.1  CL 102   < > 102 103 104  CO2 21*   < > 22 20* 22  GLUCOSE 142*   < > 162* 160* 178*  BUN 23   < > 23 26* 38*  CREATININE 1.25*   < > 1.09 1.06 1.23  CALCIUM  9.4   < > 9.0 8.5* 9.1  PROT 6.8  --   --   --   --   ALBUMIN 3.5  --   --   --   --   AST 28  --   --   --   --   ALT 19  --   --   --   --   ALKPHOS 76  --   --   --   --   BILITOT 0.8  --   --   --   --   GFRNONAA >60   < > >60 >60 >60  ANIONGAP 14   < > 13 14 13    < > = values in this interval not displayed.     Hematology Recent Labs  Lab 05/03/24 1532 05/03/24 1711 05/05/24 0455  WBC 10.1 10.0 8.3  RBC 4.61 4.72 4.64  HGB 14.2 14.6 14.4  HCT 43.7 45.0 43.6  MCV 94.8 95.3 94.0  MCH 30.8 30.9 31.0  MCHC 32.5 32.4 33.0  RDW 15.4 15.2 15.1  PLT 297 295 299    Cardiac Enzymes  Recent Labs  Lab 05/03/24 1711 05/03/24 2342  TRNPT 22* 17    BNP    Component Value Date/Time   BNP 855.5 (H) 05/13/2022 1502    ProBNP    Component Value Date/Time   PROBNP 306.0 (H) 05/05/2024 0455     DDimer  Recent Labs  Lab 05/03/24 1532  DDIMER 1.02*     Lipids  Lab Results  Component Value Date   CHOL 104 05/13/2022   HDL 34 (L) 05/13/2022   LDLCALC 54 05/13/2022   LDLDIRECT 51 05/13/2022   TRIG 78 05/13/2022   CHOLHDL 3.1 05/13/2022    HbA1c  Lab Results  Component Value Date   HGBA1C 6.8 (H) 05/04/2024   Lab Results  Component Value Date   TSH 2.260 05/03/2024     Radiology    CT Angio Chest PE W and/or Wo Contrast Result Date: 05/04/2024 EXAM: CTA of the Chest with contrast for PE 05/04/2024 01:24:17 AM TECHNIQUE: CTA of the chest was performed after  the administration of 75 mL of iohexol  (OMNIPAQUE ) 350 MG/ML injection. Multiplanar reformatted images are provided for review. MIP images are provided for review. Automated exposure control, iterative reconstruction, and/or weight based adjustment of the mA/kV was utilized to reduce the radiation dose to as low as reasonably achievable. COMPARISON: PA and lateral chest yesterday, PA and lateral chest 05/13/2022, and a partial chest CT for cardiac SPECT CT 03/13/2022. CLINICAL HISTORY: Pulmonary embolism (PE) suspected, low to intermediate probability, positive D-dimer. The patient was seen by his pulmonologist and had a chest x-ray and blood work at the office, then was told to come to the emergency room. FINDINGS: PULMONARY ARTERIES: Pulmonary arteries are well opacified for evaluation. No pulmonary embolism. The pulmonary arteries are upper normal in caliber. MEDIASTINUM: There is mild cardiomegaly with left chamber predominance. There is no pericardial effusion. There are left main and patchy 3-vessel coronary artery calcifications, greatest in the LAD. The central pulmonary veins are not dilated. There is atherosclerosis of the aorta and great vessels without stenosis, dissection, or aneurysm. Mediastinal lipomatosis appears similar. There is a patulous esophagus consistent with chronic dysmotility, unchanged. Small hiatal fat hernia. LYMPH NODES: There are shotty subcentimeter lymph nodes in the short axis tracheal chain nodes, nonpathologic by size criteria. There are mildly prominent bilateral hilar lymph nodes up to 1.2 cm, which appear to have been present previously. No other thoracic adenopathy is seen. The axillae are clear. LUNGS AND PLEURA: There are bilateral new minimal pleural effusions. Diffuse bronchial thickening in the lungs with occasional subsegmental small branch bronchial impactions in the basal lower lobes. There is posterior atelectasis in both lungs. No subpleural septal edema is seen,  but there is generalized ground-glass haziness in the upper lobes. This could be due to chronic interstitial lung disease, air trapping or possibly ground-glass edema. Underlying infection is difficult to exclude in the appropriate clinical setting. There are paraseptal and centrilobular emphysematous changes in both upper lobes, seen previously. In the left upper  lobe there are 2 nodules, each measuring 6 mm, with one on series 5 axial image 40 and the other on axial 46. Unknown if these were present in 2023 due to lung windows not having been done with the soft tissue windows of that study. No pneumothorax. UPPER ABDOMEN: There is a 1.3 cm left adrenal myelolipoma, Hounsfield density is -44. No acute upper abdominal findings. SOFT TISSUES AND BONES: There is extensive bridging enthesopathy of the thoracic spine, no acute or other significant osseous findings. Asymmetrically advanced left shoulder degenerative joint disease (DJD). No acute soft tissue abnormality. IMPRESSION: 1. No evidence of pulmonary embolism. Pulmonary arteries are at the upper limit of normal caliber. 2. Generalized ground-glass haziness in the upper lobes, which may reflect air trapping, edema, infection, or chronic interstitial lung disease. 3. Diffuse bronchial wall thickening with occasional subsegmental bronchial impactions and mild dependent atelectasis. 4. Bilateral new minimal pleural effusions. 5. Two left upper lobe solid pulmonary nodules measuring 6 mm each; recommend non-contrast chest CT at 3-6 months and again at 18-24 months, as per Fleischner Society Guidelines. 6. Pulmonary emphysema is an independent risk factor for lung cancer. Recommend consideration for evaluation for a low-dose CT lung cancer screening program. 7. Cardiomegaly and 3-vessel coronary artery disease. 8. Small left adrenal myelolipoma. Electronically signed by: Francis Quam MD 05/04/2024 02:04 AM EST RP Workstation: [id number]   DG Chest 2 View Result  Date: 05/03/2024 CLINICAL DATA:  Shortness of breath. EXAM: CHEST - 2 VIEW COMPARISON:  May 13, 2022 FINDINGS: The heart size and mediastinal contours are within normal limits. There is pulmonary edema. Patchy opacity of left lung base is identified, underlying pneumonia is not excluded. There is no pleural effusion. The visualized skeletal structures are stable. IMPRESSION: Pulmonary edema. Patchy consolidation of left lung base, developing pneumonia is not excluded. Electronically Signed   By: Craig Farr M.D.   On: 05/03/2024 15:59     Telemetry    Sinus rhythm, freq PACs and PVCs w/ runs of atrial tachycardia up to 150 - Personally Reviewed  Cardiac Studies   Cardiac Catheterization  2.9.2024  Diagnostic Dominance: Right  _____________  2D Echocardiogram 2.5.2026  1. Left ventricular ejection fraction, by estimation, is 40 to 45%. The  left ventricle has mildly decreased function. The left ventricle has no  regional wall motion abnormalities. There is moderate left ventricular  hypertrophy. Left ventricular  diastolic parameters are indeterminate.   2. Right ventricular systolic function is normal. The right ventricular  size is normal.   3. The mitral valve is normal in structure. No evidence of mitral valve  regurgitation. No evidence of mitral stenosis.   4. The aortic valve is normal in structure. Aortic valve regurgitation is  not visualized. No aortic stenosis is present.   5. The inferior vena cava is normal in size with greater than 50%  respiratory variability, suggesting right atrial pressure of 3 mmHg.  _____________   Patient Profile     69 y.o. male with a history of nonobstructive CAD by catheterization 2024, chronic HFrEF, hypertension, hyperlipidemia, remote tobacco abuse (quit 2008), peripheral arterial disease involving the aorta and iliac arteries, diabetes, peripheral neuropathy, diabetic ulcer status post prior amputations, prior lumbar fusion, and  sleep apnea, who was admitted on February third with acute on chronic heart failure.  Assessment & Plan    1.  Acute on chronic heart failure with midrange ejection fraction: EF previously 35 to 40% by echo in September 2024.  Admitted with respiratory  failure and volume overload on February 3.  Respiratory panel negative.  Echo this admission with an EF of 40 to 45% without regional wall motion abnormalities, normal RV function.  He notes good response to intravenous Lasix .  Not saving urine but noted good output yesterday.  Breathing closer to baseline, still notes some upper airway congestion.  BUN/Creat up slightly this AM - 38/1.23.  Lungs clear, no JVD or lower ext edema.  Will transition to lasix  40 PO daily (prior home dose was 20 daily).  Pressures remain soft and GDMT currently limited to metoprolol  succinate and Jardiance .  Prior home doses of spironolactone  and Entresto  remain on hold.  Will look to add back in the outpatient setting as feasible.  2.  Atrial tachycardia/SVT: Patient with frequent PACs and brief runs of atrial tachycardia.  He is asymptomatic.  Continue current beta-blocker dose as soft blood pressures limit further titration.  Potassium and magnesium  are normal this morning.  TSH was normal earlier this admission.  3.  Nonobstructive CAD: Diagnostic catheterization 2024 showed mild nonobstructive disease.  Troponin trend unimpressive (22-17).  Denies chest pain.  Continue aspirin  and statin therapy.  4.  Acute respiratory failure/COPD: Multifactorial in the setting of heart failure and possible COPD exacerbation.  Still notes some upper airway congestion.  On steroids per medicine team.  5.  Peripheral arterial disease: Significant atherosclerosis of the aorta and iliac vessels.  Prior history of diabetic foot ulcer status post toe amputations bilaterally.  Remains on aspirin  and statin.  6.  Diabetes mellitus: A1c 6.8 this admission.  Insulin  management per medicine  team.  Signed, Lonni Meager, NP  05/06/2024, 9:33 AM    For questions or updates, please contact   Please consult [email or web address] for contact info under Cardiology/STEMI.  "

## 2024-05-06 NOTE — Telephone Encounter (Signed)
 Heart Failure Stewardship Pharmacy Note  PCP: Honore Sherryle RAMAN, MD PCP-Cardiologist: Evalene Lunger, MD  HPI: Christopher Escobar is a 69 y.o. male with Nonobstructive CAD, PAD with prior amputations, HTN, DM, OSA not on CPAP, CHF who presented with dyspnea on exertion and chest discomfort. On admission, proBNP was 469, HS-troponin was 22. Chest x-ray noted pulmonary edema.    Pertinent cardiac history: Stress test in 02/2022 noted no ischemia, but LVEF of 27%. TTE 03/2022 showed LVEF of 45-50% with G2DD. LHC in 05/2022 noted mild non-obstructive CAD. LVEF at that time was 35-40%. RHC showed CI of 2.43, RA of 4, PW of 17. TTE 11/2022 noted LVEF of 35-40%. TTE this admission pending.  Pertinent Lab Values: Creatinine, Ser  Date Value Ref Range Status  05/06/2024 1.23 0.61 - 1.24 mg/dL Final   BUN  Date Value Ref Range Status  05/06/2024 38 (H) 8 - 23 mg/dL Final   Potassium  Date Value Ref Range Status  05/06/2024 4.1 3.5 - 5.1 mmol/L Final   Sodium  Date Value Ref Range Status  05/06/2024 139 135 - 145 mmol/L Final   B Natriuretic Peptide  Date Value Ref Range Status  05/13/2022 855.5 (H) 0.0 - 100.0 pg/mL Final    Comment:    Performed at [facility], [address], [place], [place] [address]   Magnesium   Date Value Ref Range Status  05/06/2024 2.6 (H) 1.7 - 2.4 mg/dL Final    Comment:    Performed at [facility], [address], [place], [place] [address]   Hgb A1c MFr Bld  Date Value Ref Range Status  05/04/2024 6.8 (H) 4.8 - 5.6 % Final    Comment:    (NOTE) Diagnosis of Diabetes The following HbA1c ranges recommended by the American Diabetes Association (ADA) may be used as an aid in the diagnosis of diabetes mellitus.  Hemoglobin             Suggested A1C NGSP%              Diagnosis  <5.7                   Non Diabetic  5.7-6.4                Pre-Diabetic  >6.4                   Diabetic  <7.0                   Glycemic control  for                       adults with diabetes.     TSH  Date Value Ref Range Status  05/03/2024 2.260 0.350 - 4.500 uIU/mL Final    Comment:    Performed at [facility], [address] [place], [place] [address]   Current Heart Failure Medications:  Loop diuretic: furosemide  40 mg daily Beta-Blocker: metoprolol  succinate 25 mg daily ACEI/ARB/ARNI: none MRA: none SGLT2i: Jardiance  10 mg daily Other: none  Prior to admission Heart Failure Medications:  Loop diuretic: furosemide  20 mg prn Beta-Blocker: metoprolol  succinate 25 mg daily ACEI/ARB/ARNI: Entresto  held for hypotension MRA: spironolactone  12.5 mg daily SGLT2i: Jardiance  25 mg daily Other: none  Assessment: 1. Acute on chronic systolic heart failure (LVEF in 2024 was 35-40%)  , due to NICM. NYHA class III symptoms.  -Symptoms: Reports feeling improved since admission. Appetite  is fair, dyspnea is improving, denies orthopnea. Thought that COPD may be contributing to symptoms. -Volume: Does appear to be mildly hypervolemic on exam. Creatinine stable. Symptoms are improving slowly. Not currently on furosemide .   -Hemodynamics: BP is lower end of normal, but asymptomatic. HR is 80s. -BB: Continue metoprolol  succinate 25 mg daily. Would prefer optimization of other classes prior to dose titration.. -ACEI/ARB/ARNI: Agree with holding Entresto  at this time. Can consider resuming when BP improves. -MRA: BP is lower end of normal. Given low impact on BP, could consider resuming home spironolactone  12.5 mg daily if SBP remains >105 mmHg. -SGLT2i: SGLT2i held on admission. Ketones were very slightly eleavted, not meeting definition of DKA. Continue Jardiacne 10 mg daily.  Plan: 1) Medication changes recommended at this time: - Can consider adding back home spironolactone  prior to discharge if SBP remains >105 mmHg given limited impact on BP.  2) Patient assistance: -Pending  3) Education: - Patient has been  educated on current HF medications and potential additions to HF medication regimen - Patient verbalizes understanding that over the next few months, these medication doses may change and more medications may be added to optimize HF regimen - Patient has been educated on basic disease state pathophysiology and goals of therapy  Please do not hesitate to reach out with questions or concerns,  Kino Dunsworth, PharmD, CPP, BCPS, [facility] Heart Failure Pharmacist  Phone - [phone] 05/06/2024 8:16 AM

## 2024-05-06 NOTE — Inpatient Diabetes Management (Signed)
 Inpatient Diabetes Program Recommendations  AACE/ADA: New Consensus Statement on Inpatient Glycemic Control   Target Ranges:  Prepandial:   less than 140 mg/dL      Peak postprandial:   less than 180 mg/dL (1-2 hours)      Critically ill patients:  140 - 180 mg/dL    Latest Reference Range & Units 05/05/24 08:26 05/05/24 11:54 05/05/24 15:43 05/05/24 20:05 05/06/24 08:13  Glucose-Capillary 70 - 99 mg/dL 822 (H) 774 (H) 767 (H) 256 (H) 170 (H)   Review of Glycemic Control  Diabetes history: DM2 Outpatient Diabetes medications: Jardiance  25 mg daily, Novolog  15 units BID with meals, switching to 70/30 15 units BID once Novolog  is used up, Ozempic 2 mg Qweek, Tresiba  25 units QHS Current orders for Inpatient glycemic control: Semglee  8 units at bedtime, Novolog  0-15 units TID with meals, Novolog  0-5 units at bedtime, Jardiance  25 mg daily; Prednisone  40 mg QAM  Inpatient Diabetes Program Recommendations:    Insulin : If steroids are continued, please consider ordering Novolog  4 units TID with meals for meal coverage if patient eats at least 50% of meals.  Thanks, Earnie Gainer, RN, MSN, CDCES Diabetes Coordinator Inpatient Diabetes Program [phone] (Team Pager from 8am to 5pm)

## 2024-05-06 NOTE — Discharge Summary (Signed)
 " Physician Discharge Summary   Patient: Christopher Escobar MRN: [id number] DOB: June 05, 1955  Admit date:     05/03/2024  Discharge date: 05/06/2024   Discharge Physician: Alban Pepper   PCP: McCuddy, Michaela S, MD   Recommendations at discharge:    Consider PFTs outpatient, noted to have emphysematous changes on ct chest  2 Left upper lobe pulmonary nodules 6 mm each, patient will need noncontrasted CT chest at 36 months and again at 18 to 24 months per radiology  Discharge Diagnoses: Principal Problem:   Acute on chronic HFrEF (heart failure with reduced ejection fraction) (HCC) Active Problems:   Chest pain   High anion gap metabolic acidosis   Type 2 diabetes mellitus with hyperlipidemia (HCC)   Hypotension   CHF exacerbation (HCC)   Palpitations   Sleep apnea  Resolved Problems:   * No resolved hospital problems. *  Hospital Course: Per H&P HPI     Christopher Escobar is a 69 y.o. male with medical history significant for Nonobstructive CAD, PAD with prior amputations, HTN, DM, OSA not on CPAP,, HFrEF(EF 35-40%), seen earlier in the day by his cardiologist with a week of dyspnea on exertion associated with chest discomfort, being admitted with CHF exacerbation associated with chest pain, likely nonanginal.  He presents with persistent complaint voiced earlier to cardiology of shortness of breath and chest discomfort.  At his appointment earlier in the day on 05/03/2024 his Lasix  dose was doubled for 3 days and he was continued on his other GDMT except for Entresto  which was stopped due to soft blood pressure of 92/60.  He also complained of palpitations, EKG showed PVCs so he 1 week Holter was ordered as well as an echo.  He was given a 1 month follow-up. In the ED mildly tachypneic intermittently hypotensive to 96/56, with O2 sat in the mid 90s on room air. Troponin 22-17 with proBNP 364. BMP with glucose 135, anion gap 16 and bicarb 19 CBC WNL EKG showed undetermined rhythm at 98  with RBBB and PVCs CTA chest PE protocol negative for PE but showed minimal pleural effusion, generalized ground glass haziness upper lobes with varying differentials--please see report   Patient treated with chewable aspirin  324 mg given a dose of Lasix  40 mg as well as nitroglycerin  sublingual   Admission requested   Assessment and Plan: * Acute on chronic HFrEF (heart failure with reduced ejection fraction) (HCC) Presents with exertional dyspnea, proBNP 364, minimal pleural effusion on CTA chest with possible airspace disease upper lobes, no PE. Resp viral panel negative TTE 11/2022 EF 35 to 40% TTE this hospitalization LVEF 40% Patient was diuresed with IV Lasix  and his shortness of breath improved. Initiation/continuation of GDMT was limited due to low normal blood pressure. Patient will discharge on metoprolol  succinate 25 mg, Farxiga, and daily Lasix  40 mg. Spironolactone  and Entresto  were held he has close follow-up with cardiology outpatient.   Chest pain Resolved  History of nonobstructive CAD on [facility] 2024 PAD with prior amputation EKG without evidence of acute ischemaTroponin with non-ACS trend 22-17 Continue aspirin  and Lipitor   High anion gap metabolic acidosis Resolved AG 14, Bicarb 20 Will repeat BMP in the AM    Sleep apnea Does not use CPAP. Encouraged patient to get sleep study and resume CPAP.    Type 2 diabetes mellitus with hyperlipidemia (HCC) A1c 6.8 patient will discharge on his home regimen.   Class 2 Obesity  BMI 36 Complicates care.   Emphysematous changes  noted on CT of the chest. Encourage patient to get PFTs outpatient  Incidentally discovered left upper lobe pulmonary nodules  Two left upper lobe solid pulmonary nodules measuring 6 mm each; recommend non-contrast chest CT at 3-6 months and again at 18-24 months, as per Fleischner Society Guidelines.     Consultants: Cardiology  Procedures performed: None  Disposition: Home Diet  recommendation:  Discharge Diet Orders (From admission, onward)     Start     Ordered   05/06/24 0000  Diet - low sodium heart healthy        05/06/24 1500           Cardiac diet DISCHARGE MEDICATION: Allergies as of 05/06/2024   No Known Allergies      Medication List     PAUSE taking these medications    Entresto  24-26 MG Wait to take this until your doctor or other care provider tells you to start again. Generic drug: sacubitril-valsartan Take 1 tablet by mouth 2 (two) times daily.   spironolactone  25 MG tablet Wait to take this until your doctor or other care provider tells you to start again. Commonly known as: Aldactone  Take 0.5 tablets (12.5 mg total) by mouth daily.   UNABLE TO FIND Wait to take this until your doctor or other care provider tells you to start again. Med Name: Trimix (30/1/50)-(Pap/Phent/PGE)  Test Dose  1ml vial   Qty #3 Refills 0  [facility] [phone] Fax [phone]       STOP taking these medications    NovoLOG  FlexPen 100 UNIT/ML FlexPen Generic drug: insulin  aspart       TAKE these medications    acetaminophen  325 MG tablet Commonly known as: TYLENOL  Take 2 tablets (650 mg total) by mouth every 6 (six) hours as needed for mild pain (or Fever >/= 101).   albuterol  108 (90 Base) MCG/ACT inhaler Commonly known as: VENTOLIN  HFA Inhale 2 puffs into the lungs every 6 (six) hours as needed for wheezing or shortness of breath.   aspirin  EC 81 MG tablet Take 1 tablet (81 mg total) by mouth daily. Swallow whole.   atorvastatin  40 MG tablet Commonly known as: LIPITOR Take 40 mg by mouth daily at 6 PM.   folic acid 1 MG tablet Commonly known as: FOLVITE Take 1 mg by mouth daily.   FreeStyle Libre 3 Plus Sensor Misc every 14 (fourteen) days.   furosemide  20 MG tablet Commonly known as: LASIX  Take 2 tablets (40 mg total) by mouth daily. What changed:  how much to take when to take this reasons to take  this   Jardiance  25 MG Tabs tablet Generic drug: empagliflozin  Take 1 tablet (25 mg total) by mouth daily.   methotrexate 2.5 MG tablet Commonly known as: RHEUMATREX Take 12.5 mg by mouth once a week. Every Monday   metoprolol  succinate 25 MG 24 hr tablet Commonly known as: TOPROL -XL Take 1 tablet (25 mg total) by mouth every morning.   multivitamin-lutein  Caps capsule Take 1 capsule by mouth 2 (two) times daily.   nitroGLYCERIN  0.4 MG SL tablet Commonly known as: NITROSTAT  Place 1 tablet (0.4 mg total) under the tongue every 5 (five) minutes as needed for chest pain.   NovoLOG  Mix 70/30 FlexPen (70-30) 100 UNIT/ML FlexPen Generic drug: insulin  aspart protamine - aspart Inject 15 Units into the skin 2 (two) times daily with a meal.   oxyCODONE  15 MG immediate release tablet Commonly known as: ROXICODONE  Take 15 mg by mouth  every 6 (six) hours as needed.   Ozempic (2 MG/DOSE) 8 MG/3ML Sopn Generic drug: Semaglutide (2 MG/DOSE) Inject 2 mg into the skin once a week. Every Monday.   potassium chloride  SA 20 MEQ tablet Commonly known as: KLOR-CON  M TAKE 1 TABLET(20 MEQ) BY MOUTH DAILY   pregabalin  75 MG capsule Commonly known as: LYRICA  Take 75 mg by mouth 2 (two) times daily.   senna 8.6 MG Tabs tablet Commonly known as: SENOKOT Take 2 tablets (17.2 mg total) by mouth at bedtime.   tadalafil  20 MG tablet Commonly known as: CIALIS  Take 1 tablet (20 mg total) by mouth daily as needed (take 45 minutes prior to sexual activity).   tamsulosin  0.4 MG Caps capsule Commonly known as: FLOMAX  Take 1 capsule (0.4 mg total) by mouth daily.   Tresiba  FlexTouch 100 UNIT/ML FlexTouch Pen Generic drug: insulin  degludec Inject 25 Units into the skin at bedtime.        Follow-up Information     [facility]. Go on 05/18/2024.   Specialty: Cardiology Why: Hospital follow-up. 05/18/24 @ 11:30 AM Please bring all medications to follow-up  appointment [facility], [address], Second Floor Free Valet Parking at the door. Contact information: [address]  [place]  [address] [phone]               Discharge Exam: Fredricka Weights   05/03/24 1709 05/06/24 0500  Weight: 119 kg 113 kg   Physical Exam  Constitutional: In no distress.  Cardiovascular: Normal rate, regular rhythm. No lower extremity edema  Pulmonary: Non labored breathing on room air, no wheezing or rales.   Abdominal: Soft. Non distended and non tender Musculoskeletal: Normal range of motion.     Neurological: Alert and oriented to person, place, and time. Non focal  Skin: Skin is warm and dry.    Condition at discharge: good  The results of significant diagnostics from this hospitalization (including imaging, microbiology, ancillary and laboratory) are listed below for reference.   Imaging Studies: ECHOCARDIOGRAM COMPLETE Result Date: 05/05/2024    ECHOCARDIOGRAM REPORT   Patient Name:   WOODARD PERRELL Date of Exam: 05/05/2024 Medical Rec #:  [id number]        Height:       71.0 in Accession #:    [id number]       Weight:       262.3 lb Date of Birth:  [birth year]        BSA:          2.366 m Patient Age:    68 years         BP:           94/57 mmHg Patient Gender: M                HR:           84 bpm. Exam Location:  [facility] Procedure: 2D Echo, Cardiac Doppler and Color Doppler (Both Spectral and Color            Flow Doppler were utilized during procedure). Indications:     Congestive heart failure I50.9  History:         Patient has prior history of Echocardiogram examinations, most                  recent 12/10/2022. CAD; Risk Factors:Diabetes.  Sonographer:     Christopher Furnace Referring Phys:  [id number] [facility] L CAREY Diagnosing  Phys: Evalene Lunger MD  Sonographer Comments: Technically challenging study due to limited acoustic windows, no apical window and suboptimal subcostal window. IMPRESSIONS  1. Left ventricular  ejection fraction, by estimation, is 40 to 45%. The left ventricle has mildly decreased function. The left ventricle has no regional wall motion abnormalities. There is moderate left ventricular hypertrophy. Left ventricular diastolic parameters are indeterminate.  2. Right ventricular systolic function is normal. The right ventricular size is normal.  3. The mitral valve is normal in structure. No evidence of mitral valve regurgitation. No evidence of mitral stenosis.  4. The aortic valve is normal in structure. Aortic valve regurgitation is not visualized. No aortic stenosis is present.  5. The inferior vena cava is normal in size with greater than 50% respiratory variability, suggesting right atrial pressure of 3 mmHg. FINDINGS  Left Ventricle: Left ventricular ejection fraction, by estimation, is 40 to 45%. The left ventricle has mildly decreased function. The left ventricle has no regional wall motion abnormalities. Strain was performed and the global longitudinal strain is indeterminate. The left ventricular internal cavity size was normal in size. There is moderate left ventricular hypertrophy. Left ventricular diastolic parameters are indeterminate. Right Ventricle: The right ventricular size is normal. No increase in right ventricular wall thickness. Right ventricular systolic function is normal. Left Atrium: Left atrial size was normal in size. Right Atrium: Right atrial size was normal in size. Pericardium: There is no evidence of pericardial effusion. Mitral Valve: The mitral valve is normal in structure. No evidence of mitral valve regurgitation. No evidence of mitral valve stenosis. Tricuspid Valve: The tricuspid valve is normal in structure. Tricuspid valve regurgitation is not demonstrated. No evidence of tricuspid stenosis. Aortic Valve: The aortic valve is normal in structure. Aortic valve regurgitation is not visualized. No aortic stenosis is present. Pulmonic Valve: The pulmonic valve was normal  in structure. Pulmonic valve regurgitation is not visualized. No evidence of pulmonic stenosis. Aorta: The aortic root is normal in size and structure. Venous: The inferior vena cava is normal in size with greater than 50% respiratory variability, suggesting right atrial pressure of 3 mmHg. IAS/Shunts: No atrial level shunt detected by color flow Doppler. Additional Comments: 3D was performed not requiring image post processing on an independent workstation and was indeterminate.  LEFT VENTRICLE PLAX 2D LVIDd:         4.64 cm LVIDs:         3.32 cm LV PW:         1.55 cm LV IVS:        1.94 cm LVOT diam:     2.20 cm LVOT Area:     3.80 cm  LEFT ATRIUM         Index LA diam:    3.10 cm 1.31 cm/m   AORTA Ao Root diam: 2.70 cm  SHUNTS Systemic Diam: 2.20 cm Evalene Lunger MD Electronically signed by Evalene Lunger MD Signature Date/Time: 05/05/2024/12:32:21 PM    Final    CT Angio Chest PE W and/or Wo Contrast Result Date: 05/04/2024 EXAM: CTA of the Chest with contrast for PE 05/04/2024 01:24:17 AM TECHNIQUE: CTA of the chest was performed after the administration of 75 mL of iohexol  (OMNIPAQUE ) 350 MG/ML injection. Multiplanar reformatted images are provided for review. MIP images are provided for review. Automated exposure control, iterative reconstruction, and/or weight based adjustment of the mA/kV was utilized to reduce the radiation dose to as low as reasonably achievable. COMPARISON: PA and lateral chest yesterday, PA and  lateral chest 05/13/2022, and a partial chest CT for cardiac SPECT CT 03/13/2022. CLINICAL HISTORY: Pulmonary embolism (PE) suspected, low to intermediate probability, positive D-dimer. The patient was seen by his pulmonologist and had a chest x-ray and blood work at the office, then was told to come to the emergency room. FINDINGS: PULMONARY ARTERIES: Pulmonary arteries are well opacified for evaluation. No pulmonary embolism. The pulmonary arteries are upper normal in caliber. MEDIASTINUM:  There is mild cardiomegaly with left chamber predominance. There is no pericardial effusion. There are left main and patchy 3-vessel coronary artery calcifications, greatest in the LAD. The central pulmonary veins are not dilated. There is atherosclerosis of the aorta and great vessels without stenosis, dissection, or aneurysm. Mediastinal lipomatosis appears similar. There is a patulous esophagus consistent with chronic dysmotility, unchanged. Small hiatal fat hernia. LYMPH NODES: There are shotty subcentimeter lymph nodes in the short axis tracheal chain nodes, nonpathologic by size criteria. There are mildly prominent bilateral hilar lymph nodes up to 1.2 cm, which appear to have been present previously. No other thoracic adenopathy is seen. The axillae are clear. LUNGS AND PLEURA: There are bilateral new minimal pleural effusions. Diffuse bronchial thickening in the lungs with occasional subsegmental small branch bronchial impactions in the basal lower lobes. There is posterior atelectasis in both lungs. No subpleural septal edema is seen, but there is generalized ground-glass haziness in the upper lobes. This could be due to chronic interstitial lung disease, air trapping or possibly ground-glass edema. Underlying infection is difficult to exclude in the appropriate clinical setting. There are paraseptal and centrilobular emphysematous changes in both upper lobes, seen previously. In the left upper lobe there are 2 nodules, each measuring 6 mm, with one on series 5 axial image 40 and the other on axial 46. Unknown if these were present in 2023 due to lung windows not having been done with the soft tissue windows of that study. No pneumothorax. UPPER ABDOMEN: There is a 1.3 cm left adrenal myelolipoma, Hounsfield density is -44. No acute upper abdominal findings. SOFT TISSUES AND BONES: There is extensive bridging enthesopathy of the thoracic spine, no acute or other significant osseous findings. Asymmetrically  advanced left shoulder degenerative joint disease (DJD). No acute soft tissue abnormality. IMPRESSION: 1. No evidence of pulmonary embolism. Pulmonary arteries are at the upper limit of normal caliber. 2. Generalized ground-glass haziness in the upper lobes, which may reflect air trapping, edema, infection, or chronic interstitial lung disease. 3. Diffuse bronchial wall thickening with occasional subsegmental bronchial impactions and mild dependent atelectasis. 4. Bilateral new minimal pleural effusions. 5. Two left upper lobe solid pulmonary nodules measuring 6 mm each; recommend non-contrast chest CT at 3-6 months and again at 18-24 months, as per Fleischner Society Guidelines. 6. Pulmonary emphysema is an independent risk factor for lung cancer. Recommend consideration for evaluation for a low-dose CT lung cancer screening program. 7. Cardiomegaly and 3-vessel coronary artery disease. 8. Small left adrenal myelolipoma. Electronically signed by: Francis Quam MD 05/04/2024 02:04 AM EST RP Workstation: [id number]   DG Chest 2 View Result Date: 05/03/2024 CLINICAL DATA:  Shortness of breath. EXAM: CHEST - 2 VIEW COMPARISON:  May 13, 2022 FINDINGS: The heart size and mediastinal contours are within normal limits. There is pulmonary edema. Patchy opacity of left lung base is identified, underlying pneumonia is not excluded. There is no pleural effusion. The visualized skeletal structures are stable. IMPRESSION: Pulmonary edema. Patchy consolidation of left lung base, developing pneumonia is not excluded. Electronically Signed  By: Craig Farr M.D.   On: 05/03/2024 15:59    Microbiology: Results for orders placed or performed during the hospital encounter of 05/03/24  Respiratory (~20 pathogens) panel by PCR     Status: None   Collection Time: 05/04/24 12:52 PM   Specimen: Nasopharyngeal Swab; Respiratory  Result Value Ref Range Status   Adenovirus NOT DETECTED NOT DETECTED Final   Coronavirus 229E  NOT DETECTED NOT DETECTED Final    Comment: (NOTE) The Coronavirus on the Respiratory Panel, DOES NOT test for the novel  Coronavirus (2019 nCoV)    Coronavirus HKU1 NOT DETECTED NOT DETECTED Final   Coronavirus NL63 NOT DETECTED NOT DETECTED Final   Coronavirus OC43 NOT DETECTED NOT DETECTED Final   Metapneumovirus NOT DETECTED NOT DETECTED Final   Rhinovirus / Enterovirus NOT DETECTED NOT DETECTED Final   Influenza A NOT DETECTED NOT DETECTED Final   Influenza B NOT DETECTED NOT DETECTED Final   Parainfluenza Virus 1 NOT DETECTED NOT DETECTED Final   Parainfluenza Virus 2 NOT DETECTED NOT DETECTED Final   Parainfluenza Virus 3 NOT DETECTED NOT DETECTED Final   Parainfluenza Virus 4 NOT DETECTED NOT DETECTED Final   Respiratory Syncytial Virus NOT DETECTED NOT DETECTED Final   Bordetella pertussis NOT DETECTED NOT DETECTED Final   Bordetella Parapertussis NOT DETECTED NOT DETECTED Final   Chlamydophila pneumoniae NOT DETECTED NOT DETECTED Final   Mycoplasma pneumoniae NOT DETECTED NOT DETECTED Final    Comment: Performed at [facility], [address], [place], [place] [address]    Labs: CBC: Recent Labs  Lab 05/03/24 1532 05/03/24 1711 05/04/24 0809 05/05/24 0455  WBC 10.1 10.0  --  8.3  NEUTROABS  --   --  7.1 6.6  HGB 14.2 14.6  --  14.4  HCT 43.7 45.0  --  43.6  MCV 94.8 95.3  --  94.0  PLT 297 295  --  299   Basic Metabolic Panel: Recent Labs  Lab 05/03/24 1532 05/03/24 1711 05/04/24 0809 05/05/24 0455 05/06/24 0424  NA 138 137 137 137 139  K 3.9 3.8 4.1 4.1 4.1  CL 102 103 102 103 104  CO2 21* 19* 22 20* 22  GLUCOSE 142* 135* 162* 160* 178*  BUN 23 23 23  26* 38*  CREATININE 1.25* 1.13 1.09 1.06 1.23  CALCIUM  9.4 9.4 9.0 8.5* 9.1  MG  --   --  2.2 2.4 2.6*   Liver Function Tests: Recent Labs  Lab 05/03/24 1532  AST 28  ALT 19  ALKPHOS 76  BILITOT 0.8  PROT 6.8  ALBUMIN 3.5   CBG: Recent Labs  Lab 05/05/24 1154 05/05/24 1543  05/05/24 2005 05/06/24 0813 05/06/24 1159  GLUCAP 225* 232* 256* 170* 213*    Discharge time spent: greater than 30 minutes.  Signed: Alban Pepper, MD [facility] 05/06/2024 "

## 2024-05-06 NOTE — Telephone Encounter (Signed)
 Duplicate

## 2024-05-12 ENCOUNTER — Ambulatory Visit: Admitting: Medical

## 2024-05-17 ENCOUNTER — Ambulatory Visit

## 2024-05-18 ENCOUNTER — Ambulatory Visit: Admitting: Family

## 2024-05-31 ENCOUNTER — Ambulatory Visit: Admitting: Medical

## 2024-06-14 ENCOUNTER — Ambulatory Visit: Admitting: Urology

## 2024-06-21 ENCOUNTER — Ambulatory Visit: Admitting: Urology

## 2024-06-22 ENCOUNTER — Ambulatory Visit: Admitting: Urology
